# Patient Record
Sex: Male | Born: 1965 | Race: Black or African American | Hispanic: No | Marital: Married | Smoking: Current every day smoker
Health system: Southern US, Community
[De-identification: ages and names within clinical notes are randomized; demographics above are authoritative.]

## PROBLEM LIST (undated history)

## (undated) DIAGNOSIS — E119 Type 2 diabetes mellitus without complications: Secondary | ICD-10-CM

## (undated) DIAGNOSIS — E7800 Pure hypercholesterolemia, unspecified: Secondary | ICD-10-CM

## (undated) DIAGNOSIS — I10 Essential (primary) hypertension: Secondary | ICD-10-CM

## (undated) DIAGNOSIS — R1013 Epigastric pain: Secondary | ICD-10-CM

## (undated) DIAGNOSIS — M109 Gout, unspecified: Secondary | ICD-10-CM

## (undated) HISTORY — DX: Epigastric pain: R10.13

## (undated) HISTORY — PX: KNEE SURGERY: SHX244

## (undated) HISTORY — DX: Gout, unspecified: M10.9

## (undated) HISTORY — DX: Pure hypercholesterolemia, unspecified: E78.00

---

## 2004-09-09 ENCOUNTER — Other Ambulatory Visit: Payer: Self-pay

## 2004-09-09 ENCOUNTER — Emergency Department: Payer: Self-pay | Admitting: Emergency Medicine

## 2006-10-15 ENCOUNTER — Emergency Department: Payer: Self-pay | Admitting: Emergency Medicine

## 2007-06-16 ENCOUNTER — Emergency Department: Payer: Self-pay | Admitting: Emergency Medicine

## 2010-02-15 ENCOUNTER — Ambulatory Visit: Payer: Self-pay | Admitting: Family Medicine

## 2011-02-10 ENCOUNTER — Emergency Department: Payer: Self-pay | Admitting: Emergency Medicine

## 2015-11-30 ENCOUNTER — Ambulatory Visit: Payer: Self-pay | Admitting: Family Medicine

## 2015-12-02 DIAGNOSIS — E7800 Pure hypercholesterolemia, unspecified: Secondary | ICD-10-CM

## 2015-12-02 HISTORY — DX: Pure hypercholesterolemia, unspecified: E78.00

## 2015-12-25 ENCOUNTER — Ambulatory Visit: Payer: Self-pay | Admitting: Family Medicine

## 2016-01-08 ENCOUNTER — Ambulatory Visit: Payer: Self-pay | Admitting: Family Medicine

## 2016-04-09 DIAGNOSIS — R1032 Left lower quadrant pain: Secondary | ICD-10-CM | POA: Diagnosis not present

## 2016-04-09 DIAGNOSIS — F17200 Nicotine dependence, unspecified, uncomplicated: Secondary | ICD-10-CM | POA: Diagnosis not present

## 2016-04-09 DIAGNOSIS — R000 Tachycardia, unspecified: Secondary | ICD-10-CM | POA: Diagnosis not present

## 2016-04-09 DIAGNOSIS — L0231 Cutaneous abscess of buttock: Secondary | ICD-10-CM | POA: Diagnosis not present

## 2016-04-10 ENCOUNTER — Ambulatory Visit (INDEPENDENT_AMBULATORY_CARE_PROVIDER_SITE_OTHER): Payer: BLUE CROSS/BLUE SHIELD | Admitting: Family Medicine

## 2016-04-10 ENCOUNTER — Encounter: Payer: Self-pay | Admitting: Family Medicine

## 2016-04-10 VITALS — BP 122/79 | HR 114 | Temp 98.9°F | Resp 17 | Ht 71.0 in | Wt 235.2 lb

## 2016-04-10 DIAGNOSIS — R1030 Lower abdominal pain, unspecified: Secondary | ICD-10-CM

## 2016-04-10 DIAGNOSIS — R1032 Left lower quadrant pain: Secondary | ICD-10-CM

## 2016-04-10 NOTE — Progress Notes (Signed)
Name: Donald [name]   MRN: [id number]    DOB: 08/01/1966   Date:04/10/2016       Progress Note  Subjective  Chief Complaint  Chief Complaint  Patient presents with  . Hernia    HPI  Pt. Presents for 2 day history of left groin pain, seen yesterday at the Urgent Care and was advised to follow up with PCP.    History reviewed. No pertinent past medical history.  Past Surgical History  Procedure Laterality Date  . Knee surgery Right     History reviewed. No pertinent family history.  Social History   Social History  . Marital Status: Married    Spouse Name: N/A  . Number of Children: N/A  . Years of Education: N/A   Occupational History  . Not on file.   Social History Main Topics  . Smoking status: Current Every Day Smoker -- 1.00 packs/day    Types: Cigarettes  . Smokeless tobacco: Never Used  . Alcohol Use: Yes  . Drug Use: No  . Sexual Activity: Not on file   Other Topics Concern  . Not on file   Social History Narrative  . No narrative on file     Current outpatient prescriptions:  .  sulfamethoxazole-trimethoprim (BACTRIM DS,SEPTRA DS) 800-160 MG tablet, , Disp: , Rfl:   No Known Allergies   Review of Systems  Constitutional: Negative for fever, chills and malaise/fatigue.  Gastrointestinal: Negative for nausea, vomiting, abdominal pain and blood in stool.     Objective  Filed Vitals:   04/10/16 1451  BP: 122/79  Pulse: 114  Temp: 98.9 F (37.2 C)  TempSrc: Oral  Resp: 17  Height: 5\' 11"  (1.803 m)  Weight: 235 lb 3.2 oz (106.686 kg)  SpO2: 97%    Physical Exam  Constitutional: He is oriented to person, place, and time and well-developed, well-nourished, and in no distress.  Abdominal:  No palpable mass in the left groin,  Neurological: He is alert and oriented to person, place, and time.  Nursing note and vitals reviewed.    Assessment & Plan  1. Left groin pain Obtain ultrasound of the left groin area to rule out hernia.  If unremarkable, may proceed with x-ray of the hip to rule out arthritis. - US Pelvis Complete; Future   Emelyn Roen Asad A. Faylene KurtzShah [facility] 04/10/2016 3:08 PM

## 2016-07-22 ENCOUNTER — Emergency Department: Payer: BLUE CROSS/BLUE SHIELD

## 2016-07-22 ENCOUNTER — Inpatient Hospital Stay
Admission: EM | Admit: 2016-07-22 | Discharge: 2016-07-23 | DRG: 639 | Disposition: A | Discharge status: Home / Self Care | Payer: BLUE CROSS/BLUE SHIELD | Attending: Internal Medicine | Admitting: Internal Medicine

## 2016-07-22 ENCOUNTER — Encounter: Payer: Self-pay | Admitting: Medical Oncology

## 2016-07-22 DIAGNOSIS — E1165 Type 2 diabetes mellitus with hyperglycemia: Secondary | ICD-10-CM | POA: Diagnosis not present

## 2016-07-22 DIAGNOSIS — F17210 Nicotine dependence, cigarettes, uncomplicated: Secondary | ICD-10-CM | POA: Diagnosis present

## 2016-07-22 DIAGNOSIS — E1100 Type 2 diabetes mellitus with hyperosmolarity without nonketotic hyperglycemic-hyperosmolar coma (NKHHC): Secondary | ICD-10-CM | POA: Diagnosis not present

## 2016-07-22 DIAGNOSIS — I10 Essential (primary) hypertension: Secondary | ICD-10-CM | POA: Diagnosis present

## 2016-07-22 DIAGNOSIS — Z8249 Family history of ischemic heart disease and other diseases of the circulatory system: Secondary | ICD-10-CM | POA: Diagnosis not present

## 2016-07-22 DIAGNOSIS — E1101 Type 2 diabetes mellitus with hyperosmolarity with coma: Secondary | ICD-10-CM | POA: Diagnosis present

## 2016-07-22 DIAGNOSIS — E871 Hypo-osmolality and hyponatremia: Secondary | ICD-10-CM | POA: Diagnosis not present

## 2016-07-22 DIAGNOSIS — R51 Headache: Secondary | ICD-10-CM | POA: Diagnosis not present

## 2016-07-22 DIAGNOSIS — R42 Dizziness and giddiness: Secondary | ICD-10-CM | POA: Diagnosis not present

## 2016-07-22 HISTORY — DX: Essential (primary) hypertension: I10

## 2016-07-22 LAB — URINALYSIS COMPLETE WITH MICROSCOPIC (ARMC ONLY)
BACTERIA UA: NONE SEEN
Bilirubin Urine: NEGATIVE
Glucose, UA: >500 mg/dL — AB
Hgb urine dipstick: NEGATIVE
KETONES UR: NEGATIVE mg/dL
LEUKOCYTES UA: NEGATIVE
Nitrite: NEGATIVE
PH: 6 (ref 5.0–8.0)
PROTEIN: NEGATIVE mg/dL
RBC / HPF: NONE SEEN RBC/hpf (ref 0–5)
SQUAMOUS EPITHELIAL / LPF: NONE SEEN
Specific Gravity, Urine: 1.026 (ref 1.005–1.030)
WBC UA: NONE SEEN WBC/hpf (ref 0–5)

## 2016-07-22 LAB — COMPREHENSIVE METABOLIC PANEL
ALBUMIN: 3.9 g/dL (ref 3.5–5.0)
ALT: 20 U/L (ref 17–63)
AST: 20 U/L (ref 15–41)
Alkaline Phosphatase: 106 U/L (ref 38–126)
Anion gap: 10 (ref 5–15)
BUN: 15 mg/dL (ref 6–20)
CHLORIDE: 88 mmol/L — AB (ref 101–111)
CO2: 27 mmol/L (ref 22–32)
CREATININE: 1.19 mg/dL (ref 0.61–1.24)
Calcium: 9.8 mg/dL (ref 8.9–10.3)
GFR calc Af Amer: >60 mL/min (ref >60)
GFR calc non Af Amer: >60 mL/min (ref >60)
GLUCOSE: 917 mg/dL — AB (ref 65–99)
POTASSIUM: 4.9 mmol/L (ref 3.5–5.1)
Sodium: 125 mmol/L — ABNORMAL LOW (ref 135–145)
Total Bilirubin: 0.4 mg/dL (ref 0.3–1.2)
Total Protein: 8.1 g/dL (ref 6.5–8.1)

## 2016-07-22 LAB — CBC WITH DIFFERENTIAL/PLATELET
BASOS ABS: 0.1 10*3/uL (ref 0–0.1)
BASOS PCT: 1 %
EOS PCT: 1 %
Eosinophils Absolute: 0.1 10*3/uL (ref 0–0.7)
HCT: 42.2 % (ref 40.0–52.0)
Hemoglobin: 14.2 g/dL (ref 13.0–18.0)
LYMPHS PCT: 17 %
Lymphs Abs: 1.8 10*3/uL (ref 1.0–3.6)
MCH: 33.3 pg (ref 26.0–34.0)
MCHC: 33.6 g/dL (ref 32.0–36.0)
MCV: 99.1 fL (ref 80.0–100.0)
MONO ABS: 0.7 10*3/uL (ref 0.2–1.0)
Monocytes Relative: 7 %
NEUTROS ABS: 8 10*3/uL — AB (ref 1.4–6.5)
Neutrophils Relative %: 74 %
PLATELETS: 208 10*3/uL (ref 150–440)
RBC: 4.26 MIL/uL — AB (ref 4.40–5.90)
RDW: 14.2 % (ref 11.5–14.5)
WBC: 10.6 10*3/uL (ref 3.8–10.6)

## 2016-07-22 LAB — GLUCOSE, CAPILLARY
GLUCOSE-CAPILLARY: 268 mg/dL — AB (ref 65–99)
GLUCOSE-CAPILLARY: 292 mg/dL — AB (ref 65–99)
GLUCOSE-CAPILLARY: 150 mg/dL — AB (ref 65–99)
GLUCOSE-CAPILLARY: 301 mg/dL — AB (ref 65–99)
GLUCOSE-CAPILLARY: 282 mg/dL — AB (ref 65–99)
GLUCOSE-CAPILLARY: 215 mg/dL — AB (ref 65–99)
GLUCOSE-CAPILLARY: 182 mg/dL — AB (ref 65–99)
GLUCOSE-CAPILLARY: 131 mg/dL — AB (ref 65–99)
Glucose-Capillary: 108 mg/dL — ABNORMAL HIGH (ref 65–99)
Glucose-Capillary: 143 mg/dL — ABNORMAL HIGH (ref 65–99)
Glucose-Capillary: 167 mg/dL — ABNORMAL HIGH (ref 65–99)
Glucose-Capillary: 211 mg/dL — ABNORMAL HIGH (ref 65–99)
Glucose-Capillary: 404 mg/dL — ABNORMAL HIGH (ref 65–99)
Glucose-Capillary: 451 mg/dL — ABNORMAL HIGH (ref 65–99)
Glucose-Capillary: >600 mg/dL (ref 65–99)

## 2016-07-22 LAB — BASIC METABOLIC PANEL
Anion gap: 6 (ref 5–15)
BUN: 11 mg/dL (ref 6–20)
CHLORIDE: 103 mmol/L (ref 101–111)
CO2: 27 mmol/L (ref 22–32)
Calcium: 9 mg/dL (ref 8.9–10.3)
Creatinine, Ser: 0.74 mg/dL (ref 0.61–1.24)
GFR calc Af Amer: >60 mL/min (ref >60)
GFR calc non Af Amer: >60 mL/min (ref >60)
GLUCOSE: 159 mg/dL — AB (ref 65–99)
POTASSIUM: 3.6 mmol/L (ref 3.5–5.1)
SODIUM: 136 mmol/L (ref 135–145)

## 2016-07-22 LAB — URINE DRUG SCREEN, QUALITATIVE (ARMC ONLY)
Amphetamines, Ur Screen: NOT DETECTED
BARBITURATES, UR SCREEN: NOT DETECTED
BENZODIAZEPINE, UR SCRN: NOT DETECTED
Cannabinoid 50 Ng, Ur ~~LOC~~: NOT DETECTED
Cocaine Metabolite,Ur ~~LOC~~: NOT DETECTED
MDMA (Ecstasy)Ur Screen: NOT DETECTED
METHADONE SCREEN, URINE: NOT DETECTED
OPIATE, UR SCREEN: NOT DETECTED
Phencyclidine (PCP) Ur S: NOT DETECTED
Tricyclic, Ur Screen: NOT DETECTED

## 2016-07-22 LAB — TROPONIN I

## 2016-07-22 LAB — MRSA PCR SCREENING: MRSA by PCR: NEGATIVE

## 2016-07-22 LAB — ETHANOL

## 2016-07-22 LAB — HEMOGLOBIN A1C: HEMOGLOBIN A1C: 12 % — AB (ref 4.0–6.0)

## 2016-07-22 MED ORDER — ACETAMINOPHEN 650 MG RE SUPP: 650.0000 mg | Freq: Four times a day (QID) | RECTAL | Status: DC | PRN

## 2016-07-22 MED ORDER — SODIUM CHLORIDE 0.9 % IV SOLN
INTRAVENOUS | Status: DC
  Administered 2016-07-22: 5.4 [IU]/h via INTRAVENOUS
  Filled 2016-07-22: qty 2.5

## 2016-07-22 MED ORDER — POTASSIUM CHLORIDE 10 MEQ/100ML IV SOLN
10.0000 meq | INTRAVENOUS | Status: DC
  Filled 2016-07-22 (×2): qty 100

## 2016-07-22 MED ORDER — ALBUTEROL SULFATE (2.5 MG/3ML) 0.083% IN NEBU: 2.5000 mg | INHALATION_SOLUTION | RESPIRATORY_TRACT | Status: DC | PRN

## 2016-07-22 MED ORDER — ASPIRIN EC 81 MG PO TBEC
81.0000 mg | DELAYED_RELEASE_TABLET | Freq: Every day | ORAL | Status: DC
  Administered 2016-07-22 – 2016-07-23 (×2): 81 mg via ORAL
  Filled 2016-07-22 (×2): qty 1

## 2016-07-22 MED ORDER — SODIUM CHLORIDE 0.9 % IV BOLUS (SEPSIS)
1000.0000 mL | Freq: Once | INTRAVENOUS | Status: AC
  Administered 2016-07-22: 1000 mL via INTRAVENOUS

## 2016-07-22 MED ORDER — SODIUM CHLORIDE 0.9 % IV SOLN: INTRAVENOUS | Status: DC

## 2016-07-22 MED ORDER — ENOXAPARIN SODIUM 40 MG/0.4ML ~~LOC~~ SOLN
40.0000 mg | SUBCUTANEOUS | Status: DC
  Administered 2016-07-22: 40 mg via SUBCUTANEOUS
  Filled 2016-07-22: qty 0.4

## 2016-07-22 MED ORDER — POLYETHYLENE GLYCOL 3350 17 G PO PACK: 17.0000 g | PACK | Freq: Every day | ORAL | Status: DC | PRN

## 2016-07-22 MED ORDER — DEXTROSE-NACL 5-0.45 % IV SOLN
INTRAVENOUS | Status: DC
  Administered 2016-07-22: 16:00:00 via INTRAVENOUS

## 2016-07-22 MED ORDER — NICOTINE 21 MG/24HR TD PT24
21.0000 mg | MEDICATED_PATCH | Freq: Every day | TRANSDERMAL | Status: DC
  Administered 2016-07-22 – 2016-07-23 (×2): 21 mg via TRANSDERMAL
  Filled 2016-07-22 (×2): qty 1

## 2016-07-22 MED ORDER — ONDANSETRON HCL 4 MG PO TABS: 4.0000 mg | ORAL_TABLET | Freq: Four times a day (QID) | ORAL | Status: DC | PRN

## 2016-07-22 MED ORDER — SODIUM CHLORIDE 0.9% FLUSH
3.0000 mL | Freq: Two times a day (BID) | INTRAVENOUS | Status: DC
  Administered 2016-07-23: 3 mL via INTRAVENOUS

## 2016-07-22 MED ORDER — INSULIN REGULAR BOLUS VIA INFUSION
0.0000 [IU] | Freq: Three times a day (TID) | INTRAVENOUS | Status: DC
  Filled 2016-07-22: qty 10

## 2016-07-22 MED ORDER — SODIUM CHLORIDE 0.9 % IV SOLN: INTRAVENOUS | Status: AC

## 2016-07-22 MED ORDER — ACETAMINOPHEN 325 MG PO TABS: 650.0000 mg | ORAL_TABLET | Freq: Four times a day (QID) | ORAL | Status: DC | PRN

## 2016-07-22 MED ORDER — LISINOPRIL 10 MG PO TABS
10.0000 mg | ORAL_TABLET | Freq: Every day | ORAL | Status: DC
  Administered 2016-07-22: 10 mg via ORAL
  Filled 2016-07-22: qty 1

## 2016-07-22 MED ORDER — ONDANSETRON HCL 4 MG/2ML IJ SOLN: 4.0000 mg | Freq: Four times a day (QID) | INTRAMUSCULAR | Status: DC | PRN

## 2016-07-22 MED ORDER — SODIUM CHLORIDE 0.9 % IV SOLN
1000.0000 mL | Freq: Once | INTRAVENOUS | Status: AC
  Administered 2016-07-22: 1000 mL via INTRAVENOUS

## 2016-07-22 MED ORDER — METOCLOPRAMIDE HCL 5 MG/ML IJ SOLN
10.0000 mg | Freq: Once | INTRAMUSCULAR | Status: AC
  Administered 2016-07-22: 10 mg via INTRAVENOUS
  Filled 2016-07-22: qty 2

## 2016-07-22 MED ORDER — DEXTROSE-NACL 5-0.45 % IV SOLN: INTRAVENOUS | Status: DC

## 2016-07-22 MED ORDER — DEXTROSE 50 % IV SOLN: 25.0000 mL | INTRAVENOUS | Status: DC | PRN

## 2016-07-22 NOTE — H&P (Signed)
[facility] [place] [facility]   PATIENT NAME: Donald [name]    MR#:  [id number]  DATE OF BIRTH:  [birth year]  DATE OF ADMISSION:  07/22/2016  PRIMARY CARE PHYSICIAN: [facility]   REQUESTING/REFERRING PHYSICIAN: Dr. Mayford KnifeWilliams  CHIEF COMPLAINT:   Chief Complaint  Patient presents with  . Dizziness    HISTORY OF PRESENT ILLNESS:  Donald [name]  is a 50 y.o. male with a known history of Hypertension presents to the emergency room complaining of dizziness, blurred vision and not feeling well since morning. He has had increased urine output. No diagnosis of diabetes. No dysphagia or neuropathy or hematuria. He has diagnosis of hypertension but has not been on medications. He used to follow with Dr. Thana AtesMorrisey in the past but has not seen him in 3 years.  Today his blood sugars are found to be 917. Ketones negative.  PAST MEDICAL HISTORY:   Past Medical History:  Diagnosis Date  . Hypertension     PAST SURGICAL HISTORY:   Past Surgical History:  Procedure Laterality Date  . KNEE SURGERY Right     SOCIAL HISTORY:   Social History  Substance Use Topics  . Smoking status: Current Every Day Smoker    Packs/day: 1.00    Types: Cigarettes  . Smokeless tobacco: Never Used  . Alcohol use Yes    FAMILY HISTORY:   Family History  Problem Relation Age of Onset  . Hypertension Father     DRUG ALLERGIES:  No Known Allergies  REVIEW OF SYSTEMS:   Review of Systems  Constitutional: Positive for malaise/fatigue. Negative for chills and fever.  HENT: Negative for sore throat.   Eyes: Positive for blurred vision. Negative for double vision and pain.  Respiratory: Negative for cough, hemoptysis, shortness of breath and wheezing.   Cardiovascular: Negative for chest pain, palpitations, orthopnea and leg swelling.  Gastrointestinal: Negative for abdominal pain, constipation, diarrhea, heartburn, nausea and vomiting.   Genitourinary: Negative for dysuria and hematuria.  Musculoskeletal: Negative for back pain and joint pain.  Skin: Negative for rash.  Neurological: Positive for dizziness, sensory change and weakness. Negative for speech change, focal weakness and headaches.  Endo/Heme/Allergies: Does not bruise/bleed easily.  Psychiatric/Behavioral: Negative for depression. The patient is not nervous/anxious.     MEDICATIONS AT HOME:   Prior to Admission medications   Medication Sig Start Date End Date Taking? Authorizing Provider  methylPREDNISolone (MEDROL DOSEPAK) 4 MG TBPK tablet Take 4 mg by mouth as needed.    Yes Historical Provider, MD     VITAL SIGNS:  Blood pressure 112/69, pulse 81, temperature 97.5 F (36.4 C), temperature source Oral, resp. rate 20, height 5\' 11"  (1.803 m), weight 101.6 kg (224 lb), SpO2 100 %.  PHYSICAL EXAMINATION:  Physical Exam  GENERAL:  50 y.o.-year-old patient lying in the bed with no acute distress.  EYES: Pupils equal, round, reactive to light and accommodation. No scleral icterus. Extraocular muscles intact.  HEENT: Head atraumatic, normocephalic. Oropharynx and nasopharynx clear. No oropharyngeal erythema, moist oral mucosa  NECK:  Supple, no jugular venous distention. No thyroid enlargement, no tenderness.  LUNGS: Normal breath sounds bilaterally, no wheezing, rales, rhonchi. No use of accessory muscles of respiration.  CARDIOVASCULAR: S1, S2 normal. No murmurs, rubs, or gallops.  ABDOMEN: Soft, nontender, nondistended. Bowel sounds present. No organomegaly or mass.  EXTREMITIES: No pedal edema, cyanosis, or clubbing. + 2 pedal & radial pulses b/l.   NEUROLOGIC: Cranial nerves II through XII  are intact. No focal Motor or sensory deficits appreciated b/l PSYCHIATRIC: The patient is alert and oriented x 3. Good affect.  SKIN: No obvious rash, lesion, or ulcer.   LABORATORY PANEL:   CBC  Recent Labs Lab 07/22/16 0842  WBC 10.6  HGB 14.2  HCT 42.2   PLT 208   ------------------------------------------------------------------------------------------------------------------  Chemistries   Recent Labs Lab 07/22/16 0842  NA 125*  K 4.9  CL 88*  CO2 27  GLUCOSE 917*  BUN 15  CREATININE 1.19  CALCIUM 9.8  AST 20  ALT 20  ALKPHOS 106  BILITOT 0.4   ------------------------------------------------------------------------------------------------------------------  Cardiac Enzymes  Recent Labs Lab 07/22/16 0842  TROPONINI <0.03   ------------------------------------------------------------------------------------------------------------------  RADIOLOGY:  Ct Head Wo Contrast  Result Date: 07/22/2016 CLINICAL DATA:  Blurred vision noted this morning. Subsequent dizziness. Headache. EXAM: CT HEAD WITHOUT CONTRAST TECHNIQUE: Contiguous axial images were obtained from the base of the skull through the vertex without intravenous contrast. COMPARISON:  02/15/2010 FINDINGS: Brain: No evidence of malformation, atrophy, old or acute small or large vessel infarction, mass lesion, hemorrhage, hydrocephalus or extra-axial collection. No evidence of pituitary lesion. Vascular: No vascular calcification.  No hyperdense vessels. Skull: Normal.  No fracture or focal bone lesion. Sinuses/Orbits: Visualized sinuses are clear. No fluid in the middle ears or mastoids. Visualized orbits are normal. Other: None significant IMPRESSION: Normal head CT. Electronically Signed   By: Paulina FusiMark  Shogry M.D.   On: 07/22/2016 09:29     IMPRESSION AND PLAN:   * Hyperglycemic hyperosmolar state New Diagnoses of diabetes Will start insulin drip. Accu-Cheks every hour. Bolus normal saline. Continue maintenance fluids and switch to D5 normal saline once blood sugars less than 250. Check HbA1c. Insulin Lantus dose to be decided depending on his insulin drip rate prior to transition.  * Hypertension Patient not on medications at home. Start lisinopril  *  Pseudohyponatremia due to hyperglycemia  All the records are reviewed and case discussed with ED provider. Management plans discussed with the patient, family and they are in agreement.  CODE STATUS: FULL CODE  TOTAL CC TIME TAKING CARE OF THIS PATIENT: 40 minutes.   Milagros LollSudini, Almedia Cordell R M.D on 07/22/2016 at 11:53 AM  Between 7am to 6pm - Pager - [phone]  After 6pm go to [email or web address] - password [email or web address] [facility]  [facility]  Office  [phone]  CC: Primary care physician; [facility]  Note: This dictation was prepared with Dragon dictation along with smaller phrase technology. Any transcriptional errors that result from this process are unintentional.

## 2016-07-22 NOTE — ED Notes (Signed)
Admitting MD at bedside.

## 2016-07-22 NOTE — Care Management (Signed)
Patient is current with PCP however Dr. Thana AtesMorrisey is out on medical leave. Patient has been seen by Morrisey's partner Dr. Sherryll BurgerShah. Last seen by Dr. Sherryll BurgerShah in may, 2017 for "dizziness". Wife called their office asking for hospital follow up prior to coming to hospital. Appointment made for 07/30/16 9:45AM with Dr. Sherryll BurgerShah.

## 2016-07-22 NOTE — ED Notes (Signed)
CBG >600 MD notified. 

## 2016-07-22 NOTE — ED Notes (Signed)
Pt c/o CP, EKG done and [facility].

## 2016-07-22 NOTE — ED Notes (Signed)
MD at bedside. 

## 2016-07-22 NOTE — ED Provider Notes (Signed)
[facility] Emergency Department Provider Note        Time seen: ----------------------------------------- 8:47 AM on 07/22/2016 -----------------------------------------    I have reviewed the triage vital signs and the nursing notes.   HISTORY  Chief Complaint Dizziness    HPI Donald RichesBennie D [name] is a 50 y.o. male who presents to ER for dizziness and blurry vision. Patient states he woke up feeling fine this morning and then when he was driving to work he began having blurry vision in both eyes around 5:30. Felt dizzy, had a slight right-sided headache. Patient states he still feels dizzy, wife states he gets this way sometimes when his blood pressures elevated. Patient denies any recent illness, wife states he drinks alcohol.   Past Medical History:  Diagnosis Date  . Hypertension     Patient Active Problem List   Diagnosis Date Noted  . Left groin pain 04/10/2016    Past Surgical History:  Procedure Laterality Date  . KNEE SURGERY Right     Allergies Review of patient's allergies indicates no known allergies.  Social History Social History  Substance Use Topics  . Smoking status: Current Every Day Smoker    Packs/day: 1.00    Types: Cigarettes  . Smokeless tobacco: Never Used  . Alcohol use Yes    Review of Systems Constitutional: Negative for fever. Eyes: Positive for blurry vision Cardiovascular: Negative for chest pain. Respiratory: Negative for shortness of breath. Gastrointestinal: Negative for abdominal pain, vomiting and diarrhea. Genitourinary: Negative for dysuria. Musculoskeletal: Negative for back pain. Skin: Negative for rash. Neurological:Positive for dizziness, weakness, headache  10-point ROS otherwise negative.  ____________________________________________   PHYSICAL EXAM:  VITAL SIGNS: ED Triage Vitals  Enc Vitals Group     BP 07/22/16 0821 (!) 155/84     Pulse Rate 07/22/16 0821 99     Resp 07/22/16  0821 16     Temp 07/22/16 0821 97.5 F (36.4 C)     Temp Source 07/22/16 0821 Oral     SpO2 07/22/16 0821 98 %     Weight 07/22/16 0822 224 lb (101.6 kg)     Height 07/22/16 0822 5\' 11"  (1.803 m)     Head Circumference --      Peak Flow --      Pain Score 07/22/16 0827 4     Pain Loc --      Pain Edu? --      Excl. in GC? --     Constitutional: Alert and oriented. Well appearing and in no distress. Eyes: Conjunctivae are normal. PERRL. Normal extraocular movements. ENT   Head: Normocephalic and atraumatic.   Nose: No congestion/rhinnorhea.   Mouth/Throat: Mucous membranes are moist.   Neck: No stridor. Cardiovascular: Normal rate, regular rhythm. No murmurs, rubs, or gallops. Respiratory: Normal respiratory effort without tachypnea nor retractions. Breath sounds are clear and equal bilaterally. No wheezes/rales/rhonchi. Gastrointestinal: Soft and nontender. Normal bowel sounds Musculoskeletal: Nontender with normal range of motion in all extremities. No lower extremity tenderness nor edema. Neurologic:  Normal speech and language. No gross focal neurologic deficits are appreciated.  Skin:  Skin is warm, dry and intact. No rash noted. Psychiatric: Mood and affect are normal. Speech and behavior are normal.  ____________________________________________  EKG: Interpreted by me. Normal sinus rhythm with a rate of 95 bpm, normal PR interval, normal QRS, normal QT interval. Normal axis.  ____________________________________________  ED COURSE:  Pertinent labs & imaging results that were available during my care of the  patient were reviewed by me and considered in my medical decision making (see chart for details). Clinical Course  Patient presents to ER with dizziness of uncertain etiology. He will receive IV fluids, Reglan for headache and basic labs.  Procedures ____________________________________________   LABS (pertinent positives/negatives)  Labs Reviewed   CBC WITH DIFFERENTIAL/PLATELET - Abnormal; Notable for the following:       Result Value   RBC 4.26 (*)    Neutro Abs 8.0 (*)    All other components within normal limits  COMPREHENSIVE METABOLIC PANEL - Abnormal; Notable for the following:    Sodium 125 (*)    Chloride 88 (*)    Glucose, Bld 917 (*)    All other components within normal limits  URINALYSIS COMPLETEWITH MICROSCOPIC ([facility] ONLY) - Abnormal; Notable for the following:    Color, Urine COLORLESS (*)    APPearance CLEAR (*)    Glucose, UA >500 (*)    All other components within normal limits  GLUCOSE, CAPILLARY - Abnormal; Notable for the following:    Glucose-Capillary >600 (*)    All other components within normal limits  TROPONIN I  URINE DRUG SCREEN, QUALITATIVE ([facility] ONLY)  ETHANOL  CRITICAL CARE Performed by: Emily FilbertWilliams, Quanesha Klimaszewski E   Total critical care time: 30 minutes  Critical care time was exclusive of separately billable procedures and treating other patients.  Critical care was necessary to treat or prevent imminent or life-threatening deterioration.  Critical care was time spent personally by me on the following activities: development of treatment plan with patient and/or surrogate as well as nursing, discussions with consultants, evaluation of patient's response to treatment, examination of patient, obtaining history from patient or surrogate, ordering and performing treatments and interventions, ordering and review of laboratory studies, ordering and review of radiographic studies, pulse oximetry and re-evaluation of patient's condition.   RADIOLOGY  CT headIs unremarkable  ____________________________________________  FINAL ASSESSMENT AND PLAN  Hyperglycemia, hyperosmolar state, diabetes, dizziness, headache  Plan: Patient with labs and imaging as dictated above. Patient presents to ER with vague complaints of dizziness and headache. Blood sugar found to be markedly elevated with a normal anion  gap. He is in a hyperosmolar state. Serum osmolarity is 306. We will start him on a gentle insulin infusion, continue IV fluids and discussed with the hospitalist for admission. He will likely need stepdown ICU admission.   Emily FilbertWilliams, Tashe Purdon E, MD   Note: This dictation was prepared with Dragon dictation. Any transcriptional errors that result from this process are unintentional    Emily FilbertJonathan E Anaisa Radi, MD 07/22/16 [phone]

## 2016-07-22 NOTE — ED Triage Notes (Signed)
Pt reports he woke up fine this am and then while he was driving began having blurred vision to both eyes around 0530, pt after that began having dizziness. Pt also reports slight headache.

## 2016-07-22 NOTE — ED Notes (Signed)
Attempted to call report

## 2016-07-23 LAB — BASIC METABOLIC PANEL
ANION GAP: 7 (ref 5–15)
BUN: 11 mg/dL (ref 6–20)
CHLORIDE: 103 mmol/L (ref 101–111)
CO2: 26 mmol/L (ref 22–32)
Calcium: 8.3 mg/dL — ABNORMAL LOW (ref 8.9–10.3)
Creatinine, Ser: 0.69 mg/dL (ref 0.61–1.24)
GFR calc non Af Amer: >60 mL/min (ref >60)
Glucose, Bld: 196 mg/dL — ABNORMAL HIGH (ref 65–99)
Potassium: 3.4 mmol/L — ABNORMAL LOW (ref 3.5–5.1)
Sodium: 136 mmol/L (ref 135–145)

## 2016-07-23 LAB — GLUCOSE, CAPILLARY
GLUCOSE-CAPILLARY: 131 mg/dL — AB (ref 65–99)
GLUCOSE-CAPILLARY: 149 mg/dL — AB (ref 65–99)
GLUCOSE-CAPILLARY: 199 mg/dL — AB (ref 65–99)
GLUCOSE-CAPILLARY: 193 mg/dL — AB (ref 65–99)
GLUCOSE-CAPILLARY: 194 mg/dL — AB (ref 65–99)
GLUCOSE-CAPILLARY: 136 mg/dL — AB (ref 65–99)
GLUCOSE-CAPILLARY: 257 mg/dL — AB (ref 65–99)
Glucose-Capillary: 122 mg/dL — ABNORMAL HIGH (ref 65–99)
Glucose-Capillary: 178 mg/dL — ABNORMAL HIGH (ref 65–99)
Glucose-Capillary: 164 mg/dL — ABNORMAL HIGH (ref 65–99)

## 2016-07-23 MED ORDER — INSULIN ASPART 100 UNIT/ML ~~LOC~~ SOLN
0.0000 [IU] | Freq: Three times a day (TID) | SUBCUTANEOUS | Status: DC
  Administered 2016-07-23: 3 [IU] via SUBCUTANEOUS
  Administered 2016-07-23: 11 [IU] via SUBCUTANEOUS
  Filled 2016-07-23: qty 3
  Filled 2016-07-23 (×2): qty 15

## 2016-07-23 MED ORDER — LISINOPRIL 2.5 MG PO TABS: 2.5000 mg | ORAL_TABLET | Freq: Every day | ORAL | 0 refills | Status: DC

## 2016-07-23 MED ORDER — INSULIN ASPART 100 UNIT/ML ~~LOC~~ SOLN
4.0000 [IU] | Freq: Three times a day (TID) | SUBCUTANEOUS | Status: DC
  Administered 2016-07-23 (×2): 4 [IU] via SUBCUTANEOUS
  Filled 2016-07-23: qty 4

## 2016-07-23 MED ORDER — METFORMIN HCL 500 MG PO TABS: 500.0000 mg | ORAL_TABLET | Freq: Every day | ORAL | 0 refills | Status: DC

## 2016-07-23 MED ORDER — INSULIN DETEMIR 100 UNIT/ML FLEXPEN: 20.0000 [IU] | PEN_INJECTOR | Freq: Every day | SUBCUTANEOUS | 1 refills | Status: DC

## 2016-07-23 MED ORDER — D-CARE GLUCOMETER W/DEVICE KIT: 1.0000 [IU] | PACK | Freq: Three times a day (TID) | 0 refills | Status: AC

## 2016-07-23 MED ORDER — INSULIN STARTER KIT- PEN NEEDLES (ENGLISH)
1.0000 | Freq: Once | Status: AC
  Administered 2016-07-23: 1
  Filled 2016-07-23: qty 1

## 2016-07-23 MED ORDER — LIVING WELL WITH DIABETES BOOK
Freq: Once | Status: AC
  Administered 2016-07-23: 08:00:00
  Filled 2016-07-23 (×2): qty 1

## 2016-07-23 MED ORDER — SODIUM CHLORIDE 0.9 % IV SOLN
INTRAVENOUS | Status: DC
  Administered 2016-07-23: 07:00:00 via INTRAVENOUS

## 2016-07-23 MED ORDER — LISINOPRIL 5 MG PO TABS
2.5000 mg | ORAL_TABLET | Freq: Every day | ORAL | Status: DC
  Administered 2016-07-23: 2.5 mg via ORAL
  Filled 2016-07-23: qty 1

## 2016-07-23 MED ORDER — INSULIN ASPART 100 UNIT/ML ~~LOC~~ SOLN: 0.0000 [IU] | Freq: Every day | SUBCUTANEOUS | Status: DC

## 2016-07-23 MED ORDER — ASPIRIN 81 MG PO TBEC: 81.0000 mg | DELAYED_RELEASE_TABLET | Freq: Every day | ORAL | Status: AC

## 2016-07-23 MED ORDER — INSULIN GLARGINE 100 UNIT/ML ~~LOC~~ SOLN
12.0000 [IU] | SUBCUTANEOUS | Status: DC
  Administered 2016-07-23: 12 [IU] via SUBCUTANEOUS
  Filled 2016-07-23 (×3): qty 0.12

## 2016-07-23 NOTE — Plan of Care (Signed)
Problem: Food- and Nutrition-Related Knowledge Deficit (NB-1.1) Goal: Nutrition education Formal process to instruct or train a patient/client in a skill or to impart knowledge to help patients/clients voluntarily manage or modify food choices and eating behavior to maintain or improve health.  Outcome: Adequate for Discharge  RD consulted for nutrition education regarding diabetes. Newly dx diabetic.   Lab Results  Component Value Date   HGBA1C 12.0 (H) 07/22/2016    RD provided "Carbohydrate Counting for People with Diabetes" handout from the Academy of [employer] and Dietetics. Pt and wife both educated. Wife reports she has been educated previously on diabetic diet as she has dx of pre-diabetes. Discussed different food groups and their effects on blood sugar, emphasizing carbohydrate-containing foods. Provided list of carbohydrates and recommended serving sizes of common foods.  Discussed importance of controlled and consistent carbohydrate intake throughout the day. Basic carb counting and label reading reviewed. Provided examples of ways to balance meals/snacks and encouraged intake of high-fiber, whole grain complex carbohydrates. Teach back method used.  Expect good compliance. Recommend referral for outpatient education as well as pt newly dx DM.  Body mass index is 31.55 kg/m.   Current diet order is Diabetic/Heart Healthy, patient is consuming approximately 75-100% of meals at this time. Labs and medications reviewed. No further nutrition interventions warranted at this time. RD contact information provided. If additional nutrition issues arise, please re-consult RD.  Romelle Starcherate Brandan Glauber MS, RD, LDN [phone] Pager  [phone] Weekend/On-Call Pager

## 2016-07-23 NOTE — Discharge Instructions (Addendum)
Blood Glucose Monitoring, Adult Monitoring your blood glucose (also know as blood sugar) helps you to manage your diabetes. It also helps you and your health care provider monitor your diabetes and determine how well your treatment plan is working. WHY SHOULD YOU MONITOR YOUR BLOOD GLUCOSE?  It can help you understand how food, exercise, and medicine affect your blood glucose.  It allows you to know what your blood glucose is at any given moment. You can quickly tell if you are having low blood glucose (hypoglycemia) or high blood glucose (hyperglycemia).  It can help you and your health care provider know how to adjust your medicines.  It can help you understand how to manage an illness or adjust medicine for exercise. WHEN SHOULD YOU TEST? Your health care provider will help you decide how often you should check your blood glucose. This may depend on the type of diabetes you have, your diabetes control, or the types of medicines you are taking. Be sure to write down all of your blood glucose readings so that this information can be reviewed with your health care provider. See below for examples of testing times that your health care provider may suggest. Type 1 Diabetes  Test at least 2 times per day if your diabetes is well controlled, if you are using an insulin pump, or if you perform multiple daily injections.  If your diabetes is not well controlled or if you are sick, you may need to test more often.  It is a good idea to also test:  Before every insulin injection.  Before and after exercise.  Between meals and 2 hours after a meal.  Occasionally between 2:00 a.m. and 3:00 a.m. Type 2 Diabetes  If you are taking insulin, test at least 2 times per day. However, it is best to test before every insulin injection.  If you take medicines by mouth (orally), test 2 times a day.  If you are on a controlled diet, test once a day.  If your diabetes is not well controlled or if you  are sick, you may need to monitor more often. HOW TO MONITOR YOUR BLOOD GLUCOSE Supplies Needed  Blood glucose meter.  Test strips for your meter. Each meter has its own strips. You must use the strips that go with your own meter.  A pricking needle (lancet).  A device that holds the lancet (lancing device).  A journal or log book to write down your results. Procedure  Wash your hands with soap and water. Alcohol is not preferred.  Prick the side of your finger (not the tip) with the lancet.  Gently milk the finger until a small drop of blood appears.  Follow the instructions that come with your meter for inserting the test strip, applying blood to the strip, and using your blood glucose meter. Other Areas to Get Blood for Testing Some meters allow you to use other areas of your body (other than your finger) to test your blood. These areas are called alternative sites. The most common alternative sites are:  The forearm.  The thigh.  The back area of the lower leg.  The palm of the hand. The blood flow in these areas is slower. Therefore, the blood glucose values you get may be delayed, and the numbers are different from what you would get from your fingers. Do not use alternative sites if you think you are having hypoglycemia. Your reading will not be accurate. Always use a finger if you are  if you are having hypoglycemia. Also, if you cannot feel your lows (hypoglycemia unawareness), always use your fingers for your blood glucose checks. ADDITIONAL TIPS FOR GLUCOSE MONITORING  Do not reuse lancets.  Always carry your supplies with you.  All blood glucose meters have a 24-hour "hotline" number to call if you have questions or need help.  Adjust (calibrate) your blood glucose meter with a control solution after finishing a few boxes of strips. BLOOD GLUCOSE RECORD KEEPING It is a good idea to keep a daily record or log of your blood glucose readings. Most glucose meters, if not  all, keep your glucose records stored in the meter. Some meters come with the ability to download your records to your home computer. Keeping a record of your blood glucose readings is especially helpful if you are wanting to look for patterns. Make notes to go along with the blood glucose readings because you might forget what happened at that exact time. Keeping good records helps you and your health care provider to work together to achieve good diabetes management.    This information is not intended to replace advice given to you by your health care provider. Make sure you discuss any questions you have with your health care provider.   Document Released: 11/20/2003 Document Revised: 12/08/2014 Document Reviewed: 04/11/2013 Elsevier Interactive Patient Education 2016 Elsevier Inc.    How to Avoid Diabetes Problems You can do a lot to prevent or slow down diabetes problems. Following your diabetes plan and taking care of yourself can reduce your risk of serious or life-threatening complications. Below, you will find certain things you can do to prevent diabetes problems. MANAGE YOUR DIABETES Follow your health care provider's, nurse educator's, and dietitian's instructions for managing your diabetes. They will teach you the basics of diabetes care. They can help answer questions you may have. Learn about diabetes and make healthy choices regarding eating and physical activity. Monitor your blood glucose level regularly. Your health care provider will help you decide how often to check your blood glucose level depending on your treatment goals and how well you are meeting them.  DO NOT USE NICOTINE Nicotine and diabetes are a dangerous combination. Nicotine raises your risk for diabetes problems. If you quit using nicotine, you will lower your risk for heart attack, stroke, nerve disease, and kidney disease. Your cholesterol and your blood pressure levels may improve. Your blood circulation will  also improve. Do not use any tobacco products, including cigarettes, chewing tobacco, or electronic cigarettes. If you need help quitting, ask your health care provider. KEEP YOUR BLOOD PRESSURE UNDER CONTROL Your health care provider will determine your individualized target blood pressure based on your age, your medicines, how long you have had diabetes, and any other medical conditions you have. Blood pressure consists of two numbers. Generally, the goal is to keep your top number (systolic pressure) at or below 130, and your bottom number (diastolic pressure) at or below 80. Your health care provider may recommend a lower target blood pressure reading, if appropriate. Meal planning, medicines, and exercise can help you reach your target blood pressure. Make sure your health care provider checks your blood pressure at every visit. KEEP YOUR CHOLESTEROL UNDER CONTROL Normal cholesterol levels will help prevent heart disease and stroke. These are the biggest health problems for people with diabetes. Keeping cholesterol levels under control can also help with blood flow. Have your cholesterol level checked at least once a year. Your health care provider may   prescribe a medicine known as a statin. Statins lower your cholesterol. If you are not taking a statin, ask your health care provider if you should be. Meal planning, exercise, and medicines can help you reach your cholesterol targets.  SCHEDULE AND KEEP YOUR ANNUAL PHYSICAL EXAMS AND EYE EXAMS Your health care provider will tell you how often he or she wants to see you depending on your plan of treatment. It is important that you keep these appointments so that possible problems can be identified early and complications can be avoided or treated.  Every visit with your health care provider should include your weight, blood pressure, and an evaluation of your blood glucose control.  Your hemoglobin A1c should be checked:  At least twice a year if you  are at your goal.  Every 3 months if there are changes in treatment.  If you are not meeting your goals.  Your blood lipids should be checked yearly. You should also be checked yearly to see if you have protein in your urine (microalbumin).  Schedule a dilated eye exam within 5 years of your diagnosis if you have type 1 diabetes, and then yearly. Schedule a dilated eye exam at diagnosis if you have type 2 diabetes, and then yearly. All exams thereafter can be extended to every 2 to 3 years if one or more exams have been normal. KEEP YOUR VACCINES CURRENT It is recommended that you receive a flu (influenza) vaccine every year. It is also recommended that you receive a pneumonia (pneumococcal) vaccine. If you are 65 years of age or older and have never received a pneumonia vaccine, this vaccine may be given as a series of two separate shots. Ask your health care provider which additional vaccines may be recommended. TAKE CARE OF YOUR FEET  Diabetes may cause you to have a poor blood supply (circulation) to your legs and feet. Because of this, the skin may be thinner, break easier, and heal more slowly. You also may have nerve damage in your legs and feet, causing decreased feeling. You may not notice minor injuries to your feet that could lead to serious problems or infections. Taking care of your feet is very important. Visual foot exams are performed at every routine medical visit. The exams check for cuts, injuries, or other problems with the feet. A comprehensive foot exam should be done yearly. This includes visual inspection as well as assessing foot pulses and testing for loss of sensation. You should also do the following:  Inspect your feet daily for cuts, calluses, blisters, ingrown toenails, and signs of infection, such as redness, swelling, or pus.  Wash and dry your feet thoroughly, especially between the toes.  Avoid soaking your feet regularly in hot water baths.  Moisturize dry  skin with lotion, avoiding areas between your toes.  Cut toenails straight across and file the edges.  Avoid shoes that do not fit well or have areas that irritate your skin.  Avoid going barefooted or wearing only socks. Your feet need protection. TAKE CARE OF YOUR TEETH People with poorly controlled diabetes are more likely to have gum (periodontal) disease. These infections make diabetes harder to control. Periodontal diseases, if left untreated, can lead to tooth loss. Brush your teeth twice a day, floss, and see your dentist for checkups and cleaning every 6 months, or 2 times a year. ASK YOUR HEALTH CARE PROVIDER ABOUT TAKING ASPIRIN Taking aspirin daily is recommended to help prevent cardiovascular disease in people with and without   provider if this would benefit you and what dose he or she would recommend. DRINK RESPONSIBLY Moderate amounts of alcohol (less than 1 drink per day for adult women and less than 2 drinks per day for adult men) have a minimal effect on blood glucose if ingested with food. It is important to eat food with alcohol to avoid hypoglycemia. People should avoid alcohol if they have a history of alcohol abuse or dependence, if they are pregnant, and if they have liver disease, pancreatitis, advanced neuropathy, or severe hypertriglyceridemia. LESSEN STRESS Living with diabetes can be stressful. When you are under stress, your blood glucose may be affected in two ways:  Stress hormones may cause your blood glucose to rise.  You may be distracted from taking good care of yourself. It is a good idea to be aware of your stress level and make changes that are necessary to help you better manage challenging situations. Support groups, planned relaxation, a hobby you enjoy, meditation, healthy relationships, and exercise all work to lower your stress level. If your efforts do not seem to be helping, get help from your health care provider or a trained  mental health professional.   This information is not intended to replace advice given to you by your health care provider. Make sure you discuss any questions you have with your health care provider.   Document Released: 08/05/2011 Document Revised: 12/08/2014 Document Reviewed: 01/11/2014 Elsevier Interactive Patient Education 2016 Elsevier Inc.   Type 2 Diabetes Mellitus, Adult Type 2 diabetes mellitus is a long-term (chronic) disease. In type 2 diabetes:  The pancreas does not make enough of a hormone called insulin.  The cells in the body do not respond as well to the insulin that is made.  Both of the above can happen. Normally, insulin moves sugars from food into tissue cells. This gives you energy. If you have type 2 diabetes, sugars cannot be moved into tissue cells. This causes high blood sugar (hyperglycemia).  Your doctors will set personal treatment goals for you based on your age, your medicines, how long you have had diabetes, and any other medical conditions you have. Generally, the goal of treatment is to maintain the following blood glucose levels:  Before meals (preprandial): 80-130 mg/dL.  After meals (postprandial): below 180 mg/dL.  A1c: less than 6.5-7%. HOME CARE  Have your hemoglobin A1c level checked twice a year. The level shows if your diabetes is under control or out of control.  Test your blood sugar level every day as told by your doctor.  Check your ketone levels by testing your pee (urine) when you are sick and as told.  Take your diabetes or insulin medicine as told by your doctor.  Never run out of insulin.  Adjust how much insulin you give yourself based on how many carbs (carbohydrates) you eat. Carbs are in many foods, such as fruits, vegetables, whole grains, and dairy products.  Have a healthy snack between every healthy meal. Have 3 meals and 3 snacks a day.  Lose weight if you are overweight.  Carry a medical alert card or wear your  medical alert jewelry.  Carry a 15-gram carb snack with you at all times. Examples include:  Glucose pills, 3 or 4.  Glucose gel, 15-gram tube.  Raisins, 2 tablespoons (24 grams).  Jelly beans, 6.  Animal crackers, 8.  Regular (not diet) pop, 4 ounces (120 milliliters).  Gummy treats, 9.  Notice low blood sugar (hypoglycemia) symptoms, such as:  Shaking (tremors).  Trouble thinking clearly.  Sweating.  Faster heart rate.  Headache.  Dry mouth.  Hunger.  Crabbiness (irritability).  Being worried or tense (anxious).  Restless sleep.  A change in speech or coordination.  Confusion.  Treat low blood sugar right away. If you are alert and can swallow, follow the 15:15 rule:  Take 15-20 grams of a rapid-acting glucose or carb. This includes glucose gel, glucose pills, or 4 ounces (120 milliliters) of fruit juice, regular pop, or low-fat milk.  Check your blood sugar level 15 minutes after taking the glucose.  Take 15-20 grams more of glucose if the repeat blood sugar level is still 70 mg/dL (milligrams/deciliter) or below.  Eat a meal or snack within 1 hour of the blood sugar levels going back to normal.  Notice early symptoms of high blood sugar, such as:  Being really thirsty or drinking a lot (polydipsia).  Peeing a lot (polyuria).  Do at least 150 minutes of physical activity a week or as told.  Split the 150 minutes of activity up during the week. Do not do 150 minutes of activity in one day.  Perform exercises, such as weight lifting, at least 2 times a week or as told.  Spend no more than 90 minutes at one time inactive.  Adjust your insulin or food intake as needed if you start a new exercise or sport.  Follow your sick-day plan when you are not able to eat or drink as usual.  Do not smoke, chew tobacco, or use electronic cigarettes.  Women who are not pregnant should drink no more than 1 drink a day. Men should drink no more than 2 drinks a  day.  Only drink alcohol with food.  Ask your doctor if alcohol is safe for you.  Tell your doctor if you drink alcohol several times during the week.  See your doctor regularly.  Schedule an eye exam soon after you are told you have diabetes. Schedule exams once every year.  Check your skin and feet every day. Check for cuts, bruises, redness, nail problems, bleeding, blisters, or sores. A doctor should do a foot exam once a year.  Brush your teeth and gums twice a day. Floss once a day. Visit your dentist regularly.  Share your diabetes plan with your workplace or school.  Keep your shots that fight diseases (vaccines) up to date.  Get a flu (influenza) shot every year.  Get a pneumonia shot. If you are 52 years of age or older and you have never gotten a pneumonia shot, you might need to get two shots.  Ask your doctor which other shots you should get.  Learn how to deal with stress.  Get diabetes education and support as needed.  Ask your doctor for special help if:  You need help to maintain or improve how you do things on your own.  You need help to maintain or improve the quality of your life.  You have foot or hand problems.  You have trouble cleaning yourself, dressing, eating, or doing physical activity. GET HELP IF:  You are unable to eat or drink for more than 6 hours.  You feel sick to your stomach (nauseous) or throw up (vomit) for more than 6 hours.  Your blood sugar level is over 240 mg/dL.  There is a change in mental status.  You get another serious illness.  You have watery poop (diarrhea) for more than 6 hours.  You have been sick  or have had a fever for 2 or more days and are not getting better.  You have pain when you are active. GET HELP RIGHT AWAY IF:  You have trouble breathing.  Your ketone levels are higher than your doctor says they should be. MAKE SURE YOU:  Understand these instructions.  Will watch your condition.  Will  get help right away if you are not doing well or get worse.   This information is not intended to replace advice given to you by your health care provider. Make sure you discuss any questions you have with your health care provider.   Document Released: 08/26/2008 Document Revised: 04/03/2015 Document Reviewed: 06/18/2012 Elsevier Interactive Patient Education 2016 [employer] Diabetic diet  Activity as tolerated  Check Blood sugars 4 times a day(Before meals and at bedtime). Keep log and take to doctor's office.

## 2016-07-23 NOTE — Progress Notes (Signed)
Paged diabetes coordinator to inform of patient new diagnosis of diabetes and plan to discharge later today. Diabetes coordinator to arrange for campus assigned diabetes educator to speak with patient before discharge. Will continue to monitor patient.

## 2016-07-23 NOTE — Plan of Care (Signed)
Problem: Health Behavior/Discharge Planning: Goal: Ability to manage health-related needs will improve Outcome: Progressing Pt educated on importance of smoking cessation and blood glucose management.  Diabetic nurse coordinator consult in place for discharge education  Problem: Metabolic: Goal: Ability to maintain appropriate glucose levels will improve Outcome: Progressing Pt transitioned to phase 2 of protocol to transition off insulin drip

## 2016-07-23 NOTE — Progress Notes (Signed)
Inpatient Diabetes Program Recommendations  AACE/ADA: New Consensus Statement on Inpatient Glycemic Control (2015)  Target Ranges:  Prepandial:   less than 140 mg/dL      Peak postprandial:   less than 180 mg/dL (1-2 hours)      Critically ill patients:  140 - 180 mg/dL   Results for Donald, [name] (MRN [id number]) as of 07/23/2016 12:44  Ref. Range 07/23/2016 00:01 07/23/2016 01:02 07/23/2016 02:00 07/23/2016 02:54 07/23/2016 04:15 07/23/2016 05:09 07/23/2016 06:06 07/23/2016 06:58 07/23/2016 08:04 07/23/2016 08:36 07/23/2016 11:34  Glucose-Capillary Latest Ref Range: 65 - 99 mg/dL 108 (H) 122 (H) 131 (H) 149 (H) 178 (H) 199 (H) 193 (H) 194 (H) 164 (H) 136 (H) 257 (H)  Results for TARRELL, [name] (MRN [id number]) as of 07/23/2016 12:44  Ref. Range 07/22/2016 08:42  Hemoglobin A1C Latest Ref Range: 4.0 - 6.0 % 12.0 (H)  Glucose Latest Ref Range: 65 - 99 mg/dL 917 (HH)   Review of Glycemic Control  Diabetes history: No Outpatient Diabetes medications: NA Current orders for Inpatient glycemic control: Lantus 12 units Q24H, Novolog 0-20 units TID with meals, Novolog 0-5 units QHS, Novolog 4 units TID with meals for meal coverage  Inpatient Diabetes Program Recommendations: Insulin - Basal: Please consider increasing Lantus to 15 units Q24H. Insulin - Meal Coverage: Please consider increasing meal coverage to Novolog 6 units TID with meals. HgbA1C: A1C 12% on 07/22/16 indicating an average glucose of 298 mg/dl over the past 2-3 months. Recommend patient follow up with PCP regarding glycemic control by the end of this week if possible since new to insulin.  Spoke with patient and his wife about new diabetes diagnosis.  Discussed A1C results (12.0% on 07/22/16) and explained what an A1C is and informed patient that his current A1C indicates an average glucose of 298 mg/dl over the past 2-3 months. Patient reports that he found out he has a family history of diabetes on his fathers side of the family. Patient  reports that he has not been feeling well lately and had classic symptoms of hyperglycemia (fatigue, excessive thirst, frequent urination, sweating).  Discussed basic pathophysiology of DM Type 2, basic home care, importance of checking CBGs and maintaining good CBG control to prevent long-term and short-term complications. Reviewed glucose and A1C goals. Reviewed signs and symptoms of hyperglycemia and hypoglycemia along with treatment for both. Discussed impact of nutrition, exercise, stress, sickness, and medications on diabetes control. Reviewed Living Well with diabetes booklet and encouraged patient to read through entire book. Informed patient he will receive a Rx for glucometer and testing supplies and he needs to check with insurance to see if it is covered. If not, informed patient that he could go to [facility] to get the Reli-On Prime glucometer for $9 and a box of 50 Reli-On test strips for $9. Discussed Lantus and Novolog insulin in detail (how to take it, when to take it).  Asked patient to check his glucose 4 times per day (before meals and at bedtime) and to keep a log book of glucose readings and insulin taken. Explained how the doctor he follows up with can use the log book to continue to make insulin adjustments if needed. Discussed insulin administration with vial/syringe and insulin pens. Reviewed and demonstrated insulin administration with both vial/syringe and insulin pens.  Patient reports that he prefers to use insulin pens. Provided Lantus savings card which should reduce co-pay to $0 (if criteria met) and provided patient handout about going on the internet to  find Novolog savings card which will also reduce co-pay (if criteria is met) to $25. Encouraged patient to activate cards prior to going to the pharmacy.  Educated patient and spouse on insulin pen use at home. Reviewed contents of insulin flexpen starter kit. Reviewed all steps of insulin pen including attachment of needle, 2-unit  air shot, dialing up dose, giving injection, removing needle, disposal of sharps, storage of unused insulin, disposal of insulin etc. Patient able to provide successful return demonstration.  Informed patient that RN will be asking him to self-administer insulin to ensure proper technique and ability to administer self insulin shots.   Patient verbalized understanding of information discussed and he states that he has no further questions at this time related to diabetes.   RNs to provide ongoing basic DM education at bedside with this patient and engage patient to actively check blood glucose and administer insulin injections.    Will order outpatient diabetes education. Would recommend keeping patient another day to determine insulin needs as patient was just given initial Lantus dose at 6:36 am and glucose back up to 257 mg/dl prior to lunch. Anticipate patient will need basal, bolus, and correction scale insulin at discharge.   MD to give patient Rxs for: insulin pens (Lantus SoloStar and Novolog flexpen), insulin pen needles, glucometer and testing supplies.  Thanks, Donald Alderman, RN, MSN, CDE Diabetes Coordinator Inpatient Diabetes Program [phone] (Team Pager from [place] to [place]) [phone] ([place] office) [phone] [facility] office) [phone] [facility] office)

## 2016-07-23 NOTE — Progress Notes (Signed)
Patient discharged home via family members. Diabetes coordinator and dietician spoke with patient earlier in the day and provided educational materials and answered questions. I provided additional educational materials on discharge medications and instructions. I also had patient use the teach back method to assess competency. Patient was able to use teach back method to and had no further questions at this time. Doctor work release note was provided to patient per request.

## 2016-07-23 NOTE — Care Management (Signed)
Received call/message from patient's wife stating that nurse told her to call me. I attempted to contact her back but there was no answer.  There was no option to leave a message however she may have caller ID that may assist her to call me back [phone]. I have nothing to offer other than the appointment that was made with his PCP on 07/30/16. Please make sure patient gets Rx for glucometer and supplies. Cheapest glucometer can be purchased at [employer]. If wife calls me back I will tell her.

## 2016-07-23 NOTE — Progress Notes (Signed)
[facility]         [place], [place]   07/23/2016  Patient: Donald [name]   Date of Birth:  09/11/1966  Date of admission:  07/22/2016  Date of Discharge  07/23/2016    To Whom it May Concern:   Donald [name]  may return to work on 07/24/2016.  PHYSICAL ACTIVITY:  Full  If you have any questions or concerns, please don't hesitate to call.  Sincerely,   Milagros LollSudini, Aric Jost R M.D Pager [phone] Office : [phone]   .

## 2016-07-26 NOTE — Discharge Summary (Signed)
[place] [facility] [place]    MR#:  [id number]  DATE OF BIRTH:  10-Apr-1966  DATE OF ADMISSION:  07/22/2016 ADMITTING PHYSICIAN: Hillary Bow, MD  DATE OF DISCHARGE: 07/23/2016  4:30 PM  PRIMARY CARE PHYSICIAN: [place], [place]   ADMISSION DIAGNOSIS:  Uncontrolled type 2 DM with hyperosmolar nonketotic hyperglycemia (HCC) [E11.00]  DISCHARGE DIAGNOSIS:  Active Problems:   Hyperglycemic hyperosmolar nonketotic coma ([place])   SECONDARY DIAGNOSIS:   Past Medical History:  Diagnosis Date  . Hypertension      ADMITTING HISTORY  Donald [name]  is a 50 y.o. male with a known history of Hypertension presents to the emergency room complaining of dizziness, blurred vision and not feeling well since morning. He has had increased urine output. No diagnosis of diabetes. No dysphagia or neuropathy or hematuria. He has diagnosis of hypertension but has not been on medications. He used to follow with Dr. Rutherford Nail in the past but has not seen him in 3 years.  Today his blood sugars are found to be 917. Ketones negative.  HOSPITAL COURSE:   Patient admitted for hyperglycemic hyperosmolar state with new diagnosis of diabetes mellitus.  * Hyperglycemic hyperosmolar state - resolved New Diagnoses of diabetes Patient treated with aggressive fluid resuscitation. Initially on insulin drip with Accu-Cheks every hour. Later transitioned long-acting insulin. HbA1c return at 12. Patient is being discharged home on daily Lantus. Metformin. Isopto. Tocometer daily prescription given. Follow-up with primary care physician in one week.  * Hypertension Started low dose lisinopril.  * Pseudohyponatremia due to hyperglycemia Resolved  CONSULTS OBTAINED:    DRUG ALLERGIES:  No Known Allergies  DISCHARGE MEDICATIONS:   Discharge Medication List as of 07/23/2016  3:08 PM    START taking these medications   Details   aspirin EC 81 MG EC tablet Take 1 tablet (81 mg total) by mouth daily., Starting Wed 07/23/2016, OTC    Blood Glucose Monitoring Suppl (D-CARE GLUCOMETER) w/Device KIT 1 Units by Does not apply route 4 (four) times daily -  before meals and at bedtime., Starting Wed 07/23/2016, Normal    Insulin Detemir (LEVEMIR FLEXPEN) 100 UNIT/ML Pen Inject 20 Units into the skin daily at 10 pm., Starting Wed 07/23/2016, Normal    lisinopril (PRINIVIL,ZESTRIL) 2.5 MG tablet Take 1 tablet (2.5 mg total) by mouth daily., Starting Wed 07/23/2016, Normal    metFORMIN (GLUCOPHAGE) 500 MG tablet Take 1 tablet (500 mg total) by mouth daily with breakfast., Starting Wed 07/23/2016, Normal      STOP taking these medications     methylPREDNISolone (MEDROL DOSEPAK) 4 MG TBPK tablet         Today   VITAL SIGNS:  Blood pressure 121/74, pulse 91, temperature 98 F (36.7 C), temperature source Oral, resp. rate 18, height 5' 11"  (1.803 m), weight 102.6 kg (226 lb 3.1 oz), SpO2 98 %.  I/O:  No intake or output data in the 24 hours ending 07/26/16 1642  PHYSICAL EXAMINATION:  Physical Exam  GENERAL:  50 y.o.-year-old patient lying in the bed with no acute distress.  LUNGS: Normal breath sounds bilaterally, no wheezing, rales,rhonchi or crepitation. No use of accessory muscles of respiration.  CARDIOVASCULAR: S1, S2 normal. No murmurs, rubs, or gallops.  ABDOMEN: Soft, non-tender, non-distended. Bowel sounds present. No organomegaly or mass.  NEUROLOGIC: Moves all 4 extremities. PSYCHIATRIC: The patient is alert and oriented x 3.  SKIN: No obvious rash, lesion, or ulcer.  DATA REVIEW:   CBC  Recent Labs Lab 07/22/16 0842  WBC 10.6  HGB 14.2  HCT 42.2  PLT 208    Chemistries   Recent Labs Lab 07/22/16 0842  07/23/16 0421  NA 125*  < > 136  K 4.9  < > 3.4*  CL 88*  < > 103  CO2 27  < > 26  GLUCOSE 917*  < > 196*  BUN 15  < > 11  CREATININE 1.19  < > 0.69  CALCIUM 9.8  < > 8.3*  AST 20  --    --   ALT 20  --   --   ALKPHOS 106  --   --   BILITOT 0.4  --   --   < > = values in this interval not displayed.  Cardiac Enzymes  Recent Labs Lab 07/22/16 0842  TROPONINI <0.03    Microbiology Results  Results for orders placed or performed during the hospital encounter of 07/22/16  MRSA PCR Screening     Status: None   Collection Time: 07/22/16  2:15 PM  Result Value Ref Range Status   MRSA by PCR NEGATIVE NEGATIVE Final    Comment:        The GeneXpert MRSA Assay (FDA approved for NASAL specimens only), is one component of a comprehensive MRSA colonization surveillance program. It is not intended to diagnose MRSA infection nor to guide or monitor treatment for MRSA infections.     RADIOLOGY:  No results found.  Follow up with PCP in 1 week.  Management plans discussed with the patient, family and they are in agreement.  CODE STATUS:  Code Status History    Date Active Date Inactive Code Status Order ID Comments User Context   07/22/2016 10:16 AM 07/23/2016  8:25 PM Full Code [id number]  Hillary Bow, MD ED      TOTAL TIME TAKING CARE OF THIS PATIENT ON DAY OF DISCHARGE: more than 30 minutes.   Hillary Bow R M.D on 07/26/2016 at 4:42 PM  Between 7am to 6pm - Pager - [phone]  After 6pm go to [email or web address] - password [email or web address] [facility]  Office  [phone]  CC: Primary care physician; [name], PA  Note: This dictation was prepared with Dragon dictation along with smaller phrase technology. Any transcriptional errors that result from this process are unintentional.

## 2016-07-30 ENCOUNTER — Ambulatory Visit (INDEPENDENT_AMBULATORY_CARE_PROVIDER_SITE_OTHER): Payer: BLUE CROSS/BLUE SHIELD | Admitting: Family Medicine

## 2016-07-30 ENCOUNTER — Encounter: Payer: Self-pay | Admitting: Family Medicine

## 2016-07-30 DIAGNOSIS — E1100 Type 2 diabetes mellitus with hyperosmolarity without nonketotic hyperglycemic-hyperosmolar coma (NKHHC): Secondary | ICD-10-CM

## 2016-07-30 DIAGNOSIS — E1151 Type 2 diabetes mellitus with diabetic peripheral angiopathy without gangrene: Secondary | ICD-10-CM | POA: Insufficient documentation

## 2016-07-30 MED ORDER — METFORMIN HCL 500 MG PO TABS: 500.0000 mg | ORAL_TABLET | Freq: Two times a day (BID) | ORAL | 0 refills | Status: DC

## 2016-07-30 NOTE — Progress Notes (Signed)
Name: Donald [name]   MRN: [id number]    DOB: 1966-06-06   Date:07/30/2016       Progress Note  Subjective  Chief Complaint  Chief Complaint  Patient presents with  . Hospitalization Follow-up    Dizzy, elevated DM, BP    Diabetes  He presents for his initial diabetic visit. He has type 2 diabetes mellitus. His disease course has been improving. There are no hypoglycemic associated symptoms. Associated symptoms include fatigue, polydipsia and polyuria. Pertinent negatives for diabetes include no chest pain. (Improved since he was started on Insulin and Metformin, now not as thirsty and going to the bathroom less often.) Pertinent negatives for diabetic complications include no CVA, heart disease or peripheral neuropathy. Risk factors for coronary artery disease include diabetes mellitus and male sex. Current diabetic treatment includes oral agent (monotherapy) and intensive insulin program. He is following a diabetic diet. He has not had a previous visit with a dietitian. His breakfast blood glucose range is generally 140-180 mg/dl. His dinner blood glucose range is generally >200 mg/dl. An ACE inhibitor/angiotensin II receptor blocker is being taken.     Past Medical History:  Diagnosis Date  . Hypertension     Past Surgical History:  Procedure Laterality Date  . KNEE SURGERY Right     Family History  Problem Relation Age of Onset  . Hypertension Father     Social History   Social History  . Marital status: Married    Spouse name: N/A  . Number of children: N/A  . Years of education: N/A   Occupational History  . Not on file.   Social History Main Topics  . Smoking status: Current Every Day Smoker    Packs/day: 1.00    Types: Cigarettes  . Smokeless tobacco: Never Used  . Alcohol use Yes  . Drug use: No  . Sexual activity: Not on file   Other Topics Concern  . Not on file   Social History Narrative  . No narrative on file     Current Outpatient  Prescriptions:  .  aspirin EC 81 MG EC tablet, Take 1 tablet (81 mg total) by mouth daily., Disp: , Rfl:  .  Blood Glucose Monitoring Suppl (D-CARE GLUCOMETER) w/Device KIT, 1 Units by Does not apply route 4 (four) times daily -  before meals and at bedtime., Disp: 1 kit, Rfl: 0 .  Insulin Detemir (LEVEMIR FLEXPEN) 100 UNIT/ML Pen, Inject 20 Units into the skin daily at 10 pm., Disp: 15 mL, Rfl: 1 .  lisinopril (PRINIVIL,ZESTRIL) 2.5 MG tablet, Take 1 tablet (2.5 mg total) by mouth daily., Disp: 30 tablet, Rfl: 0 .  metFORMIN (GLUCOPHAGE) 500 MG tablet, Take 1 tablet (500 mg total) by mouth daily with breakfast., Disp: 30 tablet, Rfl: 0  No Known Allergies   Review of Systems  Constitutional: Positive for fatigue and malaise/fatigue. Negative for chills and fever.  Cardiovascular: Negative for chest pain.  Gastrointestinal: Negative for abdominal pain, nausea and vomiting.  Endo/Heme/Allergies: Positive for polydipsia.    Objective  Vitals:   07/30/16 0948  BP: 128/68  Pulse: 84  Resp: 18  Temp: 98.4 F (36.9 C)  TempSrc: Oral  SpO2: 96%  Weight: 224 lb 14.4 oz (102 kg)  Height: 5' 11"  (1.803 m)    Physical Exam  Constitutional: He is oriented to person, place, and time and well-developed, well-nourished, and in no distress.  HENT:  Head: Normocephalic and atraumatic.  Cardiovascular: Normal rate, regular rhythm, S1 normal,  S2 normal and normal heart sounds.   No murmur heard. Pulmonary/Chest: Effort normal and breath sounds normal. No respiratory distress. He has no wheezes. He has no rhonchi.  Abdominal: Soft. Bowel sounds are normal. There is no tenderness.  Musculoskeletal:       Right ankle: He exhibits no swelling.       Left ankle: He exhibits no swelling.  Neurological: He is alert and oriented to person, place, and time.  Psychiatric: Mood, memory, affect and judgment normal.  Nursing note and vitals reviewed.    Assessment & Plan  1. Uncontrolled type 2  diabetes mellitus with hyperosmolarity without coma, without long-term current use of insulin [facility]) ER notes and [facility] discharge summary reviewed. Increase metformin from once daily to twice daily, continue on Lantus 20 units, check lipid profile, urine microalbumin, and GFR. Encouraged on dietary compliance and to avoid sodas and concentrated sweets. Recheck in 3 months.  - Lipid Profile - COMPLETE METABOLIC PANEL WITH GFR - POCT UA - Microalbumin - metFORMIN (GLUCOPHAGE) 500 MG tablet; Take 1 tablet (500 mg total) by mouth 2 (two) times daily with a meal.  Dispense: 180 tablet; Refill: 0 - Ambulatory referral to Ophthalmology   Okey Dupre A. [facility] 07/30/2016 10:49 AM

## 2016-07-31 LAB — POCT UA - MICROALBUMIN
Albumin/Creatinine Ratio, Urine, POC: NEGATIVE
Creatinine, POC: NEGATIVE mg/dL
Microalbumin Ur, POC: NEGATIVE mg/L

## 2016-08-01 LAB — COMPLETE METABOLIC PANEL WITH GFR
ALT: 36 U/L (ref 9–46)
AST: 24 U/L (ref 10–35)
Albumin: 4.1 g/dL (ref 3.6–5.1)
Alkaline Phosphatase: 64 U/L (ref 40–115)
BUN: 12 mg/dL (ref 7–25)
CHLORIDE: 102 mmol/L (ref 98–110)
CO2: 27 mmol/L (ref 20–31)
Calcium: 9.4 mg/dL (ref 8.6–10.3)
Creat: 0.91 mg/dL (ref 0.70–1.33)
GLUCOSE: 118 mg/dL — AB (ref 65–99)
POTASSIUM: 4.3 mmol/L (ref 3.5–5.3)
SODIUM: 135 mmol/L (ref 135–146)
TOTAL PROTEIN: 7.3 g/dL (ref 6.1–8.1)
Total Bilirubin: 0.3 mg/dL (ref 0.2–1.2)

## 2016-08-01 LAB — LIPID PANEL
Cholesterol: 204 mg/dL — ABNORMAL HIGH (ref 125–200)
HDL: 42 mg/dL (ref >=40)
LDL CALC: 146 mg/dL — AB (ref <130)
TRIGLYCERIDES: 78 mg/dL (ref <150)
Total CHOL/HDL Ratio: 4.9 Ratio (ref <=5.0)
VLDL: 16 mg/dL (ref <30)

## 2016-08-12 ENCOUNTER — Ambulatory Visit: Payer: BLUE CROSS/BLUE SHIELD | Admitting: Family Medicine

## 2016-08-18 ENCOUNTER — Ambulatory Visit: Payer: BLUE CROSS/BLUE SHIELD | Admitting: *Deleted

## 2016-10-31 ENCOUNTER — Encounter: Payer: Self-pay | Admitting: Family Medicine

## 2016-10-31 ENCOUNTER — Ambulatory Visit (INDEPENDENT_AMBULATORY_CARE_PROVIDER_SITE_OTHER): Payer: BLUE CROSS/BLUE SHIELD | Admitting: Family Medicine

## 2016-10-31 VITALS — BP 118/80 | HR 94 | Temp 98.4°F | Resp 16 | Ht 71.0 in | Wt 215.6 lb

## 2016-10-31 DIAGNOSIS — Z794 Long term (current) use of insulin: Secondary | ICD-10-CM | POA: Diagnosis not present

## 2016-10-31 DIAGNOSIS — Z23 Encounter for immunization: Secondary | ICD-10-CM

## 2016-10-31 DIAGNOSIS — E119 Type 2 diabetes mellitus without complications: Secondary | ICD-10-CM | POA: Diagnosis not present

## 2016-10-31 DIAGNOSIS — E785 Hyperlipidemia, unspecified: Secondary | ICD-10-CM

## 2016-10-31 LAB — POCT GLYCOSYLATED HEMOGLOBIN (HGB A1C): Hemoglobin A1C: 6.4

## 2016-10-31 LAB — GLUCOSE, POCT (MANUAL RESULT ENTRY): POC Glucose: 167 mg/dl — AB (ref 70–99)

## 2016-10-31 MED ORDER — SIMVASTATIN 10 MG PO TABS: 10.0000 mg | ORAL_TABLET | Freq: Every day | ORAL | 0 refills | Status: DC

## 2016-10-31 MED ORDER — INSULIN DETEMIR 100 UNIT/ML FLEXPEN: 15.0000 [IU] | PEN_INJECTOR | Freq: Every day | SUBCUTANEOUS | 1 refills | Status: DC

## 2016-10-31 MED ORDER — LISINOPRIL 2.5 MG PO TABS: 2.5000 mg | ORAL_TABLET | Freq: Every day | ORAL | 0 refills | Status: DC

## 2016-10-31 NOTE — Progress Notes (Signed)
Name: Donald [name]   MRN: [id number]    DOB: 10-19-1966   Date:10/31/2016       Progress Note  Subjective  Chief Complaint  Chief Complaint  Patient presents with  . Diabetes    follow up  . Hypertension    patient stated he is not taking any BP medication    Diabetes  He presents for his follow-up diabetic visit. He has type 2 diabetes mellitus. His disease course has been improving. There are no hypoglycemic associated symptoms. Pertinent negatives for diabetes include no chest pain, no fatigue, no polydipsia and no polyuria. Risk factors for coronary artery disease include dyslipidemia, diabetes mellitus and male sex. Current diabetic treatment includes oral agent (monotherapy) and intensive insulin program. He is following a diabetic diet. His breakfast blood glucose range is generally 110-130 mg/dl. An ACE inhibitor/angiotensin II receptor blocker is being taken.      Past Medical History:  Diagnosis Date  . Hypertension     Past Surgical History:  Procedure Laterality Date  . KNEE SURGERY Right     Family History  Problem Relation Age of Onset  . Hypertension Father     Social History   Social History  . Marital status: Married    Spouse name: N/A  . Number of children: N/A  . Years of education: N/A   Occupational History  . Not on file.   Social History Main Topics  . Smoking status: Current Every Day Smoker    Packs/day: 1.00    Types: Cigarettes  . Smokeless tobacco: Never Used  . Alcohol use Yes  . Drug use: No  . Sexual activity: Not on file   Other Topics Concern  . Not on file   Social History Narrative  . No narrative on file     Current Outpatient Prescriptions:  .  aspirin EC 81 MG EC tablet, Take 1 tablet (81 mg total) by mouth daily., Disp: , Rfl:  .  Blood Glucose Monitoring Suppl (D-CARE GLUCOMETER) w/Device KIT, 1 Units by Does not apply route 4 (four) times daily -  before meals and at bedtime., Disp: 1 kit, Rfl: 0 .  Insulin  Detemir (LEVEMIR FLEXPEN) 100 UNIT/ML Pen, Inject 15 Units into the skin daily at 10 pm., Disp: 15 mL, Rfl: 1 .  metFORMIN (GLUCOPHAGE) 500 MG tablet, Take 1 tablet (500 mg total) by mouth 2 (two) times daily with a meal., Disp: 180 tablet, Rfl: 0 .  lisinopril (PRINIVIL,ZESTRIL) 2.5 MG tablet, Take 1 tablet (2.5 mg total) by mouth daily., Disp: 90 tablet, Rfl: 0 .  simvastatin (ZOCOR) 10 MG tablet, Take 1 tablet (10 mg total) by mouth daily., Disp: 90 tablet, Rfl: 0  No Known Allergies   Review of Systems  Constitutional: Negative for fatigue.  Cardiovascular: Negative for chest pain.  Endo/Heme/Allergies: Negative for polydipsia.      Objective  Vitals:   10/31/16 1013  BP: 118/80  Pulse: 94  Resp: 16  Temp: 98.4 F (36.9 C)  TempSrc: Oral  SpO2: 97%  Weight: 215 lb 9.6 oz (97.8 kg)  Height: 5' 11"  (1.803 m)    Physical Exam  Constitutional: He is oriented to person, place, and time and well-developed, well-nourished, and in no distress.  Cardiovascular: Normal rate, regular rhythm and normal heart sounds.   No murmur heard. Pulmonary/Chest: Effort normal and breath sounds normal. He has no wheezes.  Abdominal: Soft. Bowel sounds are normal.  Neurological: He is alert and oriented to person,  place, and time.  Psychiatric: Mood, memory, affect and judgment normal.  Nursing note and vitals reviewed.   Recent Results (from the past 2160 hour(s))  POCT HgB A1C     Status: None   Collection Time: 10/31/16  9:27 AM  Result Value Ref Range   Hemoglobin A1C 6.4   POCT Glucose (CBG)     Status: Abnormal   Collection Time: 10/31/16  9:27 AM  Result Value Ref Range   POC Glucose 167 (A) 70 - 99 mg/dl     Assessment & Plan  1. Type 2 diabetes mellitus without complication, with long-term current use of insulin (HCC) A1c 6.4%, Monopril glucose is 164 mg/dL. This is markedly improved from A1c of 12.0% in August 2017. Patient reportedly has quit drinking alcohol which was  the main reason his sugar went up so high. Will decrease Lantus to 15 units at bedtime, advised to check his blood glucose regularly, return in 3 months for review of logs. - POCT HgB A1C - POCT Glucose (CBG) - Insulin Detemir (LEVEMIR FLEXPEN) 100 UNIT/ML Pen; Inject 15 Units into the skin daily at 10 pm.  Dispense: 15 mL; Refill: 1 - lisinopril (PRINIVIL,ZESTRIL) 2.5 MG tablet; Take 1 tablet (2.5 mg total) by mouth daily.  Dispense: 90 tablet; Refill: 0  2. Hyperlipidemia, unspecified hyperlipidemia type Started on simvastatin 10 mg at bedtime for primary prevention - Lipid Profile - simvastatin (ZOCOR) 10 MG tablet; Take 1 tablet (10 mg total) by mouth daily.  Dispense: 90 tablet; Refill: 0  3. Need for immunization against influenza  - Flu Vaccine QUAD 36+ mos PF IM (Fluarix & Fluzone Quad PF)   Zong Mcquarrie Asad A. [facility] 10/31/2016 10:16 AM

## 2016-11-01 LAB — LIPID PANEL
CHOLESTEROL: 190 mg/dL (ref <200)
HDL: 39 mg/dL — ABNORMAL LOW (ref >40)
LDL CALC: 132 mg/dL — AB (ref <100)
Total CHOL/HDL Ratio: 4.9 Ratio (ref <5.0)
Triglycerides: 97 mg/dL (ref <150)
VLDL: 19 mg/dL (ref <30)

## 2016-11-20 ENCOUNTER — Other Ambulatory Visit: Payer: Self-pay | Admitting: Family Medicine

## 2016-11-20 DIAGNOSIS — E1100 Type 2 diabetes mellitus with hyperosmolarity without nonketotic hyperglycemic-hyperosmolar coma (NKHHC): Secondary | ICD-10-CM

## 2016-11-21 NOTE — Telephone Encounter (Signed)
Medication has been refilled and sent to [facility] [address] [address] 

## 2017-02-09 ENCOUNTER — Other Ambulatory Visit: Payer: Self-pay | Admitting: Family Medicine

## 2017-02-09 DIAGNOSIS — Z794 Long term (current) use of insulin: Principal | ICD-10-CM

## 2017-02-09 DIAGNOSIS — E119 Type 2 diabetes mellitus without complications: Secondary | ICD-10-CM

## 2017-02-09 DIAGNOSIS — E785 Hyperlipidemia, unspecified: Secondary | ICD-10-CM

## 2017-02-13 ENCOUNTER — Ambulatory Visit: Payer: BLUE CROSS/BLUE SHIELD | Admitting: Family Medicine

## 2017-02-20 ENCOUNTER — Ambulatory Visit (INDEPENDENT_AMBULATORY_CARE_PROVIDER_SITE_OTHER): Payer: BLUE CROSS/BLUE SHIELD | Admitting: Family Medicine

## 2017-02-20 ENCOUNTER — Encounter: Payer: Self-pay | Admitting: Family Medicine

## 2017-02-20 VITALS — BP 118/75 | HR 94 | Temp 98.7°F | Resp 17 | Ht 71.0 in | Wt 222.6 lb

## 2017-02-20 DIAGNOSIS — E1100 Type 2 diabetes mellitus with hyperosmolarity without nonketotic hyperglycemic-hyperosmolar coma (NKHHC): Secondary | ICD-10-CM

## 2017-02-20 DIAGNOSIS — E7800 Pure hypercholesterolemia, unspecified: Secondary | ICD-10-CM

## 2017-02-20 LAB — LIPID PANEL
CHOL/HDL RATIO: 3 ratio (ref <5.0)
Cholesterol: 152 mg/dL (ref <200)
HDL: 51 mg/dL (ref >40)
LDL CALC: 83 mg/dL (ref <100)
Triglycerides: 92 mg/dL (ref <150)
VLDL: 18 mg/dL (ref <30)

## 2017-02-20 LAB — GLUCOSE, POCT (MANUAL RESULT ENTRY): POC Glucose: 114 mg/dl — AB (ref 70–99)

## 2017-02-20 LAB — POCT GLYCOSYLATED HEMOGLOBIN (HGB A1C): Hemoglobin A1C: 6.5

## 2017-02-20 MED ORDER — METFORMIN HCL 500 MG PO TABS: 500.0000 mg | ORAL_TABLET | Freq: Two times a day (BID) | ORAL | 0 refills | Status: DC

## 2017-02-20 NOTE — Progress Notes (Signed)
Name: Donald [name]   MRN: [id number]    DOB: 11-24-1966   Date:02/20/2017       Progress Note  Subjective  Chief Complaint  Chief Complaint  Patient presents with  . Follow-up    3 mo  . Medication Refill    Diabetes  He presents for his follow-up diabetic visit. He has type 2 diabetes mellitus. His disease course has been improving. There are no hypoglycemic associated symptoms. Pertinent negatives for diabetes include no polydipsia and no polyuria. There are no diabetic complications. Risk factors for coronary artery disease include dyslipidemia, diabetes mellitus and male sex. Current diabetic treatment includes oral agent (monotherapy) and intensive insulin program. His weight is stable. He is following a diabetic diet. His breakfast blood glucose range is generally 110-130 mg/dl. An ACE inhibitor/angiotensin II receptor blocker is being taken. Eye exam is not current.  Hyperlipidemia  This is a chronic problem. The problem is uncontrolled. Recent lipid tests were reviewed and are high. Pertinent negatives include no leg pain, myalgias or shortness of breath. Current antihyperlipidemic treatment includes statins.    Past Medical History:  Diagnosis Date  . Hypertension     Past Surgical History:  Procedure Laterality Date  . KNEE SURGERY Right     Family History  Problem Relation Age of Onset  . Hypertension Father     Social History   Social History  . Marital status: Married    Spouse name: N/A  . Number of children: N/A  . Years of education: N/A   Occupational History  . Not on file.   Social History Main Topics  . Smoking status: Current Every Day Smoker    Packs/day: 1.00    Types: Cigarettes  . Smokeless tobacco: Never Used  . Alcohol use Yes  . Drug use: No  . Sexual activity: Not on file   Other Topics Concern  . Not on file   Social History Narrative  . No narrative on file     Current Outpatient Prescriptions:  .  aspirin EC 81 MG EC  tablet, Take 1 tablet (81 mg total) by mouth daily., Disp: , Rfl:  .  Blood Glucose Monitoring Suppl (D-CARE GLUCOMETER) w/Device KIT, 1 Units by Does not apply route 4 (four) times daily -  before meals and at bedtime., Disp: 1 kit, Rfl: 0 .  Insulin Detemir (LEVEMIR FLEXPEN) 100 UNIT/ML Pen, Inject 15 Units into the skin daily at 10 pm., Disp: 15 mL, Rfl: 1 .  lisinopril (PRINIVIL,ZESTRIL) 2.5 MG tablet, TAKE 1 TABLET (2.5 MG TOTAL) BY MOUTH DAILY., Disp: 90 tablet, Rfl: 0 .  metFORMIN (GLUCOPHAGE) 500 MG tablet, TAKE 1 TABLET (500 MG TOTAL) BY MOUTH 2 (TWO) TIMES DAILY WITH A MEAL., Disp: 180 tablet, Rfl: 0 .  simvastatin (ZOCOR) 10 MG tablet, TAKE 1 TABLET (10 MG TOTAL) BY MOUTH DAILY., Disp: 90 tablet, Rfl: 0  No Known Allergies   Review of Systems  Respiratory: Negative for shortness of breath.   Musculoskeletal: Negative for myalgias.  Endo/Heme/Allergies: Negative for polydipsia.     Objective  Vitals:   02/20/17 0945  BP: 118/75  Pulse: 94  Resp: 17  Temp: 98.7 F (37.1 C)  TempSrc: Oral  SpO2: 98%  Weight: 222 lb 9.6 oz (101 kg)  Height: _0  (1.803 m)    Physical Exam  Constitutional: He is oriented to person, place, and time and well-developed, well-nourished, and in no distress.  Cardiovascular: Normal rate, regular rhythm and normal heart  sounds.   No murmur heard. Pulmonary/Chest: Effort normal and breath sounds normal. He has no wheezes.  Abdominal: Soft. Bowel sounds are normal.  Musculoskeletal: He exhibits no edema.  Neurological: He is alert and oriented to person, place, and time.  Psychiatric: Mood, memory, affect and judgment normal.  Nursing note and vitals reviewed.     Recent Results (from the past 2160 hour(s))  POCT HgB A1C     Status: Normal   Collection Time: 02/20/17  9:51 AM  Result Value Ref Range   Hemoglobin A1C 6.5   POCT Glucose (CBG)     Status: Abnormal   Collection Time: 02/20/17  9:51 AM  Result Value Ref Range   POC  Glucose 114 (A) 70 - 99 mg/dl     Assessment & Plan  1. Uncontrolled type 2 diabetes mellitus with hyperosmolarity without coma, without long-term current use of insulin (HCC)   A1c 6.4%, well-controlled diabetes, continue on metformin at 500 mg twice a day and insulin at 15 units at bedtime, refills provided - POCT HgB A1C - POCT Glucose (CBG) - metFORMIN (GLUCOPHAGE) 500 MG tablet; Take 1 tablet (500 mg total) by mouth 2 (two) times daily with a meal.  Dispense: 180 tablet; Refill: 0  2. Pure hypercholesterolemia Expect marked improvement in fasting lipid profile, he has been started on low-dose statin - Lipid panel   Adhrit Krenz Asad A. [facility] 02/20/2017 9:56 AM

## 2017-02-23 ENCOUNTER — Other Ambulatory Visit: Payer: Self-pay | Admitting: Family Medicine

## 2017-02-23 DIAGNOSIS — E1100 Type 2 diabetes mellitus with hyperosmolarity without nonketotic hyperglycemic-hyperosmolar coma (NKHHC): Secondary | ICD-10-CM

## 2017-02-24 NOTE — Telephone Encounter (Signed)
Medication has been refilled on 02/20/2017 and sent to [facility] [address] [address]

## 2017-03-20 ENCOUNTER — Ambulatory Visit: Payer: BLUE CROSS/BLUE SHIELD | Admitting: Family Medicine

## 2017-03-27 ENCOUNTER — Ambulatory Visit (INDEPENDENT_AMBULATORY_CARE_PROVIDER_SITE_OTHER): Payer: BLUE CROSS/BLUE SHIELD | Admitting: Family Medicine

## 2017-03-27 ENCOUNTER — Encounter: Payer: Self-pay | Admitting: Family Medicine

## 2017-03-27 VITALS — BP 124/80 | HR 73 | Temp 98.3°F | Resp 16 | Ht 71.0 in | Wt 223.7 lb

## 2017-03-27 DIAGNOSIS — Z125 Encounter for screening for malignant neoplasm of prostate: Secondary | ICD-10-CM | POA: Diagnosis not present

## 2017-03-27 DIAGNOSIS — Z1211 Encounter for screening for malignant neoplasm of colon: Secondary | ICD-10-CM

## 2017-03-27 DIAGNOSIS — Z1159 Encounter for screening for other viral diseases: Secondary | ICD-10-CM

## 2017-03-27 DIAGNOSIS — Z0000 Encounter for general adult medical examination without abnormal findings: Secondary | ICD-10-CM

## 2017-03-27 LAB — CBC WITH DIFFERENTIAL/PLATELET
Basophils Absolute: 101 cells/uL (ref 0–200)
Basophils Relative: 1 %
EOS ABS: 202 {cells}/uL (ref 15–500)
EOS PCT: 2 %
HCT: 42.8 % (ref 38.5–50.0)
Hemoglobin: 14.2 g/dL (ref 13.2–17.1)
LYMPHS ABS: 2727 {cells}/uL (ref 850–3900)
Lymphocytes Relative: 27 %
MCH: 31.6 pg (ref 27.0–33.0)
MCHC: 33.2 g/dL (ref 32.0–36.0)
MCV: 95.1 fL (ref 80.0–100.0)
MONO ABS: 707 {cells}/uL (ref 200–950)
MPV: 10.7 fL (ref 7.5–12.5)
Monocytes Relative: 7 %
NEUTROS ABS: 6363 {cells}/uL (ref 1500–7800)
NEUTROS PCT: 63 %
Platelets: 294 10*3/uL (ref 140–400)
RBC: 4.5 MIL/uL (ref 4.20–5.80)
RDW: 14.6 % (ref 11.0–15.0)
WBC: 10.1 10*3/uL (ref 3.8–10.8)

## 2017-03-27 LAB — PSA: PSA: 1 ng/mL (ref <=4.0)

## 2017-03-27 LAB — TSH: TSH: 0.44 m[IU]/L (ref 0.40–4.50)

## 2017-03-27 NOTE — Progress Notes (Signed)
Name: Donald [name]   MRN: [id number]    DOB: May 10, 1966   Date:03/27/2017       Progress Note  Subjective  Chief Complaint  Chief Complaint  Patient presents with  . Follow-up    patient is here for his 1 month f/u  . Immunizations    pneumococcal    HPI  Pt. Presents for Complete Physical Exam. He is due for PSA and DRE. Had a colonoscopy few years ago, no records available.    No past medical history on file.  Past Surgical History:  Procedure Laterality Date  . KNEE SURGERY Right     Family History  Problem Relation Age of Onset  . Hypertension Father     Social History   Social History  . Marital status: Married    Spouse name: N/A  . Number of children: N/A  . Years of education: N/A   Occupational History  . Not on file.   Social History Main Topics  . Smoking status: Current Every Day Smoker    Packs/day: 1.00    Types: Cigarettes  . Smokeless tobacco: Never Used  . Alcohol use Yes  . Drug use: No  . Sexual activity: Yes    Partners: Female   Other Topics Concern  . Not on file   Social History Narrative  . No narrative on file     Current Outpatient Prescriptions:  .  aspirin EC 81 MG EC tablet, Take 1 tablet (81 mg total) by mouth daily., Disp: , Rfl:  .  Blood Glucose Monitoring Suppl (D-CARE GLUCOMETER) w/Device KIT, 1 Units by Does not apply route 4 (four) times daily -  before meals and at bedtime., Disp: 1 kit, Rfl: 0 .  Insulin Detemir (LEVEMIR FLEXPEN) 100 UNIT/ML Pen, Inject 15 Units into the skin daily at 10 pm., Disp: 15 mL, Rfl: 1 .  lisinopril (PRINIVIL,ZESTRIL) 2.5 MG tablet, TAKE 1 TABLET (2.5 MG TOTAL) BY MOUTH DAILY., Disp: 90 tablet, Rfl: 0 .  metFORMIN (GLUCOPHAGE) 500 MG tablet, Take 1 tablet (500 mg total) by mouth 2 (two) times daily with a meal., Disp: 180 tablet, Rfl: 0 .  simvastatin (ZOCOR) 10 MG tablet, TAKE 1 TABLET (10 MG TOTAL) BY MOUTH DAILY., Disp: 90 tablet, Rfl: 0  No Known Allergies   Review of  Systems  Constitutional: Negative for chills, fever and malaise/fatigue.  HENT: Positive for nosebleeds (woke up with a nsoebleed). Negative for congestion, ear pain and sore throat.   Eyes: Negative for blurred vision and double vision.  Respiratory: Negative for cough, sputum production and shortness of breath.   Cardiovascular: Negative for chest pain and palpitations.  Gastrointestinal: Negative for blood in stool, constipation and diarrhea.  Genitourinary: Negative for dysuria and hematuria.  Musculoskeletal: Negative for back pain, joint pain and neck pain.  Skin: Positive for rash (lesion on left forearm for 1 week, does not itch). Negative for itching.  Neurological: Negative for dizziness and headaches.  Psychiatric/Behavioral: Negative for depression. The patient is not nervous/anxious and does not have insomnia.     Objective  Vitals:   03/27/17 1004  BP: 124/80  Pulse: 73  Resp: 16  Temp: 98.3 F (36.8 C)  TempSrc: Oral  SpO2: 95%  Weight: 223 lb 11.2 oz (101.5 kg)  Height: _0  (1.803 m)    Physical Exam  Constitutional: He is oriented to person, place, and time and well-developed, well-nourished, and in no distress.  HENT:  Head: Normocephalic and atraumatic.  Right Ear: External ear normal.  Left Ear: External ear normal.  Mouth/Throat: Oropharynx is clear and moist.  Eyes: Pupils are equal, round, and reactive to light.  Cardiovascular: Normal rate, regular rhythm and normal heart sounds.   No murmur heard. Pulmonary/Chest: Effort normal and breath sounds normal. He has no wheezes.  Abdominal: Soft. Bowel sounds are normal. There is no tenderness.  Genitourinary: Prostate normal. Prostate is not enlarged and not tender.  Musculoskeletal:       Right ankle: He exhibits no swelling.       Left ankle: He exhibits no swelling.  Neurological: He is alert and oriented to person, place, and time.  Skin: Skin is warm, dry and intact.  Psychiatric: Mood,  memory, affect and judgment normal.  Nursing note and vitals reviewed.     Assessment & Plan  1. Annual physical exam  - CBC with Differential/Platelet - TSH - VITAMIN D 25 Hydroxy (Vit-D Deficiency, Fractures)  2. Need for hepatitis C screening test  - Hepatitis C antibody  3. Screening for prostate cancer  - PSA  4. Screening for colon cancer  - Cologuard   Mellody Masri [facility] 03/27/2017 10:14 AM

## 2017-03-28 LAB — VITAMIN D 25 HYDROXY (VIT D DEFICIENCY, FRACTURES): Vit D, 25-Hydroxy: 12 ng/mL — ABNORMAL LOW (ref 30–100)

## 2017-03-28 LAB — HEPATITIS C ANTIBODY: HCV AB: NEGATIVE

## 2017-03-31 ENCOUNTER — Telehealth: Payer: Self-pay

## 2017-03-31 MED ORDER — VITAMIN D (ERGOCALCIFEROL) 1.25 MG (50000 UNIT) PO CAPS: 50000.0000 [IU] | ORAL_CAPSULE | ORAL | 0 refills | Status: DC

## 2017-03-31 NOTE — Telephone Encounter (Signed)
Patient has been notified of lab results and a prescription for vitamin D3 50,000 units take 1 capsule once a week x12 weeks has been sent to [facility] [address] per Dr. Shah, patient has been notified  

## 2017-04-22 ENCOUNTER — Emergency Department
Admission: EM | Admit: 2017-04-22 | Discharge: 2017-04-22 | Disposition: A | Discharge status: Home / Self Care | Payer: BLUE CROSS/BLUE SHIELD | Attending: Emergency Medicine | Admitting: Emergency Medicine

## 2017-04-22 ENCOUNTER — Encounter: Payer: Self-pay | Admitting: Emergency Medicine

## 2017-04-22 ENCOUNTER — Emergency Department: Payer: BLUE CROSS/BLUE SHIELD

## 2017-04-22 DIAGNOSIS — M25561 Pain in right knee: Secondary | ICD-10-CM | POA: Diagnosis not present

## 2017-04-22 DIAGNOSIS — M1711 Unilateral primary osteoarthritis, right knee: Secondary | ICD-10-CM | POA: Insufficient documentation

## 2017-04-22 DIAGNOSIS — Z7984 Long term (current) use of oral hypoglycemic drugs: Secondary | ICD-10-CM | POA: Diagnosis not present

## 2017-04-22 DIAGNOSIS — E119 Type 2 diabetes mellitus without complications: Secondary | ICD-10-CM | POA: Insufficient documentation

## 2017-04-22 DIAGNOSIS — Z7982 Long term (current) use of aspirin: Secondary | ICD-10-CM | POA: Insufficient documentation

## 2017-04-22 DIAGNOSIS — F17210 Nicotine dependence, cigarettes, uncomplicated: Secondary | ICD-10-CM | POA: Insufficient documentation

## 2017-04-22 MED ORDER — MELOXICAM 7.5 MG PO TABS: 7.5000 mg | ORAL_TABLET | Freq: Every day | ORAL | 1 refills | Status: AC

## 2017-04-22 NOTE — ED Provider Notes (Signed)
[facility] Emergency Department Provider Note  ____________________________________________  Time seen: Approximately 10:22 PM  I have reviewed the triage vital signs and the nursing notes.   HISTORY  Chief Complaint Leg Pain    HPI Donald [name] is a 51 y.o. male presenting to the emergency department with aching 8/10 right knee pain for one day. Patient has also had associated instability. Patient denies radiculopathy and weakness. He denies incidences of trauma. Patient states that he had an arthroscopy of the right knee approximately 40 years ago. He has tried "arthritis cream" but no other alleviating measures.    History reviewed. No pertinent past medical history.  Patient Active Problem List   Diagnosis Date Noted  . Screening for prostate cancer 03/27/2017  . Need for hepatitis C screening test 03/27/2017  . Hyperlipidemia 10/31/2016  . Diabetes mellitus type 2 with hyperosmolarity, uncontrolled ([place]) 07/30/2016  . Left groin pain 04/10/2016    Past Surgical History:  Procedure Laterality Date  . KNEE SURGERY Right     Prior to Admission medications   Medication Sig Start Date End Date Taking? Authorizing Provider  aspirin EC 81 MG EC tablet Take 1 tablet (81 mg total) by mouth daily. 07/23/16   Hillary Bow, MD  Blood Glucose Monitoring Suppl (D-CARE GLUCOMETER) w/Device KIT 1 Units by Does not apply route 4 (four) times daily -  before meals and at bedtime. 07/23/16   Hillary Bow, MD  Insulin Detemir (LEVEMIR FLEXPEN) 100 UNIT/ML Pen Inject 15 Units into the skin daily at 10 pm. 10/31/16   Roselee Nova, MD  lisinopril (PRINIVIL,ZESTRIL) 2.5 MG tablet TAKE 1 TABLET (2.5 MG TOTAL) BY MOUTH DAILY. 02/10/17   Roselee Nova, MD  meloxicam (MOBIC) 7.5 MG tablet Take 1 tablet (7.5 mg total) by mouth daily. 04/22/17 04/29/17  Lannie Fields, PA-C  metFORMIN (GLUCOPHAGE) 500 MG tablet Take 1 tablet (500 mg total) by mouth 2 (two) times  daily with a meal. 02/20/17   Roselee Nova, MD  simvastatin (ZOCOR) 10 MG tablet TAKE 1 TABLET (10 MG TOTAL) BY MOUTH DAILY. 02/10/17   Roselee Nova, MD  Vitamin D, Ergocalciferol, (DRISDOL) 50000 units CAPS capsule Take 1 capsule (50,000 Units total) by mouth once a week. For 12 weeks 03/31/17   Roselee Nova, MD    Allergies Patient has no known allergies.  Family History  Problem Relation Age of Onset  . Hypertension Father     Social History Social History  Substance Use Topics  . Smoking status: Current Every Day Smoker    Packs/day: 1.00    Types: Cigarettes  . Smokeless tobacco: Never Used  . Alcohol use Yes     Review of Systems  Constitutional: No fever/chills Eyes: No visual changes. No discharge ENT: No upper respiratory complaints. Cardiovascular: no chest pain. Respiratory: no cough. No SOB. Gastrointestinal: No abdominal pain.  No nausea, no vomiting.  No diarrhea.  No constipation. Musculoskeletal: Patient has right knee pain.  Skin: Negative for rash, abrasions, lacerations, ecchymosis. Neurological: Negative for headaches, focal weakness or numbness. ____________________________________________   PHYSICAL EXAM:  VITAL SIGNS: ED Triage Vitals [04/22/17 2027]  Enc Vitals Group     BP (!) 142/74     Pulse Rate 96     Resp 16     Temp 97.7 F (36.5 C)     Temp Source Oral     SpO2 99 %     Weight  223 lb (101.2 kg)     Height 5' 11"  (1.803 m)     Head Circumference      Peak Flow      Pain Score      Pain Loc      Pain Edu?      Excl. in [place]?      Constitutional: Alert and oriented. Well appearing and in no acute distress. Eyes: Conjunctivae are normal. PERRL. EOMI. Head: Atraumatic. Cardiovascular: Normal rate, regular rhythm. Normal S1 and S2.  Good peripheral circulation. Respiratory: Normal respiratory effort without tachypnea or retractions. Lungs CTAB. Good air entry to the bases with no decreased or absent breath  sounds. Musculoskeletal: Patient has 5 out of 5 strength of the lower extremities bilaterally. Right knee: Negative anterior and posterior drawer test. No laxity with MCL or LCL testing. Positive ballottement. Negative apprehension test. Palapble dorsalis pedis pulses bilaterally and symmetrically. Neurologic:  Normal speech and language. No gross focal neurologic deficits are appreciated.  Skin:  Skin is warm, dry and intact. No rash noted. Psychiatric: Mood and affect are normal. Speech and behavior are normal. Patient exhibits appropriate insight and judgement.   ____________________________________________   LABS (all labs ordered are listed, but only abnormal results are displayed)  Labs Reviewed - No data to display ____________________________________________  EKG   ____________________________________________  RADIOLOGY Unk Pinto, personally viewed and evaluated these images (plain radiographs) as part of my medical decision making, as well as reviewing the written report by the radiologist.    Dg Knee Complete 4 Views Right  Result Date: 04/22/2017 CLINICAL DATA:  Right knee pain EXAM: RIGHT KNEE - COMPLETE 4+ VIEW COMPARISON:  None. FINDINGS: No fracture or malalignment. Mild patellofemoral degenerative change with bony spurring. Small moderate suprapatellar joint effusion. Marked arthritis of the lateral compartment with subchondral sclerosis, narrowing and bony spurring. Mild to moderate degenerative changes of the medial compartment. Slight sclerosis proximal tibia may reflect a bone infarct. IMPRESSION: 1. No acute osseous abnormality 2. Moderate severe arthritis of the right knee. Small moderate suprapatellar joint effusion Electronically Signed   By: Donavan Foil M.D.   On: 04/22/2017 22:59    ____________________________________________    PROCEDURES  Procedure(s) performed:    Procedures    Medications - No data to  display   ____________________________________________   INITIAL IMPRESSION / ASSESSMENT AND PLAN / ED COURSE  Pertinent labs & imaging results that were available during my care of the patient were reviewed by me and considered in my medical decision making (see chart for details).  Review of the [place] CSRS was performed in accordance of the [place] prior to dispensing any controlled drugs.     Assessment and Plan: Right Knee Pain:  Patient presents to the emergency department with right knee pain for one day. DG right knee findings were consistent with osteoarthritis. Patient was discharged with Mobic for pain and inflammation. Patient education was provided regarding ice application. A referral was made to orthopedics, Dr. Mack Guise. All patient questions were answered.   ____________________________________________  FINAL CLINICAL IMPRESSION(S) / ED DIAGNOSES  Final diagnoses:  Arthritis of right knee      NEW MEDICATIONS STARTED DURING THIS VISIT:  Discharge Medication List as of 04/22/2017 11:00 PM    START taking these medications   Details  meloxicam (MOBIC) 7.5 MG tablet Take 1 tablet (7.5 mg total) by mouth daily., Starting Wed 04/22/2017, Until Wed 04/29/2017, Print  This chart was dictated using voice recognition software/Dragon. Despite best efforts to proofread, errors can occur which can change the meaning. Any change was purely unintentional.    Lannie Fields, PA-C 04/23/17 Jen Mow    Nance Pear, MD 04/23/17 1630

## 2017-04-22 NOTE — ED Triage Notes (Signed)
Pt to triage in wheelchair due to right knee pain. Pt reports having right knee pain x1 day, pain in the joint. Pt has HX of gout, pt denies taking medication for relief at home.

## 2017-05-13 ENCOUNTER — Other Ambulatory Visit: Payer: Self-pay | Admitting: Family Medicine

## 2017-05-13 DIAGNOSIS — E785 Hyperlipidemia, unspecified: Secondary | ICD-10-CM

## 2017-05-13 DIAGNOSIS — E119 Type 2 diabetes mellitus without complications: Secondary | ICD-10-CM

## 2017-05-13 DIAGNOSIS — Z794 Long term (current) use of insulin: Secondary | ICD-10-CM

## 2017-05-21 ENCOUNTER — Other Ambulatory Visit: Payer: Self-pay | Admitting: Family Medicine

## 2017-05-21 DIAGNOSIS — E1100 Type 2 diabetes mellitus with hyperosmolarity without nonketotic hyperglycemic-hyperosmolar coma (NKHHC): Secondary | ICD-10-CM

## 2017-05-21 NOTE — Telephone Encounter (Signed)
Medication has been refilled and sent to [facility] [address] [address] 

## 2017-05-27 ENCOUNTER — Ambulatory Visit (INDEPENDENT_AMBULATORY_CARE_PROVIDER_SITE_OTHER): Payer: BLUE CROSS/BLUE SHIELD | Admitting: Family Medicine

## 2017-05-27 ENCOUNTER — Encounter: Payer: Self-pay | Admitting: Family Medicine

## 2017-05-27 VITALS — BP 142/82 | HR 88 | Temp 98.5°F | Resp 14 | Wt 222.0 lb

## 2017-05-27 DIAGNOSIS — R030 Elevated blood-pressure reading, without diagnosis of hypertension: Secondary | ICD-10-CM | POA: Diagnosis not present

## 2017-05-27 DIAGNOSIS — E119 Type 2 diabetes mellitus without complications: Secondary | ICD-10-CM | POA: Diagnosis not present

## 2017-05-27 DIAGNOSIS — E7800 Pure hypercholesterolemia, unspecified: Secondary | ICD-10-CM | POA: Diagnosis not present

## 2017-05-27 DIAGNOSIS — Z794 Long term (current) use of insulin: Secondary | ICD-10-CM

## 2017-05-27 LAB — POCT GLYCOSYLATED HEMOGLOBIN (HGB A1C): HEMOGLOBIN A1C: 6.6

## 2017-05-27 MED ORDER — LISINOPRIL 2.5 MG PO TABS: 2.5000 mg | ORAL_TABLET | Freq: Every day | ORAL | 0 refills | Status: DC

## 2017-05-27 NOTE — Progress Notes (Signed)
Name: Donald [name]   MRN: [id number]    DOB: 04/27/1966   Date:05/27/2017       Progress Note  Subjective  Chief Complaint  Chief Complaint  Patient presents with  . Follow-up    2 months for DM    Diabetes  He presents for his follow-up diabetic visit. He has type 2 diabetes mellitus. His disease course has been improving. There are no hypoglycemic associated symptoms. Pertinent negatives for diabetes include no foot paresthesias, no polydipsia and no polyuria. There are no diabetic complications. Risk factors for coronary artery disease include dyslipidemia, diabetes mellitus and male sex. Current diabetic treatment includes intensive insulin program and oral agent (monotherapy). His weight is stable. He is following a diabetic diet. He monitors blood glucose at home 3-4 x per day. His breakfast blood glucose range is generally 110-130 mg/dl. An ACE inhibitor/angiotensin II receptor blocker is being taken. Eye exam is not current.  Hyperlipidemia  This is a chronic problem. The problem is controlled. Recent lipid tests were reviewed and are normal. Pertinent negatives include no leg pain, myalgias or shortness of breath. Current antihyperlipidemic treatment includes statins.      No past medical history on file.  Past Surgical History:  Procedure Laterality Date  . KNEE SURGERY Right     Family History  Problem Relation Age of Onset  . Hypertension Father     Social History   Social History  . Marital status: Married    Spouse name: N/A  . Number of children: N/A  . Years of education: N/A   Occupational History  . Not on file.   Social History Main Topics  . Smoking status: Current Every Day Smoker    Packs/day: 1.00    Types: Cigarettes  . Smokeless tobacco: Never Used  . Alcohol use Yes  . Drug use: No  . Sexual activity: Yes    Partners: Female   Other Topics Concern  . Not on file   Social History Narrative  . No narrative on file     Current  Outpatient Prescriptions:  .  aspirin EC 81 MG EC tablet, Take 1 tablet (81 mg total) by mouth daily., Disp: , Rfl:  .  Blood Glucose Monitoring Suppl (D-CARE GLUCOMETER) w/Device KIT, 1 Units by Does not apply route 4 (four) times daily -  before meals and at bedtime., Disp: 1 kit, Rfl: 0 .  Insulin Detemir (LEVEMIR FLEXPEN) 100 UNIT/ML Pen, Inject 15 Units into the skin daily at 10 pm., Disp: 15 mL, Rfl: 1 .  lisinopril (PRINIVIL,ZESTRIL) 2.5 MG tablet, TAKE 1 TABLET (2.5 MG TOTAL) BY MOUTH DAILY., Disp: 90 tablet, Rfl: 0 .  meloxicam (MOBIC) 7.5 MG tablet, Take 7.5 mg by mouth daily., Disp: , Rfl: 1 .  metFORMIN (GLUCOPHAGE) 500 MG tablet, TAKE 1 TABLET (500 MG TOTAL) BY MOUTH 2 (TWO) TIMES DAILY WITH A MEAL., Disp: 180 tablet, Rfl: 0 .  simvastatin (ZOCOR) 10 MG tablet, TAKE 1 TABLET (10 MG TOTAL) BY MOUTH DAILY., Disp: 90 tablet, Rfl: 0 .  Vitamin D, Ergocalciferol, (DRISDOL) 50000 units CAPS capsule, Take 1 capsule (50,000 Units total) by mouth once a week. For 12 weeks, Disp: 12 capsule, Rfl: 0  No Known Allergies   Review of Systems  Respiratory: Negative for shortness of breath.   Musculoskeletal: Negative for myalgias.  Endo/Heme/Allergies: Negative for polydipsia.     Objective  Vitals:   05/27/17 0831  BP: (!) 144/82  Pulse: 88  Resp: 14    Temp: 98.5 F (36.9 C)  TempSrc: Oral  SpO2: 95%  Weight: 222 lb (100.7 kg)    Physical Exam  Constitutional: He is oriented to person, place, and time and well-developed, well-nourished, and in no distress.  HENT:  Head: Normocephalic and atraumatic.  Cardiovascular: Normal rate, regular rhythm and normal heart sounds.   No murmur heard. Pulmonary/Chest: Effort normal and breath sounds normal. He has no wheezes.  Abdominal: Soft. Bowel sounds are normal. There is no tenderness.  Musculoskeletal: He exhibits no edema.  Neurological: He is alert and oriented to person, place, and time.  Psychiatric: Mood, memory, affect and  judgment normal.  Nursing note and vitals reviewed.     Assessment & Plan  1. Controlled type 2 diabetes mellitus without complication, with long-term current use of insulin (HCC) Point-of-care A1c 6.6%, well-controlled diabetes, continue on insulin and metformin. - POCT HgB A1C  2. Pure hypercholesterolemia FLP at goal, continue on statin  3. Elevated BP without diagnosis of hypertension BP elevated -on manual repeat as well. Continue on lisinopril which should also help with blood pressure, recheck in 3 months lisinopril (PRINIVIL,ZESTRIL) 2.5 MG tablet; Take 1 tablet (2.5 mg total) by mouth daily.  Dispense: 90 tablet; Refill: 0   Asad A.  [facility] 05/27/2017 8:50 AM 

## 2017-06-20 ENCOUNTER — Other Ambulatory Visit: Payer: Self-pay | Admitting: Family Medicine

## 2017-06-25 ENCOUNTER — Ambulatory Visit (INDEPENDENT_AMBULATORY_CARE_PROVIDER_SITE_OTHER): Payer: BLUE CROSS/BLUE SHIELD | Admitting: Family Medicine

## 2017-06-25 ENCOUNTER — Other Ambulatory Visit: Payer: Self-pay | Admitting: Family Medicine

## 2017-06-25 ENCOUNTER — Ambulatory Visit
Admission: RE | Admit: 2017-06-25 | Discharge: 2017-06-25 | Disposition: A | Discharge status: Home / Self Care | Payer: BLUE CROSS/BLUE SHIELD | Source: Ambulatory Visit | Attending: Family Medicine | Admitting: Family Medicine

## 2017-06-25 ENCOUNTER — Encounter: Payer: Self-pay | Admitting: Family Medicine

## 2017-06-25 VITALS — BP 140/82 | HR 100 | Temp 98.3°F | Resp 18 | Ht 71.0 in | Wt 222.7 lb

## 2017-06-25 DIAGNOSIS — M25472 Effusion, left ankle: Secondary | ICD-10-CM

## 2017-06-25 DIAGNOSIS — E559 Vitamin D deficiency, unspecified: Secondary | ICD-10-CM

## 2017-06-25 DIAGNOSIS — M7122 Synovial cyst of popliteal space [Baker], left knee: Secondary | ICD-10-CM | POA: Insufficient documentation

## 2017-06-25 DIAGNOSIS — R600 Localized edema: Secondary | ICD-10-CM | POA: Diagnosis not present

## 2017-06-25 DIAGNOSIS — M25572 Pain in left ankle and joints of left foot: Secondary | ICD-10-CM | POA: Diagnosis not present

## 2017-06-25 DIAGNOSIS — M7989 Other specified soft tissue disorders: Secondary | ICD-10-CM | POA: Diagnosis not present

## 2017-06-25 LAB — COMPLETE METABOLIC PANEL WITH GFR
ALBUMIN: 4.2 g/dL (ref 3.6–5.1)
ALK PHOS: 69 U/L (ref 40–115)
ALT: 15 U/L (ref 9–46)
AST: 13 U/L (ref 10–35)
BUN: 16 mg/dL (ref 7–25)
CALCIUM: 9.5 mg/dL (ref 8.6–10.3)
CHLORIDE: 106 mmol/L (ref 98–110)
CO2: 24 mmol/L (ref 20–31)
Creat: 0.92 mg/dL (ref 0.70–1.33)
GFR, Est African American: >89 mL/min (ref >=60)
GFR, Est Non African American: >89 mL/min (ref >=60)
GLUCOSE: 86 mg/dL (ref 65–99)
POTASSIUM: 4.3 mmol/L (ref 3.5–5.3)
SODIUM: 140 mmol/L (ref 135–146)
Total Bilirubin: 0.3 mg/dL (ref 0.2–1.2)
Total Protein: 7.3 g/dL (ref 6.1–8.1)

## 2017-06-25 LAB — CBC WITH DIFFERENTIAL/PLATELET
BASOS ABS: 0 {cells}/uL (ref 0–200)
Basophils Relative: 0 %
EOS PCT: 1 %
Eosinophils Absolute: 156 cells/uL (ref 15–500)
HCT: 40.7 % (ref 38.5–50.0)
Hemoglobin: 13.7 g/dL (ref 13.2–17.1)
LYMPHS PCT: 17 %
Lymphs Abs: 2652 cells/uL (ref 850–3900)
MCH: 32.2 pg (ref 27.0–33.0)
MCHC: 33.7 g/dL (ref 32.0–36.0)
MCV: 95.8 fL (ref 80.0–100.0)
MPV: 10.7 fL (ref 7.5–12.5)
Monocytes Absolute: 936 cells/uL (ref 200–950)
Monocytes Relative: 6 %
NEUTROS PCT: 76 %
Neutro Abs: 11856 cells/uL — ABNORMAL HIGH (ref 1500–7800)
Platelets: 329 10*3/uL (ref 140–400)
RBC: 4.25 MIL/uL (ref 4.20–5.80)
RDW: 14.1 % (ref 11.0–15.0)
WBC: 15.6 10*3/uL — AB (ref 3.8–10.8)

## 2017-06-25 MED ORDER — FUROSEMIDE 20 MG PO TABS: 20.0000 mg | ORAL_TABLET | Freq: Every day | ORAL | 0 refills | Status: DC

## 2017-06-25 NOTE — Progress Notes (Signed)
Name: Donald [name]   MRN: [id number]    DOB: 11-30-1966   Date:06/25/2017       Progress Note  Subjective  Chief Complaint  Chief Complaint  Patient presents with  . Foot Swelling    skin off, discolored, painful started this morning    HPI  Left Leg Swelling: Patient presents for left leg swelling, first noticed this AM with tingling in the left ankle, he was at work wearing steel toe boots and noticed his left ankle was discolored to a pink tan color, clear fluid around the ankle. He denies any dyspnea, fevers, has some pain in the affected area. He presented for medical evaluation.    No past medical history on file.  Past Surgical History:  Procedure Laterality Date  . KNEE SURGERY Right     Family History  Problem Relation Age of Onset  . Hypertension Father     Social History   Social History  . Marital status: Married    Spouse name: N/A  . Number of children: N/A  . Years of education: N/A   Occupational History  . Not on file.   Social History Main Topics  . Smoking status: Current Every Day Smoker    Packs/day: 1.00    Types: Cigarettes  . Smokeless tobacco: Never Used  . Alcohol use Yes  . Drug use: No  . Sexual activity: Yes    Partners: Female   Other Topics Concern  . Not on file   Social History Narrative  . No narrative on file     Current Outpatient Prescriptions:  .  aspirin EC 81 MG EC tablet, Take 1 tablet (81 mg total) by mouth daily., Disp: , Rfl:  .  Blood Glucose Monitoring Suppl (D-CARE GLUCOMETER) w/Device KIT, 1 Units by Does not apply route 4 (four) times daily -  before meals and at bedtime., Disp: 1 kit, Rfl: 0 .  Insulin Detemir (LEVEMIR FLEXPEN) 100 UNIT/ML Pen, Inject 15 Units into the skin daily at 10 pm., Disp: 15 mL, Rfl: 1 .  lisinopril (PRINIVIL,ZESTRIL) 2.5 MG tablet, Take 1 tablet (2.5 mg total) by mouth daily., Disp: 90 tablet, Rfl: 0 .  meloxicam (MOBIC) 7.5 MG tablet, Take 7.5 mg by mouth daily., Disp: , Rfl:  1 .  metFORMIN (GLUCOPHAGE) 500 MG tablet, TAKE 1 TABLET (500 MG TOTAL) BY MOUTH 2 (TWO) TIMES DAILY WITH A MEAL., Disp: 180 tablet, Rfl: 0 .  simvastatin (ZOCOR) 10 MG tablet, TAKE 1 TABLET (10 MG TOTAL) BY MOUTH DAILY., Disp: 90 tablet, Rfl: 0 .  Vitamin D, Ergocalciferol, (DRISDOL) 50000 units CAPS capsule, Take 1 capsule (50,000 Units total) by mouth once a week. For 12 weeks, Disp: 12 capsule, Rfl: 0 .  furosemide (LASIX) 20 MG tablet, Take 1 tablet (20 mg total) by mouth daily., Disp: 7 tablet, Rfl: 0  No Known Allergies   Review of Systems  Constitutional: Negative for chills, fever and malaise/fatigue.  Respiratory: Negative for cough, sputum production, shortness of breath and wheezing.   Cardiovascular: Positive for leg swelling. Negative for chest pain and palpitations.      Objective  Vitals:   06/25/17 1351  BP: 140/82  Pulse: 100  Resp: 18  Temp: 98.3 F (36.8 C)  TempSrc: Oral  SpO2: 98%  Weight: 222 lb 11.2 oz (101 kg)  Height: 5' 11"  (1.803 m)    Physical Exam  Constitutional: He is well-developed, well-nourished, and in no distress.  Cardiovascular: Normal rate, regular rhythm  and normal heart sounds.   No murmur heard. Pulmonary/Chest: Effort normal and breath sounds normal. He has no wheezes.  Musculoskeletal: He exhibits edema (left ankle skin discolored (hypopigmented), 1+ pitting edema, clear, odorless  fluid around the ankle).  Nursing note and vitals reviewed.     Recent Results (from the past 2160 hour(s))  POCT HgB A1C     Status: Abnormal   Collection Time: 05/27/17  8:41 AM  Result Value Ref Range   Hemoglobin A1C 6.6      Assessment & Plan  1. Left ankle swelling Obtain x-rays and ultrasound to rule out blood clots, osseous and intra-articular pathology, start on low-dose Lasix for 7 days. Advised to avoid wearing steel toe boots  - DG Ankle Complete Left; Future - VAS [place] LOWER EXTREMITY VENOUS (DVT); Future - CBC with  Differential - COMPLETE METABOLIC PANEL WITH GFR - furosemide (LASIX) 20 MG tablet; Take 1 tablet (20 mg total) by mouth daily.  Dispense: 7 tablet; Refill: 0  2. Vitamin D deficiency He has finished the 12 week course of vitamin D, recheck levels today - VITAMIN D 25 Hydroxy (Vit-D Deficiency, Fractures)   Corisa Montini Asad A. [facility] 06/25/2017 6:23 PM

## 2017-06-26 ENCOUNTER — Telehealth: Payer: Self-pay | Admitting: Family Medicine

## 2017-06-26 LAB — VITAMIN D 25 HYDROXY (VIT D DEFICIENCY, FRACTURES): VIT D 25 HYDROXY: 31 ng/mL (ref 30–100)

## 2017-06-26 NOTE — Telephone Encounter (Signed)
Pt has been notified that he has 1 refill left on Meloxicam he can call pharmacy, and he will have to have labs before we can refill vitamin D3 50,000 units

## 2017-06-26 NOTE — Telephone Encounter (Signed)
Pt was seen on yesterday and informed you that he was out of the vitamin d and meloxicam prescription. Please send to [facility]

## 2017-06-29 ENCOUNTER — Other Ambulatory Visit: Payer: Self-pay | Admitting: Family Medicine

## 2017-06-30 ENCOUNTER — Encounter: Payer: Self-pay | Admitting: Emergency Medicine

## 2017-06-30 ENCOUNTER — Encounter: Payer: Self-pay | Admitting: Family Medicine

## 2017-06-30 ENCOUNTER — Emergency Department: Payer: BLUE CROSS/BLUE SHIELD

## 2017-06-30 ENCOUNTER — Ambulatory Visit (INDEPENDENT_AMBULATORY_CARE_PROVIDER_SITE_OTHER): Payer: BLUE CROSS/BLUE SHIELD | Admitting: Family Medicine

## 2017-06-30 ENCOUNTER — Inpatient Hospital Stay
Admission: EM | Admit: 2017-06-30 | Discharge: 2017-07-02 | DRG: 603 | Disposition: A | Discharge status: Home / Self Care | Payer: BLUE CROSS/BLUE SHIELD | Attending: Internal Medicine | Admitting: Internal Medicine

## 2017-06-30 VITALS — BP 140/84 | HR 110 | Temp 98.6°F | Resp 17 | Ht 71.0 in | Wt 214.5 lb

## 2017-06-30 DIAGNOSIS — M7120 Synovial cyst of popliteal space [Baker], unspecified knee: Secondary | ICD-10-CM | POA: Diagnosis present

## 2017-06-30 DIAGNOSIS — R599 Enlarged lymph nodes, unspecified: Secondary | ICD-10-CM

## 2017-06-30 DIAGNOSIS — Z7982 Long term (current) use of aspirin: Secondary | ICD-10-CM

## 2017-06-30 DIAGNOSIS — Z7984 Long term (current) use of oral hypoglycemic drugs: Secondary | ICD-10-CM | POA: Diagnosis not present

## 2017-06-30 DIAGNOSIS — L97321 Non-pressure chronic ulcer of left ankle limited to breakdown of skin: Secondary | ICD-10-CM | POA: Diagnosis not present

## 2017-06-30 DIAGNOSIS — M7122 Synovial cyst of popliteal space [Baker], left knee: Secondary | ICD-10-CM

## 2017-06-30 DIAGNOSIS — L03116 Cellulitis of left lower limb: Secondary | ICD-10-CM

## 2017-06-30 DIAGNOSIS — F17210 Nicotine dependence, cigarettes, uncomplicated: Secondary | ICD-10-CM | POA: Diagnosis present

## 2017-06-30 DIAGNOSIS — L03115 Cellulitis of right lower limb: Secondary | ICD-10-CM | POA: Diagnosis not present

## 2017-06-30 DIAGNOSIS — L97319 Non-pressure chronic ulcer of right ankle with unspecified severity: Secondary | ICD-10-CM | POA: Diagnosis not present

## 2017-06-30 DIAGNOSIS — I10 Essential (primary) hypertension: Secondary | ICD-10-CM | POA: Diagnosis present

## 2017-06-30 DIAGNOSIS — L97311 Non-pressure chronic ulcer of right ankle limited to breakdown of skin: Secondary | ICD-10-CM | POA: Diagnosis not present

## 2017-06-30 DIAGNOSIS — M25472 Effusion, left ankle: Secondary | ICD-10-CM

## 2017-06-30 DIAGNOSIS — L259 Unspecified contact dermatitis, unspecified cause: Secondary | ICD-10-CM | POA: Diagnosis not present

## 2017-06-30 DIAGNOSIS — E785 Hyperlipidemia, unspecified: Secondary | ICD-10-CM | POA: Diagnosis present

## 2017-06-30 DIAGNOSIS — L97529 Non-pressure chronic ulcer of other part of left foot with unspecified severity: Secondary | ICD-10-CM | POA: Diagnosis not present

## 2017-06-30 DIAGNOSIS — E119 Type 2 diabetes mellitus without complications: Secondary | ICD-10-CM | POA: Diagnosis not present

## 2017-06-30 DIAGNOSIS — L97329 Non-pressure chronic ulcer of left ankle with unspecified severity: Secondary | ICD-10-CM | POA: Diagnosis present

## 2017-06-30 DIAGNOSIS — L089 Local infection of the skin and subcutaneous tissue, unspecified: Secondary | ICD-10-CM | POA: Diagnosis not present

## 2017-06-30 DIAGNOSIS — F17200 Nicotine dependence, unspecified, uncomplicated: Secondary | ICD-10-CM | POA: Diagnosis not present

## 2017-06-30 DIAGNOSIS — Z79899 Other long term (current) drug therapy: Secondary | ICD-10-CM

## 2017-06-30 DIAGNOSIS — Z720 Tobacco use: Secondary | ICD-10-CM | POA: Diagnosis not present

## 2017-06-30 DIAGNOSIS — T148XXA Other injury of unspecified body region, initial encounter: Secondary | ICD-10-CM | POA: Diagnosis not present

## 2017-06-30 DIAGNOSIS — M7989 Other specified soft tissue disorders: Secondary | ICD-10-CM | POA: Diagnosis not present

## 2017-06-30 HISTORY — DX: Type 2 diabetes mellitus without complications: E11.9

## 2017-06-30 LAB — CBC WITH DIFFERENTIAL/PLATELET
BASOS ABS: 0.1 10*3/uL (ref 0–0.1)
BASOS PCT: 1 %
Eosinophils Absolute: 0.2 10*3/uL (ref 0–0.7)
Eosinophils Relative: 2 %
HEMATOCRIT: 39.6 % — AB (ref 40.0–52.0)
Hemoglobin: 13.7 g/dL (ref 13.0–18.0)
Lymphocytes Relative: 25 %
Lymphs Abs: 3.1 10*3/uL (ref 1.0–3.6)
MCH: 32.3 pg (ref 26.0–34.0)
MCHC: 34.6 g/dL (ref 32.0–36.0)
MCV: 93.5 fL (ref 80.0–100.0)
MONO ABS: 0.9 10*3/uL (ref 0.2–1.0)
Monocytes Relative: 8 %
NEUTROS ABS: 8 10*3/uL — AB (ref 1.4–6.5)
NEUTROS PCT: 64 %
Platelets: 325 10*3/uL (ref 150–440)
RBC: 4.23 MIL/uL — ABNORMAL LOW (ref 4.40–5.90)
RDW: 13.6 % (ref 11.5–14.5)
WBC: 12.4 10*3/uL — AB (ref 3.8–10.6)

## 2017-06-30 LAB — GLUCOSE, CAPILLARY: GLUCOSE-CAPILLARY: 122 mg/dL — AB (ref 65–99)

## 2017-06-30 LAB — COMPREHENSIVE METABOLIC PANEL
ALK PHOS: 72 U/L (ref 38–126)
ALT: 14 U/L — AB (ref 17–63)
AST: 14 U/L — AB (ref 15–41)
Albumin: 4 g/dL (ref 3.5–5.0)
Anion gap: 9 (ref 5–15)
BUN: 17 mg/dL (ref 6–20)
CALCIUM: 9.7 mg/dL (ref 8.9–10.3)
CO2: 29 mmol/L (ref 22–32)
CREATININE: 0.95 mg/dL (ref 0.61–1.24)
Chloride: 104 mmol/L (ref 101–111)
Glucose, Bld: 102 mg/dL — ABNORMAL HIGH (ref 65–99)
Potassium: 4.1 mmol/L (ref 3.5–5.1)
Sodium: 142 mmol/L (ref 135–145)
TOTAL PROTEIN: 8.3 g/dL — AB (ref 6.5–8.1)
Total Bilirubin: 0.5 mg/dL (ref 0.3–1.2)

## 2017-06-30 MED ORDER — SIMVASTATIN 10 MG PO TABS
10.0000 mg | ORAL_TABLET | Freq: Every day | ORAL | Status: DC
  Administered 2017-07-01 – 2017-07-02 (×2): 10 mg via ORAL
  Filled 2017-06-30 (×3): qty 1

## 2017-06-30 MED ORDER — SODIUM CHLORIDE 0.9% FLUSH
3.0000 mL | Freq: Two times a day (BID) | INTRAVENOUS | Status: DC
  Administered 2017-06-30 – 2017-07-02 (×3): 3 mL via INTRAVENOUS

## 2017-06-30 MED ORDER — MELOXICAM 7.5 MG PO TABS
7.5000 mg | ORAL_TABLET | Freq: Every day | ORAL | Status: DC
  Administered 2017-06-30 – 2017-07-02 (×3): 7.5 mg via ORAL
  Filled 2017-06-30 (×3): qty 1

## 2017-06-30 MED ORDER — PIPERACILLIN-TAZOBACTAM 3.375 G IVPB 30 MIN
3.3750 g | Freq: Once | INTRAVENOUS | Status: AC
  Administered 2017-06-30: 3.375 g via INTRAVENOUS

## 2017-06-30 MED ORDER — VANCOMYCIN HCL IN DEXTROSE 1-5 GM/200ML-% IV SOLN
1000.0000 mg | Freq: Once | INTRAVENOUS | Status: AC
  Administered 2017-06-30: 1000 mg via INTRAVENOUS
  Filled 2017-06-30: qty 200

## 2017-06-30 MED ORDER — LISINOPRIL 2.5 MG PO TABS
2.5000 mg | ORAL_TABLET | Freq: Every day | ORAL | Status: DC
  Administered 2017-07-01 – 2017-07-02 (×2): 2.5 mg via ORAL
  Filled 2017-06-30 (×2): qty 1

## 2017-06-30 MED ORDER — HYDROCODONE-ACETAMINOPHEN 5-325 MG PO TABS: 1.0000 | ORAL_TABLET | ORAL | Status: DC | PRN

## 2017-06-30 MED ORDER — ACETAMINOPHEN 325 MG PO TABS: 650.0000 mg | ORAL_TABLET | Freq: Four times a day (QID) | ORAL | Status: DC | PRN

## 2017-06-30 MED ORDER — PIPERACILLIN-TAZOBACTAM 3.375 G IVPB
3.3750 g | Freq: Three times a day (TID) | INTRAVENOUS | Status: DC
  Administered 2017-06-30 – 2017-07-02 (×5): 3.375 g via INTRAVENOUS
  Filled 2017-06-30 (×6): qty 50

## 2017-06-30 MED ORDER — ENOXAPARIN SODIUM 40 MG/0.4ML ~~LOC~~ SOLN
40.0000 mg | SUBCUTANEOUS | Status: DC
  Administered 2017-06-30 – 2017-07-01 (×2): 40 mg via SUBCUTANEOUS
  Filled 2017-06-30 (×2): qty 0.4

## 2017-06-30 MED ORDER — VANCOMYCIN HCL IN DEXTROSE 1-5 GM/200ML-% IV SOLN
1000.0000 mg | Freq: Three times a day (TID) | INTRAVENOUS | Status: DC
  Administered 2017-07-01 (×2): 1000 mg via INTRAVENOUS
  Filled 2017-06-30 (×8): qty 200

## 2017-06-30 MED ORDER — SODIUM CHLORIDE 0.9 % IV SOLN: 250.0000 mL | INTRAVENOUS | Status: DC | PRN

## 2017-06-30 MED ORDER — FENTANYL CITRATE (PF) 100 MCG/2ML IJ SOLN
50.0000 ug | INTRAMUSCULAR | Status: DC | PRN
  Administered 2017-06-30: 50 ug via INTRAVENOUS
  Filled 2017-06-30: qty 2

## 2017-06-30 MED ORDER — FUROSEMIDE 20 MG PO TABS
20.0000 mg | ORAL_TABLET | Freq: Every day | ORAL | Status: DC
  Administered 2017-07-01: 20 mg via ORAL
  Filled 2017-06-30: qty 1

## 2017-06-30 MED ORDER — ONDANSETRON HCL 4 MG/2ML IJ SOLN: 4.0000 mg | Freq: Four times a day (QID) | INTRAMUSCULAR | Status: DC | PRN

## 2017-06-30 MED ORDER — ACETAMINOPHEN 650 MG RE SUPP: 650.0000 mg | Freq: Four times a day (QID) | RECTAL | Status: DC | PRN

## 2017-06-30 MED ORDER — INSULIN ASPART 100 UNIT/ML ~~LOC~~ SOLN
0.0000 [IU] | Freq: Three times a day (TID) | SUBCUTANEOUS | Status: DC
Start: 2017-07-01 — End: 2017-07-02
  Administered 2017-07-02: 1 [IU] via SUBCUTANEOUS
  Filled 2017-06-30 (×2): qty 0.09
  Filled 2017-06-30: qty 1
  Filled 2017-06-30: qty 0.09

## 2017-06-30 MED ORDER — METFORMIN HCL 500 MG PO TABS
500.0000 mg | ORAL_TABLET | Freq: Two times a day (BID) | ORAL | Status: DC
  Administered 2017-07-02: 500 mg via ORAL
  Filled 2017-06-30 (×4): qty 1

## 2017-06-30 MED ORDER — ONDANSETRON HCL 4 MG PO TABS
4.0000 mg | ORAL_TABLET | Freq: Four times a day (QID) | ORAL | Status: DC | PRN
Start: 2017-06-30 — End: 2017-07-02

## 2017-06-30 MED ORDER — ASPIRIN EC 81 MG PO TBEC
81.0000 mg | DELAYED_RELEASE_TABLET | Freq: Every day | ORAL | Status: DC
  Administered 2017-07-01 – 2017-07-02 (×2): 81 mg via ORAL
  Filled 2017-06-30 (×2): qty 1

## 2017-06-30 MED ORDER — NICOTINE 21 MG/24HR TD PT24
21.0000 mg | MEDICATED_PATCH | Freq: Every day | TRANSDERMAL | Status: DC
  Administered 2017-06-30 – 2017-07-02 (×3): 21 mg via TRANSDERMAL
  Filled 2017-06-30 (×3): qty 1

## 2017-06-30 MED ORDER — SODIUM CHLORIDE 0.9% FLUSH: 3.0000 mL | INTRAVENOUS | Status: DC | PRN

## 2017-06-30 MED ORDER — INSULIN DETEMIR 100 UNIT/ML ~~LOC~~ SOLN
15.0000 [IU] | Freq: Every day | SUBCUTANEOUS | Status: DC
  Administered 2017-06-30 – 2017-07-01 (×2): 15 [IU] via SUBCUTANEOUS
  Filled 2017-06-30 (×3): qty 0.15

## 2017-06-30 NOTE — Progress Notes (Addendum)
Pharmacy Antibiotic Note  Donald RichesBennie D [name] is a 51 y.o. male admitted on 06/30/2017 with cellulitis with purulent drainage vs wound infection vs abscess.  Pharmacy has been consulted for vancomycin and Zosyn dosing.  Plan: 1. Vancomycin 1 gm IV x 1 in ED followed in approximately 6 hours by vancomycin 1 gm IV Q8H, predicted trough 17 mcg/mL. Pharmacy will continue to follow and adjust as needed to maintain trough 15 to 20 mcg/ml (higher trough goal due to purulent drainage, question of abscess, and possible DM wound).   Vd 58.8 L, Ke 0.093 hr-1, T1/2 7.4 hr  2. Zosyn 3.375 gm IV Q8H EI  Weight: 214 lb (97.1 kg)  Temp (24hrs), Avg:99 F (37.2 C), Min:98.6 F (37 C), Max:99.4 F (37.4 C)   Recent Labs Lab 06/25/17 1422 06/30/17 1803  WBC 15.6* 12.4*  CREATININE 0.92 0.95    Estimated Creatinine Clearance: 106.8 mL/min (by C-G formula based on SCr of 0.95 mg/dL).    No Known Allergies  Thank you for allowing pharmacy to be a part of this patient's care.  Carola FrostNathan A Aadarsh Cozort, Pharm.D., BCPS Clinical Pharmacist 06/30/2017 7:03 PM

## 2017-06-30 NOTE — H&P (Addendum)
[facility] NAME: Donald [name]    MR#:  [id number]  DATE OF BIRTH:  [birth year]  DATE OF ADMISSION:  06/30/2017  PRIMARY CARE PHYSICIAN: Roselee Nova, MD   REQUESTING/REFERRING PHYSICIAN: Merlyn Lot MD  CHIEF COMPLAINT:   Chief Complaint  Patient presents with  . Wound Infection    HISTORY OF PRESENT ILLNESS: Donald [name]  is a 51 y.o. male with a known history of Diabetes type 2 and essential hypertension and nicotine addiction presenting to the hospital with complaint of left leg swelling. Patient was seen by his primary care provider last week with the area on his left ankle with skin breakdown and swelling. Patient underwent a Doppler which was negative for DVT. When he went for a follow-up today the swelling was worse. And he had drainage from the wound therefore he is referred to the emergency room for further evaluation. Patient denies any fevers chills denies any numbness or tingling in his foot  PAST MEDICAL HISTORY:   Past Medical History:  Diagnosis Date  . Diabetes mellitus without complication ([place])   . Hypertension     PAST SURGICAL HISTORY:  Past Surgical History:  Procedure Laterality Date  . KNEE SURGERY Right     SOCIAL HISTORY:  Social History  Substance Use Topics  . Smoking status: Current Every Day Smoker    Packs/day: 1.00    Types: Cigarettes  . Smokeless tobacco: Never Used  . Alcohol use Yes    FAMILY HISTORY:  Family History  Problem Relation Age of Onset  . Hypertension Father     DRUG ALLERGIES: No Known Allergies  REVIEW OF SYSTEMS:   CONSTITUTIONAL: No fever, fatigue or weakness.  EYES: No blurred or double vision.  EARS, NOSE, AND THROAT: No tinnitus or ear pain.  RESPIRATORY: No cough, shortness of breath, wheezing or hemoptysis.  CARDIOVASCULAR: No chest pain, orthopnea, edema.  GASTROINTESTINAL: No nausea, vomiting, diarrhea or abdominal pain.  GENITOURINARY: No  dysuria, hematuria.  ENDOCRINE: No polyuria, nocturia,  HEMATOLOGY: No anemia, easy bruising or bleeding SKIN: Skin breakdown in the left leg near his ankle, as well as drainage and erythema and warmth. MUSCULOSKELETAL: No joint pain or arthritis.   NEUROLOGIC: No tingling, numbness, weakness.  PSYCHIATRY: No anxiety or depression.   MEDICATIONS AT HOME:  Prior to Admission medications   Medication Sig Start Date End Date Taking? Authorizing Provider  aspirin EC 81 MG EC tablet Take 1 tablet (81 mg total) by mouth daily. 07/23/16  Yes Sudini, Alveta Heimlich, MD  furosemide (LASIX) 20 MG tablet Take 1 tablet (20 mg total) by mouth daily. 06/25/17 07/02/17 Yes Roselee Nova, MD  Insulin Detemir (LEVEMIR FLEXPEN) 100 UNIT/ML Pen Inject 15 Units into the skin daily at 10 pm. 10/31/16  Yes Roselee Nova, MD  lisinopril (PRINIVIL,ZESTRIL) 2.5 MG tablet Take 1 tablet (2.5 mg total) by mouth daily. 05/27/17  Yes Roselee Nova, MD  meloxicam (MOBIC) 7.5 MG tablet Take 7.5 mg by mouth daily. 05/18/17  Yes [provider]  metFORMIN (GLUCOPHAGE) 500 MG tablet TAKE 1 TABLET (500 MG TOTAL) BY MOUTH 2 (TWO) TIMES DAILY WITH A MEAL. 05/21/17  Yes Keith [facility] Asad A, MD  simvastatin (ZOCOR) 10 MG tablet TAKE 1 TABLET (10 MG TOTAL) BY MOUTH DAILY. 05/13/17  Yes Roselee Nova, MD  Vitamin D, Ergocalciferol, (DRISDOL) 50000 units CAPS capsule Take 1 capsule (50,000 Units total) by mouth  once a week. For 12 weeks Patient taking differently: Take 50,000 Units by mouth once a week. Take tab every Monday For 12 weeks 03/31/17  Yes Roselee Nova, MD  Blood Glucose Monitoring Suppl (D-CARE GLUCOMETER) w/Device KIT 1 Units by Does not apply route 4 (four) times daily -  before meals and at bedtime. 07/23/16   Hillary Bow, MD      PHYSICAL EXAMINATION:   VITAL SIGNS: Blood pressure 122/81, pulse (!) 105, temperature 99.4 F (37.4 C), temperature source Oral, resp. rate 18, weight 214 lb (97.1 kg), SpO2 99  %.  GENERAL:  51 y.o.-year-old patient lying in the bed with no acute distress.  EYES: Pupils equal, round, reactive to light and accommodation. No scleral icterus. Extraocular muscles intact.  HEENT: Head atraumatic, normocephalic. Oropharynx and nasopharynx clear.  NECK:  Supple, no jugular venous distention. No thyroid enlargement, no tenderness.  LUNGS: Normal breath sounds bilaterally, no wheezing, rales,rhonchi or crepitation. No use of accessory muscles of respiration.  CARDIOVASCULAR: S1, S2 normal. No murmurs, rubs, or gallops.  ABDOMEN: Soft, nontender, nondistended. Bowel sounds present. No organomegaly or mass.  EXTREMITIES:Left leg there is swelling of the foot near the ankle area there he has skin breakdown and there is drainage NEUROLOGIC: Cranial nerves II through XII are intact. Muscle strength 5/5 in all extremities. Sensation intact. Gait not checked.  PSYCHIATRIC: The patient is alert and oriented x 3.  SKIN: No obvious rash, lesion, or ulcer.   LABORATORY PANEL:   CBC  Recent Labs Lab 06/25/17 1422 06/30/17 1803  WBC 15.6* 12.4*  HGB 13.7 13.7  HCT 40.7 39.6*  PLT 329 325  MCV 95.8 93.5  MCH 32.2 32.3  MCHC 33.7 34.6  RDW 14.1 13.6  LYMPHSABS 2,652 3.1  MONOABS 936 0.9  EOSABS 156 0.2  BASOSABS 0 0.1   ------------------------------------------------------------------------------------------------------------------  Chemistries   Recent Labs Lab 06/25/17 1422 06/30/17 1803  NA 140 142  K 4.3 4.1  CL 106 104  CO2 24 29  GLUCOSE 86 102*  BUN 16 17  CREATININE 0.92 0.95  CALCIUM 9.5 9.7  AST 13 14*  ALT 15 14*  ALKPHOS 69 72  BILITOT 0.3 0.5   ------------------------------------------------------------------------------------------------------------------ estimated creatinine clearance is 106.8 mL/min (by C-G formula based on SCr of 0.95  mg/dL). ------------------------------------------------------------------------------------------------------------------ No results for input(s): TSH, T4TOTAL, T3FREE, THYROIDAB in the last 72 hours.  Invalid input(s): FREET3   Coagulation profile No results for input(s): INR, PROTIME in the last 168 hours. ------------------------------------------------------------------------------------------------------------------- No results for input(s): DDIMER in the last 72 hours. -------------------------------------------------------------------------------------------------------------------  Cardiac Enzymes No results for input(s): CKMB, TROPONINI, MYOGLOBIN in the last 168 hours.  Invalid input(s): CK ------------------------------------------------------------------------------------------------------------------ Invalid input(s): POCBNP  ---------------------------------------------------------------------------------------------------------------  Urinalysis    Component Value Date/Time   COLORURINE COLORLESS (A) 07/22/2016 0847   APPEARANCEUR CLEAR (A) 07/22/2016 0847   LABSPEC 1.026 07/22/2016 0847   PHURINE 6.0 07/22/2016 0847   GLUCOSEU >500 (A) 07/22/2016 0847   HGBUR NEGATIVE 07/22/2016 [place] 07/22/2016 0847   KETONESUR NEGATIVE 07/22/2016 0847   PROTEINUR NEGATIVE 07/22/2016 0847   NITRITE NEGATIVE 07/22/2016 0847   LEUKOCYTESUR NEGATIVE 07/22/2016 0847     RADIOLOGY: Dg Ankle Complete Left  Result Date: 06/30/2017 CLINICAL DATA:  Left ankle is wrong with red skin we pain. Scabbed skin is noted. EXAM: LEFT ANKLE COMPLETE - 3+ VIEW COMPARISON:  None. FINDINGS: No acute fracture. No dislocation. Diffuse soft tissue swelling about the ankle is present. No destructive  bone lesion. No obvious abnormal periosteum reaction. Mild degenerative changes in the ankle joint and midfoot. IMPRESSION: No acute bony pathology. Soft tissue swelling about the  ankle is noted. Electronically Signed   By: Marybelle Killings M.D.   On: 06/30/2017 19:18    EKG: Orders placed or performed during the hospital encounter of 07/22/16  . EKG 12-Lead  . EKG 12-Lead  . EKG    IMPRESSION AND PLAN: Patient is a 51 year old with diabetes presenting with left foot infection  1. Left foot infection Place patient on IV vancomycin and Zosyn Podiatry consult Vascular evaluation for possible arterial disease  2. Diabetes type 2 Continue therapy with Levemir and place him on sliding scale insulin continue Glucophage  3. Essential hypertension continue therapy with lisinopril  4. Hyperlipidemia continue simvastatin  5. Miscellaneous Lovenox for DVT prophylaxis  6. Nicotine abuse smoking cessation provided for minutes spent strongly recommend he stop smoke Patient will be started on a nicotine patch All the records are reviewed and case discussed with ED provider. Management plans discussed with the patient, family and they are in agreement.  CODE STATUS: Code Status History    Date Active Date Inactive Code Status Order ID Comments User Context   07/22/2016 10:16 AM 07/23/2016  8:25 PM Full Code [id number]  Hillary Bow, MD ED       TOTAL TIME TAKING CARE OF THIS PATIENT: 55 minutes.    Dustin Flock M.D on 06/30/2017 at 8:06 PM  Between 7am to 6pm - Pager - [phone]  After 6pm go to [email or web address] - password [email or web address] [facility]  [facility]  Office  [phone]  CC: Primary care physician; Roselee Nova, MD

## 2017-06-30 NOTE — ED Notes (Signed)
Pt sent here from [facility] for IV antibiotics due to abnormal labs.

## 2017-06-30 NOTE — ED Triage Notes (Signed)
Pt sent over for further eval for possible infection of left ankle.

## 2017-06-30 NOTE — Progress Notes (Signed)
Name: Donald [name]   MRN: [id number]    DOB: 1966-06-01   Date:06/30/2017       Progress Note  Subjective  Chief Complaint  Chief Complaint  Patient presents with  . Foot Pain    foot not getting any better    HPI  Pt. Presents for evaluation of left lower leg and ankle, now getting worse with fluid drainage and skin peeling off. Last week, he was seen when he had swelling at the left lower leg, was started on Lasix 20 mg, ultrasound was ordered to rule out DVT. He presents today for evaluation of the worsening swelling with wound formation and to go over the results of ultrasound.     History reviewed. No pertinent past medical history.  Past Surgical History:  Procedure Laterality Date  . KNEE SURGERY Right     Family History  Problem Relation Age of Onset  . Hypertension Father     Social History   Social History  . Marital status: Married    Spouse name: N/A  . Number of children: N/A  . Years of education: N/A   Occupational History  . Not on file.   Social History Main Topics  . Smoking status: Current Every Day Smoker    Packs/day: 1.00    Types: Cigarettes  . Smokeless tobacco: Never Used  . Alcohol use Yes  . Drug use: No  . Sexual activity: Yes    Partners: Female   Other Topics Concern  . Not on file   Social History Narrative  . No narrative on file     Current Outpatient Prescriptions:  .  aspirin EC 81 MG EC tablet, Take 1 tablet (81 mg total) by mouth daily., Disp: , Rfl:  .  Blood Glucose Monitoring Suppl (D-CARE GLUCOMETER) w/Device KIT, 1 Units by Does not apply route 4 (four) times daily -  before meals and at bedtime., Disp: 1 kit, Rfl: 0 .  furosemide (LASIX) 20 MG tablet, Take 1 tablet (20 mg total) by mouth daily., Disp: 7 tablet, Rfl: 0 .  Insulin Detemir (LEVEMIR FLEXPEN) 100 UNIT/ML Pen, Inject 15 Units into the skin daily at 10 pm., Disp: 15 mL, Rfl: 1 .  lisinopril (PRINIVIL,ZESTRIL) 2.5 MG tablet, Take 1 tablet (2.5 mg  total) by mouth daily., Disp: 90 tablet, Rfl: 0 .  meloxicam (MOBIC) 7.5 MG tablet, Take 7.5 mg by mouth daily., Disp: , Rfl: 1 .  metFORMIN (GLUCOPHAGE) 500 MG tablet, TAKE 1 TABLET (500 MG TOTAL) BY MOUTH 2 (TWO) TIMES DAILY WITH A MEAL., Disp: 180 tablet, Rfl: 0 .  simvastatin (ZOCOR) 10 MG tablet, TAKE 1 TABLET (10 MG TOTAL) BY MOUTH DAILY., Disp: 90 tablet, Rfl: 0 .  Vitamin D, Ergocalciferol, (DRISDOL) 50000 units CAPS capsule, Take 1 capsule (50,000 Units total) by mouth once a week. For 12 weeks, Disp: 12 capsule, Rfl: 0  No Known Allergies   ROS  Please see history of present illness for complete discussion of ROS  Objective  Vitals:   06/30/17 1542  BP: 140/84  Pulse: (!) 110  Resp: 17  Temp: 98.6 F (37 C)  TempSrc: Oral  SpO2: 98%  Weight: 214 lb 8 oz (97.3 kg)  Height: _0  (1.803 m)    Physical Exam  Constitutional: He is well-developed, well-nourished, and in no distress.  Musculoskeletal:       Left ankle: He exhibits swelling.       Feet:  Generalized edema with skin breakdown  along the left ankle, purulent pus drainage from different areas on the wound, tenderness to palpation  Nursing note and vitals reviewed.     Assessment & Plan  1. Infected open wound Suspect cellulitis, given he has diabetes, we will refer him to ER for IV antibiotics, patient verbalized agreement with plan and was discussed with charge nurse at [facility] ER - CBC with Differential/Platelet  2. 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 Enlarged lymph node Likely because of infection, other differentials include malignancy. Will return in one week to consider getting CT scan abdomen and pelvis for complete evaluation - CBC with Differential/Platelet   Saramarie Stinger Asad A. [facility] 06/30/2017 4:30 PM

## 2017-06-30 NOTE — ED Provider Notes (Signed)
[facility] Emergency Department Provider Note    First MD Initiated Contact with Patient 06/30/17 1839     (approximate)  I have reviewed the triage vital signs and the nursing notes.   HISTORY  Chief Complaint Wound Infection    HPI Donald [name] is a 51 y.o. male history hypertension and diabetes presents at the direction of his primary care physician due to concern for worsening of a left lower extremity cellulitis and wound infection. Patient now with purulent drainage and worsening edema to his left foot. Denies any trauma. No measured fevers. States that he was started on Lasix several days ago and has been taking that without any improvement in the Lasix. States that he's been controlling his sugars well. Denies any nausea or vomiting. Had an ultrasound several days ago that showed no evidence of DVT.   Past Medical History:  Diagnosis Date  . Diabetes mellitus without complication ([place])    Family History  Problem Relation Age of Onset  . Hypertension Father    Past Surgical History:  Procedure Laterality Date  . KNEE SURGERY Right    Patient Active Problem List   Diagnosis Date Noted  . Elevated BP without diagnosis of hypertension 05/27/2017  . Screening for prostate cancer 03/27/2017  . Need for hepatitis C screening test 03/27/2017  . Hyperlipidemia 10/31/2016  . Diabetes mellitus type 2 with hyperosmolarity, uncontrolled ([place]) 07/30/2016  . Left groin pain 04/10/2016      Prior to Admission medications   Medication Sig Start Date End Date Taking? Authorizing Provider  aspirin EC 81 MG EC tablet Take 1 tablet (81 mg total) by mouth daily. 07/23/16   Hillary Bow, MD  Blood Glucose Monitoring Suppl (D-CARE GLUCOMETER) w/Device KIT 1 Units by Does not apply route 4 (four) times daily -  before meals and at bedtime. 07/23/16   Hillary Bow, MD  furosemide (LASIX) 20 MG tablet Take 1 tablet (20 mg total) by mouth daily. 06/25/17  07/02/17  Roselee Nova, MD  Insulin Detemir (LEVEMIR FLEXPEN) 100 UNIT/ML Pen Inject 15 Units into the skin daily at 10 pm. 10/31/16   Roselee Nova, MD  lisinopril (PRINIVIL,ZESTRIL) 2.5 MG tablet Take 1 tablet (2.5 mg total) by mouth daily. 05/27/17   Roselee Nova, MD  meloxicam (MOBIC) 7.5 MG tablet Take 7.5 mg by mouth daily. 05/18/17   [provider]  metFORMIN (GLUCOPHAGE) 500 MG tablet TAKE 1 TABLET (500 MG TOTAL) BY MOUTH 2 (TWO) TIMES DAILY WITH A MEAL. 05/21/17   Rochel Brome A, MD  simvastatin (ZOCOR) 10 MG tablet TAKE 1 TABLET (10 MG TOTAL) BY MOUTH DAILY. 05/13/17   Roselee Nova, MD  Vitamin D, Ergocalciferol, (DRISDOL) 50000 units CAPS capsule Take 1 capsule (50,000 Units total) by mouth once a week. For 12 weeks 03/31/17   Roselee Nova, MD    Allergies Patient has no known allergies.    Social History Social History  Substance Use Topics  . Smoking status: Current Every Day Smoker    Packs/day: 1.00    Types: Cigarettes  . Smokeless tobacco: Never Used  . Alcohol use Yes    Review of Systems Patient denies headaches, rhinorrhea, blurry vision, numbness, shortness of breath, chest pain, edema, cough, abdominal pain, nausea, vomiting, diarrhea, dysuria, fevers, rashes or hallucinations unless otherwise stated above in HPI. ____________________________________________   PHYSICAL EXAM:  VITAL SIGNS: Vitals:   06/30/17 1751  BP:  122/81  Pulse: (!) 105  Resp: 18  Temp: 99.4 F (37.4 C)    Constitutional: Alert and oriented. Well appearing and in no acute distress. Eyes: Conjunctivae are normal.  Head: Atraumatic. Nose: No congestion/rhinnorhea. Mouth/Throat: Mucous membranes are moist.   Neck: No stridor. Painless ROM.  Cardiovascular: Normal rate, regular rhythm. Grossly normal heart sounds.  Good peripheral circulation. Respiratory: Normal respiratory effort.  No retractions. Lungs CTAB. Gastrointestinal: Soft and nontender. No  distention. No abdominal bruits. No CVA tenderness. Musculoskeletal: Purulent and erythematous skin changes to the anterior aspect of the left ankle. There is also 2+ pitting edema of the ankle and foot. Brisk cap refill distally. No joint effusions. Neurologic:  Normal speech and language. No gross focal neurologic deficits are appreciated. No facial droop Skin:  Skin is warm, dry and intact. Cellulitis as above Psychiatric: Mood and affect are normal. Speech and behavior are normal.  ____________________________________________   LABS (all labs ordered are listed, but only abnormal results are displayed)  Results for orders placed or performed during the hospital encounter of 06/30/17 (from the past 24 hour(s))  Comprehensive metabolic panel     Status: Abnormal   Collection Time: 06/30/17  6:03 PM  Result Value Ref Range   Sodium 142 135 - 145 mmol/L   Potassium 4.1 3.5 - 5.1 mmol/L   Chloride 104 101 - 111 mmol/L   CO2 29 22 - 32 mmol/L   Glucose, Bld 102 (H) 65 - 99 mg/dL   BUN 17 6 - 20 mg/dL   Creatinine, Ser 0.95 0.61 - 1.24 mg/dL   Calcium 9.7 8.9 - 10.3 mg/dL   Total Protein 8.3 (H) 6.5 - 8.1 g/dL   Albumin 4.0 3.5 - 5.0 g/dL   AST 14 (L) 15 - 41 U/L   ALT 14 (L) 17 - 63 U/L   Alkaline Phosphatase 72 38 - 126 U/L   Total Bilirubin 0.5 0.3 - 1.2 mg/dL   GFR calc non Af Amer >60 >60 mL/min   GFR calc Af Amer >60 >60 mL/min   Anion gap 9 5 - 15  CBC with Differential     Status: Abnormal   Collection Time: 06/30/17  6:03 PM  Result Value Ref Range   WBC 12.4 (H) 3.8 - 10.6 K/uL   RBC 4.23 (L) 4.40 - 5.90 MIL/uL   Hemoglobin 13.7 13.0 - 18.0 g/dL   HCT 39.6 (L) 40.0 - 52.0 %   MCV 93.5 80.0 - 100.0 fL   MCH 32.3 26.0 - 34.0 pg   MCHC 34.6 32.0 - 36.0 g/dL   RDW 13.6 11.5 - 14.5 %   Platelets 325 150 - 440 K/uL   Neutrophils Relative % 64 %   Neutro Abs 8.0 (H) 1.4 - 6.5 K/uL   Lymphocytes Relative 25 %   Lymphs Abs 3.1 1.0 - 3.6 K/uL   Monocytes Relative 8 %    Monocytes Absolute 0.9 0.2 - 1.0 K/uL   Eosinophils Relative 2 %   Eosinophils Absolute 0.2 0 - 0.7 K/uL   Basophils Relative 1 %   Basophils Absolute 0.1 0 - 0.1 K/uL   ____________________________________________ ____________________________________________  RADIOLOGY  I personally reviewed all radiographic images ordered to evaluate for the above acute complaints and reviewed radiology reports and findings.  These findings were personally discussed with the patient.  Please see medical record for radiology report.  ____________________________________________   PROCEDURES  Procedure(s) performed:  Procedures    Critical Care performed: no ____________________________________________  INITIAL IMPRESSION / ASSESSMENT AND PLAN / ED COURSE  Pertinent labs & imaging results that were available during my care of the patient were reviewed by me and considered in my medical decision making (see chart for details).  DDX: cellulitis, abscess, nsti, dermatitis, venous stasis  Donald [name] is a 51 y.o. who presents to the ED with purulent cellulitis of the left ankle as described above. He does have a low-grade temperature and elevated white count with a left shift. No evidence of DVT on recent ultrasound. No trauma. Renal function otherwise at baseline. Glucose is controlled at 102. Based on the severity of his cellulitis and history of diabetes or do feel the patient will require admission for IV antibiotics.  Have discussed with the patient and available family all diagnostics and treatments performed thus far and all questions were answered to the best of my ability. The patient demonstrates understanding and agreement with plan.       ____________________________________________   FINAL CLINICAL IMPRESSION(S) / ED DIAGNOSES  Final diagnoses:  Cellulitis of left lower extremity      NEW MEDICATIONS STARTED DURING THIS VISIT:  New Prescriptions   No medications on  file     Note:  This document was prepared using Dragon voice recognition software and may include unintentional dictation errors.    Merlyn Lot, MD 06/30/17 [phone]

## 2017-06-30 NOTE — ED Notes (Signed)
Left ankle with raw, red skin with weeping and scabbed skin noted. States he was seen at PCP and sent for further eval and IV antibiotics. Good pulses and cap refill noted.

## 2017-07-01 DIAGNOSIS — E119 Type 2 diabetes mellitus without complications: Secondary | ICD-10-CM

## 2017-07-01 DIAGNOSIS — F17200 Nicotine dependence, unspecified, uncomplicated: Secondary | ICD-10-CM

## 2017-07-01 DIAGNOSIS — L97529 Non-pressure chronic ulcer of other part of left foot with unspecified severity: Secondary | ICD-10-CM

## 2017-07-01 LAB — CBC
HCT: 36.4 % — ABNORMAL LOW (ref 40.0–52.0)
Hemoglobin: 12.6 g/dL — ABNORMAL LOW (ref 13.0–18.0)
MCH: 32.3 pg (ref 26.0–34.0)
MCHC: 34.7 g/dL (ref 32.0–36.0)
MCV: 93.1 fL (ref 80.0–100.0)
PLATELETS: 287 10*3/uL (ref 150–440)
RBC: 3.91 MIL/uL — AB (ref 4.40–5.90)
RDW: 13.8 % (ref 11.5–14.5)
WBC: 8.4 10*3/uL (ref 3.8–10.6)

## 2017-07-01 LAB — BASIC METABOLIC PANEL
ANION GAP: 7 (ref 5–15)
BUN: 18 mg/dL (ref 6–20)
CALCIUM: 9.1 mg/dL (ref 8.9–10.3)
CO2: 28 mmol/L (ref 22–32)
Chloride: 104 mmol/L (ref 101–111)
Creatinine, Ser: 0.91 mg/dL (ref 0.61–1.24)
Glucose, Bld: 122 mg/dL — ABNORMAL HIGH (ref 65–99)
POTASSIUM: 3.4 mmol/L — AB (ref 3.5–5.1)
SODIUM: 139 mmol/L (ref 135–145)

## 2017-07-01 LAB — GLUCOSE, CAPILLARY
GLUCOSE-CAPILLARY: 86 mg/dL (ref 65–99)
GLUCOSE-CAPILLARY: 74 mg/dL (ref 65–99)
GLUCOSE-CAPILLARY: 119 mg/dL — AB (ref 65–99)
Glucose-Capillary: 94 mg/dL (ref 65–99)

## 2017-07-01 MED ORDER — POTASSIUM CHLORIDE CRYS ER 20 MEQ PO TBCR
40.0000 meq | EXTENDED_RELEASE_TABLET | Freq: Once | ORAL | Status: AC
  Administered 2017-07-01: 40 meq via ORAL
  Filled 2017-07-01: qty 2

## 2017-07-01 NOTE — Progress Notes (Signed)
Pharmacy Antibiotic Note  Imagene RichesBennie D Reinwald is a 51 y.o. male admitted on 06/30/2017 with wound infection.  Pharmacy has been consulted for vancomycin and zosyn dosing.  Plan: 1. Vancomycin: continue vancomycin 1g IV Q8hr for goal trough of 15-20. Will obtain trough prior to 0200 dose on 8/2.   2. Zosyn: continue Zosyn 3.375g IV Q8hr.    Height: 5\' 11"  (180.3 cm) Weight: 218 lb 11.2 oz (99.2 kg) IBW/kg (Calculated) : 75.3  Temp (24hrs), Avg:98.8 F (37.1 C), Min:98.3 F (36.8 C), Max:99.4 F (37.4 C)   Recent Labs Lab 06/25/17 1422 06/30/17 1803 07/01/17 0329  WBC 15.6* 12.4* 8.4  CREATININE 0.92 0.95 0.91    Estimated Creatinine Clearance: 112.7 mL/min (by C-G formula based on SCr of 0.91 mg/dL).    No Known Allergies  Antimicrobials this admission: Vancomycin  8/1 >>  Zosyn 8/1 >>   Dose adjustments this admission: N/A  Microbiology results: None performed.   Thank you for allowing pharmacy to be a part of this patient's care.  Cleopatra CedarStephanie Lanice Folden  Pharmacy Resident  07/01/2017 9:56 AM

## 2017-07-01 NOTE — Consult Note (Addendum)
WOC Nurse wound consult note Reason for Consult:cellulitis of both legs Wound type: partial thickness Pressure Injury POA: NA Measurement:Left ankle wound circles the entire ankle area and is 15cm  X 25cm x 0.1cm.  The right ankle wound is only on the dorsal area and is 8cm x 8cm x 0.1cm. Surrounding skin is dry and flaking.  Wound bed: pink Drainage (amount, consistency, odor) scant Periwound: dry and peeling, left foot slightly edematous Dressing procedure/placement/frequency:I have provided nurses with orders for to bilateral ankle and pretibial wounds, cleanse with NS, pat dry, apply Xeroform gauze, wrap with kerlix, perform daily. I have performed first dressing today. Vascular has been consulted.  We will not follow, but will remain available to this patient, to nursing, and the medical and/or surgical teams.  Please re-consult if we need to assist further.    Barnett HatterMelinda Paola Flynt, RN-C, WTA-C, [facility] Wound Treatment Associate

## 2017-07-01 NOTE — Consult Note (Signed)
[facility] Vascular Consult Note  MRN : [id number]  Imagene RichesBennie D Sorter is a 51 y.o. (1966-07-04) male who presents with chief complaint of  Chief Complaint  Patient presents with  . Wound Infection  .  History of Present Illness: I am asked by Dr. Eliane DecreeS. Patel to see the patient For infection and ulceration of the left foot and ankle. The patient has diabetes and developed skin breakdown on the top of the left foot and around the left ankle. He wears steel toed boots at work and has not had any previous problem with his feet as far as he knows. He does have diabetes but does not have significant neuropathy. He denies claudication symptoms. He denies ischemic rest pain. He had what sounds like blistering that then more away and there was significant drainage and he has been started on antibiotics. We are asked to assess his perfusion. He reports having a study which was negative as well as some plain x-rays.  Current Facility-Administered Medications  Medication Dose Route Frequency Provider Last Rate Last Dose  . 0.9 %  sodium chloride infusion  250 mL Intravenous PRN Auburn BilberryPatel, Shreyang, MD      . acetaminophen (TYLENOL) tablet 650 mg  650 mg Oral Q6H PRN Auburn BilberryPatel, Shreyang, MD       Or  . acetaminophen (TYLENOL) suppository 650 mg  650 mg Rectal Q6H PRN Auburn BilberryPatel, Shreyang, MD      . aspirin EC tablet 81 mg  81 mg Oral Daily Auburn BilberryPatel, Shreyang, MD   81 mg at 07/01/17 0917  . enoxaparin (LOVENOX) injection 40 mg  40 mg Subcutaneous Q24H Auburn BilberryPatel, Shreyang, MD   40 mg at 06/30/17 2159  . fentaNYL (SUBLIMAZE) injection 50 mcg  50 mcg Intravenous Q1H PRN Willy Eddyobinson, Patrick, MD   50 mcg at 06/30/17 1902  . HYDROcodone-acetaminophen (NORCO/VICODIN) 5-325 MG per tablet 1-2 tablet  1-2 tablet Oral Q4H PRN Auburn BilberryPatel, Shreyang, MD      . insulin aspart (novoLOG) injection 0-9 Units  0-9 Units Subcutaneous TID WC Auburn BilberryPatel, Shreyang, MD      . insulin detemir (LEVEMIR) injection 15 Units  15 Units  Subcutaneous Q2200 Auburn BilberryPatel, Shreyang, MD   15 Units at 06/30/17 2159  . lisinopril (PRINIVIL,ZESTRIL) tablet 2.5 mg  2.5 mg Oral Daily Auburn BilberryPatel, Shreyang, MD   2.5 mg at 07/01/17 0917  . meloxicam (MOBIC) tablet 7.5 mg  7.5 mg Oral Daily Auburn BilberryPatel, Shreyang, MD   7.5 mg at 07/01/17 0917  . metFORMIN (GLUCOPHAGE) tablet 500 mg  500 mg Oral BID WC Auburn BilberryPatel, Shreyang, MD      . nicotine (NICODERM CQ - dosed in mg/24 hours) patch 21 mg  21 mg Transdermal Daily Auburn BilberryPatel, Shreyang, MD   21 mg at 07/01/17 0929  . ondansetron (ZOFRAN) tablet 4 mg  4 mg Oral Q6H PRN Auburn BilberryPatel, Shreyang, MD       Or  . ondansetron (ZOFRAN) injection 4 mg  4 mg Intravenous Q6H PRN Auburn BilberryPatel, Shreyang, MD      . piperacillin-tazobactam (ZOSYN) IVPB 3.375 g  3.375 g Intravenous Q8H Auburn BilberryPatel, Shreyang, MD 12.5 mL/hr at 07/01/17 1505 3.375 g at 07/01/17 1505  . simvastatin (ZOCOR) tablet 10 mg  10 mg Oral Daily Auburn BilberryPatel, Shreyang, MD   10 mg at 07/01/17 1037  . sodium chloride flush (NS) 0.9 % injection 3 mL  3 mL Intravenous Q12H Auburn BilberryPatel, Shreyang, MD   3 mL at 07/01/17 1248  . sodium chloride flush (NS) 0.9 % injection  3 mL  3 mL Intravenous PRN Auburn Bilberry, MD        Past Medical History:  Diagnosis Date  . Diabetes mellitus without complication (HCC)   . Hypertension     Past Surgical History:  Procedure Laterality Date  . KNEE SURGERY Right     Social History Social History  Substance Use Topics  . Smoking status: Current Every Day Smoker    Packs/day: 1.00    Types: Cigarettes  . Smokeless tobacco: Never Used  . Alcohol use Yes  Married  Family History Family History  Problem Relation Age of Onset  . Hypertension Father   No bleeding disorders, clotting disorders, aneurysms, or autoimmune diseases  No Known Allergies   REVIEW OF SYSTEMS (Negative unless checked)  Constitutional: [] Weight loss  [] Fever  [] Chills Cardiac: [] Chest pain   [] Chest pressure   [] Palpitations   [] Shortness of breath when laying flat   [] Shortness  of breath at rest   [] Shortness of breath with exertion. Vascular:  [] Pain in legs with walking   [] Pain in legs at rest   [] Pain in legs when laying flat   [] Claudication   [] Pain in feet when walking  [] Pain in feet at rest  [] Pain in feet when laying flat   [] History of DVT   [] Phlebitis   [x] Swelling in legs   [] Varicose veins   [x] Non-healing ulcers Pulmonary:   [] Uses home oxygen   [] Productive cough   [] Hemoptysis   [] Wheeze  [] COPD   [] Asthma Neurologic:  [] Dizziness  [] Blackouts   [] Seizures   [] History of stroke   [] History of TIA  [] Aphasia   [] Temporary blindness   [] Dysphagia   [] Weakness or numbness in arms   [] Weakness or numbness in legs Musculoskeletal:  [] Arthritis   [] Joint swelling   [] Joint pain   [] Low back pain Hematologic:  [] Easy bruising  [] Easy bleeding   [] Hypercoagulable state   [] Anemic  [] Hepatitis Gastrointestinal:  [] Blood in stool   [] Vomiting blood  [] Gastroesophageal reflux/heartburn   [] Difficulty swallowing. Genitourinary:  [] Chronic kidney disease   [] Difficult urination  [] Frequent urination  [] Burning with urination   [] Blood in urine Skin:  [] Rashes   [x] Ulcers   [x] Wounds Psychological:  [] History of anxiety   []  History of major depression.  Physical Examination  Vitals:   06/30/17 1752 06/30/17 2034 06/30/17 2055 07/01/17 0756  BP:  122/62 (!) 143/77 134/71  Pulse:  86 85 82  Resp:  17 19 18   Temp:  99.1 F (37.3 C) 98.3 F (36.8 C) 98.6 F (37 C)  TempSrc:  Oral Oral Oral  SpO2:  98% 100% 100%  Weight: 97.1 kg (214 lb)  99.2 kg (218 lb 11.2 oz)   Height:   5\' 11"  (1.803 m)    Body mass index is 30.5 kg/m. Gen:  WD/WN, NAD Head: [place]/AT, No temporalis wasting.  Ear/Nose/Throat: Hearing grossly intact, nares w/o erythema or drainage, oropharynx w/o Erythema/Exudate Eyes: Sclera non-icteric, conjunctiva clear Neck: Trachea midline.  No JVD.  Pulmonary:  Good air movement, respirations not labored, equal bilaterally.  Cardiac: RRR, normal S1,  S2. Vascular:  Vessel Right Left  Radial Palpable Palpable                          PT 2+ Palpable 2+ Palpable  DP 2+ Palpable 2+ Palpable    Musculoskeletal: M/S 5/5 throughout.  Extremities without ischemic changes.  Mild lower extremity edema. Neurologic: Sensation grossly intact in  extremities.  Symmetrical.  Speech is fluent. Motor exam as listed above. Psychiatric: Judgment intact, Mood & affect appropriate for pt's clinical situation. Dermatologic: Superficial ulcerations are present on the top of the left foot and from the medial malleolus to the lateral malleolus over the ankle. These are shallow without erythema or purulent drainage. Mild serous drainage.     CBC Lab Results  Component Value Date   WBC 8.4 07/01/2017   HGB 12.6 (L) 07/01/2017   HCT 36.4 (L) 07/01/2017   MCV 93.1 07/01/2017   PLT 287 07/01/2017    BMET    Component Value Date/Time   NA 139 07/01/2017 0329   K 3.4 (L) 07/01/2017 0329   CL 104 07/01/2017 0329   CO2 28 07/01/2017 0329   GLUCOSE 122 (H) 07/01/2017 0329   BUN 18 07/01/2017 0329   CREATININE 0.91 07/01/2017 0329   CREATININE 0.92 06/25/2017 1422   CALCIUM 9.1 07/01/2017 0329   GFRNONAA >60 07/01/2017 0329   GFRNONAA >89 06/25/2017 1422   GFRAA >60 07/01/2017 0329   GFRAA >89 06/25/2017 1422   Estimated Creatinine Clearance: 112.7 mL/min (by C-G formula based on SCr of 0.91 mg/dL).  COAG No results found for: INR, PROTIME  Radiology Dg Ankle Complete Left  Result Date: 06/30/2017 CLINICAL DATA:  Left ankle is wrong with red skin we pain. Scabbed skin is noted. EXAM: LEFT ANKLE COMPLETE - 3+ VIEW COMPARISON:  None. FINDINGS: No acute fracture. No dislocation. Diffuse soft tissue swelling about the ankle is present. No destructive bone lesion. No obvious abnormal periosteum reaction. Mild degenerative changes in the ankle joint and midfoot. IMPRESSION: No acute bony pathology. Soft tissue swelling about the ankle is noted.  Electronically Signed   By: Jolaine ClickArthur  Hoss M.D.   On: 06/30/2017 19:18   Dg Ankle Complete Left  Result Date: 06/25/2017 CLINICAL DATA:  51 year old male with a history of left ankle swelling EXAM: LEFT ANKLE COMPLETE - 3+ VIEW COMPARISON:  None. FINDINGS: No acute displaced fracture. Lateral view demonstrates joint effusion at the tibiotalar joint. Degenerative changes of the midfoot. Ankle mortise is congruent.  No radiopaque foreign body. IMPRESSION: Tibiotalar joint effusion. No acute bony abnormality. Electronically Signed   By: Gilmer MorJaime  Wagner D.O.   On: 06/25/2017 15:30   [place] Venous Img Lower Unilateral Left  Result Date: 06/25/2017 CLINICAL DATA:  Left ankle edema and pain EXAM: Left LOWER EXTREMITY VENOUS DOPPLER ULTRASOUND TECHNIQUE: Gray-scale sonography with graded compression, as well as color Doppler and duplex ultrasound were performed to evaluate the lower extremity deep venous systems from the level of the common femoral vein and including the common femoral, femoral, profunda femoral, popliteal and calf veins including the posterior tibial, peroneal and gastrocnemius veins when visible. The superficial great saphenous vein was also interrogated. Spectral Doppler was utilized to evaluate flow at rest and with distal augmentation maneuvers in the common femoral, femoral and popliteal veins. COMPARISON:  Radiograph 06/25/2017 FINDINGS: Contralateral Common Femoral Vein: Respiratory phasicity is normal and symmetric with the symptomatic side. No evidence of thrombus. Normal compressibility. Common Femoral Vein: No evidence of thrombus. Normal compressibility, respiratory phasicity and response to augmentation. Saphenofemoral Junction: No evidence of thrombus. Normal compressibility and flow on color Doppler imaging. Profunda Femoral Vein: No evidence of thrombus. Normal compressibility and flow on color Doppler imaging. Femoral Vein: No evidence of thrombus. Normal compressibility, respiratory  phasicity and response to augmentation. Popliteal Vein: No evidence of thrombus. Normal compressibility, respiratory phasicity and response to augmentation. Calf Veins:  No evidence of thrombus. Normal compressibility and flow on color Doppler imaging. Other Findings: Mildly prominent inguinal lymph node measuring 2 x 0.9 x 1.8 cm with fatty hilus. Large slightly complex fluid collection in the popliteal fossa measuring 6.3 x 3 x 5.7 cm. IMPRESSION: 1. No evidence of DVT within the left lower extremity. 2. Large 6.3 cm popliteal fossa cyst 3. Mildly prominent left inguinal lymph node Electronically Signed   By: Jasmine Pang M.D.   On: 06/25/2017 16:38      Assessment/Plan 1. Left foot and ankle ulcerations. He has normal, palpable pedal pulses and the location would not be typical for arterial insufficiency. I do not think this is related to peripheral arterial disease. He does have a significant Baker cyst on the left which is likely contributing to his lower extremity swelling and may have helped contributed to his ulceration. He does not have a DVT, but certainly venous reflux could be present as our institution does not assess for venous reflux. I will be happy to see him in the office for this. I would consider Unna boots at discharge for treatment of his left foot and ankle ulceration I would also be happy to change these on a weekly basis in our office if desired. He should consider regular podiatry appointments given his diabetes, but I do not think debridement or surgery at this time is likely necessary. 2. Diabetes mellitus. Blood glucose control is important for wound healing as well as avoidance of atherosclerosis. On appropriate medications 3. Tobacco dependence. Smoking cessation of benefit and clearly represents a risk factor for development of atherosclerotic peripheral arterial disease.   Festus Barren, MD  07/01/2017 3:27 PM    This note was created with Dragon medical transcription  system.  Any error is purely unintentional

## 2017-07-01 NOTE — Progress Notes (Signed)
Patient alert and oriented, denies any at this time, vss, waiting for dr Marcene Duoskalliseti. Continue with IV ABT, denies any pain at this time

## 2017-07-01 NOTE — Progress Notes (Signed)
[facility] [place] [facility]   PATIENT NAME: Donald [name]    MR#:  [id number]  DATE OF BIRTH:  [birth year]  SUBJECTIVE:  CHIEF COMPLAINT:   Chief Complaint  Patient presents with  . Wound Infection    REVIEW OF SYSTEMS:  Review of Systems  Constitutional: Negative for chills, fever and malaise/fatigue.  HENT: Negative for congestion, ear discharge, hearing loss and nosebleeds.   Eyes: Negative for blurred vision and double vision.  Respiratory: Negative for cough, shortness of breath and wheezing.   Cardiovascular: Negative for chest pain, palpitations and leg swelling.  Gastrointestinal: Negative for abdominal pain, constipation, diarrhea, nausea and vomiting.  Genitourinary: Negative for dysuria.  Musculoskeletal: Negative for myalgias.  Skin: Positive for rash.  Neurological: Negative for dizziness, sensory change, speech change, focal weakness, seizures and headaches.  Psychiatric/Behavioral: Negative for depression.    DRUG ALLERGIES:  No Known Allergies  VITALS:  Blood pressure 134/71, pulse 82, temperature 98.6 F (37 C), temperature source Oral, resp. rate 18, height 5\' 11"  (1.803 m), weight 99.2 kg (218 lb 11.2 oz), SpO2 100 %.  PHYSICAL EXAMINATION:  Physical Exam  GENERAL:  51 y.o.-year-old patient lying in the bed with no acute distress.  EYES: Pupils equal, round, reactive to light and accommodation. No scleral icterus. Extraocular muscles intact.  HEENT: Head atraumatic, normocephalic. Oropharynx and nasopharynx clear.  NECK:  Supple, no jugular venous distention. No thyroid enlargement, no tenderness.  LUNGS: Normal breath sounds bilaterally, no wheezing, rales,rhonchi or crepitation. No use of accessory muscles of respiration.  CARDIOVASCULAR: S1, S2 normal. No murmurs, rubs, or gallops.  ABDOMEN: Soft, nontender, nondistended. Bowel sounds present. No organomegaly or mass.  EXTREMITIES: No pedal edema, cyanosis, or clubbing.  Both ankles- dressing in place NEUROLOGIC: Cranial nerves II through XII are intact. Muscle strength 5/5 in all extremities. Sensation intact. Gait not checked.  PSYCHIATRIC: The patient is alert and oriented x 3.  SKIN: No obvious rash, lesion, or ulcer.    LABORATORY PANEL:   CBC  Recent Labs Lab 07/01/17 0329  WBC 8.4  HGB 12.6*  HCT 36.4*  PLT 287   ------------------------------------------------------------------------------------------------------------------  Chemistries   Recent Labs Lab 06/30/17 1803 07/01/17 0329  NA 142 139  K 4.1 3.4*  CL 104 104  CO2 29 28  GLUCOSE 102* 122*  BUN 17 18  CREATININE 0.95 0.91  CALCIUM 9.7 9.1  AST 14*  --   ALT 14*  --   ALKPHOS 72  --   BILITOT 0.5  --    ------------------------------------------------------------------------------------------------------------------  Cardiac Enzymes No results for input(s): TROPONINI in the last 168 hours. ------------------------------------------------------------------------------------------------------------------  RADIOLOGY:  Dg Ankle Complete Left  Result Date: 06/30/2017 CLINICAL DATA:  Left ankle is wrong with red skin we pain. Scabbed skin is noted. EXAM: LEFT ANKLE COMPLETE - 3+ VIEW COMPARISON:  None. FINDINGS: No acute fracture. No dislocation. Diffuse soft tissue swelling about the ankle is present. No destructive bone lesion. No obvious abnormal periosteum reaction. Mild degenerative changes in the ankle joint and midfoot. IMPRESSION: No acute bony pathology. Soft tissue swelling about the ankle is noted. Electronically Signed   By: Jolaine ClickArthur  Hoss M.D.   On: 06/30/2017 19:18    EKG:   Orders placed or performed during the hospital encounter of 07/22/16  . EKG 12-Lead  . EKG 12-Lead  . EKG    ASSESSMENT AND PLAN:   51 year old male with past medical history significant for insulin-dependent diabetes mellitus, hypertension presents to  hospital secondary to rash  and redness around both his ankles.  #1 bilateral lower extremity rash/cellulitis-appreciate podiatry consult. -Has good blood flow, likely would not need an angiogram. -Possible dermatitis according to podiatry. Continue vancomycin and Zosyn for now to treat superinfection -Continue dressing changes. It does not improve, outpatient dermatology follow-up -Dopplers negative for any DVT.  #2 Type 2 diabetes mellitus-continue sliding scale insulin. Patient on Glucophage and Levemir  #3 HTN- on low-dose lisinopril. -since edema has improved, discontinue Lasix  #4  DVT prophylaxis-Lovenox  #5 tobacco use disorder-started on nicotine patch     All the records are reviewed and case discussed with Care Management/Social Workerr. Management plans discussed with the patient, family and they are in agreement.  CODE STATUS: Full Code  TOTAL TIME TAKING CARE OF THIS PATIENT: 37 minutes.   POSSIBLE D/C IN 1-2 DAYS, DEPENDING ON CLINICAL CONDITION.   Wynton Hufstetler M.D on 07/01/2017 at 2:17 PM  Between 7am to 6pm - Pager - [phone]  After 6pm go to [email or web address] - Social research officer, governmentpassword [email or web address]  [facility]  Office  [phone]  CC: Primary care physician; Ellyn HackShah, Syed Asad A, MD

## 2017-07-01 NOTE — Consult Note (Signed)
Reason for Consult: Ulcerations bilateral ankles Referring Physician: [name] is an 51 y.o. male.  HPI: This is a 51 year old male who relates some blisters that started to form last Thursday. He denies any specific injury. Denies recently getting any new pairs of shoes or socks. 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 States he has recently been seen by his primary care on a couple of occasions. Had an ultrasound to rule out DVT on the left lower extremity. Due to the continued progression of the ulceration he was referred to the emergency department where he was admitted for IV antibiotics and evaluation.  Past Medical History:  Diagnosis Date  . Diabetes mellitus without complication ([place])   . Hypertension     Past Surgical History:  Procedure Laterality Date  . KNEE SURGERY Right     Family History  Problem Relation Age of Onset  . Hypertension Father     Social History:  reports that he has been smoking Cigarettes.  He has been smoking about 1.00 pack per day. He has never used smokeless tobacco. He reports that he drinks alcohol. He reports that he does not use drugs.  Allergies: No Known Allergies  Medications:  Scheduled: . aspirin EC  81 mg Oral Daily  . enoxaparin (LOVENOX) injection  40 mg Subcutaneous Q24H  . furosemide  20 mg Oral Daily  . insulin aspart  0-9 Units Subcutaneous TID WC  . insulin detemir  15 Units Subcutaneous Q2200  . lisinopril  2.5 mg Oral Daily  . meloxicam  7.5 mg Oral Daily  . metFORMIN  500 mg Oral BID WC  . nicotine  21 mg Transdermal Daily  . simvastatin  10 mg Oral Daily  . sodium chloride flush  3 mL Intravenous Q12H    Results for orders placed or performed during the hospital encounter of 06/30/17 (from the past 48 hour(s))  Comprehensive metabolic panel     Status: Abnormal   Collection Time: 06/30/17  6:03 PM  Result Value Ref Range   Sodium 142 135 - 145  mmol/L   Potassium 4.1 3.5 - 5.1 mmol/L   Chloride 104 101 - 111 mmol/L   CO2 29 22 - 32 mmol/L   Glucose, Bld 102 (H) 65 - 99 mg/dL   BUN 17 6 - 20 mg/dL   Creatinine, Ser 0.95 0.61 - 1.24 mg/dL   Calcium 9.7 8.9 - 10.3 mg/dL   Total Protein 8.3 (H) 6.5 - 8.1 g/dL   Albumin 4.0 3.5 - 5.0 g/dL   AST 14 (L) 15 - 41 U/L   ALT 14 (L) 17 - 63 U/L   Alkaline Phosphatase 72 38 - 126 U/L   Total Bilirubin 0.5 0.3 - 1.2 mg/dL   GFR calc non Af Amer >60 >60 mL/min   GFR calc Af Amer >60 >60 mL/min    Comment: (NOTE) The eGFR has been calculated using the CKD EPI equation. This calculation has not been validated in all clinical situations. eGFR's persistently <60 mL/min signify possible Chronic Kidney Disease.    Anion gap 9 5 - 15  CBC with Differential     Status: Abnormal   Collection Time: 06/30/17  6:03 PM  Result Value Ref Range   WBC 12.4 (H) 3.8 - 10.6 K/uL   RBC 4.23 (L) 4.40 - 5.90 MIL/uL   Hemoglobin 13.7 13.0 - 18.0 g/dL   HCT 39.6 (L) 40.0 - 52.0 %  MCV 93.5 80.0 - 100.0 fL   MCH 32.3 26.0 - 34.0 pg   MCHC 34.6 32.0 - 36.0 g/dL   RDW 13.6 11.5 - 14.5 %   Platelets 325 150 - 440 K/uL   Neutrophils Relative % 64 %   Neutro Abs 8.0 (H) 1.4 - 6.5 K/uL   Lymphocytes Relative 25 %   Lymphs Abs 3.1 1.0 - 3.6 K/uL   Monocytes Relative 8 %   Monocytes Absolute 0.9 0.2 - 1.0 K/uL   Eosinophils Relative 2 %   Eosinophils Absolute 0.2 0 - 0.7 K/uL   Basophils Relative 1 %   Basophils Absolute 0.1 0 - 0.1 K/uL  Glucose, capillary     Status: Abnormal   Collection Time: 06/30/17  9:11 PM  Result Value Ref Range   Glucose-Capillary 122 (H) 65 - 99 mg/dL   Comment 1 Notify RN   CBC     Status: Abnormal   Collection Time: 07/01/17  3:29 AM  Result Value Ref Range   WBC 8.4 3.8 - 10.6 K/uL   RBC 3.91 (L) 4.40 - 5.90 MIL/uL   Hemoglobin 12.6 (L) 13.0 - 18.0 g/dL   HCT 36.4 (L) 40.0 - 52.0 %   MCV 93.1 80.0 - 100.0 fL   MCH 32.3 26.0 - 34.0 pg   MCHC 34.7 32.0 - 36.0 g/dL    RDW 13.8 11.5 - 14.5 %   Platelets 287 150 - 440 K/uL  Basic metabolic panel     Status: Abnormal   Collection Time: 07/01/17  3:29 AM  Result Value Ref Range   Sodium 139 135 - 145 mmol/L   Potassium 3.4 (L) 3.5 - 5.1 mmol/L   Chloride 104 101 - 111 mmol/L   CO2 28 22 - 32 mmol/L   Glucose, Bld 122 (H) 65 - 99 mg/dL   BUN 18 6 - 20 mg/dL   Creatinine, Ser 0.91 0.61 - 1.24 mg/dL   Calcium 9.1 8.9 - 10.3 mg/dL   GFR calc non Af Amer >60 >60 mL/min   GFR calc Af Amer >60 >60 mL/min    Comment: (NOTE) The eGFR has been calculated using the CKD EPI equation. This calculation has not been validated in all clinical situations. eGFR's persistently <60 mL/min signify possible Chronic Kidney Disease.    Anion gap 7 5 - 15  Glucose, capillary     Status: None   Collection Time: 07/01/17  7:38 AM  Result Value Ref Range   Glucose-Capillary 86 65 - 99 mg/dL  Glucose, capillary     Status: None   Collection Time: 07/01/17 11:44 AM  Result Value Ref Range   Glucose-Capillary 94 65 - 99 mg/dL    Dg Ankle Complete Left  Result Date: 06/30/2017 CLINICAL DATA:  Left ankle is wrong with red skin we pain. Scabbed skin is noted. EXAM: LEFT ANKLE COMPLETE - 3+ VIEW COMPARISON:  None. FINDINGS: No acute fracture. No dislocation. Diffuse soft tissue swelling about the ankle is present. No destructive bone lesion. No obvious abnormal periosteum reaction. Mild degenerative changes in the ankle joint and midfoot. IMPRESSION: No acute bony pathology. Soft tissue swelling about the ankle is noted. Electronically Signed   By: Marybelle Killings M.D.   On: 06/30/2017 19:18    Review of Systems  Constitutional: Negative for chills and fever.  HENT: Negative.   Eyes: Negative.   Respiratory: Negative.   Cardiovascular: Negative.   Gastrointestinal: Negative for nausea and vomiting.  Genitourinary: Negative.  Musculoskeletal: Negative.   Skin:       Recent blistering with open wounds on both of his ankles  left greater than right.  Neurological:       Denies numbness or paresthesias related to his diabetes.  Endo/Heme/Allergies: Negative.   Psychiatric/Behavioral: Negative.    Blood pressure 134/71, pulse 82, temperature 98.6 F (37 C), temperature source Oral, resp. rate 18, height _0  (1.803 m), weight 99.2 kg (218 lb 11.2 oz), SpO2 100 %. Physical Exam  Cardiovascular:  DP and PT pulses are palpable.  Musculoskeletal:  Adequate range of motion and muscle testing.  Neurological:  Protective threshold with a monofilament wire grossly intact and symmetric. Proprioception intact  Skin:  The skin is warm dry and supple. Large superficial and granular ulceration noted in circumference around the left ankle. A smaller, again superficial ulceration noted along the anterior aspect of the right ankle. Minimal drainage with no signs of purulence. Some mild increased erythema and edema in the left foot and lower leg as compared to the right.    Assessment/Plan: Assessment: 1. Bilateral ankle ulcerations, most consistent with some type of contact dermatitis 2. Diabetes without evidence for neuropathy  Plan: Replaced the Xeroform and Kerlix wrap to both of the ankles. Would continue with his current wound care orders. Again discussed with the patient that it appears to be some type of a contact irritation. At this point is recommend follow-up with his primary care or with [place] as an outpatient.    Durward Fortes 07/01/2017, 1:32 PM

## 2017-07-02 ENCOUNTER — Other Ambulatory Visit: Payer: Self-pay | Admitting: Family Medicine

## 2017-07-02 LAB — BASIC METABOLIC PANEL
ANION GAP: 7 (ref 5–15)
BUN: 16 mg/dL (ref 6–20)
CHLORIDE: 104 mmol/L (ref 101–111)
CO2: 29 mmol/L (ref 22–32)
Calcium: 9.3 mg/dL (ref 8.9–10.3)
Creatinine, Ser: 0.9 mg/dL (ref 0.61–1.24)
GFR calc non Af Amer: >60 mL/min (ref >60)
GLUCOSE: 137 mg/dL — AB (ref 65–99)
POTASSIUM: 4.3 mmol/L (ref 3.5–5.1)
Sodium: 140 mmol/L (ref 135–145)

## 2017-07-02 LAB — GLUCOSE, CAPILLARY
GLUCOSE-CAPILLARY: 100 mg/dL — AB (ref 65–99)
Glucose-Capillary: 129 mg/dL — ABNORMAL HIGH (ref 65–99)

## 2017-07-02 LAB — VANCOMYCIN, TROUGH: Vancomycin Tr: 9 ug/mL — ABNORMAL LOW (ref 15–20)

## 2017-07-02 MED ORDER — FUROSEMIDE 20 MG PO TABS: 20.0000 mg | ORAL_TABLET | ORAL | 0 refills | Status: DC

## 2017-07-02 MED ORDER — SULFAMETHOXAZOLE-TRIMETHOPRIM 400-80 MG PO TABS: 1.0000 | ORAL_TABLET | Freq: Two times a day (BID) | ORAL | 0 refills | Status: DC

## 2017-07-02 NOTE — Progress Notes (Signed)
Pharmacy Antibiotic Note  Donald [name] is a 51 y.o. male admitted on 06/30/2017 with bilateral lower extremity rash/cellulitis.  Pharmacy was consulted for Zosyn dosing on 7/31.    Plan: 1. Zosyn 3.375 gm IV Q8H EI   Weight: 218 lb (99.2 kg)  CrCl: 114 ml/min SrCr: 0.90    Temp (24hrs), Avg:98.2 F (36.8 C), Min:97.8 F (36.6 C), Max:98.5 F (36.9 C)   Recent Labs Lab 06/25/17 1422 06/30/17 1803 07/01/17 0329 07/02/17 0134  WBC 15.6* 12.4* 8.4  --   CREATININE 0.92 0.95 0.91 0.90  VANCOTROUGH  --   --   --  9*    Estimated Creatinine Clearance: 114 mL/min (by C-G formula based on SCr of 0.9 mg/dL).    No Known Allergies  Antimicrobials this admission: Vancomycin 7/31 >> 8/1 Zosyn 8/1 >>   Microbiology results: None performed    Thank you for allowing pharmacy to be a part of this patient's care.  Cleopatra CedarStephanie Reeve Mallo  Pharmacy Resident  07/02/2017 10:20 AM

## 2017-07-02 NOTE — Progress Notes (Signed)
Patient is alert and oriented and able to verbalize needs. No complaints of pain at this time. PIV removed. VS stable. Discharge instructions gone over with patient at this time. Pt verbalizes understanding of all discharge instructions. No concerns voiced. Work noted provided to pt. Printed AVS given to patient. Daughter providing transportation home. Pt taken to car via wc.   Suzan SlickAlison L Yutaka Holberg, RN

## 2017-07-02 NOTE — Progress Notes (Signed)
     Donald [name] was admitted to the [facility] on 06/30/2017 for an acute medical condition and is being Discharged on  07/02/2017 . He will need another 2-3 days for recovery and so advised to stay away from work until then. So please excuse him from work for the above  Days. Should be able to return to work without any restrictions from 07/06/17.  Call Enid Baasadhika Lakeasha Petion  MD, [facility] at  [phone] with questions.  Enid BaasKALISETTI,Aaryav Hopfensperger M.D on 07/02/2017,at 2:28 PM  [facility] [address], [place] [id number]

## 2017-07-02 NOTE — Discharge Summary (Signed)
[place] [facility] [place] NAME: Donald [name]    MR#:  [id number]  DATE OF BIRTH:  [birth year]  DATE OF ADMISSION:  06/30/2017   ADMITTING PHYSICIAN: Dustin Flock, MD  DATE OF DISCHARGE: 07/02/2017  2:45 PM  PRIMARY CARE PHYSICIAN: Roselee Nova, MD   ADMISSION DIAGNOSIS:   Cellulitis of left lower extremity [L03.116]  DISCHARGE DIAGNOSIS:   Active Problems:   Left foot infection   SECONDARY DIAGNOSIS:   Past Medical History:  Diagnosis Date  . Diabetes mellitus without complication ([place])   . Hypertension     HOSPITAL COURSE:   51 year old male with past medical history significant for insulin-dependent diabetes mellitus, hypertension presents to hospital secondary to rash and redness around both his ankles.  #1 bilateral lower extremity rash/cellulitis-appreciate podiatry consult. -Has good blood flow, appreciate vascular input- no indication for angiogram. -Possible contact dermatitis according to podiatry.  - received vancomycin and Zosyn here with improvement- superficial lesions- now scabbing -Being discharged on Bactrim -Continue dressing changes as needed. It does not improve, outpatient dermatology follow-up -Dopplers negative for any DVT.  #2 Type 2 diabetes mellitus-continue  Glucophage and Levemir Most recent A1c 6.6  #3 HTN- on low-dose lisinopril. -Lasix has been changed to every other day at this time.  #4 tobacco use disorder-counseled in the hospital. Was on nicotine patch while here  DISCHARGE CONDITIONS:   Guarded CONSULTS OBTAINED:   Treatment Team:  Algernon Huxley, MD Sharlotte Alamo, DPM  DRUG ALLERGIES:   No Known Allergies DISCHARGE MEDICATIONS:   Allergies as of 07/02/2017   No Known Allergies     Medication List    TAKE these medications   aspirin 81 MG EC tablet Take 1 tablet (81 mg total) by mouth daily.   D-CARE GLUCOMETER w/Device Kit 1 Units by Does not apply route 4 (four)  times daily -  before meals and at bedtime.   furosemide 20 MG tablet Commonly known as:  LASIX Take 1 tablet (20 mg total) by mouth every other day. What changed:  when to take this   Insulin Detemir 100 UNIT/ML Pen Commonly known as:  LEVEMIR FLEXPEN Inject 15 Units into the skin daily at 10 pm.   lisinopril 2.5 MG tablet Commonly known as:  PRINIVIL,ZESTRIL Take 1 tablet (2.5 mg total) by mouth daily.   meloxicam 7.5 MG tablet Commonly known as:  MOBIC Take 7.5 mg by mouth daily.   metFORMIN 500 MG tablet Commonly known as:  GLUCOPHAGE TAKE 1 TABLET (500 MG TOTAL) BY MOUTH 2 (TWO) TIMES DAILY WITH A MEAL.   simvastatin 10 MG tablet Commonly known as:  ZOCOR TAKE 1 TABLET (10 MG TOTAL) BY MOUTH DAILY.   sulfamethoxazole-trimethoprim 400-80 MG tablet Commonly known as:  BACTRIM Take 1 tablet by mouth 2 (two) times daily. X 7 days   Vitamin D (Ergocalciferol) 50000 units Caps capsule Commonly known as:  DRISDOL Take 1 capsule (50,000 Units total) by mouth once a week. For 12 weeks What changed:  additional instructions        DISCHARGE INSTRUCTIONS:   1. PCP follow-up in 1-2 weeks 2. Podiatry follow-up in 2 weeks  DIET:   Cardiac diet and Diabetic diet  ACTIVITY:   Activity as tolerated  OXYGEN:   Home Oxygen: No.  Oxygen Delivery: room air  DISCHARGE LOCATION:   home   If you experience worsening of your admission symptoms, develop shortness of breath, life threatening emergency, suicidal  or homicidal thoughts you must seek medical attention immediately by calling 911 or calling your MD immediately  if symptoms less severe.  You Must read complete instructions/literature along with all the possible adverse reactions/side effects for all the Medicines you take and that have been prescribed to you. Take any new Medicines after you have completely understood and accpet all the possible adverse reactions/side effects.   Please note  You were cared for  by a hospitalist during your hospital stay. If you have any questions about your discharge medications or the care you received while you were in the hospital after you are discharged, you can call the unit and asked to speak with the hospitalist on call if the hospitalist that took care of you is not available. Once you are discharged, your primary care physician will handle any further medical issues. Please note that NO REFILLS for any discharge medications will be authorized once you are discharged, as it is imperative that you return to your primary care physician (or establish a relationship with a primary care physician if you do not have one) for your aftercare needs so that they can reassess your need for medications and monitor your lab values.    On the day of Discharge:  VITAL SIGNS:   Blood pressure 120/68, pulse 80, temperature 98.5 F (36.9 C), temperature source Oral, resp. rate 16, height _0  (1.803 m), weight 99.2 kg (218 lb 11.2 oz), SpO2 97 %.  PHYSICAL EXAMINATION:    GENERAL:  51 y.o.-year-old patient lying in the bed with no acute distress.  EYES: Pupils equal, round, reactive to light and accommodation. No scleral icterus. Extraocular muscles intact.  HEENT: Head atraumatic, normocephalic. Oropharynx and nasopharynx clear.  NECK:  Supple, no jugular venous distention. No thyroid enlargement, no tenderness.  LUNGS: Normal breath sounds bilaterally, no wheezing, rales,rhonchi or crepitation. No use of accessory muscles of respiration.  CARDIOVASCULAR: S1, S2 normal. No murmurs, rubs, or gallops.  ABDOMEN: Soft, nontender, nondistended. Bowel sounds present. No organomegaly or mass.  EXTREMITIES: No pedal edema, cyanosis, or clubbing. Both ankles- superficial lesions- scabbing now NEUROLOGIC: Cranial nerves II through XII are intact. Muscle strength 5/5 in all extremities. Sensation intact. Gait not checked.  PSYCHIATRIC: The patient is alert and oriented x 3.  SKIN: No  obvious rash, lesion, or ulcer  DATA REVIEW:   CBC  Recent Labs Lab 07/01/17 0329  WBC 8.4  HGB 12.6*  HCT 36.4*  PLT 287    Chemistries   Recent Labs Lab 06/30/17 1803  07/02/17 0134  NA 142  < > 140  K 4.1  < > 4.3  CL 104  < > 104  CO2 29  < > 29  GLUCOSE 102*  < > 137*  BUN 17  < > 16  CREATININE 0.95  < > 0.90  CALCIUM 9.7  < > 9.3  AST 14*  --   --   ALT 14*  --   --   ALKPHOS 72  --   --   BILITOT 0.5  --   --   < > = values in this interval not displayed.   Microbiology Results  Results for orders placed or performed during the hospital encounter of 07/22/16  MRSA PCR Screening     Status: None   Collection Time: 07/22/16  2:15 PM  Result Value Ref Range Status   MRSA by PCR NEGATIVE NEGATIVE Final    Comment:  The GeneXpert MRSA Assay (FDA approved for NASAL specimens only), is one component of a comprehensive MRSA colonization surveillance program. It is not intended to diagnose MRSA infection nor to guide or monitor treatment for MRSA infections.     RADIOLOGY:  No results found.   Management plans discussed with the patient, family and they are in agreement.  CODE STATUS:     Code Status Orders        Start     Ordered   06/30/17 2036  Full code  Continuous     06/30/17 2035    Code Status History    Date Active Date Inactive Code Status Order ID Comments User Context   07/22/2016 10:16 AM 07/23/2016  8:25 PM Full Code [id number]  Hillary Bow, MD ED      TOTAL TIME TAKING CARE OF THIS PATIENT: 37 minutes.    Gladstone Lighter M.D on 07/02/2017 at 4:32 PM  Between 7am to 6pm - Pager - [phone]  After 6pm go to [email or web address] - [employer] [facility]  Office  [phone]  CC: Primary care physician; Roselee Nova, MD   Note: This dictation was prepared with Dragon dictation along with smaller phrase technology. Any transcriptional errors that result from this process  are unintentional.

## 2017-07-03 ENCOUNTER — Ambulatory Visit (INDEPENDENT_AMBULATORY_CARE_PROVIDER_SITE_OTHER): Payer: BLUE CROSS/BLUE SHIELD | Admitting: Family Medicine

## 2017-07-03 ENCOUNTER — Encounter: Payer: Self-pay | Admitting: Family Medicine

## 2017-07-03 VITALS — BP 138/79 | HR 113 | Temp 98.8°F | Resp 17 | Ht 71.0 in | Wt 219.4 lb

## 2017-07-03 DIAGNOSIS — R590 Localized enlarged lymph nodes: Secondary | ICD-10-CM

## 2017-07-03 DIAGNOSIS — L03116 Cellulitis of left lower limb: Secondary | ICD-10-CM | POA: Diagnosis not present

## 2017-07-03 NOTE — Progress Notes (Signed)
Name: Donald [name]   MRN: [id number]    DOB: [birth year]   Date:07/03/2017       Progress Note  Subjective  Chief Complaint  Chief Complaint  Patient presents with  . Hospitalization Follow-up    infection    HPI  Pt. Presents for hospital follow up, admitted for left lower extremity cellulitis/contact dermatitis, swelling in the left ankle, started on IV antibiotics at the hospital, podiatry consult was called, indicated possible contact dermatitis, dressing was aplied to the area, discharged on 7 days of Bactrim. He feels better, still painful when tries to stand on his foot for long periods, is scheduled to follow up with podiatry and vascular surgery as outpatient.     Past Medical History:  Diagnosis Date  . Diabetes mellitus without complication ([place])   . Hypertension     Past Surgical History:  Procedure Laterality Date  . KNEE SURGERY Right     Family History  Problem Relation Age of Onset  . Hypertension Father     Social History   Social History  . Marital status: Married    Spouse name: N/A  . Number of children: N/A  . Years of education: N/A   Occupational History  . Not on file.   Social History Main Topics  . Smoking status: Current Every Day Smoker    Packs/day: 1.00    Types: Cigarettes  . Smokeless tobacco: Never Used  . Alcohol use Yes  . Drug use: No  . Sexual activity: Yes    Partners: Female   Other Topics Concern  . Not on file   Social History Narrative  . No narrative on file     Current Outpatient Prescriptions:  .  aspirin EC 81 MG EC tablet, Take 1 tablet (81 mg total) by mouth daily., Disp: , Rfl:  .  Blood Glucose Monitoring Suppl (D-CARE GLUCOMETER) w/Device KIT, 1 Units by Does not apply route 4 (four) times daily -  before meals and at bedtime., Disp: 1 kit, Rfl: 0 .  furosemide (LASIX) 20 MG tablet, Take 1 tablet (20 mg total) by mouth every other day., Disp: 30 tablet, Rfl: 0 .  Insulin Detemir (LEVEMIR FLEXPEN) 100  UNIT/ML Pen, Inject 15 Units into the skin daily at 10 pm., Disp: 15 mL, Rfl: 1 .  lisinopril (PRINIVIL,ZESTRIL) 2.5 MG tablet, Take 1 tablet (2.5 mg total) by mouth daily., Disp: 90 tablet, Rfl: 0 .  meloxicam (MOBIC) 7.5 MG tablet, TAKE 1 TABLET BY MOUTH EVERY DAY, Disp: 30 tablet, Rfl: 1 .  metFORMIN (GLUCOPHAGE) 500 MG tablet, TAKE 1 TABLET (500 MG TOTAL) BY MOUTH 2 (TWO) TIMES DAILY WITH A MEAL., Disp: 180 tablet, Rfl: 0 .  simvastatin (ZOCOR) 10 MG tablet, TAKE 1 TABLET (10 MG TOTAL) BY MOUTH DAILY., Disp: 90 tablet, Rfl: 0 .  sulfamethoxazole-trimethoprim (BACTRIM) 400-80 MG tablet, Take 1 tablet by mouth 2 (two) times daily. X 7 days, Disp: 14 tablet, Rfl: 0 .  Vitamin D, Ergocalciferol, (DRISDOL) 50000 units CAPS capsule, Take 1 capsule (50,000 Units total) by mouth once a week. For 12 weeks (Patient taking differently: Take 50,000 Units by mouth once a week. Take tab every Monday For 12 weeks), Disp: 12 capsule, Rfl: 0  No Known Allergies   ROS  Please see history of present illness for complete discussion of ROS  Objective  Vitals:   07/03/17 0948  BP: 138/79  Pulse: (!) 113  Resp: 17  Temp: 98.8 F (37.1 C)  TempSrc: Oral  SpO2: 99%  Weight: 219 lb 6.4 oz (99.5 kg)  Height: _0  (1.803 m)    Physical Exam  Constitutional: He is oriented to person, place, and time and well-developed, well-nourished, and in no distress.  HENT:  Head: Normocephalic and atraumatic.  Cardiovascular: Normal rate, regular rhythm and normal heart sounds.   No murmur heard. Pulmonary/Chest: Effort normal and breath sounds normal. He has no wheezes.  Musculoskeletal: He exhibits edema.  Neurological: He is alert and oriented to person, place, and time.  Skin:     Healing ulceration in the left lower extremity with scab formation over previously open draining wounds, new skin formation visible underneath the haling lesions, no further drainage.    Nursing note and vitals  reviewed.   Assessment & Plan  1. Cellulitis of left lower leg Reviewed notes, off of IV antibiotics and now on by mouth, seen by podiatry and vascular surgery, has scheduled follow-ups with both specialists. Continue present management, work note provided to cover for Monday, August 6th.  2. Lymphadenopathy, inguinal Likely reactive because of infection and lower extremity, reassess in 2 weeks  Ashleymarie Granderson [facility] 07/03/2017 9:56 AM

## 2017-07-06 ENCOUNTER — Encounter: Payer: Self-pay | Admitting: Family Medicine

## 2017-07-06 ENCOUNTER — Ambulatory Visit (INDEPENDENT_AMBULATORY_CARE_PROVIDER_SITE_OTHER): Payer: BLUE CROSS/BLUE SHIELD | Admitting: Family Medicine

## 2017-07-06 DIAGNOSIS — I739 Peripheral vascular disease, unspecified: Secondary | ICD-10-CM | POA: Diagnosis not present

## 2017-07-06 DIAGNOSIS — E1151 Type 2 diabetes mellitus with diabetic peripheral angiopathy without gangrene: Secondary | ICD-10-CM | POA: Diagnosis not present

## 2017-07-06 DIAGNOSIS — L03119 Cellulitis of unspecified part of limb: Secondary | ICD-10-CM

## 2017-07-06 DIAGNOSIS — L600 Ingrowing nail: Secondary | ICD-10-CM

## 2017-07-06 MED ORDER — SULFAMETHOXAZOLE-TRIMETHOPRIM 400-80 MG PO TABS: 1.0000 | ORAL_TABLET | Freq: Two times a day (BID) | ORAL | 0 refills | Status: AC

## 2017-07-06 NOTE — Progress Notes (Signed)
BP 132/84   Pulse 96   Temp 98.1 F (36.7 C) (Oral)   Resp 16   Wt 221 lb (100.2 kg)   SpO2 97%   BMI 30.82 kg/m    Subjective:    Patient ID: Donald [name], male    DOB: 1966/11/05, 51 y.o.   MRN: [id number]  HPI: Donald [name] is a 51 y.o. male  Chief Complaint  Patient presents with  . Cellulitis    left ankle; Lot of the skin came off; was given antibotic at the hosp    HPI Patient is new to me, seen acutely today for cellulitis of the left lower extremity  He was seen by colleague last week; diagnosed with cellulitis, referred to ortho but ortho says bones are fine, no reason to see him; they want him to see vascular He was taking off his sock and skin just peeled off; saw a line all the way around, taking his sock down at work; next break, it got wider and wider, got off work and came here; right away, not taking chances with his feet He found out he had diabetes when drinking a gallon of water, tired all the time, still thirsty, sleeping a lot; father had diabetes (passed away) He has insulin-dependent type 2 diabetes mellitus which is well-controlled Lab Results  Component Value Date   HGBA1C 6.6 05/27/2017    He went to the ER on 06/30/17 and was admitted; discharge summary reviewed  CBC done 11 days ago, WBC 15.6k with 11.8k neutrophils; trended down to 12.4k and then 8.4k 5 days ago ------------------------------------------------------ Left ankle xrays: CLINICAL DATA:  Left ankle is wrong with red skin we pain. Scabbed skin is noted.  EXAM: LEFT ANKLE COMPLETE - 3+ VIEW  COMPARISON:  None.  FINDINGS: No acute fracture. No dislocation. Diffuse soft tissue swelling about the ankle is present. No destructive bone lesion. No obvious abnormal periosteum reaction. Mild degenerative changes in the ankle joint and midfoot.  IMPRESSION: No acute bony pathology.  Soft tissue swelling about the ankle is noted.   Electronically Signed   By:  Jolaine Click M.D.   On: 06/30/2017 19:18 ----------------------------------------------------------- He also had a venous doppler of the left lower extremity on 06/25/17: IMPRESSION: 1. No evidence of DVT within the left lower extremity. 2. Large 6.3 cm popliteal fossa cyst 3. Mildly prominent left inguinal lymph node   Electronically Signed   By: Jasmine Pang M.D.   On: 06/25/2017 16:38 -------------------------------------------------------------- Notes from primary: Notes recorded by Cecelia Byars, RMA on 06/30/2017 at 4:52 PM EDT Patient has been notified of results at office visit today 06/30/2017 ------  Notes recorded by Ellyn Hack, MD on 06/29/2017 at 6:45 PM EDT Left lower extremity venous Doppler ultrasound shows no evidence of DVT, there is a large 6.3 cm popliteal fossa cyst and mildly prominent left inguinal lymph node, please schedule for an appointment to discuss the significance of these ultrasound findings and to place appropriate follow-ups ----------------------------------------------------------------  Pain in the left foot and leg just hanging the leg down off the bed, not even walking on it; pain will get worse when walking; infection seems to be getting better; it's healing but taking a long time, worse at the bend of the ankle, where it bends; physical job, works 12 hours days; has to wear steel toed shoes  Depression screen [facility] 2/9 07/06/2017 07/03/2017 06/30/2017 05/27/2017 03/27/2017  Decreased Interest 0 0 0 0 0  Down,  Depressed, Hopeless - 0 0 0 0  PHQ - 2 Score 0 0 0 0 0   Relevant past medical, surgical, family and social history reviewed Past Medical History:  Diagnosis Date  . Diabetes mellitus without complication (HCC)   . Hypertension    Past Surgical History:  Procedure Laterality Date  . KNEE SURGERY Right    Family History  Problem Relation Age of Onset  . Hypertension Father   . Diabetes Father   . Liver disease Father   . Cancer  Sister        tumors in the breast   . Diabetes Maternal Grandfather   . Cancer Sister        tumors in the breast   . Cancer Sister        tumor in the breast    Social History   Social History  . Marital status: Married    Spouse name: N/A  . Number of children: N/A  . Years of education: N/A   Occupational History  . Not on file.   Social History Main Topics  . Smoking status: Current Every Day Smoker    Packs/day: 1.00    Types: Cigarettes  . Smokeless tobacco: Never Used  . Alcohol use Yes  . Drug use: No  . Sexual activity: Yes    Partners: Female   Other Topics Concern  . Not on file   Social History Narrative  . No narrative on file    Interim medical history since last visit reviewed. Allergies and medications reviewed  Review of Systems Per HPI unless specifically indicated above     Objective:    BP 132/84   Pulse 96   Temp 98.1 F (36.7 C) (Oral)   Resp 16   Wt 221 lb (100.2 kg)   SpO2 97%   BMI 30.82 kg/m   Wt Readings from Last 3 Encounters:  07/06/17 221 lb (100.2 kg)  07/03/17 219 lb 6.4 oz (99.5 kg)  06/30/17 218 lb 11.2 oz (99.2 kg)    Physical Exam  Constitutional: He appears well-developed and well-nourished. No distress.  Eyes: No scleral icterus.  Cardiovascular: Normal rate and regular rhythm.   Diminished D pedis pulse LEFT foot; toes on the LEFT are cooler than the toes on the right foot  Pulmonary/Chest: Effort normal and breath sounds normal.  Musculoskeletal: He exhibits no edema.  Neurological: He is alert.  Skin: No pallor.     Denuded skin at several areas of breakdown, with active eschar and slight drainage along lateral LEFT malleolus; no proximal streaking  Psychiatric: He has a normal mood and affect. His mood appears not anxious. He does not exhibit a depressed mood.    Results for orders placed or performed during the hospital encounter of 06/30/17  Comprehensive metabolic panel  Result Value Ref Range    Sodium 142 135 - 145 mmol/L   Potassium 4.1 3.5 - 5.1 mmol/L   Chloride 104 101 - 111 mmol/L   CO2 29 22 - 32 mmol/L   Glucose, Bld 102 (H) 65 - 99 mg/dL   BUN 17 6 - 20 mg/dL   Creatinine, Ser 1.91 0.61 - 1.24 mg/dL   Calcium 9.7 8.9 - 47.8 mg/dL   Total Protein 8.3 (H) 6.5 - 8.1 g/dL   Albumin 4.0 3.5 - 5.0 g/dL   AST 14 (L) 15 - 41 U/L   ALT 14 (L) 17 - 63 U/L   Alkaline Phosphatase 72 38 - 126  U/L   Total Bilirubin 0.5 0.3 - 1.2 mg/dL   GFR calc non Af Amer >60 >60 mL/min   GFR calc Af Amer >60 >60 mL/min   Anion gap 9 5 - 15  CBC with Differential  Result Value Ref Range   WBC 12.4 (H) 3.8 - 10.6 K/uL   RBC 4.23 (L) 4.40 - 5.90 MIL/uL   Hemoglobin 13.7 13.0 - 18.0 g/dL   HCT 16.1 (L) 09.6 - 04.5 %   MCV 93.5 80.0 - 100.0 fL   MCH 32.3 26.0 - 34.0 pg   MCHC 34.6 32.0 - 36.0 g/dL   RDW 40.9 81.1 - 91.4 %   Platelets 325 150 - 440 K/uL   Neutrophils Relative % 64 %   Neutro Abs 8.0 (H) 1.4 - 6.5 K/uL   Lymphocytes Relative 25 %   Lymphs Abs 3.1 1.0 - 3.6 K/uL   Monocytes Relative 8 %   Monocytes Absolute 0.9 0.2 - 1.0 K/uL   Eosinophils Relative 2 %   Eosinophils Absolute 0.2 0 - 0.7 K/uL   Basophils Relative 1 %   Basophils Absolute 0.1 0 - 0.1 K/uL  CBC  Result Value Ref Range   WBC 8.4 3.8 - 10.6 K/uL   RBC 3.91 (L) 4.40 - 5.90 MIL/uL   Hemoglobin 12.6 (L) 13.0 - 18.0 g/dL   HCT 78.2 (L) 95.6 - 21.3 %   MCV 93.1 80.0 - 100.0 fL   MCH 32.3 26.0 - 34.0 pg   MCHC 34.7 32.0 - 36.0 g/dL   RDW 08.6 57.8 - 46.9 %   Platelets 287 150 - 440 K/uL  Basic metabolic panel  Result Value Ref Range   Sodium 139 135 - 145 mmol/L   Potassium 3.4 (L) 3.5 - 5.1 mmol/L   Chloride 104 101 - 111 mmol/L   CO2 28 22 - 32 mmol/L   Glucose, Bld 122 (H) 65 - 99 mg/dL   BUN 18 6 - 20 mg/dL   Creatinine, Ser 6.29 0.61 - 1.24 mg/dL   Calcium 9.1 8.9 - 52.8 mg/dL   GFR calc non Af Amer >60 >60 mL/min   GFR calc Af Amer >60 >60 mL/min   Anion gap 7 5 - 15  Glucose, capillary    Result Value Ref Range   Glucose-Capillary 122 (H) 65 - 99 mg/dL   Comment 1 Notify RN   Glucose, capillary  Result Value Ref Range   Glucose-Capillary 86 65 - 99 mg/dL  Glucose, capillary  Result Value Ref Range   Glucose-Capillary 94 65 - 99 mg/dL  Vancomycin, trough  Result Value Ref Range   Vancomycin Tr 9 (L) 15 - 20 ug/mL  Glucose, capillary  Result Value Ref Range   Glucose-Capillary 74 65 - 99 mg/dL  Basic metabolic panel  Result Value Ref Range   Sodium 140 135 - 145 mmol/L   Potassium 4.3 3.5 - 5.1 mmol/L   Chloride 104 101 - 111 mmol/L   CO2 29 22 - 32 mmol/L   Glucose, Bld 137 (H) 65 - 99 mg/dL   BUN 16 6 - 20 mg/dL   Creatinine, Ser 4.13 0.61 - 1.24 mg/dL   Calcium 9.3 8.9 - 24.4 mg/dL   GFR calc non Af Amer >60 >60 mL/min   GFR calc Af Amer >60 >60 mL/min   Anion gap 7 5 - 15  Glucose, capillary  Result Value Ref Range   Glucose-Capillary 119 (H) 65 - 99 mg/dL  Glucose, capillary  Result Value Ref Range   Glucose-Capillary 129 (H) 65 - 99 mg/dL  Glucose, capillary  Result Value Ref Range   Glucose-Capillary 100 (H) 65 - 99 mg/dL      Assessment & Plan:   Problem List Items Addressed This Visit      Cardiovascular and Mediastinum   Peripheral vascular insufficiency (HCC)    Refer to vascular doctor for evaluation and treatment      Relevant Orders   Ambulatory referral to Vascular Surgery     Endocrine   DM (diabetes mellitus), type 2 with peripheral vascular complications (HCC)    Complicating things, refer to specialist      Relevant Orders   Ambulatory referral to Vascular Surgery   Ambulatory referral to Podiatry     Musculoskeletal and Integument   Ingrown toenail    Refer to podiatrist, special attention to great toenails as they are at risk for infection too and causing pain      Relevant Orders   Ambulatory referral to Podiatry     Other   Cellulitis of lower leg    Continue bactrim; I will write patient out of work to allow  for healing, attention to wound care; avoidance of steel toed boots, get to podiatrist and vascular doctor      Relevant Orders   Ambulatory referral to Vascular Surgery       Follow up plan: Return in about 3 weeks (around 07/24/2017) for follow-up with Dr. Sherryll BurgerShah.  An after-visit summary was printed and given to the patient at check-out.  Please see the patient instructions which may contain other information and recommendations beyond what is mentioned above in the assessment and plan.  Meds ordered this encounter  Medications  . sulfamethoxazole-trimethoprim (BACTRIM) 400-80 MG tablet    Sig: Take 1 tablet by mouth 2 (two) times daily.    Dispense:  10 tablet    Refill:  0    Orders Placed This Encounter  Procedures  . Ambulatory referral to Vascular Surgery  . Ambulatory referral to Podiatry

## 2017-07-06 NOTE — Assessment & Plan Note (Signed)
Complicating things, refer to specialist

## 2017-07-06 NOTE — Assessment & Plan Note (Signed)
Refer to vascular doctor for evaluation and treatment

## 2017-07-06 NOTE — Patient Instructions (Addendum)
We will keep you out of work until July 27, 2017 Let's see you back on July 24, 2017 to make sure you are ready to return that day Please do eat yogurt daily or take a probiotic daily for the next month We want to replace the healthy germs in the gut If you notice foul, watery diarrhea in the next two months, schedule an appointment RIGHT AWAY I've put in referrals for you If you have not heard anything from my staff by Wednesday afternoon about any orders/referrals/studies from today, please contact us here to follow-up [phone]

## 2017-07-06 NOTE — Assessment & Plan Note (Signed)
Continue bactrim; I will write patient out of work to allow for healing, attention to wound care; avoidance of steel toed boots, get to podiatrist and vascular doctor

## 2017-07-06 NOTE — Assessment & Plan Note (Signed)
Refer to podiatrist, special attention to great toenails as they are at risk for infection too and causing pain

## 2017-07-07 ENCOUNTER — Ambulatory Visit: Payer: BLUE CROSS/BLUE SHIELD | Admitting: Family Medicine

## 2017-07-11 ENCOUNTER — Other Ambulatory Visit: Payer: Self-pay | Admitting: Family Medicine

## 2017-07-14 ENCOUNTER — Ambulatory Visit (INDEPENDENT_AMBULATORY_CARE_PROVIDER_SITE_OTHER): Payer: BLUE CROSS/BLUE SHIELD | Admitting: Family Medicine

## 2017-07-14 ENCOUNTER — Encounter: Payer: Self-pay | Admitting: Family Medicine

## 2017-07-14 VITALS — BP 136/74 | HR 101 | Temp 98.8°F | Resp 17 | Ht 71.0 in | Wt 223.7 lb

## 2017-07-14 DIAGNOSIS — L03119 Cellulitis of unspecified part of limb: Secondary | ICD-10-CM

## 2017-07-14 DIAGNOSIS — R590 Localized enlarged lymph nodes: Secondary | ICD-10-CM | POA: Diagnosis not present

## 2017-07-14 DIAGNOSIS — M7122 Synovial cyst of popliteal space [Baker], left knee: Secondary | ICD-10-CM

## 2017-07-14 NOTE — Progress Notes (Signed)
Name: Donald [name]   MRN: [id number]    DOB: 1966/04/22   Date:07/14/2017       Progress Note  Subjective  Chief Complaint  Chief Complaint  Patient presents with  . Follow-up    HPI  Pt. Presents for follow up of inguinal lymph node enlargement, seen on venous doppler 2 weeks ago, it showed a mildly prominent lymph node in the left inguinal region. At that time, he was also suffering from dermatitis/cellulitis of left ankle for which he has been successfully treated with oral and IV antibiotics. The enlarged lymph node was thought to be due to the infection in the left ankle.  He reports no fevers, chills, weight loss, etc.    Past Medical History:  Diagnosis Date  . Diabetes mellitus without complication ([place])   . Hypertension     Past Surgical History:  Procedure Laterality Date  . KNEE SURGERY Right     Family History  Problem Relation Age of Onset  . Hypertension Father   . Diabetes Father   . Liver disease Father   . Cancer Sister        tumors in the breast   . Diabetes Maternal Grandfather   . Cancer Sister        tumors in the breast   . Cancer Sister        tumor in the breast     Social History   Social History  . Marital status: Married    Spouse name: N/A  . Number of children: N/A  . Years of education: N/A   Occupational History  . Not on file.   Social History Main Topics  . Smoking status: Current Every Day Smoker    Packs/day: 1.00    Types: Cigarettes  . Smokeless tobacco: Never Used  . Alcohol use Yes  . Drug use: No  . Sexual activity: Yes    Partners: Female   Other Topics Concern  . Not on file   Social History Narrative  . No narrative on file     Current Outpatient Prescriptions:  .  aspirin EC 81 MG EC tablet, Take 1 tablet (81 mg total) by mouth daily., Disp: , Rfl:  .  Blood Glucose Monitoring Suppl (D-CARE GLUCOMETER) w/Device KIT, 1 Units by Does not apply route 4 (four) times daily -  before meals and at  bedtime., Disp: 1 kit, Rfl: 0 .  Insulin Detemir (LEVEMIR FLEXPEN) 100 UNIT/ML Pen, Inject 15 Units into the skin daily at 10 pm., Disp: 15 mL, Rfl: 1 .  lisinopril (PRINIVIL,ZESTRIL) 2.5 MG tablet, Take 1 tablet (2.5 mg total) by mouth daily., Disp: 90 tablet, Rfl: 0 .  meloxicam (MOBIC) 7.5 MG tablet, TAKE 1 TABLET BY MOUTH EVERY DAY, Disp: 30 tablet, Rfl: 1 .  metFORMIN (GLUCOPHAGE) 500 MG tablet, TAKE 1 TABLET (500 MG TOTAL) BY MOUTH 2 (TWO) TIMES DAILY WITH A MEAL., Disp: 180 tablet, Rfl: 0 .  simvastatin (ZOCOR) 10 MG tablet, TAKE 1 TABLET (10 MG TOTAL) BY MOUTH DAILY., Disp: 90 tablet, Rfl: 0 .  furosemide (LASIX) 20 MG tablet, Take 1 tablet (20 mg total) by mouth every other day., Disp: 30 tablet, Rfl: 0 .  Vitamin D, Ergocalciferol, (DRISDOL) 50000 units CAPS capsule, Take 1 capsule (50,000 Units total) by mouth once a week. For 12 weeks (Patient not taking: Reported on 07/14/2017), Disp: 12 capsule, Rfl: 0  No Known Allergies   ROS  Please see history of present illness for  complete discussion of ROS  Objective  Vitals:   07/14/17 1336  BP: 136/74  Pulse: (!) 101  Resp: 17  Temp: 98.8 F (37.1 C)  TempSrc: Oral  SpO2: 96%  Weight: 223 lb 11.2 oz (101.5 kg)  Height: 5' 11"  (1.803 m)    Physical Exam  Constitutional: He is oriented to person, place, and time and well-developed, well-nourished, and in no distress.  Lymphadenopathy:       Left: Inguinal adenopathy present.  Left inguinal lymph node mildly enlarged and palpable especially in compariosn to contralateral side, no tenderness on palpation.   Neurological: He is alert and oriented to person, place, and time.  Skin:     Psychiatric: Mood, memory, affect and judgment normal.  Nursing note and vitals reviewed.     Assessment & Plan  1. Inguinal lymphadenopathy Likely because of ipsilateral lower extremity cellulitis, lymphadenopathy thought to be reactive, however obtain dedicated pelvic ultrasound for  evaluation - US Pelvis Complete; Future  2. Cellulitis of lower leg Resolving, finished 2 courses of antibiotics, reassured  3. Baker's cyst of knee, left Patient was referred to orthopedics, however it appears that he has not been seen yet.  Aleera Gilcrease [facility] 07/14/2017 1:41 PM

## 2017-07-16 ENCOUNTER — Telehealth: Payer: Self-pay | Admitting: Family Medicine

## 2017-07-16 DIAGNOSIS — M170 Bilateral primary osteoarthritis of knee: Secondary | ICD-10-CM | POA: Diagnosis not present

## 2017-07-16 NOTE — Telephone Encounter (Signed)
Spoke with Leotis ShamesLauren from [facility] location for outpatient imaging, she recommended ordering a left lower extremity nonvascular ultrasound for evaluation of lymphadenopathy, she will send a new order for my signature.

## 2017-07-16 NOTE — Telephone Encounter (Signed)
Lauren from [facility] requesting return call. Would like to verify an order before pt comes in tomorrow  [phone]

## 2017-07-16 NOTE — Telephone Encounter (Signed)
Dr. Sherryll BurgerShah, Leotis ShamesLauren from [facility] requesting return call. Would like to verify an order before pt comes in tomorrow  [phone]

## 2017-07-17 ENCOUNTER — Ambulatory Visit
Admission: RE | Admit: 2017-07-17 | Discharge: 2017-07-17 | Disposition: A | Discharge status: Home / Self Care | Payer: BLUE CROSS/BLUE SHIELD | Source: Ambulatory Visit | Attending: Family Medicine | Admitting: Family Medicine

## 2017-07-17 DIAGNOSIS — R590 Localized enlarged lymph nodes: Secondary | ICD-10-CM | POA: Insufficient documentation

## 2017-07-24 ENCOUNTER — Ambulatory Visit (INDEPENDENT_AMBULATORY_CARE_PROVIDER_SITE_OTHER): Payer: BLUE CROSS/BLUE SHIELD | Admitting: Family Medicine

## 2017-07-24 VITALS — BP 132/60 | HR 96 | Temp 98.2°F | Resp 16 | Ht 71.0 in | Wt 223.9 lb

## 2017-07-24 DIAGNOSIS — L249 Irritant contact dermatitis, unspecified cause: Secondary | ICD-10-CM | POA: Diagnosis not present

## 2017-07-24 DIAGNOSIS — R590 Localized enlarged lymph nodes: Secondary | ICD-10-CM | POA: Diagnosis not present

## 2017-07-24 NOTE — Progress Notes (Signed)
Name: Donald [name]   MRN: [id number]    DOB: Nov 30, 1966   Date:07/24/2017       Progress Note  Subjective  Chief Complaint  Chief Complaint  Patient presents with  . Follow-up    dermatitis/cellulitis of left ankle; Pt is experincing scabing on the left knee and in his groins. Pt also states they found growth in [place] at his groains     HPI  Pt. Presents for follow up of left groin lymphadenopathy, had an ultrasound of left groin performed which showed multiple inguinal lymph nodes and the largest one measured 2.7 x 0.7 x 2.0 cm, he has been referred to Hematology for further management. He has also noticed blister and subsequent scab formation on his left knee and scrotal area, it starts as blister formation and then turns into a scab with routine healing.   Past Medical History:  Diagnosis Date  . Diabetes mellitus without complication ([place])   . Hypertension     Past Surgical History:  Procedure Laterality Date  . KNEE SURGERY Right     Family History  Problem Relation Age of Onset  . Hypertension Father   . Diabetes Father   . Liver disease Father   . Cancer Sister        tumors in the breast   . Diabetes Maternal Grandfather   . Cancer Sister        tumors in the breast   . Cancer Sister        tumor in the breast     Social History   Social History  . Marital status: Married    Spouse name: N/A  . Number of children: N/A  . Years of education: N/A   Occupational History  . Not on file.   Social History Main Topics  . Smoking status: Current Every Day Smoker    Packs/day: 1.00    Types: Cigarettes  . Smokeless tobacco: Never Used  . Alcohol use Yes  . Drug use: No  . Sexual activity: Yes    Partners: Female   Other Topics Concern  . Not on file   Social History Narrative  . No narrative on file     Current Outpatient Prescriptions:  .  aspirin EC 81 MG EC tablet, Take 1 tablet (81 mg total) by mouth daily., Disp: , Rfl:  .  Blood Glucose  Monitoring Suppl (D-CARE GLUCOMETER) w/Device KIT, 1 Units by Does not apply route 4 (four) times daily -  before meals and at bedtime., Disp: 1 kit, Rfl: 0 .  Insulin Detemir (LEVEMIR FLEXPEN) 100 UNIT/ML Pen, Inject 15 Units into the skin daily at 10 pm., Disp: 15 mL, Rfl: 1 .  lisinopril (PRINIVIL,ZESTRIL) 2.5 MG tablet, Take 1 tablet (2.5 mg total) by mouth daily., Disp: 90 tablet, Rfl: 0 .  meloxicam (MOBIC) 7.5 MG tablet, TAKE 1 TABLET BY MOUTH EVERY DAY, Disp: 30 tablet, Rfl: 1 .  metFORMIN (GLUCOPHAGE) 500 MG tablet, TAKE 1 TABLET (500 MG TOTAL) BY MOUTH 2 (TWO) TIMES DAILY WITH A MEAL., Disp: 180 tablet, Rfl: 0 .  simvastatin (ZOCOR) 10 MG tablet, TAKE 1 TABLET (10 MG TOTAL) BY MOUTH DAILY., Disp: 90 tablet, Rfl: 0 .  furosemide (LASIX) 20 MG tablet, Take 1 tablet (20 mg total) by mouth every other day. (Patient not taking: Reported on 07/24/2017), Disp: 30 tablet, Rfl: 0 .  Vitamin D, Ergocalciferol, (DRISDOL) 50000 units CAPS capsule, Take 1 capsule (50,000 Units total) by mouth once a  week. For 12 weeks (Patient not taking: Reported on 07/24/2017), Disp: 12 capsule, Rfl: 0  No Known Allergies   ROS  Please see history of present illness for complete description of ROS  Objective  Vitals:   07/24/17 1051 07/24/17 1052  BP: 132/60   Pulse: (!) 106 96  Resp: 16   Temp: 98.2 F (36.8 C)   TempSrc: Oral   SpO2: 96%   Weight: 223 lb 14.4 oz (101.6 kg)   Height: 5' 11" (1.803 m)     Physical Exam  Constitutional: He is oriented to person, place, and time and well-developed, well-nourished, and in no distress.  HENT:  Head: Normocephalic and atraumatic.  Cardiovascular: Normal rate, regular rhythm and normal heart sounds.   No murmur heard. Pulmonary/Chest: Effort normal and breath sounds normal. He has no wheezes.  Musculoskeletal:       Legs: Mild tenderness to palpation in the left groin, likely corresponds to palpable lymph nodes  Neurological: He is alert and oriented  to person, place, and time.  Skin: Lesion noted. No erythema.     Healing lesion with scab formation on the left knee. A healing lesion with scab formation on scrotal skin.  No drainage tenderness, surrounding erythema or other signs of infection around either lesions.    Nursing note and vitals reviewed.     Assessment & Plan  1. Inguinal lymphadenopathy We'll refer to hematology for management, and lymphadenopathy likely reactive but other etiologies need to be ruled out - Ambulatory referral to Hematology  2. Irritant contact dermatitis, unspecified trigger Based on presentation of lesions, he likely has irritant contact dermatitis, lesions aren't healing, no evidence of infection. Advised to monitor and follow-up. He may apply Bactroban ointment to prevent infection if new lesions develop. If symptoms persist, he may need to be referred to dermatology.    Asad A.  [facility] 07/24/2017 10:55 AM 

## 2017-07-27 ENCOUNTER — Ambulatory Visit (INDEPENDENT_AMBULATORY_CARE_PROVIDER_SITE_OTHER): Payer: BLUE CROSS/BLUE SHIELD | Admitting: Podiatry

## 2017-07-27 ENCOUNTER — Ambulatory Visit: Payer: BLUE CROSS/BLUE SHIELD | Admitting: Family Medicine

## 2017-07-27 ENCOUNTER — Encounter: Payer: Self-pay | Admitting: Podiatry

## 2017-07-27 DIAGNOSIS — E1159 Type 2 diabetes mellitus with other circulatory complications: Secondary | ICD-10-CM | POA: Diagnosis not present

## 2017-07-27 DIAGNOSIS — L309 Dermatitis, unspecified: Secondary | ICD-10-CM | POA: Diagnosis not present

## 2017-07-27 DIAGNOSIS — Z8739 Personal history of other diseases of the musculoskeletal system and connective tissue: Secondary | ICD-10-CM | POA: Diagnosis not present

## 2017-07-27 DIAGNOSIS — B351 Tinea unguium: Secondary | ICD-10-CM | POA: Diagnosis not present

## 2017-07-27 DIAGNOSIS — M79674 Pain in right toe(s): Secondary | ICD-10-CM

## 2017-07-27 DIAGNOSIS — M79675 Pain in left toe(s): Secondary | ICD-10-CM

## 2017-07-27 MED ORDER — FLUOCINONIDE-E 0.05 % EX CREA: 1.0000 "application " | TOPICAL_CREAM | Freq: Two times a day (BID) | CUTANEOUS | 0 refills | Status: DC

## 2017-07-27 NOTE — Progress Notes (Signed)
   Subjective:    Patient ID: Donald [name], male    DOB: 1966/07/10, 51 y.o.   MRN: [id number]  HPI this patient presents the office with chief complaint of occasional pain along the borders of the big toe of the right foot.  He says they're occasionally painful walking and wearing his shoes.  This patient has been diagnosed as diabetic with vascular complications.  This patient also relates that he has a pinkish crusty skin lesion on the outside of his left ankle.  He says this area has not healed over the last 3 weeks.  He also relates having peeling skin and discoloration over his left ankle at the visit today.  He does give a history of having been admitted to the hospital approximately 3 weeks ago for bacterial infection to his left foot and ankle.  He was admitted to the hospital and treated with Zosyn and vancomycin.  Patient presents today and cannot verbalize the reason for his admitting to the hospital.  He presents to my office today for an evaluation of his ingrowing thick nails and his skin lesion.  He does give a history of prior gout episodes.  The patient still needs to be followed up and seen by his medical doctor.    Review of Systems     Objective:   Physical Exam GENERAL APPEARANCE: Alert, conversant. Appropriately groomed. No acute distress.  VASCULAR: Pedal pulses are  palpable at  [facility] and PT bilateral.  Capillary refill time is immediate to all digits,  Normal temperature gradient.   NEUROLOGIC: sensation is normal to 5.07 monofilament at 5/5 sites bilateral.  Light touch is intact bilateral, Muscle strength normal.  MUSCULOSKELETAL: acceptable muscle strength, tone and stability bilateral.  Intrinsic muscluature intact bilateral.  HAV  B/L NAILS  Thick disfigured discolored hallux toenails with incurvation noted in the nail grooves.  No evidence of any bacterial infection or drainage noted. DERMATOLOGIC: skin color, texture, and turgor are within normal limits.  No  preulcerative lesions or ulcers  are seen, no interdigital maceration noted.  No open lesions present.  . No drainage noted. Patient does have a skin lesion noted over the lateral malleolus of the left foot.  There is crusty skin noted overlying his red, inflamed skin.  He also has discoloration noted on the anterior aspect of the left ankle, which appears to be vitiligo like.  No evidence of any redness, swelling, pus or drainage noted        Assessment & Plan:  Onychomycosi 2.  Diabetes2 with peripheral vascular complications.  Dermatitis, left ankle   IE  Debridement of hallux nails  B/L.  patient was prescribed Lidex to apply to his skin lesion left ankle. Discussed his infection in his left ankle, which has resolved. At the visit today.  I told him he needs to be seen by his medical doctor who initially diagnosed him with an infection for a note for him to return to work.   Helane Gunther DPM

## 2017-07-30 ENCOUNTER — Ambulatory Visit: Payer: BLUE CROSS/BLUE SHIELD | Admitting: Oncology

## 2017-07-31 ENCOUNTER — Encounter: Payer: Self-pay | Admitting: Oncology

## 2017-07-31 ENCOUNTER — Inpatient Hospital Stay: Payer: BLUE CROSS/BLUE SHIELD

## 2017-07-31 ENCOUNTER — Inpatient Hospital Stay: Payer: BLUE CROSS/BLUE SHIELD | Attending: Oncology | Admitting: Oncology

## 2017-07-31 VITALS — BP 151/86 | HR 79 | Temp 96.6°F | Resp 18 | Ht 72.05 in | Wt 231.6 lb

## 2017-07-31 DIAGNOSIS — Z79899 Other long term (current) drug therapy: Secondary | ICD-10-CM | POA: Insufficient documentation

## 2017-07-31 DIAGNOSIS — Z794 Long term (current) use of insulin: Secondary | ICD-10-CM | POA: Diagnosis not present

## 2017-07-31 DIAGNOSIS — I10 Essential (primary) hypertension: Secondary | ICD-10-CM | POA: Insufficient documentation

## 2017-07-31 DIAGNOSIS — Z7982 Long term (current) use of aspirin: Secondary | ICD-10-CM | POA: Diagnosis not present

## 2017-07-31 DIAGNOSIS — R590 Localized enlarged lymph nodes: Secondary | ICD-10-CM | POA: Diagnosis not present

## 2017-07-31 DIAGNOSIS — E785 Hyperlipidemia, unspecified: Secondary | ICD-10-CM | POA: Diagnosis not present

## 2017-07-31 DIAGNOSIS — F17210 Nicotine dependence, cigarettes, uncomplicated: Secondary | ICD-10-CM | POA: Diagnosis not present

## 2017-07-31 DIAGNOSIS — E1151 Type 2 diabetes mellitus with diabetic peripheral angiopathy without gangrene: Secondary | ICD-10-CM | POA: Insufficient documentation

## 2017-07-31 LAB — CBC WITH DIFFERENTIAL/PLATELET
Basophils Absolute: 0.1 10*3/uL (ref 0–0.1)
Basophils Relative: 1 %
EOS ABS: 0.3 10*3/uL (ref 0–0.7)
EOS PCT: 3 %
HCT: 36.7 % — ABNORMAL LOW (ref 40.0–52.0)
Hemoglobin: 12.5 g/dL — ABNORMAL LOW (ref 13.0–18.0)
LYMPHS ABS: 3.4 10*3/uL (ref 1.0–3.6)
LYMPHS PCT: 40 %
MCH: 32.2 pg (ref 26.0–34.0)
MCHC: 34.1 g/dL (ref 32.0–36.0)
MCV: 94.4 fL (ref 80.0–100.0)
MONOS PCT: 8 %
Monocytes Absolute: 0.7 10*3/uL (ref 0.2–1.0)
Neutro Abs: 4 10*3/uL (ref 1.4–6.5)
Neutrophils Relative %: 48 %
Platelets: 264 10*3/uL (ref 150–440)
RBC: 3.89 MIL/uL — AB (ref 4.40–5.90)
RDW: 13.4 % (ref 11.5–14.5)
WBC: 8.5 10*3/uL (ref 3.8–10.6)

## 2017-07-31 LAB — COMPREHENSIVE METABOLIC PANEL
ALBUMIN: 3.6 g/dL (ref 3.5–5.0)
ALT: 19 U/L (ref 17–63)
AST: 17 U/L (ref 15–41)
Alkaline Phosphatase: 61 U/L (ref 38–126)
Anion gap: 7 (ref 5–15)
BUN: 14 mg/dL (ref 6–20)
CO2: 28 mmol/L (ref 22–32)
Calcium: 9.3 mg/dL (ref 8.9–10.3)
Chloride: 105 mmol/L (ref 101–111)
Creatinine, Ser: 0.88 mg/dL (ref 0.61–1.24)
GFR calc Af Amer: >60 mL/min (ref >60)
GFR calc non Af Amer: >60 mL/min (ref >60)
GLUCOSE: 130 mg/dL — AB (ref 65–99)
POTASSIUM: 3.9 mmol/L (ref 3.5–5.1)
SODIUM: 140 mmol/L (ref 135–145)
Total Bilirubin: 0.4 mg/dL (ref 0.3–1.2)
Total Protein: 7.4 g/dL (ref 6.5–8.1)

## 2017-07-31 LAB — LACTATE DEHYDROGENASE: LDH: 132 U/L (ref 98–192)

## 2017-07-31 NOTE — Progress Notes (Signed)
Hematology/Oncology Consult note [facility] Telephone:([phone] [phone]  Patient Care Team: Roselee Nova, MD as PCP - General (Family Medicine)   Name of the patient: Donald [name]  [id number]  15-Jun-1966    Reason for referral- inguinal adenopathy   Referring physician- Dr. Manuella Ghazi  Date of visit: 07/31/17   History of presenting illness- 1. Patient is a 51 year old male who initially presented with symptoms of left ankle maintenance and underwent an ultrasound of the left leg which did not show any evidence of leukemia. 0.3 cm popliteal fossa cyst was noted and a mildly prominent left inguinal lymph node noted.  2. He underwent ultrasound of left lower extremity which showed:IMPRESSION: Left inguinal adenopathy with the largest measuring 2.7 x 0.7 x 2.0 cm. No cystic fluid collections noted. No hernia.  3. He has been referred to [place] for the same. Reports doing well overall. He did have LLE pain swelling and possible cellulitis 4 weeks back and was treated with po antibiotics for 2 weeks. Reports pain and swelling has improved significantly. Reports night sweats on and off for 3 years now. Reports 10 pound weight loss over 6 months but on todays exam he has gained weight  ECOG PS- 0  Pain scale- 5   Review of systems- Review of Systems  Constitutional: Negative for chills, fever, malaise/fatigue and weight loss.  HENT: Negative for congestion, ear discharge and nosebleeds.   Eyes: Negative for blurred vision.  Respiratory: Negative for cough, hemoptysis, sputum production, shortness of breath and wheezing.   Cardiovascular: Negative for chest pain, palpitations, orthopnea and claudication.  Gastrointestinal: Negative for abdominal pain, blood in stool, constipation, diarrhea, heartburn, melena, nausea and vomiting.  Genitourinary: Negative for dysuria, flank pain, frequency, hematuria and urgency.  Musculoskeletal: Negative for back  pain, joint pain and myalgias.  Skin: Negative for rash.  Neurological: Negative for dizziness, tingling, focal weakness, seizures, weakness and headaches.  Endo/Heme/Allergies: Does not bruise/bleed easily.  Psychiatric/Behavioral: Negative for depression and suicidal ideas. The patient does not have insomnia.     Allergies  Allergen Reactions  . No Known Allergies     Patient Active Problem List   Diagnosis Date Noted  . Cellulitis of lower leg 07/06/2017  . Ingrown toenail 07/06/2017  . Peripheral vascular insufficiency ([place]) 07/06/2017  . Left foot infection 06/30/2017  . Elevated BP without diagnosis of hypertension 05/27/2017  . Screening for prostate cancer 03/27/2017  . Need for hepatitis C screening test 03/27/2017  . Hyperlipidemia 10/31/2016  . DM (diabetes mellitus), type 2 with peripheral vascular complications ([place]) 50/35/4656  . Left groin pain 04/10/2016     Past Medical History:  Diagnosis Date  . Diabetes mellitus without complication ([place])   . Hypertension      Past Surgical History:  Procedure Laterality Date  . KNEE SURGERY Right     Social History   Social History  . Marital status: Married    Spouse name: N/A  . Number of children: N/A  . Years of education: N/A   Occupational History  . Not on file.   Social History Main Topics  . Smoking status: Current Every Day Smoker    Packs/day: 1.00    Types: Cigarettes  . Smokeless tobacco: Never Used  . Alcohol use Yes  . Drug use: No  . Sexual activity: Yes    Partners: Female   Other Topics Concern  . Not on file   Social History Narrative  . No narrative  on file     Family History  Problem Relation Age of Onset  . Hypertension Father   . Diabetes Father   . Liver disease Father   . Cancer Sister        tumors in the breast   . Diabetes Maternal Grandfather   . Cancer Sister        tumors in the breast   . Cancer Sister        tumor in the breast      Current  Outpatient Prescriptions:  .  aspirin EC 81 MG EC tablet, Take 1 tablet (81 mg total) by mouth daily., Disp: , Rfl:  .  Blood Glucose Monitoring Suppl (D-CARE GLUCOMETER) w/Device KIT, 1 Units by Does not apply route 4 (four) times daily -  before meals and at bedtime., Disp: 1 kit, Rfl: 0 .  Insulin Detemir (LEVEMIR FLEXPEN) 100 UNIT/ML Pen, Inject 15 Units into the skin daily at 10 pm., Disp: 15 mL, Rfl: 1 .  lisinopril (PRINIVIL,ZESTRIL) 2.5 MG tablet, Take 1 tablet (2.5 mg total) by mouth daily., Disp: 90 tablet, Rfl: 0 .  meloxicam (MOBIC) 7.5 MG tablet, TAKE 1 TABLET BY MOUTH EVERY DAY, Disp: 30 tablet, Rfl: 1 .  metFORMIN (GLUCOPHAGE) 500 MG tablet, TAKE 1 TABLET (500 MG TOTAL) BY MOUTH 2 (TWO) TIMES DAILY WITH A MEAL., Disp: 180 tablet, Rfl: 0 .  simvastatin (ZOCOR) 10 MG tablet, TAKE 1 TABLET (10 MG TOTAL) BY MOUTH DAILY., Disp: 90 tablet, Rfl: 0 .  fluocinonide-emollient (LIDEX-E) 0.05 % cream, Apply 1 application topically 2 (two) times daily. (Patient not taking: Reported on 07/31/2017), Disp: 60 g, Rfl: 0 .  Vitamin D, Ergocalciferol, (DRISDOL) 50000 units CAPS capsule, Take 1 capsule (50,000 Units total) by mouth once a week. For 12 weeks (Patient not taking: Reported on 07/24/2017), Disp: 12 capsule, Rfl: 0   Physical exam:  Vitals:   07/31/17 1457  BP: (!) 151/86  Pulse: 79  Resp: 18  Temp: (!) 96.6 F (35.9 C)  TempSrc: Tympanic  Weight: 231 lb 9.6 oz (105.1 kg)  Height: 6' 0.05" (1.83 m)   Physical Exam  Constitutional: He is oriented to person, place, and time and well-developed, well-nourished, and in no distress.  HENT:  Head: Normocephalic and atraumatic.  Eyes: Pupils are equal, round, and reactive to light. EOM are normal.  Neck: Normal range of motion.  Cardiovascular: Normal rate, regular rhythm and normal heart sounds.   Pulmonary/Chest: Effort normal and breath sounds normal.  Abdominal: Soft. Bowel sounds are normal.  Lymphadenopathy:  No palpable  cervical, axillary adenopathy. 2-3 ~2 cm LN palpable in the left inguinal region. No right inguinal adenopathy  Neurological: He is alert and oriented to person, place, and time.  Skin: Skin is warm and dry.       CMP Latest Ref Rng & Units 07/02/2017  Glucose 65 - 99 mg/dL 137(H)  BUN 6 - 20 mg/dL 16  Creatinine 0.61 - 1.24 mg/dL 0.90  Sodium 135 - 145 mmol/L 140  Potassium 3.5 - 5.1 mmol/L 4.3  Chloride 101 - 111 mmol/L 104  CO2 22 - 32 mmol/L 29  Calcium 8.9 - 10.3 mg/dL 9.3  Total Protein 6.5 - 8.1 g/dL -  Total Bilirubin 0.3 - 1.2 mg/dL -  Alkaline Phos 38 - 126 U/L -  AST 15 - 41 U/L -  ALT 17 - 63 U/L -   CBC Latest Ref Rng & Units 07/01/2017  WBC 3.8 - 10.6  K/uL 8.4  Hemoglobin 13.0 - 18.0 g/dL 12.6(L)  Hematocrit 40.0 - 52.0 % 36.4(L)  Platelets 150 - 440 K/uL 287    No images are attached to the encounter.  [place] Chance Soft Tissue Non Vascular  Result Date: 07/17/2017 CLINICAL DATA:  Inguinal adenopathy. EXAM: ULTRASOUND LEFT LOWER EXTREMITY LIMITED TECHNIQUE: Ultrasound examination of the lower extremity soft tissues was performed in the area of clinical concern. COMPARISON:  06/30/2017 ankle series. 06/25/2017 lower extremity venous duplex. FINDINGS: Multiple prominent lymph nodes are noted the left groin. The largest measures 2.7 x 0.7 x 2.0 cm. This lymph node contains fat. The lymph node may be slightly hypervascular. No cystic fluid collections. No hiatal hernia. IMPRESSION: Left inguinal adenopathy with the largest measuring 2.7 x 0.7 x 2.0 cm. No cystic fluid collections noted. No hernia. Electronically Signed   By: Marcello Moores  Register   On: 07/17/2017 16:08    Assessment and plan- Patient is a 51 y.o. male referred for left inguinal adenopathy  Discussed that adenopathy could be reactive versus other etiologies like malignancy. He does not have palpable adenopathy elsewhere. Options would be waiting for 6 weeks and repeat US to see if it has resolved  versus proceeding to biopsy at this time. Patient did not have an episode of LLE cellulitis and completed his antibiotics 2 weeks ago. I would therefore waiting for 6 weeks and getting a repeat USG. If adenopathy persists, I will refer him to surgery for excisional biopsy. Check cbc, cmp and ldh today  I will see him back after the usg.   Thank you for this kind referral and the opportunity to participate in the care of this patient   Visit Diagnosis 1. Inguinal adenopathy     Dr. Randa Evens, MD, MPH [place] [facility] Pager- [phone] 07/31/2017

## 2017-07-31 NOTE — Progress Notes (Signed)
Here for new pt evaluation.  

## 2017-08-04 ENCOUNTER — Encounter: Payer: Self-pay | Admitting: Oncology

## 2017-08-04 ENCOUNTER — Telehealth: Payer: Self-pay | Admitting: Oncology

## 2017-08-04 NOTE — Telephone Encounter (Signed)
[facility] L/M on V/M. Mobile number not in service. Appt mailed. MF

## 2017-08-05 ENCOUNTER — Ambulatory Visit (INDEPENDENT_AMBULATORY_CARE_PROVIDER_SITE_OTHER): Payer: BLUE CROSS/BLUE SHIELD | Admitting: Family Medicine

## 2017-08-05 ENCOUNTER — Encounter: Payer: Self-pay | Admitting: Family Medicine

## 2017-08-05 ENCOUNTER — Telehealth: Payer: Self-pay | Admitting: Family Medicine

## 2017-08-05 ENCOUNTER — Other Ambulatory Visit
Admission: RE | Admit: 2017-08-05 | Discharge: 2017-08-05 | Disposition: A | Discharge status: Home / Self Care | Payer: BLUE CROSS/BLUE SHIELD | Source: Ambulatory Visit | Attending: Family Medicine | Admitting: Family Medicine

## 2017-08-05 VITALS — BP 122/78 | HR 100 | Temp 98.9°F | Resp 16 | Wt 227.8 lb

## 2017-08-05 DIAGNOSIS — E1151 Type 2 diabetes mellitus with diabetic peripheral angiopathy without gangrene: Secondary | ICD-10-CM | POA: Diagnosis not present

## 2017-08-05 DIAGNOSIS — R030 Elevated blood-pressure reading, without diagnosis of hypertension: Secondary | ICD-10-CM

## 2017-08-05 DIAGNOSIS — R55 Syncope and collapse: Secondary | ICD-10-CM

## 2017-08-05 DIAGNOSIS — Z8673 Personal history of transient ischemic attack (TIA), and cerebral infarction without residual deficits: Secondary | ICD-10-CM | POA: Insufficient documentation

## 2017-08-05 LAB — CBC WITH DIFFERENTIAL/PLATELET
Basophils Absolute: 0.1 10*3/uL (ref 0–0.1)
Basophils Relative: 1 %
EOS ABS: 0.2 10*3/uL (ref 0–0.7)
Eosinophils Relative: 3 %
HEMATOCRIT: 40.1 % (ref 40.0–52.0)
HEMOGLOBIN: 13.8 g/dL (ref 13.0–18.0)
LYMPHS ABS: 3.5 10*3/uL (ref 1.0–3.6)
LYMPHS PCT: 39 %
MCH: 32.5 pg (ref 26.0–34.0)
MCHC: 34.5 g/dL (ref 32.0–36.0)
MCV: 94.2 fL (ref 80.0–100.0)
Monocytes Absolute: 0.9 10*3/uL (ref 0.2–1.0)
Monocytes Relative: 10 %
NEUTROS ABS: 4.2 10*3/uL (ref 1.4–6.5)
NEUTROS PCT: 47 %
Platelets: 281 10*3/uL (ref 150–440)
RBC: 4.25 MIL/uL — AB (ref 4.40–5.90)
RDW: 13 % (ref 11.5–14.5)
WBC: 8.8 10*3/uL (ref 3.8–10.6)

## 2017-08-05 LAB — COMPREHENSIVE METABOLIC PANEL
ALBUMIN: 3.8 g/dL (ref 3.5–5.0)
ALT: 18 U/L (ref 17–63)
AST: 16 U/L (ref 15–41)
Alkaline Phosphatase: 69 U/L (ref 38–126)
Anion gap: 7 (ref 5–15)
BUN: 23 mg/dL — ABNORMAL HIGH (ref 6–20)
CO2: 29 mmol/L (ref 22–32)
CREATININE: 1.08 mg/dL (ref 0.61–1.24)
Calcium: 9.4 mg/dL (ref 8.9–10.3)
Chloride: 104 mmol/L (ref 101–111)
GFR calc Af Amer: >60 mL/min (ref >60)
GFR calc non Af Amer: >60 mL/min (ref >60)
Glucose, Bld: 108 mg/dL — ABNORMAL HIGH (ref 65–99)
POTASSIUM: 4.2 mmol/L (ref 3.5–5.1)
Sodium: 140 mmol/L (ref 135–145)
Total Bilirubin: 0.4 mg/dL (ref 0.3–1.2)
Total Protein: 7.8 g/dL (ref 6.5–8.1)

## 2017-08-05 NOTE — Progress Notes (Addendum)
Name: Donald [name]   MRN: [id number]    DOB: 05-27-1966   Date:08/05/2017       Progress Note  Subjective  Chief Complaint  Chief Complaint  Patient presents with  . Dizziness    blurred vision    HPI  Patient presents with complaint of near-syncopal episode yesterday 08/04/2017:  He tried to returned to work on 07/27/2017 - saw podiatry on 07/27/2017 as well and was given a cream for onychomycosis.  Was finally cleared by his employer to start back at work yesterday, and he had a near-syncopal episode - was told that he could not return until he was cleared for the lightheadedness.  Went to work yesterday and had near-syncopal episode about 3-4 hours in to his day.  Had about an hour of feeling "woozy" and lightheaded, then had about 5-10 minutes of feeling like he was going to pass out. Experienced blurred vision, lightheadedness, general feeling of weakness. 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  No slurred speech, facial droop, or one-sided extremity weakness, nausea, vomiting, or changes in BM's.  CBG yesterday after this event was 140. Does not check BP at home, but this is well-controlled today and does not appear to be too low.  Patient Active Problem List   Diagnosis Date Noted  . Cellulitis of lower leg 07/06/2017  . Ingrown toenail 07/06/2017  . Peripheral vascular insufficiency ([place]) 07/06/2017  . Left foot infection 06/30/2017  . Elevated BP without diagnosis of hypertension 05/27/2017  . Screening for prostate cancer 03/27/2017  . Need for hepatitis C screening test 03/27/2017  . Hyperlipidemia 10/31/2016  . DM (diabetes mellitus), type 2 with peripheral vascular complications ([place]) 40/98/1191  . Left groin pain 04/10/2016    Social History  Substance Use Topics  . Smoking status: Current Every Day Smoker    Packs/day: 1.00    Years: 30.00    Types: Cigarettes  . Smokeless tobacco: Never Used  . Alcohol use Yes     Comment: no etoh x 1 month  .previously social drinker     Current Outpatient Prescriptions:  .  aspirin EC 81 MG EC tablet, Take 1 tablet (81 mg total) by mouth daily., Disp: , Rfl:  .  fluocinonide-emollient (LIDEX-E) 0.05 % cream, Apply 1 application topically 2 (two) times daily., Disp: 60 g, Rfl: 0 .  Insulin Detemir (LEVEMIR FLEXPEN) 100 UNIT/ML Pen, Inject 15 Units into the skin daily at 10 pm., Disp: 15 mL, Rfl: 1 .  lisinopril (PRINIVIL,ZESTRIL) 2.5 MG tablet, Take 1 tablet (2.5 mg total) by mouth daily., Disp: 90 tablet, Rfl: 0 .  meloxicam (MOBIC) 7.5 MG tablet, TAKE 1 TABLET BY MOUTH EVERY DAY, Disp: 30 tablet, Rfl: 1 .  metFORMIN (GLUCOPHAGE) 500 MG tablet, TAKE 1 TABLET (500 MG TOTAL) BY MOUTH 2 (TWO) TIMES DAILY WITH A MEAL., Disp: 180 tablet, Rfl: 0 .  simvastatin (ZOCOR) 10 MG tablet, TAKE 1 TABLET (10 MG TOTAL) BY MOUTH DAILY., Disp: 90 tablet, Rfl: 0 .  Blood Glucose Monitoring Suppl (D-CARE GLUCOMETER) w/Device KIT, 1 Units by Does not apply route 4 (four) times daily -  before meals and at bedtime., Disp: 1 kit, Rfl: 0 .  Vitamin D, Ergocalciferol, (DRISDOL) 50000 units CAPS capsule, Take 1 capsule (50,000 Units total) by mouth once a week. For 12 weeks (Patient not taking: Reported on 07/24/2017), Disp: 12 capsule, Rfl: 0  Allergies  Allergen Reactions  . No Known Allergies  ROS  Constitutional: Negative for fever or weight change.  Respiratory: Negative for cough and shortness of breath.   Cardiovascular: Negative for chest pain or palpitations.  Gastrointestinal: Negative for abdominal pain, no bowel changes.  Musculoskeletal: Negative for gait problem or joint swelling.  Skin: Negative for rash.  Neurological: Negative for dizziness or headache.  No other specific complaints in a complete review of systems (except as listed in HPI above).  Objective  Vitals:   08/05/17 1413  BP: 122/78  Pulse: 100  Resp: 16  Temp: 98.9 F (37.2 C)  TempSrc: Oral  SpO2: 98%  Weight: 227 lb  12.8 oz (103.3 kg)   Body mass index is 30.85 kg/m.  Nursing Note and Vital Signs reviewed.  Physical Exam  Constitutional: Patient appears well-developed and well-nourished. Obese  No distress.  HEENT: head atraumatic, normocephalic, pupils equal and reactive to light, EOM's intact, neck supple without lymphadenopathy, oropharynx pink and moist without exudate Cardiovascular: Normal rate, regular rhythm, S1/S2 present.  No murmur or rub heard. No BLE edema. Pulmonary/Chest: Effort normal and breath sounds clear. No respiratory distress or retractions. Psychiatric: Patient has a normal mood and affect. behavior is normal. Judgment and thought content normal. Musculoskeletal: Normal range of motion, no joint effusions. No gross deformities Neurological: he is alert and oriented to person, place, and time. No cranial nerve deficit. Coordination, balance, strength, speech and gait are normal.  Skin: Skin is warm and dry. No rash noted. No erythema.  Psychiatric: Patient has a normal mood and affect. behavior is normal. Judgment and thought content normal.  Recent Results (from the past 2160 hour(s))  POCT HgB A1C     Status: Abnormal   Collection Time: 05/27/17  8:41 AM  Result Value Ref Range   Hemoglobin A1C 6.6   CBC with Differential     Status: Abnormal   Collection Time: 06/25/17  2:22 PM  Result Value Ref Range   WBC 15.6 (H) 3.8 - 10.8 K/uL   RBC 4.25 4.20 - 5.80 MIL/uL   Hemoglobin 13.7 13.2 - 17.1 g/dL   HCT 40.7 38.5 - 50.0 %   MCV 95.8 80.0 - 100.0 fL   MCH 32.2 27.0 - 33.0 pg   MCHC 33.7 32.0 - 36.0 g/dL   RDW 14.1 11.0 - 15.0 %   Platelets 329 140 - 400 K/uL   MPV 10.7 7.5 - 12.5 fL   Neutro Abs 11,856 (H) 1,500 - 7,800 cells/uL   Lymphs Abs 2,652 850 - 3,900 cells/uL   Monocytes Absolute 936 200 - 950 cells/uL   Eosinophils Absolute 156 15 - 500 cells/uL   Basophils Absolute 0 0 - 200 cells/uL   Neutrophils Relative % 76 %   Lymphocytes Relative 17 %    Monocytes Relative 6 %   Eosinophils Relative 1 %   Basophils Relative 0 %   Smear Review Criteria for review not met   COMPLETE METABOLIC PANEL WITH GFR     Status: None   Collection Time: 06/25/17  2:22 PM  Result Value Ref Range   Sodium 140 135 - 146 mmol/L   Potassium 4.3 3.5 - 5.3 mmol/L   Chloride 106 98 - 110 mmol/L   CO2 24 20 - 31 mmol/L   Glucose, Bld 86 65 - 99 mg/dL   BUN 16 7 - 25 mg/dL   Creat 0.92 0.70 - 1.33 mg/dL    Comment:   For patients > or = 53 years of age: The upper reference  limit for Creatinine is approximately 13% higher for people identified as African-American.      Total Bilirubin 0.3 0.2 - 1.2 mg/dL   Alkaline Phosphatase 69 40 - 115 U/L   AST 13 10 - 35 U/L   ALT 15 9 - 46 U/L   Total Protein 7.3 6.1 - 8.1 g/dL   Albumin 4.2 3.6 - 5.1 g/dL   Calcium 9.5 8.6 - 10.3 mg/dL   GFR, Est African American >89 >=60 mL/min   GFR, Est Non African American >89 >=60 mL/min  VITAMIN D 25 Hydroxy (Vit-D Deficiency, Fractures)     Status: None   Collection Time: 06/25/17  2:22 PM  Result Value Ref Range   Vit D, 25-Hydroxy 31 30 - 100 ng/mL    Comment: Vitamin D Status           25-OH Vitamin D        Deficiency                <20 ng/mL        Insufficiency         20 - 29 ng/mL        Optimal             > or = 30 ng/mL   For 25-OH Vitamin D testing on patients on D2-supplementation and patients for whom quantitation of D2 and D3 fractions is required, the QuestAssureD 25-OH VIT D, (D2,D3), LC/MS/MS is recommended: order code [phone] (patients > 2 yrs).   Comprehensive metabolic panel     Status: Abnormal   Collection Time: 06/30/17  6:03 PM  Result Value Ref Range   Sodium 142 135 - 145 mmol/L   Potassium 4.1 3.5 - 5.1 mmol/L   Chloride 104 101 - 111 mmol/L   CO2 29 22 - 32 mmol/L   Glucose, Bld 102 (H) 65 - 99 mg/dL   BUN 17 6 - 20 mg/dL   Creatinine, Ser 0.95 0.61 - 1.24 mg/dL   Calcium 9.7 8.9 - 10.3 mg/dL   Total Protein 8.3 (H) 6.5 - 8.1  g/dL   Albumin 4.0 3.5 - 5.0 g/dL   AST 14 (L) 15 - 41 U/L   ALT 14 (L) 17 - 63 U/L   Alkaline Phosphatase 72 38 - 126 U/L   Total Bilirubin 0.5 0.3 - 1.2 mg/dL   GFR calc non Af Amer >60 >60 mL/min   GFR calc Af Amer >60 >60 mL/min    Comment: (NOTE) The eGFR has been calculated using the CKD EPI equation. This calculation has not been validated in all clinical situations. eGFR's persistently <60 mL/min signify possible Chronic Kidney Disease.    Anion gap 9 5 - 15  CBC with Differential     Status: Abnormal   Collection Time: 06/30/17  6:03 PM  Result Value Ref Range   WBC 12.4 (H) 3.8 - 10.6 K/uL   RBC 4.23 (L) 4.40 - 5.90 MIL/uL   Hemoglobin 13.7 13.0 - 18.0 g/dL   HCT 39.6 (L) 40.0 - 52.0 %   MCV 93.5 80.0 - 100.0 fL   MCH 32.3 26.0 - 34.0 pg   MCHC 34.6 32.0 - 36.0 g/dL   RDW 13.6 11.5 - 14.5 %   Platelets 325 150 - 440 K/uL   Neutrophils Relative % 64 %   Neutro Abs 8.0 (H) 1.4 - 6.5 K/uL   Lymphocytes Relative 25 %   Lymphs Abs 3.1 1.0 - 3.6 K/uL   Monocytes  Relative 8 %   Monocytes Absolute 0.9 0.2 - 1.0 K/uL   Eosinophils Relative 2 %   Eosinophils Absolute 0.2 0 - 0.7 K/uL   Basophils Relative 1 %   Basophils Absolute 0.1 0 - 0.1 K/uL  Glucose, capillary     Status: Abnormal   Collection Time: 06/30/17  9:11 PM  Result Value Ref Range   Glucose-Capillary 122 (H) 65 - 99 mg/dL   Comment 1 Notify RN   CBC     Status: Abnormal   Collection Time: 07/01/17  3:29 AM  Result Value Ref Range   WBC 8.4 3.8 - 10.6 K/uL   RBC 3.91 (L) 4.40 - 5.90 MIL/uL   Hemoglobin 12.6 (L) 13.0 - 18.0 g/dL   HCT 36.4 (L) 40.0 - 52.0 %   MCV 93.1 80.0 - 100.0 fL   MCH 32.3 26.0 - 34.0 pg   MCHC 34.7 32.0 - 36.0 g/dL   RDW 13.8 11.5 - 14.5 %   Platelets 287 150 - 440 K/uL  Basic metabolic panel     Status: Abnormal   Collection Time: 07/01/17  3:29 AM  Result Value Ref Range   Sodium 139 135 - 145 mmol/L   Potassium 3.4 (L) 3.5 - 5.1 mmol/L   Chloride 104 101 - 111 mmol/L    CO2 28 22 - 32 mmol/L   Glucose, Bld 122 (H) 65 - 99 mg/dL   BUN 18 6 - 20 mg/dL   Creatinine, Ser 0.91 0.61 - 1.24 mg/dL   Calcium 9.1 8.9 - 10.3 mg/dL   GFR calc non Af Amer >60 >60 mL/min   GFR calc Af Amer >60 >60 mL/min    Comment: (NOTE) The eGFR has been calculated using the CKD EPI equation. This calculation has not been validated in all clinical situations. eGFR's persistently <60 mL/min signify possible Chronic Kidney Disease.    Anion gap 7 5 - 15  Glucose, capillary     Status: None   Collection Time: 07/01/17  7:38 AM  Result Value Ref Range   Glucose-Capillary 86 65 - 99 mg/dL  Glucose, capillary     Status: None   Collection Time: 07/01/17 11:44 AM  Result Value Ref Range   Glucose-Capillary 94 65 - 99 mg/dL  Glucose, capillary     Status: None   Collection Time: 07/01/17  4:31 PM  Result Value Ref Range   Glucose-Capillary 74 65 - 99 mg/dL  Glucose, capillary     Status: Abnormal   Collection Time: 07/01/17  9:11 PM  Result Value Ref Range   Glucose-Capillary 119 (H) 65 - 99 mg/dL  Vancomycin, trough     Status: Abnormal   Collection Time: 07/02/17  1:34 AM  Result Value Ref Range   Vancomycin Tr 9 (L) 15 - 20 ug/mL  Basic metabolic panel     Status: Abnormal   Collection Time: 07/02/17  1:34 AM  Result Value Ref Range   Sodium 140 135 - 145 mmol/L   Potassium 4.3 3.5 - 5.1 mmol/L   Chloride 104 101 - 111 mmol/L   CO2 29 22 - 32 mmol/L   Glucose, Bld 137 (H) 65 - 99 mg/dL   BUN 16 6 - 20 mg/dL   Creatinine, Ser 0.90 0.61 - 1.24 mg/dL   Calcium 9.3 8.9 - 10.3 mg/dL   GFR calc non Af Amer >60 >60 mL/min   GFR calc Af Amer >60 >60 mL/min    Comment: (NOTE) The eGFR has been  calculated using the CKD EPI equation. This calculation has not been validated in all clinical situations. eGFR's persistently <60 mL/min signify possible Chronic Kidney Disease.    Anion gap 7 5 - 15  Glucose, capillary     Status: Abnormal   Collection Time: 07/02/17  7:28 AM   Result Value Ref Range   Glucose-Capillary 129 (H) 65 - 99 mg/dL  Glucose, capillary     Status: Abnormal   Collection Time: 07/02/17 12:00 PM  Result Value Ref Range   Glucose-Capillary 100 (H) 65 - 99 mg/dL  CBC with Differential     Status: Abnormal   Collection Time: 07/31/17  3:55 PM  Result Value Ref Range   WBC 8.5 3.8 - 10.6 K/uL   RBC 3.89 (L) 4.40 - 5.90 MIL/uL   Hemoglobin 12.5 (L) 13.0 - 18.0 g/dL   HCT 36.7 (L) 40.0 - 52.0 %   MCV 94.4 80.0 - 100.0 fL   MCH 32.2 26.0 - 34.0 pg   MCHC 34.1 32.0 - 36.0 g/dL   RDW 13.4 11.5 - 14.5 %   Platelets 264 150 - 440 K/uL   Neutrophils Relative % 48 %   Neutro Abs 4.0 1.4 - 6.5 K/uL   Lymphocytes Relative 40 %   Lymphs Abs 3.4 1.0 - 3.6 K/uL   Monocytes Relative 8 %   Monocytes Absolute 0.7 0.2 - 1.0 K/uL   Eosinophils Relative 3 %   Eosinophils Absolute 0.3 0 - 0.7 K/uL   Basophils Relative 1 %   Basophils Absolute 0.1 0 - 0.1 K/uL  Comprehensive metabolic panel     Status: Abnormal   Collection Time: 07/31/17  3:55 PM  Result Value Ref Range   Sodium 140 135 - 145 mmol/L   Potassium 3.9 3.5 - 5.1 mmol/L   Chloride 105 101 - 111 mmol/L   CO2 28 22 - 32 mmol/L   Glucose, Bld 130 (H) 65 - 99 mg/dL   BUN 14 6 - 20 mg/dL   Creatinine, Ser 0.88 0.61 - 1.24 mg/dL   Calcium 9.3 8.9 - 10.3 mg/dL   Total Protein 7.4 6.5 - 8.1 g/dL   Albumin 3.6 3.5 - 5.0 g/dL   AST 17 15 - 41 U/L   ALT 19 17 - 63 U/L   Alkaline Phosphatase 61 38 - 126 U/L   Total Bilirubin 0.4 0.3 - 1.2 mg/dL   GFR calc non Af Amer >60 >60 mL/min   GFR calc Af Amer >60 >60 mL/min    Comment: (NOTE) The eGFR has been calculated using the CKD EPI equation. This calculation has not been validated in all clinical situations. eGFR's persistently <60 mL/min signify possible Chronic Kidney Disease.    Anion gap 7 5 - 15  Lactate dehydrogenase     Status: None   Collection Time: 07/31/17  3:55 PM  Result Value Ref Range   LDH 132 98 - 192 U/L      Assessment & Plan  1. Near syncope - CBC w/Diff/Platelet - Orthostatic vital signs - normal.  - Comprehensive metabolic panel - Sees Dr. Manuella Ghazi (PCP) 08/27/2017, sees Vascular 08/11/2017 - If labs are normal, we will provide note stating that patient may return to work - Advised patient to remain very well hydrated during work hours.  2. Elevated BP without diagnosis of hypertension Continue Lisinopril daily 2.24m  3. DM (diabetes mellitus), type 2 with peripheral vascular complications (HCC) Continue Metformin and Levimir daily as prescribed. Checks BG's at home  daily - continue to do so (has been running in the 130's-140)  -Red flags and when to present for emergency care or RTC including fever >101.25F, chest pain, shortness of breath, syncope or another near-syncopal episode, confusion, speech difficulty, new/worsening/un-resolving symptoms, reviewed with patient at time of visit. Follow up and care instructions discussed and provided in AVS.  I have reviewed this encounter including the documentation in this note and/or discussed this patient with the Johney Maine, FNP, NP-C. I am certifying that I agree with the content of this note as supervising physician.  Steele Sizer, MD [facility] 08/08/2017, 4:17 PM

## 2017-08-05 NOTE — Telephone Encounter (Signed)
Patient returned to clinic to discuss results, and upon discussion with patient and with Dr. Sherryll BurgerShah, PCP, it is determined that pt's elevated BUN (23) may be attributed to mild dehydration. Dr. Sherryll BurgerShah recommends patient remain out of work until Monday 08/10/2017 - discussed this determination in person with Dr. Sherryll BurgerShah and the patient. Note is provided. Patient is encouraged to drink plenty of fluids and stay hydrated. If he should develop any new or concerning symptoms, have another pre-syncopal episode, or any other concerns, he may return to our clinic for re-evaluation.

## 2017-08-11 ENCOUNTER — Encounter (INDEPENDENT_AMBULATORY_CARE_PROVIDER_SITE_OTHER): Payer: Self-pay | Admitting: Vascular Surgery

## 2017-08-11 ENCOUNTER — Ambulatory Visit (INDEPENDENT_AMBULATORY_CARE_PROVIDER_SITE_OTHER): Payer: BLUE CROSS/BLUE SHIELD | Admitting: Vascular Surgery

## 2017-08-11 VITALS — BP 127/78 | HR 89 | Resp 15 | Ht 71.0 in | Wt 232.0 lb

## 2017-08-11 DIAGNOSIS — E1151 Type 2 diabetes mellitus with diabetic peripheral angiopathy without gangrene: Secondary | ICD-10-CM

## 2017-08-11 DIAGNOSIS — L03119 Cellulitis of unspecified part of limb: Secondary | ICD-10-CM | POA: Diagnosis not present

## 2017-08-11 DIAGNOSIS — E7800 Pure hypercholesterolemia, unspecified: Secondary | ICD-10-CM

## 2017-08-11 DIAGNOSIS — I739 Peripheral vascular disease, unspecified: Secondary | ICD-10-CM

## 2017-08-11 NOTE — Progress Notes (Signed)
Subjective:    Patient ID: Donald [name], male    DOB: 1965-12-22, 51 y.o.   MRN: [id number] Chief Complaint  Patient presents with  . New Patient (Initial Visit)    Cellulitis and PVD   Patient presents at the request of Dr. Marzetta Board status post an episode of bilateral lower extremity cellulitis. Patient informs one episode of cellulitis starting with his right leg been affecting his left. Currently, the patient does not wear compression stockings or elevate his legs heart level or higher. Patient denies any claudication or rest pain or ulcerations to his lower extremities. Patient was hospitalized for his cellulitis. The patient has not had recurrent symptoms.   Review of Systems  Constitutional: Negative.   HENT: Negative.   Eyes: Negative.   Respiratory: Negative.   Cardiovascular: Negative.   Gastrointestinal: Negative.   Endocrine: Negative.   Genitourinary: Negative.   Musculoskeletal: Negative.   Skin: Positive for color change.  Allergic/Immunologic: Negative.   Neurological: Negative.   Hematological: Negative.   Psychiatric/Behavioral: Negative.       Objective:   Physical Exam  Constitutional: He is oriented to person, place, and time. He appears well-developed and well-nourished. No distress.  HENT:  Head: Normocephalic and atraumatic.  Eyes: Pupils are equal, round, and reactive to light. Conjunctivae are normal.  Neck: Normal range of motion.  Cardiovascular: Normal rate, regular rhythm, normal heart sounds and intact distal pulses.   Pulses:      Radial pulses are 2+ on the right side, and 2+ on the left side.       Dorsalis pedis pulses are 1+ on the right side, and 1+ on the left side.       Posterior tibial pulses are 1+ on the right side, and 1+ on the left side.  Pulmonary/Chest: Effort normal.  Musculoskeletal: Normal range of motion. He exhibits no edema.  Neurological: He is alert and oriented to person, place, and time.  Skin: Skin is warm and dry.  He is not diaphoretic.  Psychiatric: He has a normal mood and affect. His behavior is normal. Judgment and thought content normal.  Vitals reviewed.  BP 127/78 (BP Location: Right Arm)   Pulse 89   Resp 15   Ht _0  (1.803 m)   Wt 232 lb (105.2 kg)   BMI 32.36 kg/m   Past Medical History:  Diagnosis Date  . Diabetes mellitus without complication ([place])   . Gout    R foot great toe   . Hypercholesteremia 2017  . Hypertension     Social History   Social History  . Marital status: Married    Spouse name: N/A  . Number of children: N/A  . Years of education: N/A   Occupational History  . Not on file.   Social History Main Topics  . Smoking status: Current Every Day Smoker    Packs/day: 1.00    Years: 30.00    Types: Cigarettes  . Smokeless tobacco: Never Used  . Alcohol use Yes     Comment: no etoh x 1 month .previously social drinker  . Drug use: No  . Sexual activity: Yes    Partners: Female   Other Topics Concern  . Not on file   Social History Narrative  . No narrative on file    Past Surgical History:  Procedure Laterality Date  . KNEE SURGERY Right     Family History  Problem Relation Age of Onset  . Hypertension Father   .  Diabetes Father   . Liver disease Father   . Diverticulitis Mother   . Migraines Mother   . Cancer Sister        tumors in the breast   . Diabetes Maternal Grandfather   . Cancer Sister        tumors in the breast   . Cancer Sister        tumor in the breast     Allergies  Allergen Reactions  . No Known Allergies        Assessment & Plan:  Patient presents at the request of Dr. Marzetta Board status post an episode of bilateral lower extremity cellulitis. Patient informs one episode of cellulitis starting with his right leg been affecting his left. Currently, the patient does not wear compression stockings or elevate his legs heart level or higher. Patient denies any claudication or rest pain or ulcerations to his lower  extremities. Patient was hospitalized for his cellulitis. The patient has not had recurrent symptoms.  1. Cellulitis of lower leg - resolved Patient currently does not have any signs of cellulitis to the bilateral lower extremity. The patient was encouraged to wear graduated compression stockings (20-30 mmHg) on a daily basis. The patient was instructed to begin wearing the stockings first thing in the morning and removing them in the evening. The patient was instructed specifically not to sleep in the stockings. Prescription In addition, behavioral modification including elevation during the day will be initiated. Anti-inflammatories for pain. We'll bring the patient back in 3 months to rule out any contributing venous reflux.  - VAS [place] LOWER EXTREMITY VENOUS REFLUX; Future  2. Pure hypercholesterolemia - stable Encouraged good control as its slows the progression of atherosclerotic disease  3. DM (diabetes mellitus), type 2 with peripheral vascular complications (HCC) - will Encouraged good control as its slows the progression of atherosclerotic disease  4. Peripheral vascular insufficiency ([facility]) - stable Currently the patient is asymptomatic.  Current Outpatient Prescriptions on File Prior to Visit  Medication Sig Dispense Refill  . aspirin EC 81 MG EC tablet Take 1 tablet (81 mg total) by mouth daily.    . Blood Glucose Monitoring Suppl (D-CARE GLUCOMETER) w/Device KIT 1 Units by Does not apply route 4 (four) times daily -  before meals and at bedtime. 1 kit 0  . fluocinonide-emollient (LIDEX-E) 0.05 % cream Apply 1 application topically 2 (two) times daily. 60 g 0  . Insulin Detemir (LEVEMIR FLEXPEN) 100 UNIT/ML Pen Inject 15 Units into the skin daily at 10 pm. 15 mL 1  . lisinopril (PRINIVIL,ZESTRIL) 2.5 MG tablet Take 1 tablet (2.5 mg total) by mouth daily. 90 tablet 0  . meloxicam (MOBIC) 7.5 MG tablet TAKE 1 TABLET BY MOUTH EVERY DAY 30 tablet 1  . metFORMIN (GLUCOPHAGE) 500 MG  tablet TAKE 1 TABLET (500 MG TOTAL) BY MOUTH 2 (TWO) TIMES DAILY WITH A MEAL. 180 tablet 0  . simvastatin (ZOCOR) 10 MG tablet TAKE 1 TABLET (10 MG TOTAL) BY MOUTH DAILY. 90 tablet 0  . Vitamin D, Ergocalciferol, (DRISDOL) 50000 units CAPS capsule Take 1 capsule (50,000 Units total) by mouth once a week. For 12 weeks (Patient not taking: Reported on 08/11/2017) 12 capsule 0   No current facility-administered medications on file prior to visit.     There are no Patient Instructions on file for this visit. No Follow-up on file.   Erandy Mceachern A Isaih Bulger, PA-C

## 2017-08-12 ENCOUNTER — Other Ambulatory Visit: Payer: Self-pay | Admitting: Family Medicine

## 2017-08-12 DIAGNOSIS — E119 Type 2 diabetes mellitus without complications: Secondary | ICD-10-CM

## 2017-08-12 DIAGNOSIS — Z794 Long term (current) use of insulin: Principal | ICD-10-CM

## 2017-08-26 ENCOUNTER — Other Ambulatory Visit: Payer: Self-pay | Admitting: Family Medicine

## 2017-08-27 ENCOUNTER — Encounter: Payer: Self-pay | Admitting: Family Medicine

## 2017-08-27 ENCOUNTER — Ambulatory Visit (INDEPENDENT_AMBULATORY_CARE_PROVIDER_SITE_OTHER): Payer: BLUE CROSS/BLUE SHIELD | Admitting: Family Medicine

## 2017-08-27 VITALS — BP 156/88 | HR 97 | Temp 98.4°F | Resp 16 | Ht 71.0 in | Wt 233.8 lb

## 2017-08-27 DIAGNOSIS — E119 Type 2 diabetes mellitus without complications: Secondary | ICD-10-CM

## 2017-08-27 DIAGNOSIS — I10 Essential (primary) hypertension: Secondary | ICD-10-CM

## 2017-08-27 DIAGNOSIS — Z23 Encounter for immunization: Secondary | ICD-10-CM

## 2017-08-27 DIAGNOSIS — Z794 Long term (current) use of insulin: Secondary | ICD-10-CM | POA: Diagnosis not present

## 2017-08-27 DIAGNOSIS — E7800 Pure hypercholesterolemia, unspecified: Secondary | ICD-10-CM | POA: Diagnosis not present

## 2017-08-27 LAB — POCT GLYCOSYLATED HEMOGLOBIN (HGB A1C): HEMOGLOBIN A1C: 6.7

## 2017-08-27 LAB — LIPID PANEL
CHOLESTEROL: 172 mg/dL (ref <200)
HDL: 43 mg/dL (ref >40)
LDL Cholesterol (Calc): 103 mg/dL (calc) — ABNORMAL HIGH
Non-HDL Cholesterol (Calc): 129 mg/dL (calc) (ref <130)
Total CHOL/HDL Ratio: 4 (calc) (ref <5.0)
Triglycerides: 157 mg/dL — ABNORMAL HIGH (ref <150)

## 2017-08-27 MED ORDER — LISINOPRIL 5 MG PO TABS: 5.0000 mg | ORAL_TABLET | Freq: Every day | ORAL | 0 refills | Status: DC

## 2017-08-27 MED ORDER — SIMVASTATIN 10 MG PO TABS: 10.0000 mg | ORAL_TABLET | Freq: Every day | ORAL | 0 refills | Status: DC

## 2017-08-27 MED ORDER — INSULIN DETEMIR 100 UNIT/ML FLEXPEN: 10.0000 [IU] | PEN_INJECTOR | Freq: Every day | SUBCUTANEOUS | 2 refills | Status: DC

## 2017-08-27 MED ORDER — METFORMIN HCL 1000 MG PO TABS: 1000.0000 mg | ORAL_TABLET | Freq: Two times a day (BID) | ORAL | 0 refills | Status: DC

## 2017-08-27 NOTE — Progress Notes (Signed)
Name: Donald [name]   MRN: [id number]    DOB: 03-14-1966   Date:08/27/2017       Progress Note  Subjective  Chief Complaint  Chief Complaint  Patient presents with  . Hyperlipidemia    f/u  . Diabetes    f/u  . Medication Refill    simvastain  . Hypertension    Pt feeling lightheaded and blurred vision. Just started coming back to work. It happens after siting and standing up and picking up heavy items    Hypertension  This is a recurrent problem. The problem has been gradually worsening since onset. The problem is uncontrolled. Pertinent negatives include no blurred vision, chest pain, headaches or shortness of breath. Risk factors for coronary artery disease include dyslipidemia and diabetes mellitus. Past treatments include ACE inhibitors. There is no history of kidney disease, CAD/MI or CVA.  Hyperlipidemia  This is a chronic problem. The problem is controlled. Recent lipid tests were reviewed and are normal. Pertinent negatives include no chest pain, leg pain, myalgias or shortness of breath. Current antihyperlipidemic treatment includes statins. Risk factors for coronary artery disease include dyslipidemia, diabetes mellitus, hypertension and male sex.  Diabetes  He presents for his follow-up diabetic visit. He has type 2 diabetes mellitus. His disease course has been stable. There are no hypoglycemic associated symptoms. Pertinent negatives for hypoglycemia include no headaches. Pertinent negatives for diabetes include no blurred vision, no chest pain, no fatigue, no foot paresthesias, no polydipsia and no polyuria. Pertinent negatives for diabetic complications include no CVA. Current diabetic treatment includes insulin injections and oral agent (monotherapy). He is compliant with treatment all of the time. He is following a diabetic and generally healthy diet. He participates in exercise three times a week. There is no change in his home blood glucose trend. His breakfast blood  glucose range is generally 110-130 mg/dl. An ACE inhibitor/angiotensin II receptor blocker is being taken.     Past Medical History:  Diagnosis Date  . Diabetes mellitus without complication ([place])   . Gout    R foot great toe   . Hypercholesteremia 2017  . Hypertension     Past Surgical History:  Procedure Laterality Date  . KNEE SURGERY Right     Family History  Problem Relation Age of Onset  . Hypertension Father   . Diabetes Father   . Liver disease Father   . Diverticulitis Mother   . Migraines Mother   . Cancer Sister        tumors in the breast   . Diabetes Maternal Grandfather   . Cancer Sister        tumors in the breast   . Cancer Sister        tumor in the breast     Social History   Social History  . Marital status: Married    Spouse name: N/A  . Number of children: N/A  . Years of education: N/A   Occupational History  . Not on file.   Social History Main Topics  . Smoking status: Current Every Day Smoker    Packs/day: 1.00    Years: 30.00    Types: Cigarettes  . Smokeless tobacco: Never Used  . Alcohol use Yes     Comment: no etoh x 1 month .previously social drinker  . Drug use: No  . Sexual activity: Yes    Partners: Female   Other Topics Concern  . Not on file   Social History Narrative  .  No narrative on file     Current Outpatient Prescriptions:  .  aspirin EC 81 MG EC tablet, Take 1 tablet (81 mg total) by mouth daily., Disp: , Rfl:  .  Blood Glucose Monitoring Suppl (D-CARE GLUCOMETER) w/Device KIT, 1 Units by Does not apply route 4 (four) times daily -  before meals and at bedtime., Disp: 1 kit, Rfl: 0 .  fluocinonide-emollient (LIDEX-E) 0.05 % cream, Apply 1 application topically 2 (two) times daily., Disp: 60 g, Rfl: 0 .  LEVEMIR FLEXTOUCH 100 UNIT/ML Pen, INJECT 15 UNITS INTO THE SKIN DAILY AT 10 PM., Disp: 3 pen, Rfl: 2 .  lisinopril (PRINIVIL,ZESTRIL) 2.5 MG tablet, Take 1 tablet (2.5 mg total) by mouth daily., Disp: 90  tablet, Rfl: 0 .  meloxicam (MOBIC) 7.5 MG tablet, TAKE 1 TABLET BY MOUTH EVERY DAY, Disp: 30 tablet, Rfl: 1 .  metFORMIN (GLUCOPHAGE) 500 MG tablet, TAKE 1 TABLET (500 MG TOTAL) BY MOUTH 2 (TWO) TIMES DAILY WITH A MEAL., Disp: 180 tablet, Rfl: 0 .  simvastatin (ZOCOR) 10 MG tablet, TAKE 1 TABLET (10 MG TOTAL) BY MOUTH DAILY., Disp: 90 tablet, Rfl: 0 .  lisinopril (PRINIVIL,ZESTRIL) 2.5 MG tablet, TAKE 1 TABLET BY MOUTH EVERY DAY (Patient not taking: Reported on 08/27/2017), Disp: 90 tablet, Rfl: 0 .  Vitamin D, Ergocalciferol, (DRISDOL) 50000 units CAPS capsule, Take 1 capsule (50,000 Units total) by mouth once a week. For 12 weeks (Patient not taking: Reported on 08/11/2017), Disp: 12 capsule, Rfl: 0  Allergies  Allergen Reactions  . No Known Allergies      Review of Systems  Constitutional: Negative for fatigue.  Eyes: Negative for blurred vision.  Respiratory: Negative for shortness of breath.   Cardiovascular: Negative for chest pain.  Musculoskeletal: Negative for myalgias.  Neurological: Negative for headaches.  Endo/Heme/Allergies: Negative for polydipsia.     Objective  Vitals:   08/27/17 0829  BP: (!) 156/88  Pulse: 97  Resp: 16  Temp: 98.4 F (36.9 C)  TempSrc: Oral  SpO2: 97%  Weight: 233 lb 12.8 oz (106.1 kg)  Height: _0  (1.803 m)    Physical Exam  Constitutional: He is oriented to person, place, and time and well-developed, well-nourished, and in no distress.  HENT:  Head: Normocephalic and atraumatic.  Cardiovascular: Normal rate, regular rhythm and normal heart sounds.   No murmur heard. Pulmonary/Chest: Effort normal and breath sounds normal. He has no wheezes.  Abdominal: Soft. Bowel sounds are normal. There is no tenderness.  Neurological: He is alert and oriented to person, place, and time.  Psychiatric: Mood, memory, affect and judgment normal.  Nursing note and vitals reviewed.     Assessment & Plan  1. Needs flu shot  - Flu Vaccine  QUAD 6+ mos PF IM (Fluarix Quad PF)  2. Essential hypertension Blood pressure was elevated, increase lisinopril to 5 mg daily, refills provided and reassess in 3 months - lisinopril (PRINIVIL,ZESTRIL) 5 MG tablet; Take 1 tablet (5 mg total) by mouth daily.  Dispense: 90 tablet; Refill: 0  3. Pure hypercholesterolemia Repeat FLP, continue on statin - simvastatin (ZOCOR) 10 MG tablet; Take 1 tablet (10 mg total) by mouth daily.  Dispense: 90 tablet; Refill: 0 - Lipid panel  4. Type 2 diabetes mellitus without complication, with long-term current use of insulin (HCC) A1c 6.7%, well-controlled diabetes, increase metformin to 1000 mg twice a day, decrease Levemir to 10 units at bedtime consider I would like to DC insulin and replace with other oral  agents if needed within the next 3-6 months. Patient is in agreement with the plan. - POCT HgB A1C - Insulin Detemir (LEVEMIR FLEXTOUCH) 100 UNIT/ML Pen; Inject 10 Units into the skin daily at 10 pm.  Dispense: 3 pen; Refill: 2 - metFORMIN (GLUCOPHAGE) 1000 MG tablet; Take 1 tablet (1,000 mg total) by mouth 2 (two) times daily with a meal.  Dispense: 180 tablet; Refill: 0 - Urine Microalbumin w/creat. ratio    Farrel Guimond [facility] 08/27/2017 8:40 AM

## 2017-08-28 LAB — MICROALBUMIN / CREATININE URINE RATIO
CREATININE, URINE: 224 mg/dL (ref 20–320)
Microalb Creat Ratio: 12 mcg/mg creat (ref <30)
Microalb, Ur: 2.6 mg/dL

## 2017-09-09 ENCOUNTER — Ambulatory Visit: Admission: RE | Admit: 2017-09-09 | Payer: BLUE CROSS/BLUE SHIELD | Source: Ambulatory Visit

## 2017-09-10 ENCOUNTER — Inpatient Hospital Stay: Payer: BLUE CROSS/BLUE SHIELD | Attending: Oncology | Admitting: Oncology

## 2017-09-10 DIAGNOSIS — Z79899 Other long term (current) drug therapy: Secondary | ICD-10-CM | POA: Insufficient documentation

## 2017-09-10 DIAGNOSIS — F17210 Nicotine dependence, cigarettes, uncomplicated: Secondary | ICD-10-CM | POA: Insufficient documentation

## 2017-09-10 DIAGNOSIS — R590 Localized enlarged lymph nodes: Secondary | ICD-10-CM | POA: Insufficient documentation

## 2017-09-10 DIAGNOSIS — E119 Type 2 diabetes mellitus without complications: Secondary | ICD-10-CM | POA: Insufficient documentation

## 2017-09-10 DIAGNOSIS — M109 Gout, unspecified: Secondary | ICD-10-CM | POA: Insufficient documentation

## 2017-09-10 DIAGNOSIS — Z794 Long term (current) use of insulin: Secondary | ICD-10-CM | POA: Insufficient documentation

## 2017-09-10 DIAGNOSIS — I10 Essential (primary) hypertension: Secondary | ICD-10-CM | POA: Insufficient documentation

## 2017-09-10 DIAGNOSIS — E7800 Pure hypercholesterolemia, unspecified: Secondary | ICD-10-CM | POA: Insufficient documentation

## 2017-09-10 DIAGNOSIS — Z7982 Long term (current) use of aspirin: Secondary | ICD-10-CM | POA: Insufficient documentation

## 2017-09-11 ENCOUNTER — Ambulatory Visit: Payer: BLUE CROSS/BLUE SHIELD | Admitting: Oncology

## 2017-09-15 ENCOUNTER — Ambulatory Visit (INDEPENDENT_AMBULATORY_CARE_PROVIDER_SITE_OTHER): Payer: BLUE CROSS/BLUE SHIELD | Admitting: Vascular Surgery

## 2017-09-15 ENCOUNTER — Ambulatory Visit (INDEPENDENT_AMBULATORY_CARE_PROVIDER_SITE_OTHER): Payer: BLUE CROSS/BLUE SHIELD

## 2017-09-15 ENCOUNTER — Encounter (INDEPENDENT_AMBULATORY_CARE_PROVIDER_SITE_OTHER): Payer: Self-pay | Admitting: Vascular Surgery

## 2017-09-15 VITALS — BP 158/78 | HR 72 | Resp 16 | Ht 71.5 in | Wt 237.0 lb

## 2017-09-15 DIAGNOSIS — E7800 Pure hypercholesterolemia, unspecified: Secondary | ICD-10-CM | POA: Diagnosis not present

## 2017-09-15 DIAGNOSIS — E1151 Type 2 diabetes mellitus with diabetic peripheral angiopathy without gangrene: Secondary | ICD-10-CM | POA: Diagnosis not present

## 2017-09-15 DIAGNOSIS — L03119 Cellulitis of unspecified part of limb: Secondary | ICD-10-CM

## 2017-09-15 DIAGNOSIS — I10 Essential (primary) hypertension: Secondary | ICD-10-CM | POA: Insufficient documentation

## 2017-09-15 NOTE — Assessment & Plan Note (Signed)
blood pressure control important in reducing the progression of atherosclerotic disease. On appropriate oral medications.  

## 2017-09-15 NOTE — Progress Notes (Signed)
MRN : [id number]  Donald [name] is a 51 y.o. (1966/02/01) male who presents with chief complaint of  Chief Complaint  Patient presents with  . Follow-up    Ultrasound follow up  .  History of Present Illness: Patient returns in follow up for leg swelling and previous cellulitis.  His leg swelling is actually improved and he has not had recurrent cellulitis since his last visit.  He has no significant pain or problems with his legs today.  His noninvasive studies today demonstrate a Baker's cyst on the left knee with some deep venous reflux in the right femoral and popliteal veins in the left common femoral vein.  No DVT or superficial thrombophlebitis was seen.  No superficial venous reflux was seen.    Past Medical History:  Diagnosis Date  . Diabetes mellitus without complication ([place])   . Gout    R foot great toe   . Hypercholesteremia 2017  . Hypertension     Past Surgical History:  Procedure Laterality Date  . KNEE SURGERY Right     Social History Social History  Substance Use Topics  . Smoking status: Current Every Day Smoker    Packs/day: 1.00    Years: 30.00    Types: Cigarettes  . Smokeless tobacco: Never Used  . Alcohol use Yes     Comment: no etoh x 1 month .previously social drinker     Family History Family History  Problem Relation Age of Onset  . Hypertension Father   . Diabetes Father   . Liver disease Father   . Diverticulitis Mother   . Migraines Mother   . Cancer Sister        tumors in the breast   . Diabetes Maternal Grandfather   . Cancer Sister        tumors in the breast   . Cancer Sister        tumor in the breast      Current Outpatient Prescriptions  Medication Sig Dispense Refill  . aspirin EC 81 MG EC tablet Take 1 tablet (81 mg total) by mouth daily.    . Blood Glucose Monitoring Suppl (D-CARE GLUCOMETER) w/Device KIT 1 Units by Does not apply route 4 (four) times daily -  before meals and at bedtime. 1 kit 0  .  fluocinonide-emollient (LIDEX-E) 0.05 % cream Apply 1 application topically 2 (two) times daily. 60 g 0  . Insulin Detemir (LEVEMIR FLEXTOUCH) 100 UNIT/ML Pen Inject 10 Units into the skin daily at 10 pm. 3 pen 2  . lisinopril (PRINIVIL,ZESTRIL) 5 MG tablet Take 1 tablet (5 mg total) by mouth daily. 90 tablet 0  . meloxicam (MOBIC) 7.5 MG tablet TAKE 1 TABLET BY MOUTH EVERY DAY 30 tablet 1  . metFORMIN (GLUCOPHAGE) 1000 MG tablet Take 1 tablet (1,000 mg total) by mouth 2 (two) times daily with a meal. 180 tablet 0  . simvastatin (ZOCOR) 10 MG tablet Take 1 tablet (10 mg total) by mouth daily. 90 tablet 0  . Vitamin D, Ergocalciferol, (DRISDOL) 50000 units CAPS capsule Take 1 capsule (50,000 Units total) by mouth once a week. For 12 weeks (Patient not taking: Reported on 08/11/2017) 12 capsule 0   No current facility-administered medications for this visit.     Allergies  Allergen Reactions  . No Known Allergies      REVIEW OF SYSTEMS (Negative unless checked)  Constitutional: [] Weight loss  [] Fever  [] Chills Cardiac: [] Chest pain   [] Chest pressure   []   Palpitations   [] Shortness of breath when laying flat   [] Shortness of breath at rest   [] Shortness of breath with exertion. Vascular:  [x] Pain in legs with walking   [] Pain in legs at rest   [] Pain in legs when laying flat   [] Claudication   [] Pain in feet when walking  [] Pain in feet at rest  [] Pain in feet when laying flat   [] History of DVT   [] Phlebitis   [x] Swelling in legs   [] Varicose veins   [x] Non-healing ulcers Pulmonary:   [] Uses home oxygen   [] Productive cough   [] Hemoptysis   [] Wheeze  [] COPD    Neurologic:  [] Dizziness  [] Blackouts   [] Seizures   [] History of stroke   [] History of TIA  [] Aphasia   [] Temporary blindness   [] Dysphagia   [] Weakness or numbness in arms   [] Weakness or numbness in legs Musculoskeletal:  [] Arthritis   [] Joint swelling   [] Joint pain   [] Low back pain Hematologic:  [] Easy bruising  [] Easy bleeding    [] Hypercoagulable state   [] Anemic  [] Thrombocytopenia Gastrointestinal:  [] Blood in stool   [] Vomiting blood  [] Gastroesophageal reflux/heartburn   [] Difficulty swallowing. Genitourinary:  [] Chronic kidney disease   [] Difficult urination  [] Frequent urination  [] Burning with urination   [] Blood in urine Skin:  [] Rashes   [] Ulcers   [] Wounds Psychological:  [] History of anxiety   []  History of major depression.  Physical Examination  Vitals:   09/15/17 1606  BP: (!) 158/78  Pulse: 72  Resp: 16  Weight: 237 lb (107.5 kg)  Height: 5' 11.5" (1.816 m)   Body mass index is 32.59 kg/m. Gen:  WD/WN, NAD, appears older than stated age Head: [place]/AT, No temporalis wasting. Ear/Nose/Throat: Hearing grossly intact, dentition poor, trachea midline Eyes: Conjunctiva clear. Sclera non-icteric Neck: Supple.  No JVD. Trachea midline Pulmonary:  Good air movement, respirations not labored, no use of accessory muscles.  Cardiac: RRR, normal S1, S2. Vascular:  Vessel Right Left  Radial Palpable Palpable                                    Musculoskeletal: M/S 5/5 throughout.  No deformity or atrophy.  1+ bilateral lower extremity edema. Neurologic: Sensation grossly intact in extremities.  Symmetrical.  Speech is fluent. Psychiatric: Judgment intact, Mood & affect appropriate for pt's clinical situation. Dermatologic: No rashes or ulcers noted.  No cellulitis or open wounds.      Labs Recent Results (from the past 2160 hour(s))  CBC with Differential     Status: Abnormal   Collection Time: 06/25/17  2:22 PM  Result Value Ref Range   WBC 15.6 (H) 3.8 - 10.8 K/uL   RBC 4.25 4.20 - 5.80 MIL/uL   Hemoglobin 13.7 13.2 - 17.1 g/dL   HCT 40.7 38.5 - 50.0 %   MCV 95.8 80.0 - 100.0 fL   MCH 32.2 27.0 - 33.0 pg   MCHC 33.7 32.0 - 36.0 g/dL   RDW 14.1 11.0 - 15.0 %   Platelets 329 140 - 400 K/uL   MPV 10.7 7.5 - 12.5 fL   Neutro Abs 11,856 (H) 1,500 - 7,800 cells/uL   Lymphs Abs 2,652  850 - 3,900 cells/uL   Monocytes Absolute 936 200 - 950 cells/uL   Eosinophils Absolute 156 15 - 500 cells/uL   Basophils Absolute 0 0 - 200 cells/uL   Neutrophils Relative % 76 %  Lymphocytes Relative 17 %   Monocytes Relative 6 %   Eosinophils Relative 1 %   Basophils Relative 0 %   Smear Review Criteria for review not met   COMPLETE METABOLIC PANEL WITH GFR     Status: None   Collection Time: 06/25/17  2:22 PM  Result Value Ref Range   Sodium 140 135 - 146 mmol/L   Potassium 4.3 3.5 - 5.3 mmol/L   Chloride 106 98 - 110 mmol/L   CO2 24 20 - 31 mmol/L   Glucose, Bld 86 65 - 99 mg/dL   BUN 16 7 - 25 mg/dL   Creat 0.92 0.70 - 1.33 mg/dL    Comment:   For patients > or = 53 years of age: The upper reference limit for Creatinine is approximately 13% higher for people identified as African-American.      Total Bilirubin 0.3 0.2 - 1.2 mg/dL   Alkaline Phosphatase 69 40 - 115 U/L   AST 13 10 - 35 U/L   ALT 15 9 - 46 U/L   Total Protein 7.3 6.1 - 8.1 g/dL   Albumin 4.2 3.6 - 5.1 g/dL   Calcium 9.5 8.6 - 10.3 mg/dL   GFR, Est African American >89 >=60 mL/min   GFR, Est Non African American >89 >=60 mL/min  VITAMIN D 25 Hydroxy (Vit-D Deficiency, Fractures)     Status: None   Collection Time: 06/25/17  2:22 PM  Result Value Ref Range   Vit D, 25-Hydroxy 31 30 - 100 ng/mL    Comment: Vitamin D Status           25-OH Vitamin D        Deficiency                <20 ng/mL        Insufficiency         20 - 29 ng/mL        Optimal             > or = 30 ng/mL   For 25-OH Vitamin D testing on patients on D2-supplementation and patients for whom quantitation of D2 and D3 fractions is required, the [facility] 25-OH VIT D, (D2,D3), LC/MS/MS is recommended: order code [phone] (patients > 2 yrs).   Comprehensive metabolic panel     Status: Abnormal   Collection Time: 06/30/17  6:03 PM  Result Value Ref Range   Sodium 142 135 - 145 mmol/L   Potassium 4.1 3.5 - 5.1 mmol/L   Chloride 104  101 - 111 mmol/L   CO2 29 22 - 32 mmol/L   Glucose, Bld 102 (H) 65 - 99 mg/dL   BUN 17 6 - 20 mg/dL   Creatinine, Ser 0.95 0.61 - 1.24 mg/dL   Calcium 9.7 8.9 - 10.3 mg/dL   Total Protein 8.3 (H) 6.5 - 8.1 g/dL   Albumin 4.0 3.5 - 5.0 g/dL   AST 14 (L) 15 - 41 U/L   ALT 14 (L) 17 - 63 U/L   Alkaline Phosphatase 72 38 - 126 U/L   Total Bilirubin 0.5 0.3 - 1.2 mg/dL   GFR calc non Af Amer >60 >60 mL/min   GFR calc Af Amer >60 >60 mL/min    Comment: (NOTE) The eGFR has been calculated using the CKD EPI equation. This calculation has not been validated in all clinical situations. eGFR's persistently <60 mL/min signify possible Chronic Kidney Disease.    Anion gap 9 5 - 15  CBC with Differential     Status: Abnormal   Collection Time: 06/30/17  6:03 PM  Result Value Ref Range   WBC 12.4 (H) 3.8 - 10.6 K/uL   RBC 4.23 (L) 4.40 - 5.90 MIL/uL   Hemoglobin 13.7 13.0 - 18.0 g/dL   HCT 39.6 (L) 40.0 - 52.0 %   MCV 93.5 80.0 - 100.0 fL   MCH 32.3 26.0 - 34.0 pg   MCHC 34.6 32.0 - 36.0 g/dL   RDW 13.6 11.5 - 14.5 %   Platelets 325 150 - 440 K/uL   Neutrophils Relative % 64 %   Neutro Abs 8.0 (H) 1.4 - 6.5 K/uL   Lymphocytes Relative 25 %   Lymphs Abs 3.1 1.0 - 3.6 K/uL   Monocytes Relative 8 %   Monocytes Absolute 0.9 0.2 - 1.0 K/uL   Eosinophils Relative 2 %   Eosinophils Absolute 0.2 0 - 0.7 K/uL   Basophils Relative 1 %   Basophils Absolute 0.1 0 - 0.1 K/uL  Glucose, capillary     Status: Abnormal   Collection Time: 06/30/17  9:11 PM  Result Value Ref Range   Glucose-Capillary 122 (H) 65 - 99 mg/dL   Comment 1 Notify RN   CBC     Status: Abnormal   Collection Time: 07/01/17  3:29 AM  Result Value Ref Range   WBC 8.4 3.8 - 10.6 K/uL   RBC 3.91 (L) 4.40 - 5.90 MIL/uL   Hemoglobin 12.6 (L) 13.0 - 18.0 g/dL   HCT 36.4 (L) 40.0 - 52.0 %   MCV 93.1 80.0 - 100.0 fL   MCH 32.3 26.0 - 34.0 pg   MCHC 34.7 32.0 - 36.0 g/dL   RDW 13.8 11.5 - 14.5 %   Platelets 287 150 - 440 K/uL    Basic metabolic panel     Status: Abnormal   Collection Time: 07/01/17  3:29 AM  Result Value Ref Range   Sodium 139 135 - 145 mmol/L   Potassium 3.4 (L) 3.5 - 5.1 mmol/L   Chloride 104 101 - 111 mmol/L   CO2 28 22 - 32 mmol/L   Glucose, Bld 122 (H) 65 - 99 mg/dL   BUN 18 6 - 20 mg/dL   Creatinine, Ser 0.91 0.61 - 1.24 mg/dL   Calcium 9.1 8.9 - 10.3 mg/dL   GFR calc non Af Amer >60 >60 mL/min   GFR calc Af Amer >60 >60 mL/min    Comment: (NOTE) The eGFR has been calculated using the CKD EPI equation. This calculation has not been validated in all clinical situations. eGFR's persistently <60 mL/min signify possible Chronic Kidney Disease.    Anion gap 7 5 - 15  Glucose, capillary     Status: None   Collection Time: 07/01/17  7:38 AM  Result Value Ref Range   Glucose-Capillary 86 65 - 99 mg/dL  Glucose, capillary     Status: None   Collection Time: 07/01/17 11:44 AM  Result Value Ref Range   Glucose-Capillary 94 65 - 99 mg/dL  Glucose, capillary     Status: None   Collection Time: 07/01/17  4:31 PM  Result Value Ref Range   Glucose-Capillary 74 65 - 99 mg/dL  Glucose, capillary     Status: Abnormal   Collection Time: 07/01/17  9:11 PM  Result Value Ref Range   Glucose-Capillary 119 (H) 65 - 99 mg/dL  Vancomycin, trough     Status: Abnormal   Collection Time: 07/02/17  1:34 AM  Result Value Ref Range   Vancomycin Tr 9 (L) 15 - 20 ug/mL  Basic metabolic panel     Status: Abnormal   Collection Time: 07/02/17  1:34 AM  Result Value Ref Range   Sodium 140 135 - 145 mmol/L   Potassium 4.3 3.5 - 5.1 mmol/L   Chloride 104 101 - 111 mmol/L   CO2 29 22 - 32 mmol/L   Glucose, Bld 137 (H) 65 - 99 mg/dL   BUN 16 6 - 20 mg/dL   Creatinine, Ser 0.90 0.61 - 1.24 mg/dL   Calcium 9.3 8.9 - 10.3 mg/dL   GFR calc non Af Amer >60 >60 mL/min   GFR calc Af Amer >60 >60 mL/min    Comment: (NOTE) The eGFR has been calculated using the CKD EPI equation. This calculation has not been  validated in all clinical situations. eGFR's persistently <60 mL/min signify possible Chronic Kidney Disease.    Anion gap 7 5 - 15  Glucose, capillary     Status: Abnormal   Collection Time: 07/02/17  7:28 AM  Result Value Ref Range   Glucose-Capillary 129 (H) 65 - 99 mg/dL  Glucose, capillary     Status: Abnormal   Collection Time: 07/02/17 12:00 PM  Result Value Ref Range   Glucose-Capillary 100 (H) 65 - 99 mg/dL  CBC with Differential     Status: Abnormal   Collection Time: 07/31/17  3:55 PM  Result Value Ref Range   WBC 8.5 3.8 - 10.6 K/uL   RBC 3.89 (L) 4.40 - 5.90 MIL/uL   Hemoglobin 12.5 (L) 13.0 - 18.0 g/dL   HCT 36.7 (L) 40.0 - 52.0 %   MCV 94.4 80.0 - 100.0 fL   MCH 32.2 26.0 - 34.0 pg   MCHC 34.1 32.0 - 36.0 g/dL   RDW 13.4 11.5 - 14.5 %   Platelets 264 150 - 440 K/uL   Neutrophils Relative % 48 %   Neutro Abs 4.0 1.4 - 6.5 K/uL   Lymphocytes Relative 40 %   Lymphs Abs 3.4 1.0 - 3.6 K/uL   Monocytes Relative 8 %   Monocytes Absolute 0.7 0.2 - 1.0 K/uL   Eosinophils Relative 3 %   Eosinophils Absolute 0.3 0 - 0.7 K/uL   Basophils Relative 1 %   Basophils Absolute 0.1 0 - 0.1 K/uL  Comprehensive metabolic panel     Status: Abnormal   Collection Time: 07/31/17  3:55 PM  Result Value Ref Range   Sodium 140 135 - 145 mmol/L   Potassium 3.9 3.5 - 5.1 mmol/L   Chloride 105 101 - 111 mmol/L   CO2 28 22 - 32 mmol/L   Glucose, Bld 130 (H) 65 - 99 mg/dL   BUN 14 6 - 20 mg/dL   Creatinine, Ser 0.88 0.61 - 1.24 mg/dL   Calcium 9.3 8.9 - 10.3 mg/dL   Total Protein 7.4 6.5 - 8.1 g/dL   Albumin 3.6 3.5 - 5.0 g/dL   AST 17 15 - 41 U/L   ALT 19 17 - 63 U/L   Alkaline Phosphatase 61 38 - 126 U/L   Total Bilirubin 0.4 0.3 - 1.2 mg/dL   GFR calc non Af Amer >60 >60 mL/min   GFR calc Af Amer >60 >60 mL/min    Comment: (NOTE) The eGFR has been calculated using the CKD EPI equation. This calculation has not been validated in all clinical situations. eGFR's persistently  <60 mL/min signify possible Chronic Kidney Disease.  Anion gap 7 5 - 15  Lactate dehydrogenase     Status: None   Collection Time: 07/31/17  3:55 PM  Result Value Ref Range   LDH 132 98 - 192 U/L  Comprehensive metabolic panel     Status: Abnormal   Collection Time: 08/05/17  3:09 PM  Result Value Ref Range   Sodium 140 135 - 145 mmol/L   Potassium 4.2 3.5 - 5.1 mmol/L   Chloride 104 101 - 111 mmol/L   CO2 29 22 - 32 mmol/L   Glucose, Bld 108 (H) 65 - 99 mg/dL   BUN 23 (H) 6 - 20 mg/dL   Creatinine, Ser 1.08 0.61 - 1.24 mg/dL   Calcium 9.4 8.9 - 10.3 mg/dL   Total Protein 7.8 6.5 - 8.1 g/dL   Albumin 3.8 3.5 - 5.0 g/dL   AST 16 15 - 41 U/L   ALT 18 17 - 63 U/L   Alkaline Phosphatase 69 38 - 126 U/L   Total Bilirubin 0.4 0.3 - 1.2 mg/dL   GFR calc non Af Amer >60 >60 mL/min   GFR calc Af Amer >60 >60 mL/min    Comment: (NOTE) The eGFR has been calculated using the CKD EPI equation. This calculation has not been validated in all clinical situations. eGFR's persistently <60 mL/min signify possible Chronic Kidney Disease.    Anion gap 7 5 - 15  CBC with Differential/Platelet     Status: Abnormal   Collection Time: 08/05/17  3:09 PM  Result Value Ref Range   WBC 8.8 3.8 - 10.6 K/uL   RBC 4.25 (L) 4.40 - 5.90 MIL/uL   Hemoglobin 13.8 13.0 - 18.0 g/dL   HCT 40.1 40.0 - 52.0 %   MCV 94.2 80.0 - 100.0 fL   MCH 32.5 26.0 - 34.0 pg   MCHC 34.5 32.0 - 36.0 g/dL   RDW 13.0 11.5 - 14.5 %   Platelets 281 150 - 440 K/uL   Neutrophils Relative % 47 %   Neutro Abs 4.2 1.4 - 6.5 K/uL   Lymphocytes Relative 39 %   Lymphs Abs 3.5 1.0 - 3.6 K/uL   Monocytes Relative 10 %   Monocytes Absolute 0.9 0.2 - 1.0 K/uL   Eosinophils Relative 3 %   Eosinophils Absolute 0.2 0 - 0.7 K/uL   Basophils Relative 1 %   Basophils Absolute 0.1 0 - 0.1 K/uL  POCT HgB A1C     Status: Abnormal   Collection Time: 08/27/17  8:37 AM  Result Value Ref Range   Hemoglobin A1C 6.7   Lipid panel      Status: Abnormal   Collection Time: 08/27/17  9:10 AM  Result Value Ref Range   Cholesterol 172 <200 mg/dL   HDL 43 >40 mg/dL   Triglycerides 157 (H) <150 mg/dL   LDL Cholesterol (Calc) 103 (H) mg/dL (calc)    Comment: Reference range: <100 . Desirable range <100 mg/dL for primary prevention;   <70 mg/dL for patients with CHD or diabetic patients  with > or = 2 CHD risk factors. Marland Kitchen LDL-C is now calculated using the [name]-Hopkins  calculation, which is a validated novel method providing  better accuracy than the Friedewald equation in the  estimation of LDL-C.  Cresenciano Genre et al. Annamaria Helling. 8110;315(94): 2061-2068  (http://education.[facility].com/faq/FAQ164)    Total CHOL/HDL Ratio 4.0 <5.0 (calc)   Non-HDL Cholesterol (Calc) 129 <130 mg/dL (calc)    Comment: For patients with diabetes plus 1 major ASCVD risk  factor,  treating to a non-HDL-C goal of <100 mg/dL  (LDL-C of <70 mg/dL) is considered a therapeutic  option.   Urine Microalbumin w/creat. ratio     Status: None   Collection Time: 08/27/17 11:12 AM  Result Value Ref Range   Creatinine, Urine 224 20 - 320 mg/dL   Microalb, Ur 2.6 mg/dL    Comment: Reference Range Not established    Microalb Creat Ratio 12 <30 mcg/mg creat    Comment: . The ADA defines abnormalities in albumin excretion as follows: Marland Kitchen Category         Result (mcg/mg creatinine) . Normal                    <30 Microalbuminuria         30-299  Clinical albuminuria   > OR = 300 . The ADA recommends that at least two of three specimens collected within a 3-6 month period be abnormal before considering a patient to be within a diagnostic category.     Radiology No results found.    Assessment/Plan  Hypertension blood pressure control important in reducing the progression of atherosclerotic disease. On appropriate oral medications.   DM (diabetes mellitus), type 2 with peripheral vascular complications (HCC) blood glucose control  important in reducing the progression of atherosclerotic disease. Also, involved in wound healing. On appropriate medications.   Hyperlipidemia lipid control important in reducing the progression of atherosclerotic disease. Continue statin therapy   Cellulitis of lower leg Improved. His noninvasive studies today demonstrate a Baker's cyst on the left knee with some deep venous reflux in the right femoral and popliteal veins in the left common femoral vein.  No DVT or superficial thrombophlebitis was seen.  No superficial venous reflux was seen. No role for venous intervention is necessary at this point.  Recommend compression stockings, leg elevation, and increasing activity.  Return to clinic on an as-needed basis.    Leotis Pain, MD  09/15/2017 5:22 PM    This note was created with Dragon medical transcription system.  Any errors from dictation are purely unintentional

## 2017-09-15 NOTE — Progress Notes (Signed)
I was supposed to see the patient after his pelvis USG to ascertain the inguinal adenopathy. He has not had the USG done yet. I will therefore reschedule his appointment after the uSG. This patient was not seen by me today  Dr. Owens Shark, MD, MPH [facility] at [facility] Pager- [phone] 09/15/2017 9:09 AM

## 2017-09-15 NOTE — Assessment & Plan Note (Signed)
blood glucose control important in reducing the progression of atherosclerotic disease. Also, involved in wound healing. On appropriate medications.  

## 2017-09-15 NOTE — Assessment & Plan Note (Signed)
lipid control important in reducing the progression of atherosclerotic disease. Continue statin therapy  

## 2017-09-15 NOTE — Assessment & Plan Note (Signed)
Improved. His noninvasive studies today demonstrate a Baker's cyst on the left knee with some deep venous reflux in the right femoral and popliteal veins in the left common femoral vein.  No DVT or superficial thrombophlebitis was seen.  No superficial venous reflux was seen. No role for venous intervention is necessary at this point.  Recommend compression stockings, leg elevation, and increasing activity.  Return to clinic on an as-needed basis.

## 2017-09-17 ENCOUNTER — Ambulatory Visit
Admission: RE | Admit: 2017-09-17 | Discharge: 2017-09-17 | Disposition: A | Discharge status: Home / Self Care | Payer: BLUE CROSS/BLUE SHIELD | Source: Ambulatory Visit | Attending: Oncology | Admitting: Oncology

## 2017-09-17 DIAGNOSIS — M79662 Pain in left lower leg: Secondary | ICD-10-CM | POA: Diagnosis not present

## 2017-09-17 DIAGNOSIS — R590 Localized enlarged lymph nodes: Secondary | ICD-10-CM | POA: Diagnosis not present

## 2017-09-21 NOTE — Progress Notes (Signed)
Hematology/Oncology Consult note [facility]  Telephone:([phone] [phone]  Patient Care Team: Roselee Nova, MD as PCP - General (Family Medicine)   Name of the patient: Donald [name]  [id number]  March 12, 1966   Date of visit: 09/21/17  Diagnosis- inguinal adenopathy likely reactive  Chief complaint/ Reason for visit- discuss usg results  Heme/Onc history: 1. Patient is a 51 year old male who initially presented with symptoms of left ankle maintenance and underwent an ultrasound of the left leg which did not show any evidence of leukemia. 0.3 cm popliteal fossa cyst was noted and a mildly prominent left inguinal lymph node noted.  2. He underwent ultrasound of left lower extremity which showed:IMPRESSION: Left inguinal adenopathy with the largest measuring 2.7 x 0.7 x 2.0 cm. No cystic fluid collections noted. No hernia.  3. He has been referred to [place] for the same. Reports doing well overall. He did have LLE pain swelling and possible cellulitis 4 weeks back and was treated with po antibiotics for 2 weeks. Reports pain and swelling has improved significantly. Reports night sweats on and off for 3 years now. Reports 10 pound weight loss over 6 months but on todays exam he has gained weight  Interval history- he is doing well. Denies any pain or swelling in his leg  ECOG PS- 0 Pain scale- 0   Review of systems- Review of Systems  Constitutional: Negative for chills, fever, malaise/fatigue and weight loss.  HENT: Negative for congestion, ear discharge and nosebleeds.   Eyes: Negative for blurred vision.  Respiratory: Negative for cough, hemoptysis, sputum production, shortness of breath and wheezing.   Cardiovascular: Negative for chest pain, palpitations, orthopnea and claudication.  Gastrointestinal: Negative for abdominal pain, blood in stool, constipation, diarrhea, heartburn, melena, nausea and vomiting.  Genitourinary: Negative  for dysuria, flank pain, frequency, hematuria and urgency.  Musculoskeletal: Negative for back pain, joint pain and myalgias.  Skin: Negative for rash.  Neurological: Negative for dizziness, tingling, focal weakness, seizures, weakness and headaches.  Endo/Heme/Allergies: Does not bruise/bleed easily.  Psychiatric/Behavioral: Negative for depression and suicidal ideas. The patient does not have insomnia.      Allergies  Allergen Reactions  . No Known Allergies      Past Medical History:  Diagnosis Date  . Diabetes mellitus without complication ([place])   . Gout    R foot great toe   . Hypercholesteremia 2017  . Hypertension      Past Surgical History:  Procedure Laterality Date  . KNEE SURGERY Right     Social History   Social History  . Marital status: Married    Spouse name: N/A  . Number of children: N/A  . Years of education: N/A   Occupational History  . Not on file.   Social History Main Topics  . Smoking status: Current Every Day Smoker    Packs/day: 1.00    Years: 30.00    Types: Cigarettes  . Smokeless tobacco: Never Used  . Alcohol use Yes     Comment: no etoh x 1 month .previously social drinker  . Drug use: No  . Sexual activity: Yes    Partners: Female   Other Topics Concern  . Not on file   Social History Narrative  . No narrative on file    Family History  Problem Relation Age of Onset  . Hypertension Father   . Diabetes Father   . Liver disease Father   . Diverticulitis Mother   .  Migraines Mother   . Cancer Sister        tumors in the breast   . Diabetes Maternal Grandfather   . Cancer Sister        tumors in the breast   . Cancer Sister        tumor in the breast      Current Outpatient Prescriptions:  .  aspirin EC 81 MG EC tablet, Take 1 tablet (81 mg total) by mouth daily., Disp: , Rfl:  .  Blood Glucose Monitoring Suppl (D-CARE GLUCOMETER) w/Device KIT, 1 Units by Does not apply route 4 (four) times daily -  before meals  and at bedtime., Disp: 1 kit, Rfl: 0 .  fluocinonide-emollient (LIDEX-E) 0.05 % cream, Apply 1 application topically 2 (two) times daily., Disp: 60 g, Rfl: 0 .  Insulin Detemir (LEVEMIR FLEXTOUCH) 100 UNIT/ML Pen, Inject 10 Units into the skin daily at 10 pm., Disp: 3 pen, Rfl: 2 .  lisinopril (PRINIVIL,ZESTRIL) 5 MG tablet, Take 1 tablet (5 mg total) by mouth daily., Disp: 90 tablet, Rfl: 0 .  meloxicam (MOBIC) 7.5 MG tablet, TAKE 1 TABLET BY MOUTH EVERY DAY, Disp: 30 tablet, Rfl: 1 .  metFORMIN (GLUCOPHAGE) 1000 MG tablet, Take 1 tablet (1,000 mg total) by mouth 2 (two) times daily with a meal., Disp: 180 tablet, Rfl: 0 .  simvastatin (ZOCOR) 10 MG tablet, Take 1 tablet (10 mg total) by mouth daily., Disp: 90 tablet, Rfl: 0 .  Vitamin D, Ergocalciferol, (DRISDOL) 50000 units CAPS capsule, Take 1 capsule (50,000 Units total) by mouth once a week. For 12 weeks (Patient not taking: Reported on 08/11/2017), Disp: 12 capsule, Rfl: 0  Physical exam:  Vitals:   09/22/17 1108  BP: 136/82  Pulse: 85  Resp: 14  Temp: (!) 96.6 F (35.9 C)  TempSrc: Tympanic  Weight: 238 lb (108 kg)   Physical Exam  Constitutional: He is oriented to person, place, and time and well-developed, well-nourished, and in no distress.  HENT:  Head: Normocephalic and atraumatic.  Eyes: Pupils are equal, round, and reactive to light. EOM are normal.  Neck: Normal range of motion.  Cardiovascular: Normal rate, regular rhythm and normal heart sounds.   Pulmonary/Chest: Effort normal and breath sounds normal.  Abdominal: Soft. Bowel sounds are normal.  Lymphadenopathy:  No palpable inguinal adenopathy  Neurological: He is alert and oriented to person, place, and time.  Skin: Skin is warm and dry.     CMP Latest Ref Rng & Units 08/05/2017  Glucose 65 - 99 mg/dL 108(H)  BUN 6 - 20 mg/dL 23(H)  Creatinine 0.61 - 1.24 mg/dL 1.08  Sodium 135 - 145 mmol/L 140  Potassium 3.5 - 5.1 mmol/L 4.2  Chloride 101 - 111 mmol/L 104    CO2 22 - 32 mmol/L 29  Calcium 8.9 - 10.3 mg/dL 9.4  Total Protein 6.5 - 8.1 g/dL 7.8  Total Bilirubin 0.3 - 1.2 mg/dL 0.4  Alkaline Phos 38 - 126 U/L 69  AST 15 - 41 U/L 16  ALT 17 - 63 U/L 18   CBC Latest Ref Rng & Units 08/05/2017  WBC 3.8 - 10.6 K/uL 8.8  Hemoglobin 13.0 - 18.0 g/dL 13.8  Hematocrit 40.0 - 52.0 % 40.1  Platelets 150 - 440 K/uL 281    No images are attached to the encounter.  [place] Lt Lower Extrem Ltd Soft Tissue Non Vascular  Result Date: 09/17/2017 CLINICAL DATA:  51 year old male with inguinal lymphadenopathy. Subsequent encounter. EXAM: ULTRASOUND  LEFT LOWER EXTREMITY LIMITED TECHNIQUE: Ultrasound examination of the lower extremity soft tissues was performed in the area of clinical concern. COMPARISON:  07/17/2017, left lower extremity venous Doppler ultrasound 06/25/2017. FINDINGS: Ultrasound imaging in the left groin region. Multiple left inguinal lymph nodes are identified measuring up to 7 mm 9 mm short axis, and all demonstrate prominent fatty hila (normal architecture, image 6). Comparison images were also obtained of the right groin demonstrating similar lymph nodes and lymph node architecture (image 44). IMPRESSION: Negative for pathologic lymphadenopathy. Multiple subcentimeter short axis left inguinal lymph nodes are stable since August and appear normal/physiologic. Similar size and physiologic appearance of right inguinal lymph nodes documented today. Electronically Signed   By: Genevie Ann M.D.   On: 09/17/2017 14:12     Assessment and plan- Patient is a 51 y.o. male referred for left inguinal adenopathy likely reactive  Repeat ultrasound from 09/17/2017 shows multiple sub-centimeter left inguinal lymph 9 mm in shor and oriented to months. Prominent fatty hila. These have been stable since adenopathy.I have discussed the results with the patient and given that there is no evidence of pathologic adenopathy, he does not require any further workup such as lymph  node biopsy at this time. He can continue to follow up with his primary care doctor and can be referred in the future if questions or concerns arise   Visit Diagnosis 1. Inguinal adenopathy      Dr. Randa Evens, MD, MPH [facility] at [facility] Pager- [phone] 09/22/2017 1:13 PM

## 2017-09-22 ENCOUNTER — Inpatient Hospital Stay (HOSPITAL_BASED_OUTPATIENT_CLINIC_OR_DEPARTMENT_OTHER): Payer: BLUE CROSS/BLUE SHIELD | Admitting: Oncology

## 2017-09-22 VITALS — BP 136/82 | HR 85 | Temp 96.6°F | Resp 14 | Wt 238.0 lb

## 2017-09-22 DIAGNOSIS — I10 Essential (primary) hypertension: Secondary | ICD-10-CM | POA: Diagnosis not present

## 2017-09-22 DIAGNOSIS — R590 Localized enlarged lymph nodes: Secondary | ICD-10-CM

## 2017-09-22 DIAGNOSIS — E119 Type 2 diabetes mellitus without complications: Secondary | ICD-10-CM | POA: Diagnosis not present

## 2017-09-22 DIAGNOSIS — Z794 Long term (current) use of insulin: Secondary | ICD-10-CM | POA: Diagnosis not present

## 2017-09-22 DIAGNOSIS — M109 Gout, unspecified: Secondary | ICD-10-CM | POA: Diagnosis not present

## 2017-09-22 DIAGNOSIS — E7800 Pure hypercholesterolemia, unspecified: Secondary | ICD-10-CM | POA: Diagnosis not present

## 2017-09-22 DIAGNOSIS — Z7982 Long term (current) use of aspirin: Secondary | ICD-10-CM | POA: Diagnosis not present

## 2017-09-22 DIAGNOSIS — Z79899 Other long term (current) drug therapy: Secondary | ICD-10-CM | POA: Diagnosis not present

## 2017-09-22 DIAGNOSIS — F17210 Nicotine dependence, cigarettes, uncomplicated: Secondary | ICD-10-CM

## 2017-09-22 NOTE — Progress Notes (Signed)
Patient here today for follow up with ultrasound results. He states that he is feeling well today and denies having any pain.

## 2017-09-24 ENCOUNTER — Other Ambulatory Visit: Payer: Self-pay | Admitting: Family Medicine

## 2017-09-24 DIAGNOSIS — E1100 Type 2 diabetes mellitus with hyperosmolarity without nonketotic hyperglycemic-hyperosmolar coma (NKHHC): Secondary | ICD-10-CM

## 2017-11-27 ENCOUNTER — Ambulatory Visit (INDEPENDENT_AMBULATORY_CARE_PROVIDER_SITE_OTHER): Payer: BLUE CROSS/BLUE SHIELD | Admitting: Family Medicine

## 2017-11-27 ENCOUNTER — Encounter: Payer: Self-pay | Admitting: Family Medicine

## 2017-11-27 VITALS — BP 158/78 | HR 89 | Temp 98.4°F | Resp 16 | Wt 237.4 lb

## 2017-11-27 DIAGNOSIS — I10 Essential (primary) hypertension: Secondary | ICD-10-CM | POA: Diagnosis not present

## 2017-11-27 DIAGNOSIS — E7800 Pure hypercholesterolemia, unspecified: Secondary | ICD-10-CM

## 2017-11-27 DIAGNOSIS — Z23 Encounter for immunization: Secondary | ICD-10-CM | POA: Diagnosis not present

## 2017-11-27 DIAGNOSIS — E1151 Type 2 diabetes mellitus with diabetic peripheral angiopathy without gangrene: Secondary | ICD-10-CM

## 2017-11-27 LAB — POCT GLYCOSYLATED HEMOGLOBIN (HGB A1C): HEMOGLOBIN A1C: 6.8

## 2017-11-27 LAB — LIPID PANEL
Cholesterol: 177 mg/dL (ref <200)
HDL: 53 mg/dL (ref >40)
LDL Cholesterol (Calc): 104 mg/dL (calc) — ABNORMAL HIGH
NON-HDL CHOLESTEROL (CALC): 124 mg/dL (ref <130)
Total CHOL/HDL Ratio: 3.3 (calc) (ref <5.0)
Triglycerides: 103 mg/dL (ref <150)

## 2017-11-27 LAB — GLUCOSE, POCT (MANUAL RESULT ENTRY): POC Glucose: 125 mg/dl — AB (ref 70–99)

## 2017-11-27 MED ORDER — METFORMIN HCL 1000 MG PO TABS: 1000.0000 mg | ORAL_TABLET | Freq: Two times a day (BID) | ORAL | 0 refills | Status: DC

## 2017-11-27 MED ORDER — INSULIN DETEMIR 100 UNIT/ML FLEXPEN: 10.0000 [IU] | PEN_INJECTOR | Freq: Every day | SUBCUTANEOUS | 2 refills | Status: DC

## 2017-11-27 MED ORDER — SIMVASTATIN 10 MG PO TABS: 10.0000 mg | ORAL_TABLET | Freq: Every day | ORAL | 0 refills | Status: DC

## 2017-11-27 MED ORDER — LISINOPRIL 5 MG PO TABS: 5.0000 mg | ORAL_TABLET | Freq: Every day | ORAL | 0 refills | Status: DC

## 2017-11-27 NOTE — Progress Notes (Signed)
Name: Donald [name]   MRN: [id number]    DOB: 1966/02/06   Date:11/27/2017       Progress Note  Subjective  Chief Complaint  Chief Complaint  Patient presents with  . Hyperlipidemia    f/u  . Diabetes    f/u  . Hypertension    f/u  . Medication Refill    all maintence meds  . Immunizations    ppsv 23    Hyperlipidemia  This is a chronic problem. The problem is uncontrolled. Recent lipid tests were reviewed and are high (elevated Triglycerides and LDL cholesterol). Pertinent negatives include no chest pain, leg pain, myalgias or shortness of breath. Current antihyperlipidemic treatment includes statins. Risk factors for coronary artery disease include diabetes mellitus, dyslipidemia and male sex.  Diabetes  He presents for his follow-up diabetic visit. He has type 2 diabetes mellitus. His disease course has been stable. Pertinent negatives for hypoglycemia include no headaches. Pertinent negatives for diabetes include no blurred vision, no chest pain, no fatigue, no foot paresthesias, no polydipsia and no polyuria. Diabetic complications include PVD. Current diabetic treatment includes intensive insulin program and oral agent (monotherapy). He is following a generally healthy diet. He monitors blood glucose at home 1-2 x per week. His breakfast blood glucose range is generally 130-140 mg/dl. An ACE inhibitor/angiotensin II receptor blocker is being taken. Eye exam is not current.  Hypertension  This is a chronic problem. The problem is unchanged. The problem is uncontrolled. Pertinent negatives include no blurred vision, chest pain, headaches, palpitations or shortness of breath. Past treatments include ACE inhibitors. The current treatment provides significant (has not taken BP medication today) improvement. Hypertensive end-organ damage includes PVD.    Past Medical History:  Diagnosis Date  . Diabetes mellitus without complication ([place])   . Gout    R foot great toe   .  Hypercholesteremia 2017  . Hypertension     Past Surgical History:  Procedure Laterality Date  . KNEE SURGERY Right     Family History  Problem Relation Age of Onset  . Hypertension Father   . Diabetes Father   . Liver disease Father   . Diverticulitis Mother   . Migraines Mother   . Cancer Sister        tumors in the breast   . Diabetes Maternal Grandfather   . Cancer Sister        tumors in the breast   . Cancer Sister        tumor in the breast     Social History   Socioeconomic History  . Marital status: Married    Spouse name: Not on file  . Number of children: Not on file  . Years of education: Not on file  . Highest education level: Not on file  Social Needs  . Financial resource strain: Not on file  . Food insecurity - worry: Not on file  . Food insecurity - inability: Not on file  . Transportation needs - medical: Not on file  . Transportation needs - non-medical: Not on file  Occupational History  . Not on file  Tobacco Use  . Smoking status: Current Every Day Smoker    Packs/day: 1.00    Years: 30.00    Pack years: 30.00    Types: Cigarettes  . Smokeless tobacco: Never Used  Substance and Sexual Activity  . Alcohol use: Yes    Comment: no etoh x 1 month .previously social drinker  . Drug use: No  .  Sexual activity: Yes    Partners: Female  Other Topics Concern  . Not on file  Social History Narrative  . Not on file     Current Outpatient Medications:  .  aspirin EC 81 MG EC tablet, Take 1 tablet (81 mg total) by mouth daily., Disp: , Rfl:  .  Blood Glucose Monitoring Suppl (D-CARE GLUCOMETER) w/Device KIT, 1 Units by Does not apply route 4 (four) times daily -  before meals and at bedtime., Disp: 1 kit, Rfl: 0 .  fluocinonide-emollient (LIDEX-E) 0.05 % cream, Apply 1 application topically 2 (two) times daily., Disp: 60 g, Rfl: 0 .  Insulin Detemir (LEVEMIR FLEXTOUCH) 100 UNIT/ML Pen, Inject 10 Units into the skin daily at 10 pm., Disp: 3  pen, Rfl: 2 .  lisinopril (PRINIVIL,ZESTRIL) 5 MG tablet, Take 1 tablet (5 mg total) by mouth daily., Disp: 90 tablet, Rfl: 0 .  meloxicam (MOBIC) 7.5 MG tablet, TAKE 1 TABLET BY MOUTH EVERY DAY, Disp: 30 tablet, Rfl: 1 .  metFORMIN (GLUCOPHAGE) 1000 MG tablet, Take 1 tablet (1,000 mg total) by mouth 2 (two) times daily with a meal., Disp: 180 tablet, Rfl: 0 .  simvastatin (ZOCOR) 10 MG tablet, Take 1 tablet (10 mg total) by mouth daily., Disp: 90 tablet, Rfl: 0  Allergies  Allergen Reactions  . No Known Allergies      Review of Systems  Constitutional: Negative for fatigue.  Eyes: Negative for blurred vision.  Respiratory: Negative for shortness of breath.   Cardiovascular: Negative for chest pain and palpitations.  Musculoskeletal: Negative for myalgias.  Neurological: Negative for headaches.  Endo/Heme/Allergies: Negative for polydipsia.    Objective  Vitals:   11/27/17 0958  BP: (!) 158/78  Pulse: 89  Resp: 16  Temp: 98.4 F (36.9 C)  TempSrc: Oral  SpO2: 92%  Weight: 237 lb 6.4 oz (107.7 kg)    Physical Exam  Constitutional: He is oriented to person, place, and time and well-developed, well-nourished, and in no distress.  HENT:  Head: Normocephalic and atraumatic.  Cardiovascular: Normal rate, regular rhythm and normal heart sounds.  No murmur heard. Pulmonary/Chest: Effort normal and breath sounds normal. He has no wheezes.  Abdominal: Soft. Bowel sounds are normal.  Musculoskeletal: He exhibits no edema.  Neurological: He is alert and oriented to person, place, and time.  Psychiatric: Mood, memory, affect and judgment normal.  Nursing note and vitals reviewed.       Assessment & Plan  1. DM (diabetes mellitus), type 2 with peripheral vascular complications (HCC) J8A of 6.8%, well-controlled diabetes, no change in pharmacotherapy - POCT HgB A1C - POCT Glucose (CBG) - Pneumococcal polysaccharide vaccine 23-valent greater than or equal to 2yo  subcutaneous/IM - Insulin Detemir (LEVEMIR FLEXTOUCH) 100 UNIT/ML Pen; Inject 10 Units into the skin daily at 10 pm.  Dispense: 3 pen; Refill: 2 - metFORMIN (GLUCOPHAGE) 1000 MG tablet; Take 1 tablet (1,000 mg total) by mouth 2 (two) times daily with a meal.  Dispense: 180 tablet; Refill: 0  2. Need for 23-polyvalent pneumococcal polysaccharide vaccine  - Pneumococcal polysaccharide vaccine 23-valent greater than or equal to 2yo subcutaneous/IM  3. Essential hypertension Or pressure is elevated, patient has not taken his medication yet, also fasting which could raise his blood pressure, advised to check blood pressure periodically and will reassess in 3 months - lisinopril (PRINIVIL,ZESTRIL) 5 MG tablet; Take 1 tablet (5 mg total) by mouth daily.  Dispense: 90 tablet; Refill: 0  4. Pure hypercholesterolemia  - Lipid  panel - simvastatin (ZOCOR) 10 MG tablet; Take 1 tablet (10 mg total) by mouth daily.  Dispense: 90 tablet; Refill: 0   Dashanique Brownstein Asad A[facility] 11/27/2017 10:08 AM

## 2017-12-03 ENCOUNTER — Other Ambulatory Visit: Payer: Self-pay

## 2017-12-03 DIAGNOSIS — E7800 Pure hypercholesterolemia, unspecified: Secondary | ICD-10-CM

## 2017-12-03 MED ORDER — SIMVASTATIN 20 MG PO TABS: 20.0000 mg | ORAL_TABLET | Freq: Every day | ORAL | 0 refills | Status: DC

## 2017-12-03 MED ORDER — SIMVASTATIN 20 MG PO TABS: 10.0000 mg | ORAL_TABLET | Freq: Every day | ORAL | 0 refills | Status: DC

## 2017-12-03 NOTE — Addendum Note (Signed)
Addended bySherryll Burger: Brooklynne Pereida ASAD A on: 12/03/2017 11:12 AM   Modules accepted: Orders

## 2017-12-15 ENCOUNTER — Other Ambulatory Visit: Payer: Self-pay | Admitting: Family Medicine

## 2018-02-15 ENCOUNTER — Other Ambulatory Visit: Payer: Self-pay

## 2018-02-15 DIAGNOSIS — I10 Essential (primary) hypertension: Secondary | ICD-10-CM

## 2018-02-15 MED ORDER — MELOXICAM 7.5 MG PO TABS: 7.5000 mg | ORAL_TABLET | Freq: Every day | ORAL | 0 refills | Status: DC | PRN

## 2018-02-15 MED ORDER — LISINOPRIL 5 MG PO TABS: 5.0000 mg | ORAL_TABLET | Freq: Every day | ORAL | 0 refills | Status: DC

## 2018-02-15 NOTE — Telephone Encounter (Signed)
Last Cr, K+, H/H reviewed Appt already scheduled Rxs approved

## 2018-02-23 ENCOUNTER — Other Ambulatory Visit: Payer: Self-pay

## 2018-02-23 DIAGNOSIS — E1151 Type 2 diabetes mellitus with diabetic peripheral angiopathy without gangrene: Secondary | ICD-10-CM

## 2018-02-23 MED ORDER — METFORMIN HCL 1000 MG PO TABS: 1000.0000 mg | ORAL_TABLET | Freq: Two times a day (BID) | ORAL | 0 refills | Status: DC

## 2018-02-23 NOTE — Telephone Encounter (Signed)
Last Cr reviewed; Rx approved 

## 2018-02-23 NOTE — Telephone Encounter (Signed)
90 day supply

## 2018-02-26 ENCOUNTER — Encounter: Payer: Self-pay | Admitting: Family Medicine

## 2018-02-26 ENCOUNTER — Ambulatory Visit (INDEPENDENT_AMBULATORY_CARE_PROVIDER_SITE_OTHER): Payer: BLUE CROSS/BLUE SHIELD | Admitting: Family Medicine

## 2018-02-26 DIAGNOSIS — E7800 Pure hypercholesterolemia, unspecified: Secondary | ICD-10-CM

## 2018-02-26 DIAGNOSIS — E1151 Type 2 diabetes mellitus with diabetic peripheral angiopathy without gangrene: Secondary | ICD-10-CM | POA: Diagnosis not present

## 2018-02-26 DIAGNOSIS — I10 Essential (primary) hypertension: Secondary | ICD-10-CM | POA: Diagnosis not present

## 2018-02-26 DIAGNOSIS — Z5181 Encounter for therapeutic drug level monitoring: Secondary | ICD-10-CM

## 2018-02-26 MED ORDER — LISINOPRIL 10 MG PO TABS: 10.0000 mg | ORAL_TABLET | Freq: Every day | ORAL | 3 refills | Status: DC

## 2018-02-26 NOTE — Patient Instructions (Addendum)
If you need something for aches or pains, try to use Tylenol (acetaminophen) first instead of non-steroidals (which include Aleve, ibuprofen, Advil, Motrin, naproxen over-the-counter and meloxicam prescription); non-steroidals can cause long-term kidney damage and raise your blood pressure  Increase the lisinopril to 10 mg daily We'll have you see the eye doctor  Check out the information at familydoctor.org entitled "Nutrition for Weight Loss: What You Need to Know about Fad Diets" Try to lose between 1-2 pounds per week by taking in fewer calories and burning off more calories You can succeed by limiting portions, limiting foods dense in calories and fat, becoming more active, and drinking 8 glasses of water a day (64 ounces) Don't skip meals, especially breakfast, as skipping meals may alter your metabolism Do not use over-the-counter weight loss pills or gimmicks that claim rapid weight loss A healthy BMI (or body mass index) is between 18.5 and 24.9 You can calculate your ideal BMI at the NIH website JobE[employer]ics.hu   Obesity, Adult Obesity is the condition of having too much total body fat. Being overweight or obese means that your weight is greater than what is considered healthy for your body size. Obesity is determined by a measurement called BMI. BMI is an estimate of body fat and is calculated from height and weight. For adults, a BMI of 30 or higher is considered obese. Obesity can eventually lead to other health concerns and major illnesses, including:  Stroke.  Coronary artery disease (CAD).  Type 2 diabetes.  Some types of cancer, including cancers of the colon, breast, uterus, and gallbladder.  Osteoarthritis.  High blood pressure (hypertension).  High cholesterol.  Sleep apnea.  Gallbladder stones.  Infertility problems.  What are the causes? The main cause of obesity is taking in (consuming) more calories  than your body uses for energy. Other factors that contribute to this condition may include:  Being born with genes that make you more likely to become obese.  Having a medical condition that causes obesity. These conditions include: ? Hypothyroidism. ? Polycystic ovarian syndrome (PCOS). ? Binge-eating disorder. ? Cushing syndrome.  Taking certain medicines, such as steroids, antidepressants, and seizure medicines.  Not being physically active (sedentary lifestyle).  Living where there are limited places to exercise safely or buy healthy foods.  Not getting enough sleep.  What increases the risk? The following factors may increase your risk of this condition:  Having a family history of obesity.  Being a woman of African-American descent.  Being a man of Hispanic descent.  What are the signs or symptoms? Having excessive body fat is the main symptom of this condition. How is this diagnosed? This condition may be diagnosed based on:  Your symptoms.  Your medical history.  A physical exam. Your health care provider may measure: ? Your BMI. If you are an adult with a BMI between 25 and less than 30, you are considered overweight. If you are an adult with a BMI of 30 or higher, you are considered obese. ? The distances around your hips and your waist (circumferences). These may be compared to each other to help diagnose your condition. ? Your skinfold thickness. Your health care provider may gently pinch a fold of your skin and measure it.  How is this treated? Treatment for this condition often includes changing your lifestyle. Treatment may include some or all of the following:  Dietary changes. Work with your health care provider and a dietitian to set a weight-loss goal that is healthy and  reasonable for you. Dietary changes may include eating: ? Smaller portions. A portion size is the amount of a particular food that is healthy for you to eat at one time. This varies  from person to person. ? Low-calorie or low-fat options. ? More whole grains, fruits, and vegetables.  Regular physical activity. This may include aerobic activity (cardio) and strength training.  Medicine to help you lose weight. Your health care provider may prescribe medicine if you are unable to lose 1 pound a week after 6 weeks of eating more healthily and doing more physical activity.  Surgery. Surgical options may include gastric banding and gastric bypass. Surgery may be done if: ? Other treatments have not helped to improve your condition. ? You have a BMI of 40 or higher. ? You have life-threatening health problems related to obesity.  Follow these instructions at home:  Eating and drinking   Follow recommendations from your health care provider about what you eat and drink. Your health care provider may advise you to: ? Limit fast foods, sweets, and processed snack foods. ? Choose low-fat options, such as low-fat milk instead of whole milk. ? Eat 5 or more servings of fruits or vegetables every day. ? Eat at home more often. This gives you more control over what you eat. ? Choose healthy foods when you eat out. ? Learn what a healthy portion size is. ? Keep low-fat snacks on hand. ? Avoid sugary drinks, such as soda, fruit juice, iced tea sweetened with sugar, and flavored milk. ? Eat a healthy breakfast.  Drink enough water to keep your urine clear or pale yellow.  Do not go without eating for long periods of time (do not fast) or follow a fad diet. Fasting and fad diets can be unhealthy and even dangerous. Physical Activity  Exercise regularly, as told by your health care provider. Ask your health care provider what types of exercise are safe for you and how often you should exercise.  Warm up and stretch before being active.  Cool down and stretch after being active.  Rest between periods of activity. Lifestyle  Limit the time that you spend in front of your  TV, computer, or video game system.  Find ways to reward yourself that do not involve food.  Limit alcohol intake to no more than 1 drink a day for nonpregnant women and 2 drinks a day for men. One drink equals 12 oz of beer, 5 oz of wine, or 1 oz of hard liquor. General instructions  Keep a weight loss journal to keep track of the food you eat and how much you exercise you get.  Take over-the-counter and prescription medicines only as told by your health care provider.  Take vitamins and supplements only as told by your health care provider.  Consider joining a support group. Your health care provider may be able to recommend a support group.  Keep all follow-up visits as told by your health care provider. This is important. Contact a health care provider if:  You are unable to meet your weight loss goal after 6 weeks of dietary and lifestyle changes. This information is not intended to replace advice given to you by your health care provider. Make sure you discuss any questions you have with your health care provider. Document Released: 12/25/2004 Document Revised: 04/21/2016 Document Reviewed: 09/05/2015 Elsevier Interactive Patient Education  2018 Elsevier Inc.  DASH Eating Plan DASH stands for "Dietary Approaches to Stop Hypertension." The DASH eating plan  is a healthy eating plan that has been shown to reduce high blood pressure (hypertension). It may also reduce your risk for type 2 diabetes, heart disease, and stroke. The DASH eating plan may also help with weight loss. What are tips for following this plan? General guidelines  Avoid eating more than 2,300 mg (milligrams) of salt (sodium) a day. If you have hypertension, you may need to reduce your sodium intake to 1,500 mg a day.  Limit alcohol intake to no more than 1 drink a day for nonpregnant women and 2 drinks a day for men. One drink equals 12 oz of beer, 5 oz of wine, or 1 oz of hard liquor.  Work with your health  care provider to maintain a healthy body weight or to lose weight. Ask what an ideal weight is for you.  Get at least 30 minutes of exercise that causes your heart to beat faster (aerobic exercise) most days of the week. Activities may include walking, swimming, or biking.  Work with your health care provider or diet and nutrition specialist (dietitian) to adjust your eating plan to your individual calorie needs. Reading food labels  Check food labels for the amount of sodium per serving. Choose foods with less than 5 percent of the Daily Value of sodium. Generally, foods with less than 300 mg of sodium per serving fit into this eating plan.  To find whole grains, look for the word "whole" as the first word in the ingredient list. Shopping  Buy products labeled as "low-sodium" or "no salt added."  Buy fresh foods. Avoid canned foods and premade or frozen meals. Cooking  Avoid adding salt when cooking. Use salt-free seasonings or herbs instead of table salt or sea salt. Check with your health care provider or pharmacist before using salt substitutes.  Do not fry foods. Cook foods using healthy methods such as baking, boiling, grilling, and broiling instead.  Cook with heart-healthy oils, such as olive, canola, soybean, or sunflower oil. Meal planning   Eat a balanced diet that includes: ? 5 or more servings of fruits and vegetables each day. At each meal, try to fill half of your plate with fruits and vegetables. ? Up to 6-8 servings of whole grains each day. ? Less than 6 oz of lean meat, poultry, or fish each day. A 3-oz serving of meat is about the same size as a deck of cards. One egg equals 1 oz. ? 2 servings of low-fat dairy each day. ? A serving of nuts, seeds, or beans 5 times each week. ? Heart-healthy fats. Healthy fats called Omega-3 fatty acids are found in foods such as flaxseeds and coldwater fish, like sardines, salmon, and mackerel.  Limit how much you eat of the  following: ? Canned or prepackaged foods. ? Food that is high in trans fat, such as fried foods. ? Food that is high in saturated fat, such as fatty meat. ? Sweets, desserts, sugary drinks, and other foods with added sugar. ? Full-fat dairy products.  Do not salt foods before eating.  Try to eat at least 2 vegetarian meals each week.  Eat more home-cooked food and less restaurant, buffet, and fast food.  When eating at a restaurant, ask that your food be prepared with less salt or no salt, if possible. What foods are recommended? The items listed may not be a complete list. Talk with your dietitian about what dietary choices are best for you. Grains Whole-grain or whole-wheat bread. Whole-grain or whole-wheat  pasta. Brown rice. Orpah Cobbatmeal. Quinoa. Bulgur. Whole-grain and low-sodium cereals. Pita bread. Low-fat, low-sodium crackers. Whole-wheat flour tortillas. Vegetables Fresh or frozen vegetables (raw, steamed, roasted, or grilled). Low-sodium or reduced-sodium tomato and vegetable juice. Low-sodium or reduced-sodium tomato sauce and tomato paste. Low-sodium or reduced-sodium canned vegetables. Fruits All fresh, dried, or frozen fruit. Canned fruit in natural juice (without added sugar). Meat and other protein foods Skinless chicken or Malawiturkey. Ground chicken or [place]urkey. Pork with fat trimmed off. Fish and seafood. Egg whites. Dried beans, peas, or lentils. Unsalted nuts, nut butters, and seeds. Unsalted canned beans. Lean cuts of beef with fat trimmed off. Low-sodium, lean deli meat. Dairy Low-fat (1%) or fat-free (skim) milk. Fat-free, low-fat, or reduced-fat cheeses. Nonfat, low-sodium ricotta or cottage cheese. Low-fat or nonfat yogurt. Low-fat, low-sodium cheese. Fats and oils Soft margarine without trans fats. Vegetable oil. Low-fat, reduced-fat, or light mayonnaise and salad dressings (reduced-sodium). Canola, safflower, olive, soybean, and sunflower oils. Avocado. Seasoning and other  foods Herbs. Spices. Seasoning mixes without salt. Unsalted popcorn and pretzels. Fat-free sweets. What foods are not recommended? The items listed may not be a complete list. Talk with your dietitian about what dietary choices are best for you. Grains Baked goods made with fat, such as croissants, muffins, or some breads. Dry pasta or rice meal packs. Vegetables Creamed or fried vegetables. Vegetables in a cheese sauce. Regular canned vegetables (not low-sodium or reduced-sodium). Regular canned tomato sauce and paste (not low-sodium or reduced-sodium). Regular tomato and vegetable juice (not low-sodium or reduced-sodium). Rosita FirePickles. Olives. Fruits Canned fruit in a light or heavy syrup. Fried fruit. Fruit in cream or butter sauce. Meat and other protein foods Fatty cuts of meat. Ribs. Fried meat. Tomasa BlaseBacon. Sausage. Bologna and other processed lunch meats. Salami. Fatback. Hotdogs. Bratwurst. Salted nuts and seeds. Canned beans with added salt. Canned or smoked fish. Whole eggs or egg yolks. Chicken or Malaw[place]urkey with skin. Dairy Whole or 2% milk, cream, and half-and-half. Whole or full-fat cream cheese. Whole-fat or sweetened yogurt. Full-fat cheese. Nondairy creamers. Whipped toppings. Processed cheese and cheese spreads. Fats and oils Butter. Stick margarine. Lard. Shortening. Ghee. Bacon fat. Tropical oils, such as coconut, palm kernel, or palm oil. Seasoning and other foods Salted popcorn and pretzels. Onion salt, garlic salt, seasoned salt, table salt, and sea salt. Worcestershire sauce. Tartar sauce. Barbecue sauce. Teriyaki sauce. Soy sauce, including reduced-sodium. Steak sauce. Canned and packaged gravies. Fish sauce. Oyster sauce. Cocktail sauce. Horseradish that you find on the shelf. Ketchup. Mustard. Meat flavorings and tenderizers. Bouillon cubes. Hot sauce and Tabasco sauce. Premade or packaged marinades. Premade or packaged taco seasonings. Relishes. Regular salad dressings. Where to find  more information:  National Heart, Lung, and Blood Institute: PopSteam.iswww.nhlbi.nih.gov  American Heart Association: www.heart.org Summary  The DASH eating plan is a healthy eating plan that has been shown to reduce high blood pressure (hypertension). It may also reduce your risk for type 2 diabetes, heart disease, and stroke.  With the DASH eating plan, you should limit salt (sodium) intake to 2,300 mg a day. If you have hypertension, you may need to reduce your sodium intake to 1,500 mg a day.  When on the DASH eating plan, aim to eat more fresh fruits and vegetables, whole grains, lean proteins, low-fat dairy, and heart-healthy fats.  Work with your health care provider or diet and nutrition specialist (dietitian) to adjust your eating plan to your individual calorie needs. This information is not intended to replace advice given to you by  your health care provider. Make sure you discuss any questions you have with your health care provider. Document Released: 11/06/2011 Document Revised: 11/10/2016 Document Reviewed: 11/10/2016 Elsevier Interactive Patient Education  [employer].

## 2018-02-26 NOTE — Assessment & Plan Note (Signed)
Check lipids today; continue statin, working on weight loss; staying active at work

## 2018-02-26 NOTE — Assessment & Plan Note (Signed)
Refer to eye doctor; foot exam by MD today; check A1c; urine in Sept 2018 normal; next due in Sept 2019

## 2018-02-26 NOTE — Progress Notes (Signed)
BP (!) 148/76   Pulse 99   Temp 98.6 F (37 C) (Oral)   Resp 16   Ht 6' (1.829 m)   Wt 229 lb (103.9 kg)   SpO2 95%   BMI 31.06 kg/m    Subjective:    Patient ID: Donald [name], male    DOB: [birth year], 52 y.o.   MRN: [id number]  HPI: Donald [name] is a 52 y.o. male  Chief Complaint  Patient presents with  . Follow-up    HPI  Patient is new to me; previous provider left our practice  Type 2 diabetes; taking metformin and insulin Checking sugars every day; over the last two weeks, highest glucose was about 140 postprandial; lowest in the last two weeks 108 No dry mouth or blurred vision  Last urine microalbumin:Cr was normal in Sept 2018 Lab Results  Component Value Date   HGBA1C 6.8 11/27/2017  on ACE-I for renal protection  High cholesterol On statin; trying to limit fatty meats; no myalgias Lab Results  Component Value Date   CHOL 177 11/27/2017   HDL 53 11/27/2017   LDLCALC 104 (H) 11/27/2017   TRIG 103 11/27/2017   CHOLHDL 3.3 11/27/2017   Gout; controlled now; probably what he was eating; not strong fam hx  On meloxicam; just found out that he is not supposed to take this daily; he will now take it only as needed  Obesity; fluctuates; goes up and comes down; does lots of walking with work; down 9 pounds   Depression screen [facility] 2/9 02/26/2018 11/27/2017 08/27/2017 08/05/2017 07/24/2017  Decreased Interest 0 0 0 0 0  Down, Depressed, Hopeless 0 0 0 0 0  PHQ - 2 Score 0 0 0 0 0    Relevant past medical, surgical, family and social history reviewed Past Medical History:  Diagnosis Date  . Diabetes mellitus without complication (HCC)   . Gout    R foot great toe   . Hypercholesteremia 2017  . Hypertension    Past Surgical History:  Procedure Laterality Date  . KNEE SURGERY Right    Family History  Problem Relation Age of Onset  . Hypertension Father   . Diabetes Father   . Liver disease Father   . Diverticulitis Mother   . Migraines Mother     . Cancer Sister        tumors in the breast   . Diabetes Maternal Grandfather   . Cancer Sister        tumors in the breast   . Cancer Sister        tumor in the breast    Social History   Tobacco Use  . Smoking status: Current Every Day Smoker    Packs/day: 1.00    Years: 30.00    Pack years: 30.00    Types: Cigarettes  . Smokeless tobacco: Never Used  Substance Use Topics  . Alcohol use: Yes    Comment: no etoh x 1 month .previously social drinker  . Drug use: No    Interim medical history since last visit reviewed. Allergies and medications reviewed  Review of Systems Per HPI unless specifically indicated above     Objective:    BP (!) 148/76   Pulse 99   Temp 98.6 F (37 C) (Oral)   Resp 16   Ht 6' (1.829 m)   Wt 229 lb (103.9 kg)   SpO2 95%   BMI 31.06 kg/m   Wt Readings from Last 3  Encounters:  02/26/18 229 lb (103.9 kg)  11/27/17 237 lb 6.4 oz (107.7 kg)  09/22/17 238 lb (108 kg)    Physical Exam  Constitutional: He appears well-developed and well-nourished. No distress.  Weight down 9 pounds over last 5 months  HENT:  Head: Normocephalic and atraumatic.  Eyes: EOM are normal. No scleral icterus.  Neck: No thyromegaly present.  Cardiovascular: Normal rate and regular rhythm.  Pulmonary/Chest: Effort normal and breath sounds normal.  Abdominal: Soft. Bowel sounds are normal. He exhibits no distension.  Musculoskeletal: He exhibits no edema.  Neurological: He is alert.  Skin: Skin is warm and dry. No pallor.  Psychiatric: He has a normal mood and affect. His behavior is normal. Judgment and thought content normal.   Diabetic Foot Form - Detailed   Diabetic Foot Exam - detailed Diabetic Foot exam was performed with the following findings:  Yes 02/26/2018 10:11 AM  Visual Foot Exam completed.:  Yes  Pulse Foot Exam completed.:  Yes  Right Dorsalis Pedis:  Present Left Dorsalis Pedis:  Present  Sensory Foot Exam Completed.:  Yes Semmes-Weinstein  Monofilament Test R Site 1-Great Toe:  Pos L Site 1-Great Toe:  Pos        Results for orders placed or performed in visit on 11/27/17  Lipid panel  Result Value Ref Range   Cholesterol 177 <200 mg/dL   HDL 53 >16 mg/dL   Triglycerides 109 <604 mg/dL   LDL Cholesterol (Calc) 104 (H) mg/dL (calc)   Total CHOL/HDL Ratio 3.3 <5.0 (calc)   Non-HDL Cholesterol (Calc) 124 <130 mg/dL (calc)  POCT HgB V4U  Result Value Ref Range   Hemoglobin A1C 6.8   POCT Glucose (CBG)  Result Value Ref Range   POC Glucose 125 (A) 70 - 99 mg/dl      Assessment & Plan:   Problem List Items Addressed This Visit      Cardiovascular and Mediastinum   Hypertension    Increase ACE-I, try to lose 10 pounds over 3-4 months, try DASH guidelines      Relevant Medications   lisinopril (PRINIVIL,ZESTRIL) 10 MG tablet   DM (diabetes mellitus), type 2 with peripheral vascular complications (HCC)    Refer to eye doctor; foot exam by MD today; check A1c; urine in Sept 2018 normal; next due in Sept 2019      Relevant Medications   lisinopril (PRINIVIL,ZESTRIL) 10 MG tablet   Other Relevant Orders   Ambulatory referral to Ophthalmology   Lipid panel   Hemoglobin A1c     Other   Medication monitoring encounter    Check liver and kidneys      Relevant Orders   COMPLETE METABOLIC PANEL WITH GFR   Hyperlipidemia    Check lipids today; continue statin, working on weight loss; staying active at work      Relevant Medications   lisinopril (PRINIVIL,ZESTRIL) 10 MG tablet   Other Relevant Orders   Lipid panel       Follow up plan: Return in about 2 weeks (around 03/12/2018) for blood pressure recheck with CMA with labs (nonfasting).  An after-visit summary was printed and given to the patient at check-out.  Please see the patient instructions which may contain other information and recommendations beyond what is mentioned above in the assessment and plan.  Meds ordered this encounter  Medications    . lisinopril (PRINIVIL,ZESTRIL) 10 MG tablet    Sig: Take 1 tablet (10 mg total) by mouth daily.    Dispense:  90 tablet    Refill:  3    Increasing the dose; cancel any 5 mg pills left    Orders Placed This Encounter  Procedures  . Lipid panel  . Hemoglobin A1c  . COMPLETE METABOLIC PANEL WITH GFR  . Ambulatory referral to Ophthalmology

## 2018-02-26 NOTE — Assessment & Plan Note (Signed)
Check liver and kidneys 

## 2018-02-26 NOTE — Assessment & Plan Note (Signed)
Increase ACE-I, try to lose 10 pounds over 3-4 months, try DASH guidelines

## 2018-03-12 ENCOUNTER — Ambulatory Visit (INDEPENDENT_AMBULATORY_CARE_PROVIDER_SITE_OTHER): Payer: BLUE CROSS/BLUE SHIELD

## 2018-03-12 VITALS — BP 140/76 | Ht 72.0 in | Wt 229.0 lb

## 2018-03-12 DIAGNOSIS — I10 Essential (primary) hypertension: Secondary | ICD-10-CM

## 2018-03-12 NOTE — Progress Notes (Signed)
Patient is here for a blood pressure check. Patient denies chest pain, palpitations, shortness of breath or visual disturbances. At previous visit blood pressure was 148/76 with a heart rate of 99. Today during nurse visit first check blood pressure was 142/78 with a heart rate of 98. After resting for 10 minutes it was 140 /70 and heart rate was 76. He does take Lisinopril 10 mg as prescribed. Patient reports that it took him 4 days to get his prescription fill. He has been on the increased dose for less than two weeks. He has some stress at his job that may cause his bp to be elevated.

## 2018-03-17 ENCOUNTER — Other Ambulatory Visit: Payer: Self-pay | Admitting: Family Medicine

## 2018-03-17 NOTE — Telephone Encounter (Signed)
Labs are pending I'll be more liberal with refills once those are resulted

## 2018-04-28 ENCOUNTER — Ambulatory Visit: Payer: Self-pay

## 2018-04-28 DIAGNOSIS — R12 Heartburn: Secondary | ICD-10-CM | POA: Diagnosis not present

## 2018-04-28 DIAGNOSIS — R1013 Epigastric pain: Secondary | ICD-10-CM | POA: Diagnosis not present

## 2018-04-28 NOTE — Telephone Encounter (Signed)
Patient called in with c/o "upper abdominal pain." He says "anytime I eat something for the past 2 weeks, I have pain right below my chest in the upper part of my stomach. I went to a urgent care this morning and have 2 prescriptions to pick up. Right now I have no pain. I do have a sour taste in my mouth sometimes." I asked what medications were prescribed, he says "Dicyclomine and Omeprazole." I asked is the pain constant and the severity, he says "it was constant, but now my abdomen is more sore than anything, moderate." I asked about other symptoms, he denies. According to protocol, see PCP within 24 hours, no availability with PCP, appointment scheduled for tomorrow at 1120 with Dr. Carlynn Purl, care advice given, patient verbalized understanding.  Reason for Disposition . [1] MODERATE pain (e.g., interferes with normal activities) AND [2] pain comes and goes (cramps) AND [3] present > 24 hours  (Exception: pain with Vomiting or Diarrhea - see that Guideline)  Answer Assessment - Initial Assessment Questions 1. LOCATION: "Where does it hurt?"      Right below my chest cavity, upper abdomen 2. RADIATION: "Does the pain shoot anywhere else?" (e.g., chest, back)     No 3. ONSET: "When did the pain begin?" (Minutes, hours or days ago)      2 weeks ago 4. SUDDEN: "Gradual or sudden onset?"     Sudden 5. PATTERN "Does the pain come and go, or is it constant?"    - If constant: "Is it getting better, staying the same, or worsening?"      (Note: Constant means the pain never goes away completely; most serious pain is constant and it progresses)     - If intermittent: "How long does it last?" "Do you have pain now?"     (Note: Intermittent means the pain goes away completely between bouts)     Constant 6. SEVERITY: "How bad is the pain?"  (e.g., Scale 1-10; mild, moderate, or severe)    - MILD (1-3): doesn't interfere with normal activities, abdomen soft and not tender to touch     - MODERATE (4-7):  interferes with normal activities or awakens from sleep, tender to touch     - SEVERE (8-10): excruciating pain, doubled over, unable to do any normal activities       Moderate 7. RECURRENT SYMPTOM: "Have you ever had this type of abdominal pain before?" If so, ask: "When was the last time?" and "What happened that time?"      No 8. CAUSE: "What do you think is causing the abdominal pain?"     Possibly acid reflux 9. RELIEVING/AGGRAVATING FACTORS: "What makes it better or worse?" (e.g., movement, antacids, bowel movement)     Worse is when eating; nothing made it better 10. OTHER SYMPTOMS: "Has there been any vomiting, diarrhea, constipation, or urine problems?"       No  Protocols used: ABDOMINAL PAIN - MALE-A-AH

## 2018-04-29 ENCOUNTER — Encounter: Payer: Self-pay | Admitting: Family Medicine

## 2018-04-29 ENCOUNTER — Ambulatory Visit (INDEPENDENT_AMBULATORY_CARE_PROVIDER_SITE_OTHER): Payer: BLUE CROSS/BLUE SHIELD | Admitting: Family Medicine

## 2018-04-29 VITALS — BP 140/90 | HR 113 | Resp 16 | Ht 72.0 in | Wt 222.5 lb

## 2018-04-29 DIAGNOSIS — R1319 Other dysphagia: Secondary | ICD-10-CM

## 2018-04-29 DIAGNOSIS — E7800 Pure hypercholesterolemia, unspecified: Secondary | ICD-10-CM | POA: Diagnosis not present

## 2018-04-29 DIAGNOSIS — R1012 Left upper quadrant pain: Secondary | ICD-10-CM | POA: Diagnosis not present

## 2018-04-29 DIAGNOSIS — R1013 Epigastric pain: Secondary | ICD-10-CM | POA: Diagnosis not present

## 2018-04-29 DIAGNOSIS — E1151 Type 2 diabetes mellitus with diabetic peripheral angiopathy without gangrene: Secondary | ICD-10-CM | POA: Diagnosis not present

## 2018-04-29 DIAGNOSIS — R1310 Dysphagia, unspecified: Secondary | ICD-10-CM | POA: Diagnosis not present

## 2018-04-29 DIAGNOSIS — Z5181 Encounter for therapeutic drug level monitoring: Secondary | ICD-10-CM | POA: Diagnosis not present

## 2018-04-29 DIAGNOSIS — F1010 Alcohol abuse, uncomplicated: Secondary | ICD-10-CM

## 2018-04-29 NOTE — Progress Notes (Signed)
Name: Donald [name]   MRN: [id number]    DOB: 09-Jan-1966   Date:04/29/2018       Progress Note  Subjective  Chief Complaint  Chief Complaint  Patient presents with  . Abdominal Pain    He has been having intermittent abdominal pain x 2 weeks. Pain is slightly improving. Food is getting lodge in his throat when he eats. He was evaluated at [facility] yesterday and treated with Dicyclomine and Omeprazole. He is unable to eat. Pain radiates from his abdomen to his back. He has extremely hotter than normal working conditions and he is unable to tolerate heat while feeling like he does. He had to leave work early on Friday because he was unable to tolerate the 140 degree heat.    HPI  Dyspepsia and dysphagia: he states symptoms started 2 weeks ago, the day after he ate shrimp. He noticed dysphagia also indigestion, no nausea or vomiting and no diarrhea. He states in the beginning the pain was so intense that he could only drink fluids , even a tums got stuck in his throat. He denies associated rash , pruritus also denies associated wheezing or SOB. He does not has a history of heart disease or GERD. He is getting better gradually but still has upper abdominal pain and pain that radiates to his back, pain is intense at times, constant and worse when he eats, sharp at times. He went to Urgent care and was given omeprazole and bentyl with some improvement of symptoms. He states labs were not done at Urgent care. No fever or chills. He worked this past weekend but had to leave early because pain after meals is so much worse. He drinks alcohol - liquor . He has noticed decrease in bowel movements, but able to pass gas ( states not eating much), he denies dysuria or hematuria    Office Visit from 04/29/2018 in [facility]  AUDIT-C Score  10     Patient Active Problem List   Diagnosis Date Noted  . Medication monitoring encounter 02/26/2018  . Hypertension 09/15/2017  . Ingrown toenail  07/06/2017  . Peripheral vascular insufficiency ([place]) 07/06/2017  . Need for hepatitis C screening test 03/27/2017  . Hyperlipidemia 10/31/2016  . DM (diabetes mellitus), type 2 with peripheral vascular complications ([place]) 05/69/7948    Past Surgical History:  Procedure Laterality Date  . KNEE SURGERY Right     Family History  Problem Relation Age of Onset  . Hypertension Father   . Diabetes Father   . Liver disease Father   . Diverticulitis Mother   . Migraines Mother   . Cancer Sister        tumors in the breast   . Diabetes Maternal Grandfather   . Cancer Sister        tumors in the breast   . Cancer Sister        tumor in the breast     Social History   Socioeconomic History  . Marital status: Married    Spouse name: Not on file  . Number of children: Not on file  . Years of education: Not on file  . Highest education level: Not on file  Occupational History  . Not on file  Social Needs  . Financial resource strain: Not on file  . Food insecurity:    Worry: Not on file    Inability: Not on file  . Transportation needs:    Medical: Not on file  Non-medical: Not on file  Tobacco Use  . Smoking status: Current Every Day Smoker    Packs/day: 1.00    Years: 30.00    Pack years: 30.00    Types: Cigarettes  . Smokeless tobacco: Never Used  Substance and Sexual Activity  . Alcohol use: Yes    Comment: no etoh x 1 month .previously social drinker  . Drug use: No  . Sexual activity: Yes    Partners: Female  Lifestyle  . Physical activity:    Days per week: Not on file    Minutes per session: Not on file  . Stress: Not on file  Relationships  . Social connections:    Talks on phone: Not on file    Gets together: Not on file    Attends religious service: Not on file    Active member of club or organization: Not on file    Attends meetings of clubs or organizations: Not on file    Relationship status: Not on file  . Intimate partner violence:    Fear  of current or ex partner: Not on file    Emotionally abused: Not on file    Physically abused: Not on file    Forced sexual activity: Not on file  Other Topics Concern  . Not on file  Social History Narrative  . Not on file     Current Outpatient Medications:  .  dicyclomine (BENTYL) 20 MG tablet, , Disp: , Rfl:  .  omeprazole (PRILOSEC) 20 MG capsule, , Disp: , Rfl:  .  aspirin EC 81 MG EC tablet, Take 1 tablet (81 mg total) by mouth daily., Disp: , Rfl:  .  BD PEN NEEDLE MICRO U/F 32G X 6 MM MISC, USE FOR LEVEMIR INJECTION DAILY, Disp: 100 each, Rfl: 0 .  Blood Glucose Monitoring Suppl (D-CARE GLUCOMETER) w/Device KIT, 1 Units by Does not apply route 4 (four) times daily -  before meals and at bedtime., Disp: 1 kit, Rfl: 0 .  Insulin Detemir (LEVEMIR FLEXTOUCH) 100 UNIT/ML Pen, Inject 10 Units into the skin daily at 10 pm. (Patient taking differently: Inject 15 Units into the skin daily at 10 pm. ), Disp: 3 pen, Rfl: 2 .  lisinopril (PRINIVIL,ZESTRIL) 10 MG tablet, Take 1 tablet (10 mg total) by mouth daily., Disp: 90 tablet, Rfl: 3 .  meloxicam (MOBIC) 7.5 MG tablet, TAKE 1 TABLET (7.5 MG TOTAL) BY MOUTH DAILY AS NEEDED FOR PAIN., Disp: 30 tablet, Rfl: 0 .  metFORMIN (GLUCOPHAGE) 1000 MG tablet, Take 1 tablet (1,000 mg total) by mouth 2 (two) times daily with a meal., Disp: 180 tablet, Rfl: 0 .  simvastatin (ZOCOR) 20 MG tablet, Take 1 tablet (20 mg total) by mouth at bedtime., Disp: 90 tablet, Rfl: 0  Allergies  Allergen Reactions  . No Known Allergies      ROS  Ten systems reviewed and is negative except as mentioned in HPI   Objective  Vitals:   04/29/18 1126  BP: 140/90  Pulse: (!) 113  Resp: 16  SpO2: 98%  Weight: 222 lb 8 oz (100.9 kg)  Height: 6' (1.829 m)    Body mass index is 30.18 kg/m.  Physical Exam  Constitutional: Patient appears well-developed and well-nourished. Obese  No distress.  HEENT: head atraumatic, normocephalic, pupils equal and reactive  to light, neck supple, throat within normal limits Cardiovascular: Normal rate, regular rhythm and normal heart sounds.  No murmur heard. No BLE edema. Pulmonary/Chest: Effort normal and breath  sounds normal. No respiratory distress. Abdominal: Soft.  There is epigastric and left upper quadrant pain  ( refused rectal)  Psychiatric: Patient has a normal mood and affect. behavior is normal. Judgment and thought content normal.  PHQ2/9: Depression screen [facility] 2/9 02/26/2018 11/27/2017 08/27/2017 08/05/2017 07/24/2017  Decreased Interest 0 0 0 0 0  Down, Depressed, Hopeless 0 0 0 0 0  PHQ - 2 Score 0 0 0 0 0     Fall Risk: Fall Risk  04/29/2018 02/26/2018 11/27/2017 08/27/2017 08/05/2017  Falls in the past year? No No No No No     Assessment & Plan  1. Esophageal dysphagia  - H. pylori breath test - CBC with Differential/Platelet - Lipase - Amylase  2. Dyspepsia  - H. pylori breath test - CBC with Differential/Platelet - Lipase - Amylase  3. Left upper quadrant pain  Explained that he may have pancreatitis, to have liquid diet and wait for labs , also advised to go to [facility] if worsening of symptoms, he also has pain on left side of back, differential diagnosis includes: pancreatitis, gastritis, PUD, less likely kidney stones  4. Alcohol abuse  Elevated AUDIT, discussed importance of quitting drinking for his health and discuss it with Dr. Sanda Klein

## 2018-04-30 LAB — CBC WITH DIFFERENTIAL/PLATELET
BASOS ABS: 67 {cells}/uL (ref 0–200)
Basophils Relative: 0.6 %
Eosinophils Absolute: 112 cells/uL (ref 15–500)
Eosinophils Relative: 1 %
HCT: 44.8 % (ref 38.5–50.0)
Hemoglobin: 15.5 g/dL (ref 13.2–17.1)
Lymphs Abs: 3528 cells/uL (ref 850–3900)
MCH: 31.8 pg (ref 27.0–33.0)
MCHC: 34.6 g/dL (ref 32.0–36.0)
MCV: 92 fL (ref 80.0–100.0)
MPV: 11.3 fL (ref 7.5–12.5)
Monocytes Relative: 7.8 %
NEUTROS PCT: 59.1 %
Neutro Abs: 6619 cells/uL (ref 1500–7800)
PLATELETS: 374 10*3/uL (ref 140–400)
RBC: 4.87 10*6/uL (ref 4.20–5.80)
RDW: 12.9 % (ref 11.0–15.0)
TOTAL LYMPHOCYTE: 31.5 %
WBC: 11.2 10*3/uL — AB (ref 3.8–10.8)
WBCMIX: 874 {cells}/uL (ref 200–950)

## 2018-04-30 LAB — COMPLETE METABOLIC PANEL WITH GFR
AG Ratio: 1.2 (calc) (ref 1.0–2.5)
ALKALINE PHOSPHATASE (APISO): 80 U/L (ref 40–115)
ALT: 12 U/L (ref 9–46)
AST: 11 U/L (ref 10–35)
Albumin: 4.4 g/dL (ref 3.6–5.1)
BUN: 18 mg/dL (ref 7–25)
CO2: 27 mmol/L (ref 20–32)
Calcium: 10.3 mg/dL (ref 8.6–10.3)
Chloride: 98 mmol/L (ref 98–110)
Creat: 1.01 mg/dL (ref 0.70–1.33)
GFR, Est African American: 98 mL/min/{1.73_m2} (ref >=60)
GFR, Est Non African American: 85 mL/min/{1.73_m2} (ref >=60)
GLUCOSE: 122 mg/dL (ref 65–139)
Globulin: 3.7 g/dL (calc) (ref 1.9–3.7)
Potassium: 5.3 mmol/L (ref 3.5–5.3)
Sodium: 137 mmol/L (ref 135–146)
Total Bilirubin: 0.3 mg/dL (ref 0.2–1.2)
Total Protein: 8.1 g/dL (ref 6.1–8.1)

## 2018-04-30 LAB — LIPID PANEL
CHOL/HDL RATIO: 5.5 (calc) — AB (ref <5.0)
Cholesterol: 218 mg/dL — ABNORMAL HIGH (ref <200)
HDL: 40 mg/dL — ABNORMAL LOW (ref >40)
LDL CHOLESTEROL (CALC): 141 mg/dL — AB
Non-HDL Cholesterol (Calc): 178 mg/dL (calc) — ABNORMAL HIGH (ref <130)
Triglycerides: 232 mg/dL — ABNORMAL HIGH (ref <150)

## 2018-04-30 LAB — HEMOGLOBIN A1C
HEMOGLOBIN A1C: 6.8 %{Hb} — AB (ref <5.7)
MEAN PLASMA GLUCOSE: 148 (calc)
eAG (mmol/L): 8.2 (calc)

## 2018-04-30 LAB — AMYLASE: AMYLASE: 50 U/L (ref 21–101)

## 2018-04-30 LAB — LIPASE: LIPASE: 54 U/L (ref 7–60)

## 2018-04-30 LAB — H. PYLORI BREATH TEST: H. pylori Breath Test: NOT DETECTED

## 2018-05-01 ENCOUNTER — Encounter: Payer: Self-pay | Admitting: Emergency Medicine

## 2018-05-01 ENCOUNTER — Emergency Department
Admission: EM | Admit: 2018-05-01 | Discharge: 2018-05-01 | Disposition: A | Discharge status: Home / Self Care | Payer: BLUE CROSS/BLUE SHIELD | Attending: Emergency Medicine | Admitting: Emergency Medicine

## 2018-05-01 ENCOUNTER — Other Ambulatory Visit: Payer: Self-pay

## 2018-05-01 DIAGNOSIS — E119 Type 2 diabetes mellitus without complications: Secondary | ICD-10-CM | POA: Diagnosis not present

## 2018-05-01 DIAGNOSIS — Z79899 Other long term (current) drug therapy: Secondary | ICD-10-CM | POA: Insufficient documentation

## 2018-05-01 DIAGNOSIS — F17210 Nicotine dependence, cigarettes, uncomplicated: Secondary | ICD-10-CM | POA: Insufficient documentation

## 2018-05-01 DIAGNOSIS — I10 Essential (primary) hypertension: Secondary | ICD-10-CM | POA: Diagnosis not present

## 2018-05-01 DIAGNOSIS — R109 Unspecified abdominal pain: Secondary | ICD-10-CM

## 2018-05-01 DIAGNOSIS — Z794 Long term (current) use of insulin: Secondary | ICD-10-CM | POA: Insufficient documentation

## 2018-05-01 DIAGNOSIS — Z7982 Long term (current) use of aspirin: Secondary | ICD-10-CM | POA: Insufficient documentation

## 2018-05-01 DIAGNOSIS — R1013 Epigastric pain: Secondary | ICD-10-CM | POA: Insufficient documentation

## 2018-05-01 DIAGNOSIS — R000 Tachycardia, unspecified: Secondary | ICD-10-CM | POA: Diagnosis not present

## 2018-05-01 DIAGNOSIS — R1012 Left upper quadrant pain: Secondary | ICD-10-CM | POA: Diagnosis not present

## 2018-05-01 LAB — COMPREHENSIVE METABOLIC PANEL
ALT: 14 U/L — ABNORMAL LOW (ref 17–63)
AST: 16 U/L (ref 15–41)
Albumin: 4 g/dL (ref 3.5–5.0)
Alkaline Phosphatase: 79 U/L (ref 38–126)
Anion gap: 11 (ref 5–15)
BILIRUBIN TOTAL: 0.4 mg/dL (ref 0.3–1.2)
BUN: 16 mg/dL (ref 6–20)
CO2: 25 mmol/L (ref 22–32)
CREATININE: 0.97 mg/dL (ref 0.61–1.24)
Calcium: 9.6 mg/dL (ref 8.9–10.3)
Chloride: 97 mmol/L — ABNORMAL LOW (ref 101–111)
GFR calc Af Amer: >60 mL/min (ref >60)
Glucose, Bld: 134 mg/dL — ABNORMAL HIGH (ref 65–99)
POTASSIUM: 4.3 mmol/L (ref 3.5–5.1)
Sodium: 133 mmol/L — ABNORMAL LOW (ref 135–145)
TOTAL PROTEIN: 8.8 g/dL — AB (ref 6.5–8.1)

## 2018-05-01 LAB — CBC
HEMATOCRIT: 43 % (ref 40.0–52.0)
Hemoglobin: 14.6 g/dL (ref 13.0–18.0)
MCH: 32.3 pg (ref 26.0–34.0)
MCHC: 34.1 g/dL (ref 32.0–36.0)
MCV: 94.7 fL (ref 80.0–100.0)
PLATELETS: 348 10*3/uL (ref 150–440)
RBC: 4.54 MIL/uL (ref 4.40–5.90)
RDW: 14.3 % (ref 11.5–14.5)
WBC: 15.1 10*3/uL — AB (ref 3.8–10.6)

## 2018-05-01 LAB — TROPONIN I: Troponin I: <0.03 ng/mL (ref <0.03)

## 2018-05-01 LAB — LIPASE, BLOOD: Lipase: 47 U/L (ref 11–51)

## 2018-05-01 MED ORDER — SUCRALFATE 1 G PO TABS: 1.0000 g | ORAL_TABLET | Freq: Four times a day (QID) | ORAL | 0 refills | Status: DC

## 2018-05-01 NOTE — ED Provider Notes (Addendum)
[facility] Emergency Department Provider Note   ____________________________________________   I have reviewed the triage vital signs and the nursing notes.   HISTORY  Chief Complaint Abdominal Pain   History limited by: Not Limited   HPI Donald [name] is a 52 y.o. male who presents to the emergency department today because of concerns for abdominal pain.  Is located in the epigastric and left upper quadrant.  It started 2 weeks ago.  It does somewhat wax and wane.  It will become severe.  He describes it as a sharp feeling.  It is slightly worse when he eats.  He states that when he eats he feels like his food is getting caught in his esophagus.  He has tried taking some over-the-counter medication and went to urgent care and got prescriptions.  He denies any significant relief from this.  He did see his doctor 2 days ago for this.    Per medical record review patient saw PCP 2 days ago for abdominal pain.  Past Medical History:  Diagnosis Date  . Diabetes mellitus without complication ([place])   . Gout    R foot great toe   . Hypercholesteremia 2017  . Hypertension     Patient Active Problem List   Diagnosis Date Noted  . Medication monitoring encounter 02/26/2018  . Hypertension 09/15/2017  . Ingrown toenail 07/06/2017  . Peripheral vascular insufficiency ([place]) 07/06/2017  . Need for hepatitis C screening test 03/27/2017  . Hyperlipidemia 10/31/2016  . DM (diabetes mellitus), type 2 with peripheral vascular complications ([place]) 02/09/8117    Past Surgical History:  Procedure Laterality Date  . KNEE SURGERY Right     Prior to Admission medications   Medication Sig Start Date End Date Taking? Authorizing Provider  aspirin EC 81 MG EC tablet Take 1 tablet (81 mg total) by mouth daily. 07/23/16   Hillary Bow, MD  BD PEN NEEDLE MICRO U/F 32G X 6 MM MISC USE FOR LEVEMIR INJECTION DAILY 12/15/17   Roselee Nova, MD  Blood Glucose Monitoring  Suppl (D-CARE GLUCOMETER) w/Device KIT 1 Units by Does not apply route 4 (four) times daily -  before meals and at bedtime. 07/23/16   Hillary Bow, MD  dicyclomine (BENTYL) 20 MG tablet  04/28/18   [provider]  Insulin Detemir (LEVEMIR FLEXTOUCH) 100 UNIT/ML Pen Inject 10 Units into the skin daily at 10 pm. Patient taking differently: Inject 15 Units into the skin daily at 10 pm.  11/27/17   Roselee Nova, MD  lisinopril (PRINIVIL,ZESTRIL) 10 MG tablet Take 1 tablet (10 mg total) by mouth daily. 02/26/18   Lada, Satira Anis, MD  meloxicam (MOBIC) 7.5 MG tablet TAKE 1 TABLET (7.5 MG TOTAL) BY MOUTH DAILY AS NEEDED FOR PAIN. 03/17/18   Arnetha Courser, MD  metFORMIN (GLUCOPHAGE) 1000 MG tablet Take 1 tablet (1,000 mg total) by mouth 2 (two) times daily with a meal. 02/23/18 05/24/18  Arnetha Courser, MD  omeprazole (PRILOSEC) 20 MG capsule  04/28/18   [provider]  simvastatin (ZOCOR) 20 MG tablet Take 1 tablet (20 mg total) by mouth at bedtime. 12/03/17   Roselee Nova, MD    Allergies No known allergies  Family History  Problem Relation Age of Onset  . Hypertension Father   . Diabetes Father   . Liver disease Father   . Diverticulitis Mother   . Migraines Mother   . Cancer Sister  tumors in the breast   . Diabetes Maternal Grandfather   . Cancer Sister        tumors in the breast   . Cancer Sister        tumor in the breast     Social History Social History   Tobacco Use  . Smoking status: Current Every Day Smoker    Packs/day: 1.00    Years: 30.00    Pack years: 30.00    Types: Cigarettes  . Smokeless tobacco: Never Used  Substance Use Topics  . Alcohol use: Yes    Alcohol/week: 14.4 oz    Types: 24 Shots of liquor per week  . Drug use: No    Review of Systems Constitutional: No fever/chills Eyes: No visual changes. ENT: No sore throat. Cardiovascular: Denies chest pain. Respiratory: Denies shortness of breath. Gastrointestinal:  Positive for abdominal pain. Decreased appetite.  Genitourinary: Negative for dysuria. Musculoskeletal: Negative for back pain. Skin: Negative for rash. Neurological: Negative for headaches, focal weakness or numbness.  ____________________________________________   PHYSICAL EXAM:  VITAL SIGNS: ED Triage Vitals  Enc Vitals Group     BP 05/01/18 1528 115/72     Pulse Rate 05/01/18 1528 (!) 110     Resp 05/01/18 1528 18     Temp 05/01/18 1528 98.7 F (37.1 C)     Temp Source 05/01/18 1528 Oral     SpO2 05/01/18 1528 98 %     Weight 05/01/18 1528 224 lb (101.6 kg)     Height 05/01/18 1528 5' 11"  (1.803 m)     Head Circumference --      Peak Flow --      Pain Score 05/01/18 1558 9   Constitutional: Alert and oriented.  Eyes: Conjunctivae are normal.  ENT      Head: Normocephalic and atraumatic.      Nose: No congestion/rhinnorhea.      Mouth/Throat: Mucous membranes are moist.      Neck: No stridor. Hematological/Lymphatic/Immunilogical: No cervical lymphadenopathy. Cardiovascular: Normal rate, regular rhythm.  No murmurs, rubs, or gallops.  Respiratory: Normal respiratory effort without tachypnea nor retractions. Breath sounds are clear and equal bilaterally. No wheezes/rales/rhonchi. Gastrointestinal: Soft and minimally tender in the left upper quadrant. No rebound. No guarding.  Genitourinary: Deferred Musculoskeletal: Normal range of motion in all extremities. No lower extremity edema. Neurologic:  Normal speech and language. No gross focal neurologic deficits are appreciated.  Skin:  Skin is warm, dry and intact. No rash noted. Psychiatric: Mood and affect are normal. Speech and behavior are normal. Patient exhibits appropriate insight and judgment.  ____________________________________________    LABS (pertinent positives/negatives)  Lipase 47 CMP na 133, k 4.3, cl 97, glu 134, cr 0.97 CBC wbc 15.1, hgb 14.6, plt 348 Trop  <0.03  ____________________________________________   EKG  I, Nance Pear, attending physician, personally viewed and interpreted this EKG  EKG Time: 1535 Rate: 102 Rhythm: sinus tachycardia Axis: normal Intervals: qtc 450 QRS: narrow ST changes: no st elevation Impression: abnormal ekg  ____________________________________________    RADIOLOGY  None  ____________________________________________   PROCEDURES  Procedures  ____________________________________________   INITIAL IMPRESSION / ASSESSMENT AND PLAN / ED COURSE  Pertinent labs & imaging results that were available during my care of the patient were reviewed by me and considered in my medical decision making (see chart for details).   Patient presents to the emergency department today because of concerns for abdominal pain.  Has been ongoing for 2 weeks.  Located  epigastric and left upper quadrant.  Differential would be broad including gastric ulcers, gastritis, pancreatitis, gallbladder disease, atresia amongst other etiologies.  Patient's blood work shows a very mild leukocytosis.  On exam he is very minimally tender in the left upper quadrant.  No right upper quadrant tenderness.  At this point I doubt gallstones.  Do not think any emergent imaging is necessary. Think he would most likely benefit from EGD. Discussed with patient importance of GI follow-up.    ____________________________________________   FINAL CLINICAL IMPRESSION(S) / ED DIAGNOSES  Final diagnoses:  Abdominal pain, unspecified abdominal location     Note: This dictation was prepared with Dragon dictation. Any transcriptional errors that result from this process are unintentional     Nance Pear, MD 05/01/18 Merrily Pew    Nance Pear, MD 05/01/18 [phone]

## 2018-05-01 NOTE — ED Notes (Addendum)
Pt states central abd pain x 2 weeks (from top to bottom). States worse after eating. Denies N&V&D. Still has appendix and gallbladder. Hx reflux, doesn't currently take any medicines for it. Alert, oriented. No distress noted. States he was seen at walk-in clinic and "they gave me something." states he saw his doctor and "he didn't give me nothing." pt states walk-in clinic gave him one medicine to take every 4 hours and another medicine he takes an hour before he eats. Unsure what medicines are called. States DM, CBG's have been good per pt. Denies weight gain recently.

## 2018-05-01 NOTE — Discharge Instructions (Addendum)
Please seek medical attention for any high fevers, chest pain, shortness of breath, change in behavior, persistent vomiting, bloody stool or any other new or concerning symptoms.  

## 2018-05-01 NOTE — ED Triage Notes (Signed)
Intermittent epigastric pain x 2 weeks.

## 2018-05-03 ENCOUNTER — Telehealth: Payer: Self-pay | Admitting: Gastroenterology

## 2018-05-03 NOTE — Telephone Encounter (Signed)
Left vm for pt to call office and schedule FU Apt with Dr. Allegra LaiVanga

## 2018-05-05 NOTE — Telephone Encounter (Signed)
Left another vm for pt to call office. Letter sent

## 2018-05-06 ENCOUNTER — Other Ambulatory Visit: Payer: Self-pay

## 2018-05-06 DIAGNOSIS — E7800 Pure hypercholesterolemia, unspecified: Secondary | ICD-10-CM

## 2018-05-07 MED ORDER — ROSUVASTATIN CALCIUM 20 MG PO TABS: 20.0000 mg | ORAL_TABLET | Freq: Every day | ORAL | 0 refills | Status: DC

## 2018-05-07 NOTE — Telephone Encounter (Signed)
Per the last lab results, Dr. Carlynn PurlSowles said to STOP simvastatin and start Crestor (rosuvastatin); new Rx sent today Let's recheck lipids (fasting) in 6-8 weeks; please ORDER

## 2018-05-11 ENCOUNTER — Ambulatory Visit
Admission: RE | Admit: 2018-05-11 | Discharge: 2018-05-11 | Disposition: A | Discharge status: Home / Self Care | Payer: BLUE CROSS/BLUE SHIELD | Source: Ambulatory Visit | Attending: Family Medicine | Admitting: Family Medicine

## 2018-05-11 ENCOUNTER — Encounter: Payer: Self-pay | Admitting: Family Medicine

## 2018-05-11 ENCOUNTER — Ambulatory Visit (INDEPENDENT_AMBULATORY_CARE_PROVIDER_SITE_OTHER): Payer: BLUE CROSS/BLUE SHIELD | Admitting: Family Medicine

## 2018-05-11 VITALS — BP 124/78 | HR 93 | Temp 98.2°F | Resp 14 | Ht 72.0 in | Wt 224.2 lb

## 2018-05-11 DIAGNOSIS — R1310 Dysphagia, unspecified: Secondary | ICD-10-CM

## 2018-05-11 DIAGNOSIS — F17200 Nicotine dependence, unspecified, uncomplicated: Secondary | ICD-10-CM

## 2018-05-11 DIAGNOSIS — D72829 Elevated white blood cell count, unspecified: Secondary | ICD-10-CM | POA: Insufficient documentation

## 2018-05-11 DIAGNOSIS — R079 Chest pain, unspecified: Secondary | ICD-10-CM | POA: Diagnosis not present

## 2018-05-11 DIAGNOSIS — E871 Hypo-osmolality and hyponatremia: Secondary | ICD-10-CM

## 2018-05-11 DIAGNOSIS — R779 Abnormality of plasma protein, unspecified: Secondary | ICD-10-CM | POA: Diagnosis not present

## 2018-05-11 DIAGNOSIS — E1151 Type 2 diabetes mellitus with diabetic peripheral angiopathy without gangrene: Secondary | ICD-10-CM | POA: Diagnosis not present

## 2018-05-11 DIAGNOSIS — R1011 Right upper quadrant pain: Secondary | ICD-10-CM | POA: Diagnosis not present

## 2018-05-11 LAB — COMPLETE METABOLIC PANEL WITH GFR
AG RATIO: 1.2 (calc) (ref 1.0–2.5)
ALT: 11 U/L (ref 9–46)
AST: 10 U/L (ref 10–35)
Albumin: 4 g/dL (ref 3.6–5.1)
Alkaline phosphatase (APISO): 82 U/L (ref 40–115)
BUN: 15 mg/dL (ref 7–25)
CALCIUM: 9.2 mg/dL (ref 8.6–10.3)
CHLORIDE: 105 mmol/L (ref 98–110)
CO2: 24 mmol/L (ref 20–32)
Creat: 0.78 mg/dL (ref 0.70–1.33)
GFR, EST NON AFRICAN AMERICAN: 103 mL/min/{1.73_m2} (ref >=60)
GFR, Est African American: 119 mL/min/{1.73_m2} (ref >=60)
GLUCOSE: 148 mg/dL — AB (ref 65–99)
Globulin: 3.3 g/dL (calc) (ref 1.9–3.7)
POTASSIUM: 4.4 mmol/L (ref 3.5–5.3)
Sodium: 137 mmol/L (ref 135–146)
Total Bilirubin: 0.2 mg/dL (ref 0.2–1.2)
Total Protein: 7.3 g/dL (ref 6.1–8.1)

## 2018-05-11 LAB — CBC WITH DIFFERENTIAL/PLATELET
Basophils Absolute: 71 cells/uL (ref 0–200)
Basophils Relative: 0.7 %
EOS PCT: 2.2 %
Eosinophils Absolute: 224 cells/uL (ref 15–500)
HCT: 37.7 % — ABNORMAL LOW (ref 38.5–50.0)
Hemoglobin: 13.4 g/dL (ref 13.2–17.1)
Lymphs Abs: 2887 cells/uL (ref 850–3900)
MCH: 32.1 pg (ref 27.0–33.0)
MCHC: 35.5 g/dL (ref 32.0–36.0)
MCV: 90.4 fL (ref 80.0–100.0)
MONOS PCT: 6.2 %
MPV: 11.6 fL (ref 7.5–12.5)
NEUTROS PCT: 62.6 %
Neutro Abs: 6385 cells/uL (ref 1500–7800)
Platelets: 350 10*3/uL (ref 140–400)
RBC: 4.17 10*6/uL — AB (ref 4.20–5.80)
RDW: 12.9 % (ref 11.0–15.0)
TOTAL LYMPHOCYTE: 28.3 %
WBC: 10.2 10*3/uL (ref 3.8–10.8)
WBCMIX: 632 {cells}/uL (ref 200–950)

## 2018-05-11 NOTE — Assessment & Plan Note (Signed)
Controlled; abrupt onset of symptoms not typical for gastroparesis; foot exam by MD today

## 2018-05-11 NOTE — Assessment & Plan Note (Signed)
Gave patient the 1-800-QUIT-NOW number and encouraged cessation

## 2018-05-11 NOTE — Progress Notes (Signed)
BP 124/78   Pulse 93   Temp 98.2 F (36.8 C) (Oral)   Resp 14   Ht 6' (1.829 m)   Wt 224 lb 3.2 oz (101.7 kg)   SpO2 97%   BMI 30.41 kg/m    Subjective:    Patient ID: Donald [name], male    DOB: 1966/06/11, 52 y.o.   MRN: [id number]  HPI: Donald [name] is a 52 y.o. male  Chief Complaint  Patient presents with  . Follow-up    HPI  He had a biscuit a few weeks ago; felt like it got stuck; had some discomfort; quit eating the biscuit, drank something to wash it down; had pasta and shrimp later, thinking that would go down, then got difficulty swallowing and thought he had heartburn, and thought he might allergic to shrimp; wife gave him some Tums; pill got stuck and he finally got it down; he used to have heartburn years ago, but no issues for 6-7 years; pain would be in the lower chest and upper abdomen; stayed in bed and took medicine; acid pills over-the-counter and pepto-bismol and kind of eased up; then went to work on a hot day, that Saturday evening, got dizzy and left work; he was cramping up in legs and arms and chest and had charley horse kind of knots; that's when he came in and saw Dr. Carlynn PurlSowles; has pain now about 1 hour later, not digesting, not moving  He went to the walk-in clinic and had pain around the back and abdomen; sore and couldn't eat much He was in the ER on 05/01/18, they did trop I which was negative; total protein was high; eating and drinking enough now; low sodium too; he just had an H pylori test which was negative  Diabetes mellitus; sugars have been well-controlled; no new problems with his feet  [facility] 05/11/2018 02/26/2018 11/27/2017 08/27/2017 08/05/2017  Decreased Interest 0 0 0 0 0  Down, Depressed, Hopeless 0 0 0 0 0  PHQ - 2 Score 0 0 0 0 0   Relevant past medical, surgical, family and social history reviewed Past Medical History:  Diagnosis Date  . Diabetes mellitus without complication (HCC)   . Gout    R foot great toe     . Hypercholesteremia 2017  . Hypertension    Past Surgical History:  Procedure Laterality Date  . KNEE SURGERY Right    Family History  Problem Relation Age of Onset  . Hypertension Father   . Diabetes Father   . Liver disease Father   . Diverticulitis Mother   . Migraines Mother   . Cancer Sister        tumors in the breast   . Diabetes Maternal Grandfather   . Cancer Sister        tumors in the breast   . Cancer Sister        tumor in the breast    Social History   Tobacco Use  . Smoking status: Current Every Day Smoker    Packs/day: 1.00    Years: 30.00    Pack years: 30.00    Types: Cigarettes  . Smokeless tobacco: Never Used  Substance Use Topics  . Alcohol use: Yes    Alcohol/week: 0.0 oz    Comment: none in the last 3 weeks  . Drug use: No  no alcohol in the last 3 weeks  Interim medical history since last visit reviewed. Allergies and medications reviewed  Review of Systems Per HPI unless specifically indicated above     Objective:    BP 124/78   Pulse 93   Temp 98.2 F (36.8 C) (Oral)   Resp 14   Ht 6' (1.829 m)   Wt 224 lb 3.2 oz (101.7 kg)   SpO2 97%   BMI 30.41 kg/m   Wt Readings from Last 3 Encounters:  05/11/18 224 lb 3.2 oz (101.7 kg)  05/01/18 224 lb (101.6 kg)  04/29/18 222 lb 8 oz (100.9 kg)    Physical Exam  Constitutional: He appears well-developed and well-nourished. No distress.  HENT:  Head: Normocephalic and atraumatic.  Eyes: EOM are normal. No scleral icterus.  Neck: No thyromegaly present.  Cardiovascular: Normal rate and regular rhythm.  Pulmonary/Chest: Effort normal and breath sounds normal.  Abdominal: Soft. Bowel sounds are normal. He exhibits no distension. There is hepatomegaly. There is tenderness in the right upper quadrant and epigastric area.  Musculoskeletal: He exhibits no edema.  Neurological: Coordination normal.  Skin: Skin is warm and dry. No pallor.  Psychiatric: He has a normal mood and affect.  His behavior is normal. Judgment and thought content normal.   Diabetic Foot Form - Detailed   Diabetic Foot Exam - detailed Diabetic Foot exam was performed with the following findings:  Yes 05/11/2018  9:20 AM  Visual Foot Exam completed.:  Yes  Pulse Foot Exam completed.:  Yes  Right Dorsalis Pedis:  Present Left Dorsalis Pedis:  Present  Sensory Foot Exam Completed.:  Yes Semmes-Weinstein Monofilament Test R Site 1-Great Toe:  Pos L Site 1-Great Toe:  Pos       Results for orders placed or performed during the hospital encounter of 05/01/18  Lipase, blood  Result Value Ref Range   Lipase 47 11 - 51 U/L  Comprehensive metabolic panel  Result Value Ref Range   Sodium 133 (L) 135 - 145 mmol/L   Potassium 4.3 3.5 - 5.1 mmol/L   Chloride 97 (L) 101 - 111 mmol/L   CO2 25 22 - 32 mmol/L   Glucose, Bld 134 (H) 65 - 99 mg/dL   BUN 16 6 - 20 mg/dL   Creatinine, Ser 1.61 0.61 - 1.24 mg/dL   Calcium 9.6 8.9 - 09.6 mg/dL   Total Protein 8.8 (H) 6.5 - 8.1 g/dL   Albumin 4.0 3.5 - 5.0 g/dL   AST 16 15 - 41 U/L   ALT 14 (L) 17 - 63 U/L   Alkaline Phosphatase 79 38 - 126 U/L   Total Bilirubin 0.4 0.3 - 1.2 mg/dL   GFR calc non Af Amer >60 >60 mL/min   GFR calc Af Amer >60 >60 mL/min   Anion gap 11 5 - 15  CBC  Result Value Ref Range   WBC 15.1 (H) 3.8 - 10.6 K/uL   RBC 4.54 4.40 - 5.90 MIL/uL   Hemoglobin 14.6 13.0 - 18.0 g/dL   HCT 04.5 40.9 - 81.1 %   MCV 94.7 80.0 - 100.0 fL   MCH 32.3 26.0 - 34.0 pg   MCHC 34.1 32.0 - 36.0 g/dL   RDW 91.4 78.2 - 95.6 %   Platelets 348 150 - 440 K/uL  Troponin I  Result Value Ref Range   Troponin I <0.03 <0.03 ng/mL      Assessment & Plan:   Problem List Items Addressed This Visit      Cardiovascular and Mediastinum   DM (diabetes mellitus), type 2 with peripheral vascular complications (HCC)  Controlled; abrupt onset of symptoms not typical for gastroparesis; foot exam by MD today        Other   Smoker    Gave patient the  1-800-QUIT-NOW number and encouraged cessation      Relevant Orders   DG Chest 2 View   Ambulatory referral to Gastroenterology    Other Visit Diagnoses    Odynophagia    -  Primary   refer to GI high priority for EGD, get CXR to r/o mass effect and abd [place] to evaluate for hepatomegaly; continue work-up, f/u in two weeks   Relevant Orders   DG Chest 2 View   Ambulatory referral to Gastroenterology   Leukocytosis, unspecified type       Relevant Orders   DG Chest 2 View   CBC with Differential/Platelet   US Abdomen Complete   RUQ pain       H pylori breath test was negative; will get [place], then CT scan if needed   Relevant Orders   COMPLETE METABOLIC PANEL WITH GFR   US Abdomen Complete   Hyponatremia       will get a chest xray; elevated protein plus low Na+ concerning for possible malignancy; start with chest xray and abd [place] and refer to GI for EGD   High serum protein level       start work-up; level concerning for malignancy; start with CXR, referral for EGD, abd [place] and then proceed to CT if needed based on initial findings       Follow up plan: Return in about 2 weeks (around 05/25/2018) for follow-up visit with Dr. Sherie Don.  An after-visit summary was printed and given to the patient at check-out.  Please see the patient instructions which may contain other information and recommendations beyond what is mentioned above in the assessment and plan.  No orders of the defined types were placed in this encounter.   Orders Placed This Encounter  Procedures  . DG Chest 2 View  . US Abdomen Complete  . CBC with Differential/Platelet  . COMPLETE METABOLIC PANEL WITH GFR  . Ambulatory referral to Gastroenterology

## 2018-05-11 NOTE — Patient Instructions (Addendum)
Let's get labs today Then please go across the street for the chest xray  Please call [phone] to schedule your imaging test (ultrasound) Please wait 2-3 days after the order has been placed to call and get your test scheduled  I do encourage you to quit smoking Call [phone] to sign up for smoking cessation classes You can call 1-800-QUIT-NOW to talk with a smoking cessation coach We'll have you see the gastroenterologist right away Eat soft foods, avoiding anything that might get stuck Go right to the emergency room if you develop worsening symptoms  Check out the information at familydoctor.org entitled "Nutrition for Weight Loss: What You Need to Know about Fad Diets" Try to lose between 1-2 pounds per week by taking in fewer calories and burning off more calories You can succeed by limiting portions, limiting foods dense in calories and fat, becoming more active, and drinking 8 glasses of water a day (64 ounces) Don't skip meals, especially breakfast, as skipping meals may alter your metabolism Do not use over-the-counter weight loss pills or gimmicks that claim rapid weight loss A healthy BMI (or body mass index) is between 18.5 and 24.9 You can calculate your ideal BMI at the NIH website [employer]huhttp://www.nhlbi.nih.gov/health/educational/lose_wt/BMI/bmicalc.htm

## 2018-05-13 ENCOUNTER — Ambulatory Visit (INDEPENDENT_AMBULATORY_CARE_PROVIDER_SITE_OTHER): Payer: BLUE CROSS/BLUE SHIELD | Admitting: Gastroenterology

## 2018-05-13 ENCOUNTER — Encounter: Payer: Self-pay | Admitting: Gastroenterology

## 2018-05-13 VITALS — BP 146/79 | HR 87 | Ht 72.0 in | Wt 225.6 lb

## 2018-05-13 DIAGNOSIS — R1319 Other dysphagia: Secondary | ICD-10-CM

## 2018-05-13 DIAGNOSIS — R1310 Dysphagia, unspecified: Secondary | ICD-10-CM | POA: Diagnosis not present

## 2018-05-13 DIAGNOSIS — R1013 Epigastric pain: Secondary | ICD-10-CM | POA: Diagnosis not present

## 2018-05-13 DIAGNOSIS — R1084 Generalized abdominal pain: Secondary | ICD-10-CM | POA: Diagnosis not present

## 2018-05-13 NOTE — Patient Instructions (Signed)
F/U 3 months  Dysphagia Dysphagia is trouble swallowing. This condition occurs when solids and liquids stick in a person's throat on the way down to the stomach, or when food takes longer to get to the stomach. You may have problems swallowing food, liquids, or both. You may also have pain while trying to swallow. It may take you more time and effort to swallow something. What are the causes? This condition is caused by:  Problems with the muscles. They may make it difficult for you to move food and liquids through the tube that connects your mouth to your stomach (esophagus). You may have ulcers, scar tissue, or inflammation that blocks the normal passage of food and liquids. Causes of these problems include: ? Acid reflux from your stomach into your esophagus (gastroesophageal reflux). ? Infections. ? Radiation treatment for cancer. ? Medicines taken without enough fluids to wash them down into your stomach.  Nerve problems. These prevent signals from being sent to the muscles of your esophagus to squeeze (contract) and move what you swallow down to your stomach.  Globus pharyngeus. This is a common problem that involves feeling like something is stuck in the throat or a sense of trouble with swallowing even though nothing is wrong with the swallowing passages.  Stroke. This can affect the nerves and make it difficult to swallow.  Certain conditions, such as cerebral palsy or Parkinson disease.  What are the signs or symptoms? Common symptoms of this condition include:  A feeling that solids or liquids are stuck in your throat on the way down to the stomach.  Food taking too long to get to the stomach.  Other symptoms include:  Food moving back from your stomach to your mouth (regurgitation).  Noises coming from your throat.  Chest discomfort with swallowing.  A feeling of fullness when swallowing.  Drooling, especially when the throat is blocked.  Pain while  swallowing.  Heartburn.  Coughing or gagging while trying to swallow.  How is this diagnosed? This condition is diagnosed by:  Barium X-ray. In this test, you swallow a white substance (contrast medium)that sticks to the inside of your esophagus. X-ray images are then taken.  Endoscopy. In this test, a flexible telescope is inserted down your throat to look at your esophagus and your stomach.  CT scans and MRI.  How is this treated? Treatment for dysphagia depends on the cause of the condition:  If the dysphagia is caused by acid reflux or infection, medicines may be used. They may include antibiotics and heartburn medicines.  If the dysphagia is caused by problems with your muscles, swallowing therapy may be used to help you strengthen your swallowing muscles. You may have to do specific exercises to strengthen the muscles or stretch them.  If the dysphagia is caused by a blockage or mass, procedures to remove the blockage may be done. You may need surgery and a feeding tube.  You may need to make diet changes. Ask your health care provider for specific instructions. Follow these instructions at home: Eating and drinking  Try to eat soft food that is easier to swallow.  Follow any diet changes as told by your health care provider.  Cut your food into small pieces and eat slowly.  Eat and drink only when you are sitting upright.  Do not drink alcohol or caffeine. If you need help quitting, ask your health care provider. General instructions  Check your weight every day to make sure you are not losing   weight.  Take over-the-counter and prescription medicines only as told by your health care provider.  If you were prescribed an antibiotic medicine, take it as told by your health care provider. Do not stop taking the antibiotic even if you start to feel better.  Do not use any products that contain nicotine or tobacco, such as cigarettes and e-cigarettes. If you need help  quitting, ask your health care provider.  Keep all follow-up visits as told by your health care provider. This is important. Contact a health care provider if:  You lose weight because you cannot swallow.  You cough when you drink liquids (aspiration).  You cough up partially digested food. Get help right away if:  You cannot swallow your saliva.  You have shortness of breath or a fever, or both.  You have a hoarse voice and also have trouble swallowing. Summary  Dysphagia is trouble swallowing. This condition occurs when solids and liquids stick in a person's throat on the way down to the stomach, or when food takes longer to get to the stomach.  Dysphagia has many possible causes and symptoms.  Treatment for dysphagia depends on the cause of the condition. This information is not intended to replace advice given to you by your health care provider. Make sure you discuss any questions you have with your health care provider. Document Released: 11/14/2000 Document Revised: 11/06/2016 Document Reviewed: 11/06/2016 Elsevier Interactive Patient Education  2017 Elsevier Inc.  

## 2018-05-13 NOTE — Progress Notes (Signed)
Donald [name], [place] [address]  Main: [phone]  Fax: [phone]   Gastroenterology Consultation  Referring Provider:     Arnetha Courser, MD Primary Care Physician:  Arnetha Courser, MD Primary Gastroenterologist:  Dr. Vonda Antigua Reason for Consultation:     Dysphagia, abdominal pain        HPI:    Chief Complaint  Patient presents with  . Establish Care    referred by Dr. Sanda Klein for Odynophagia. pt c/o stomach pain now also.    Donald [name] is a 52 y.o. y/o male referred for consultation & management  by Dr. Sanda Klein, Satira Anis, MD.  Patient reports an episode of dysphagia that occurred about 3 weeks ago.  Patient states he had a biscuit in the morning for breakfast, and felt that he had trouble swallowing it, but after drinking some liquids it passed.  He then had lunch around 2 PM, and had solid food that he was able to eat without difficulty, but noted midepigastric abdominal pain, about 30 to 40 minutes later, sharp, 10/10, that lasted for 1 hour, no radiation, no nausea or vomiting.  He states since then he has had intermittent abdominal pain, midepigastric, occurring 30 to 40 minutes after meals, and lasting 1 hour.  He was given omeprazole that he takes daily, and also takes sucralfate which helped somewhat but not relieved his pain.  He denies any over-the-counter NSAID use, but takes baby aspirin daily.  No previous EGDs.  Reports one episode of black stool, but this occurred after he took Pepto-Bismol.  Recent lab work 2 days ago shows normal hemoglobin.  Patient is also tried to take stool softeners, to see if having soft bowel movements helps his pain.  He states after bowel movement abdominal pain is relieved somewhat, but is not resolved.  Reports history of a colonoscopy 2016.  We do not have this procedure report the patient states it was done at [facility].  His health maintenance encounters, state next  colonoscopy should be in 2026.  I only see one colonoscopy report in 2004 in provation that was done for GI occult blood loss, and showed diverticulosis.  No family history of colon cancer.   Past Medical History:  Diagnosis Date  . Diabetes mellitus without complication ([place])   . Gout    R foot great toe   . Hypercholesteremia 2017  . Hypertension     Past Surgical History:  Procedure Laterality Date  . KNEE SURGERY Right     Prior to Admission medications   Medication Sig Start Date End Date Taking? Authorizing Provider  aspirin EC 81 MG EC tablet Take 1 tablet (81 mg total) by mouth daily. 07/23/16  Yes Hillary Bow, MD  BD PEN NEEDLE MICRO U/F 32G X 6 MM MISC USE FOR LEVEMIR INJECTION DAILY 12/15/17  Yes Keith Rake Asad A, MD  Blood Glucose Monitoring Suppl (D-CARE GLUCOMETER) w/Device KIT 1 Units by Does not apply route 4 (four) times daily -  before meals and at bedtime. 07/23/16  Yes Hillary Bow, MD  dicyclomine (BENTYL) 20 MG tablet  04/28/18  Yes [provider]  Insulin Detemir (LEVEMIR FLEXTOUCH) 100 UNIT/ML Pen Inject 10 Units into the skin daily at 10 pm. Patient taking differently: Inject 15 Units into the skin daily at 10 pm.  11/27/17  Yes Rochel Brome A, MD  lisinopril (PRINIVIL,ZESTRIL) 10 MG tablet Take 1 tablet (10 mg  total) by mouth daily. 02/26/18  Yes Lada, Satira Anis, MD  meloxicam (MOBIC) 7.5 MG tablet TAKE 1 TABLET (7.5 MG TOTAL) BY MOUTH DAILY AS NEEDED FOR PAIN. 03/17/18  Yes Lada, Satira Anis, MD  metFORMIN (GLUCOPHAGE) 1000 MG tablet Take 1 tablet (1,000 mg total) by mouth 2 (two) times daily with a meal. 02/23/18 05/24/18 Yes Lada, Satira Anis, MD  omeprazole (PRILOSEC) 20 MG capsule  04/28/18  Yes [provider]  sucralfate (CARAFATE) 1 g tablet Take 1 tablet (1 g total) by mouth 4 (four) times daily. 05/01/18  Yes Nance Pear, MD  rosuvastatin (CRESTOR) 20 MG tablet Take 1 tablet (20 mg total) by mouth at bedtime. For cholesterol; this  replaces simvastatin Patient not taking: Reported on 05/13/2018 05/07/18   Arnetha Courser, MD    Family History  Problem Relation Age of Onset  . Hypertension Father   . Diabetes Father   . Liver disease Father   . Diverticulitis Mother   . Migraines Mother   . Cancer Sister        tumors in the breast   . Diabetes Maternal Grandfather   . Cancer Sister        tumors in the breast   . Cancer Sister        tumor in the breast      Social History   Tobacco Use  . Smoking status: Current Every Day Smoker    Packs/day: 1.00    Years: 30.00    Pack years: 30.00    Types: Cigarettes  . Smokeless tobacco: Never Used  Substance Use Topics  . Alcohol use: Yes    Alcohol/week: 0.0 oz    Comment: none in the last 3 weeks  . Drug use: No    Allergies as of 05/13/2018 - Review Complete 05/13/2018  Allergen Reaction Noted  . No known allergies      Review of Systems:    All systems reviewed and negative except where noted in HPI.   Physical Exam:  BP (!) 146/79   Pulse 87   Ht 6' (1.829 m)   Wt 225 lb 9.6 oz (102.3 kg)   BMI 30.60 kg/m  No LMP for male patient. Psych:  Alert and cooperative. Normal mood and affect. General:   Alert,  Well-developed, well-nourished, pleasant and cooperative in NAD Head:  Normocephalic and atraumatic. Eyes:  Sclera clear, no icterus.   Conjunctiva pink. Ears:  Normal auditory acuity. Nose:  No deformity, discharge, or lesions. Mouth:  No deformity or lesions,oropharynx pink & moist. Neck:  Supple; no masses or thyromegaly. Lungs:  Respirations even and unlabored.  Clear throughout to auscultation.   No wheezes, crackles, or rhonchi. No acute distress. Heart:  Regular rate and rhythm; no murmurs, clicks, rubs, or gallops. Abdomen:  Normal bowel sounds.  No bruits.  Soft, non-tender and non-distended without masses, hepatosplenomegaly or hernias noted.  No guarding or rebound tenderness.    Msk:  Symmetrical without gross deformities. Good,  equal movement & strength bilaterally. Pulses:  Normal pulses noted. Extremities:  No clubbing or edema.  No cyanosis. Neurologic:  Alert and oriented x3;  grossly normal neurologically. Skin:  Intact without significant lesions or rashes. No jaundice. Lymph Nodes:  No significant cervical adenopathy. Psych:  Alert and cooperative. Normal mood and affect.   Labs: CBC    Component Value Date/Time   WBC 10.2 05/11/2018 0905   RBC 4.17 (L) 05/11/2018 0905   HGB 13.4 05/11/2018 0905  HCT 37.7 (L) 05/11/2018 0905   PLT 350 05/11/2018 0905   MCV 90.4 05/11/2018 0905   MCH 32.1 05/11/2018 0905   MCHC 35.5 05/11/2018 0905   RDW 12.9 05/11/2018 0905   LYMPHSABS 2,887 05/11/2018 0905   MONOABS 0.9 08/05/2017 1509   EOSABS 224 05/11/2018 0905   BASOSABS 71 05/11/2018 0905   CMP     Component Value Date/Time   NA 137 05/11/2018 0905   K 4.4 05/11/2018 0905   CL 105 05/11/2018 0905   CO2 24 05/11/2018 0905   GLUCOSE 148 (H) 05/11/2018 0905   BUN 15 05/11/2018 0905   CREATININE 0.78 05/11/2018 0905   CALCIUM 9.2 05/11/2018 0905   PROT 7.3 05/11/2018 0905   ALBUMIN 4.0 05/01/2018 1600   AST 10 05/11/2018 0905   ALT 11 05/11/2018 0905   ALKPHOS 79 05/01/2018 1600   BILITOT 0.2 05/11/2018 0905   GFRNONAA 103 05/11/2018 0905   GFRAA 119 05/11/2018 0905    Imaging Studies: Dg Chest 2 View  Result Date: 05/11/2018 CLINICAL DATA:  Chest pain.  Hypertension. EXAM: CHEST - 2 VIEW COMPARISON:  None. FINDINGS: Lungs are clear. Heart size and pulmonary vascularity are normal. No adenopathy. There is slight degenerative change in the thoracic spine. IMPRESSION: No edema or consolidation. Electronically Signed   By: Lowella Grip III M.D.   On: 05/11/2018 13:17    Assessment and Plan:   JVION TURGEON is a 52 y.o. y/o male has been referred for dysphasia occurred one time 3 weeks ago, and intermittent abdominal pain since then unrelieved with PPI, with daily aspirin use at home, with  one episode of black stool 1 to 2 weeks ago, but after taking Pepto-Bismol  Due to severe abdominal pain occurring intermittently, 30 to 40 minutes after food, and daily aspirin use, peptic ulcer disease is high on the differential.  Possibly duodenal ulcer. The melanotic episode might have been due to Pepto-Bismol itself, given that his hemoglobin is normal, but could have been due to peptic ulcer also EGD is indicated for evaluation Proper PPI use was discussed with patient, 30 minutes before breakfast Depending on EGD findings, this medication dosage can be increased (Risks of PPI use were discussed with patient including bone loss, C. Diff diarrhea, pneumonia, infections, CKD, electrolyte abnormalities.  If clinically possible based on symptoms, goal would be to maintain patient on the lowest dose possible, or discontinue the medication with institution of acid reflux lifestyle modifications over time. Pt. Verbalizes understanding and chooses to continue the medication.)  No hematochezia or any symptoms to indicate colonoscopy at this time  I have discussed alternative options, risks & benefits,  which include, but are not limited to, bleeding, infection, perforation,respiratory complication & drug reaction.  The patient agrees with this plan & written consent will be obtained.    Patient asked to avoid any NSAIDs, except baby aspirin that he states he is taking as per the instructions of his primary care provider for primary prevention.   Dr Donald Antigua

## 2018-05-25 ENCOUNTER — Ambulatory Visit
Admission: RE | Admit: 2018-05-25 | Discharge: 2018-05-25 | Disposition: A | Discharge status: Home / Self Care | Payer: BLUE CROSS/BLUE SHIELD | Source: Ambulatory Visit | Attending: Family Medicine | Admitting: Family Medicine

## 2018-05-25 DIAGNOSIS — D72829 Elevated white blood cell count, unspecified: Secondary | ICD-10-CM | POA: Insufficient documentation

## 2018-05-25 DIAGNOSIS — R1011 Right upper quadrant pain: Secondary | ICD-10-CM | POA: Insufficient documentation

## 2018-05-26 ENCOUNTER — Encounter
Admission: RE | Disposition: A | Discharge status: Home / Self Care | Payer: Self-pay | Source: Ambulatory Visit | Attending: Gastroenterology

## 2018-05-26 ENCOUNTER — Ambulatory Visit: Payer: BLUE CROSS/BLUE SHIELD | Admitting: Anesthesiology

## 2018-05-26 ENCOUNTER — Ambulatory Visit
Admission: RE | Admit: 2018-05-26 | Discharge: 2018-05-26 | Disposition: A | Discharge status: Home / Self Care | Payer: BLUE CROSS/BLUE SHIELD | Source: Ambulatory Visit | Attending: Gastroenterology | Admitting: Gastroenterology

## 2018-05-26 ENCOUNTER — Encounter: Payer: Self-pay | Admitting: *Deleted

## 2018-05-26 DIAGNOSIS — K228 Other specified diseases of esophagus: Secondary | ICD-10-CM

## 2018-05-26 DIAGNOSIS — I10 Essential (primary) hypertension: Secondary | ICD-10-CM | POA: Diagnosis not present

## 2018-05-26 DIAGNOSIS — R1314 Dysphagia, pharyngoesophageal phase: Secondary | ICD-10-CM | POA: Insufficient documentation

## 2018-05-26 DIAGNOSIS — E119 Type 2 diabetes mellitus without complications: Secondary | ICD-10-CM | POA: Insufficient documentation

## 2018-05-26 DIAGNOSIS — E1151 Type 2 diabetes mellitus with diabetic peripheral angiopathy without gangrene: Secondary | ICD-10-CM | POA: Diagnosis not present

## 2018-05-26 DIAGNOSIS — Z803 Family history of malignant neoplasm of breast: Secondary | ICD-10-CM | POA: Insufficient documentation

## 2018-05-26 DIAGNOSIS — R1013 Epigastric pain: Secondary | ICD-10-CM | POA: Diagnosis not present

## 2018-05-26 DIAGNOSIS — Z7984 Long term (current) use of oral hypoglycemic drugs: Secondary | ICD-10-CM | POA: Diagnosis not present

## 2018-05-26 DIAGNOSIS — Z833 Family history of diabetes mellitus: Secondary | ICD-10-CM | POA: Diagnosis not present

## 2018-05-26 DIAGNOSIS — K3189 Other diseases of stomach and duodenum: Secondary | ICD-10-CM | POA: Diagnosis not present

## 2018-05-26 DIAGNOSIS — M109 Gout, unspecified: Secondary | ICD-10-CM | POA: Diagnosis not present

## 2018-05-26 DIAGNOSIS — F17210 Nicotine dependence, cigarettes, uncomplicated: Secondary | ICD-10-CM | POA: Insufficient documentation

## 2018-05-26 DIAGNOSIS — L538 Other specified erythematous conditions: Secondary | ICD-10-CM | POA: Diagnosis not present

## 2018-05-26 DIAGNOSIS — Z7982 Long term (current) use of aspirin: Secondary | ICD-10-CM | POA: Insufficient documentation

## 2018-05-26 DIAGNOSIS — K2970 Gastritis, unspecified, without bleeding: Secondary | ICD-10-CM

## 2018-05-26 DIAGNOSIS — K2950 Unspecified chronic gastritis without bleeding: Secondary | ICD-10-CM | POA: Diagnosis not present

## 2018-05-26 DIAGNOSIS — R12 Heartburn: Secondary | ICD-10-CM

## 2018-05-26 DIAGNOSIS — K31819 Angiodysplasia of stomach and duodenum without bleeding: Secondary | ICD-10-CM | POA: Diagnosis not present

## 2018-05-26 DIAGNOSIS — E7800 Pure hypercholesterolemia, unspecified: Secondary | ICD-10-CM | POA: Insufficient documentation

## 2018-05-26 DIAGNOSIS — R1319 Other dysphagia: Secondary | ICD-10-CM | POA: Diagnosis not present

## 2018-05-26 DIAGNOSIS — K2289 Other specified disease of esophagus: Secondary | ICD-10-CM

## 2018-05-26 DIAGNOSIS — Z8249 Family history of ischemic heart disease and other diseases of the circulatory system: Secondary | ICD-10-CM | POA: Insufficient documentation

## 2018-05-26 DIAGNOSIS — Z8379 Family history of other diseases of the digestive system: Secondary | ICD-10-CM | POA: Insufficient documentation

## 2018-05-26 DIAGNOSIS — Z79899 Other long term (current) drug therapy: Secondary | ICD-10-CM | POA: Insufficient documentation

## 2018-05-26 DIAGNOSIS — R1084 Generalized abdominal pain: Secondary | ICD-10-CM

## 2018-05-26 DIAGNOSIS — R1310 Dysphagia, unspecified: Secondary | ICD-10-CM

## 2018-05-26 HISTORY — PX: ESOPHAGOGASTRODUODENOSCOPY (EGD) WITH PROPOFOL: SHX5813

## 2018-05-26 LAB — KOH PREP
KOH Prep: NONE SEEN
Special Requests: NORMAL

## 2018-05-26 LAB — GLUCOSE, CAPILLARY: Glucose-Capillary: 153 mg/dL — ABNORMAL HIGH (ref 70–99)

## 2018-05-26 SURGERY — ESOPHAGOGASTRODUODENOSCOPY (EGD) WITH PROPOFOL
Anesthesia: General

## 2018-05-26 MED ORDER — LIDOCAINE HCL (PF) 2 % IJ SOLN
INTRAMUSCULAR | Status: AC
  Filled 2018-05-26: qty 10

## 2018-05-26 MED ORDER — FENTANYL CITRATE (PF) 100 MCG/2ML IJ SOLN
INTRAMUSCULAR | Status: DC | PRN
  Administered 2018-05-26 (×2): 50 ug via INTRAVENOUS

## 2018-05-26 MED ORDER — SODIUM CHLORIDE 0.9 % IV SOLN
INTRAVENOUS | Status: DC
  Administered 2018-05-26 (×2): via INTRAVENOUS

## 2018-05-26 MED ORDER — PROPOFOL 500 MG/50ML IV EMUL
INTRAVENOUS | Status: DC | PRN
  Administered 2018-05-26: 150 ug/kg/min via INTRAVENOUS

## 2018-05-26 MED ORDER — PROPOFOL 10 MG/ML IV BOLUS
INTRAVENOUS | Status: DC | PRN
  Administered 2018-05-26: 30 mg via INTRAVENOUS
  Administered 2018-05-26: 100 mg via INTRAVENOUS

## 2018-05-26 MED ORDER — LIDOCAINE 2% (20 MG/ML) 5 ML SYRINGE
INTRAMUSCULAR | Status: DC | PRN
  Administered 2018-05-26: 30 mg via INTRAVENOUS

## 2018-05-26 MED ORDER — FENTANYL CITRATE (PF) 100 MCG/2ML IJ SOLN
INTRAMUSCULAR | Status: AC
  Filled 2018-05-26: qty 4

## 2018-05-26 MED ORDER — PROPOFOL 500 MG/50ML IV EMUL
INTRAVENOUS | Status: AC
  Filled 2018-05-26: qty 50

## 2018-05-26 NOTE — Anesthesia Post-op Follow-up Note (Signed)
Anesthesia QCDR form completed.        

## 2018-05-26 NOTE — Anesthesia Postprocedure Evaluation (Signed)
Anesthesia Post Note  Patient: Donald [name]  Procedure(s) Performed: ESOPHAGOGASTRODUODENOSCOPY (EGD) WITH PROPOFOL (N/A )  Patient location during evaluation: Endoscopy Anesthesia Type: General Level of consciousness: awake and alert Pain management: pain level controlled Vital Signs Assessment: post-procedure vital signs reviewed and stable Respiratory status: spontaneous breathing, nonlabored ventilation, respiratory function stable and patient connected to nasal cannula oxygen Cardiovascular status: blood pressure returned to baseline and stable Postop Assessment: no apparent nausea or vomiting Anesthetic complications: no     Last Vitals:  Vitals:   05/26/18 1040 05/26/18 1050  BP: (!) 140/93 (!) 140/101  Pulse: 82 79  Resp: 16 18  Temp:    SpO2: 99% 100%    Last Pain:  Vitals:   05/26/18 1020  TempSrc: Tympanic                 Lenard SimmerAndrew Rameen Gohlke

## 2018-05-26 NOTE — Transfer of Care (Signed)
Immediate Anesthesia Transfer of Care Note  Patient: Donald [name]  Procedure(s) Performed: ESOPHAGOGASTRODUODENOSCOPY (EGD) WITH PROPOFOL (N/A )  Patient Location: PACU and Endoscopy Unit  Anesthesia Type:General  Level of Consciousness: awake and patient cooperative  Airway & Oxygen Therapy: Patient Spontanous Breathing  Post-op Assessment: Report given to RN and Post -op Vital signs reviewed and stable  Post vital signs: Reviewed and stable  Last Vitals:  Vitals Value Taken Time  BP 122/87 05/26/2018 10:24 AM  Temp 36.1 C 05/26/2018 10:20 AM  Pulse 87 05/26/2018 10:25 AM  Resp 18 05/26/2018 10:25 AM  SpO2 99 % 05/26/2018 10:25 AM  Vitals shown include unvalidated device data.  Last Pain:  Vitals:   05/26/18 1020  TempSrc: Tympanic         Complications: No apparent anesthesia complications

## 2018-05-26 NOTE — H&P (Signed)
Vonda Antigua, MD [address] [place], [place], [place], [address] [address], [place], [place], [place], [address] Phone: [phone]  Fax: [phone]  Primary Care Physician:  Arnetha Courser, MD   Pre-Procedure History & Physical: HPI:  Donald [name] is a 52 y.o. male is here for an EGD.   Past Medical History:  Diagnosis Date  . Diabetes mellitus without complication ([place])   . Gout    R foot great toe   . Hypercholesteremia 2017  . Hypertension     Past Surgical History:  Procedure Laterality Date  . KNEE SURGERY Right     Prior to Admission medications   Medication Sig Start Date End Date Taking? Authorizing Provider  aspirin EC 81 MG EC tablet Take 1 tablet (81 mg total) by mouth daily. 07/23/16  Yes Hillary Bow, MD  dicyclomine (BENTYL) 20 MG tablet  04/28/18  Yes [provider]  Insulin Detemir (LEVEMIR FLEXTOUCH) 100 UNIT/ML Pen Inject 10 Units into the skin daily at 10 pm. Patient taking differently: Inject 15 Units into the skin daily at 10 pm.  11/27/17  Yes Rochel Brome A, MD  lisinopril (PRINIVIL,ZESTRIL) 10 MG tablet Take 1 tablet (10 mg total) by mouth daily. 02/26/18  Yes Arnetha Courser, MD  omeprazole (PRILOSEC) 20 MG capsule  04/28/18  Yes [provider]  rosuvastatin (CRESTOR) 20 MG tablet Take 1 tablet (20 mg total) by mouth at bedtime. For cholesterol; this replaces simvastatin 05/07/18  Yes Lada, Satira Anis, MD  sucralfate (CARAFATE) 1 g tablet Take 1 tablet (1 g total) by mouth 4 (four) times daily. 05/01/18  Yes Nance Pear, MD  BD PEN NEEDLE MICRO U/F 32G X 6 MM MISC USE FOR LEVEMIR INJECTION DAILY 12/15/17   Roselee Nova, MD  Blood Glucose Monitoring Suppl (D-CARE GLUCOMETER) w/Device KIT 1 Units by Does not apply route 4 (four) times daily -  before meals and at bedtime. 07/23/16   Sudini, Alveta Heimlich, MD  meloxicam (MOBIC) 7.5 MG tablet TAKE 1 TABLET (7.5 MG TOTAL) BY MOUTH DAILY AS NEEDED FOR PAIN. Patient not  taking: Reported on 05/26/2018 03/17/18   Arnetha Courser, MD  metFORMIN (GLUCOPHAGE) 1000 MG tablet Take 1 tablet (1,000 mg total) by mouth 2 (two) times daily with a meal. 02/23/18 05/24/18  Arnetha Courser, MD    Allergies as of 05/14/2018 - Review Complete 05/13/2018  Allergen Reaction Noted  . No known allergies      Family History  Problem Relation Age of Onset  . Hypertension Father   . Diabetes Father   . Liver disease Father   . Diverticulitis Mother   . Migraines Mother   . Cancer Sister        tumors in the breast   . Diabetes Maternal Grandfather   . Cancer Sister        tumors in the breast   . Cancer Sister        tumor in the breast     Social History   Socioeconomic History  . Marital status: Married    Spouse name: Not on file  . Number of children: Not on file  . Years of education: Not on file  . Highest education level: Not on file  Occupational History  . Not on file  Social Needs  . Financial resource strain: Not on file  . Food insecurity:    Worry: Not on file    Inability: Not on file  .  Transportation needs:    Medical: Not on file    Non-medical: Not on file  Tobacco Use  . Smoking status: Current Every Day Smoker    Packs/day: 1.00    Years: 30.00    Pack years: 30.00    Types: Cigarettes  . Smokeless tobacco: Never Used  Substance and Sexual Activity  . Alcohol use: Yes    Alcohol/week: 0.0 oz    Comment: none in the last 3 weeks  . Drug use: No  . Sexual activity: Yes    Partners: Female  Lifestyle  . Physical activity:    Days per week: Not on file    Minutes per session: Not on file  . Stress: Not on file  Relationships  . Social connections:    Talks on phone: Not on file    Gets together: Not on file    Attends religious service: Not on file    Active member of club or organization: Not on file    Attends meetings of clubs or organizations: Not on file    Relationship status: Not on file  . Intimate partner violence:      Fear of current or ex partner: Not on file    Emotionally abused: Not on file    Physically abused: Not on file    Forced sexual activity: Not on file  Other Topics Concern  . Not on file  Social History Narrative  . Not on file    Review of Systems: See HPI, otherwise negative ROS  Physical Exam: BP (!) 130/98   Pulse 99   Temp (!) 97.1 F (36.2 C) (Tympanic)   Resp 18   Ht 5' 11" (1.803 m)   Wt 227 lb (103 kg)   SpO2 100%   BMI 31.66 kg/m  General:   Alert,  pleasant and cooperative in NAD Head:  Normocephalic and atraumatic. Neck:  Supple; no masses or thyromegaly. Lungs:  Clear throughout to auscultation, normal respiratory effort.    Heart:  +S1, +S2, Regular rate and rhythm, No edema. Abdomen:  Soft, nontender and nondistended. Normal bowel sounds, without guarding, and without rebound.   Neurologic:  Alert and  oriented x4;  grossly normal neurologically.  Impression/Plan: Donald [name] is here for an EGD for dysphagia, abdominal pain.   Risks, benefits, limitations, and alternatives regarding the procedure have been reviewed with the patient.  Questions have been answered.  All parties agreeable.   Virgel Manifold, MD  05/26/2018, 9:55 AM

## 2018-05-26 NOTE — Anesthesia Preprocedure Evaluation (Signed)
Anesthesia Evaluation  Patient identified by MRN, date of birth, ID band Patient awake    Reviewed: Allergy & Precautions, H&P , NPO status , Patient's Chart, lab work & pertinent test results, reviewed documented beta blocker date and time   History of Anesthesia Complications Negative for: history of anesthetic complications  Airway Mallampati: III  TM Distance: >3 FB Neck ROM: full    Dental  (+) Missing, Dental Advidsory Given, Teeth Intact, Poor Dentition   Pulmonary neg shortness of breath, neg sleep apnea, neg COPD, neg recent URI, Current Smoker,           Cardiovascular Exercise Tolerance: Good hypertension, (-) angina+ Peripheral Vascular Disease  (-) CAD, (-) Past MI, (-) Cardiac Stents and (-) CABG negative cardio ROS  (-) dysrhythmias (-) Valvular Problems/Murmurs     Neuro/Psych negative neurological ROS  negative psych ROS   GI/Hepatic Neg liver ROS, GERD  ,  Endo/Other  diabetes  Renal/GU negative Renal ROS  negative genitourinary   Musculoskeletal   Abdominal   Peds  Hematology negative hematology ROS (+)   Anesthesia Other Findings Past Medical History: No date: Diabetes mellitus without complication ([facility]) No date: Gout     Comment:  R foot great toe  2017: Hypercholesteremia No date: Hypertension   Reproductive/Obstetrics negative OB ROS                             Anesthesia Physical Anesthesia Plan  ASA: III  Anesthesia Plan: General   Post-op Pain Management:    Induction: Intravenous  PONV Risk Score and Plan: 1 and Propofol infusion  Airway Management Planned: Nasal Cannula  Additional Equipment:   Intra-op Plan:   Post-operative Plan:   Informed Consent: I have reviewed the patients History and Physical, chart, labs and discussed the procedure including the risks, benefits and alternatives for the proposed anesthesia with the patient or  authorized representative who has indicated his/her understanding and acceptance.   Dental Advisory Given  Plan Discussed with: Anesthesiologist, CRNA and Surgeon  Anesthesia Plan Comments:         Anesthesia Quick Evaluation

## 2018-05-26 NOTE — Op Note (Signed)
[facility] Gastroenterology Patient Name: Donald [name] Procedure Date: 05/26/2018 9:52 AM MRN: [id number] Account #: [id number] Date of Birth: 07/22/1966 Admit Type: Outpatient Age: 52 Room: [facility] ENDO ROOM 2 Gender: Male Note Status: Finalized Procedure:            Upper GI endoscopy Indications:          Epigastric abdominal pain, Dysphagia, Heartburn Providers:            Varnita B. Maximino Greenland MD, MD Referring MD:         Kerman Passey (Referring MD) Medicines:            Monitored Anesthesia Care Complications:        No immediate complications. Procedure:            Pre-Anesthesia Assessment:                       - Prior to the procedure, a History and Physical was                        performed, and patient medications, allergies and                        sensitivities were reviewed. The patient's tolerance of                        previous anesthesia was reviewed.                       - The risks and benefits of the procedure and the                        sedation options and risks were discussed with the                        patient. All questions were answered and informed                        consent was obtained.                       - Patient identification and proposed procedure were                        verified prior to the procedure by the physician, the                        nurse, the anesthesiologist, the anesthetist and the                        technician. The procedure was verified in the procedure                        room.                       - ASA Grade Assessment: III - A patient with severe                        systemic disease.  After obtaining informed consent, the endoscope was                        passed under direct vision. Throughout the procedure,                        the patient's blood pressure, pulse, and oxygen                        saturations were monitored continuously. The  Endoscope                        was introduced through the mouth, and advanced to the                        third part of duodenum. The upper GI endoscopy was                        accomplished with ease. The patient tolerated the                        procedure well. Findings:      White nummular lesions were noted in the distal esophagus. Brushings for       KOH prep were obtained in the distal esophagus.      The examined esophagus was otherwise normal. Biopsies were obtained from       the proximal and distal esophagus with cold forceps for histology of       suspected eosinophilic esophagitis.      A single no bleeding angioectasia was found at the incisura. The gastric       biopsies performed as detailed below were performed well away from the       AVM.      Patchy mildly erythematous mucosa without bleeding was found in the       gastric antrum. Biopsies were taken with a cold forceps for histology.       Biopsies were obtained in the gastric body, at the incisura and in the       gastric antrum with cold forceps for histology.      Patchy mildly erythematous mucosa without active bleeding and with no       stigmata of bleeding was found in the duodenal bulb.      The second portion of the duodenum, third portion of the duodenum and       examined duodenum were normal. Impression:           - White nummular lesions in esophageal mucosa.                        Brushings performed.                       - Normal esophagus. Biopsied.                       - A single non-bleeding angioectasia in the stomach.                       - Erythematous mucosa in the antrum. Biopsied.                       -  Erythematous duodenopathy.                       - Normal second portion of the duodenum, third portion                        of the duodenum and examined duodenum.                       - Biopsies were obtained in the gastric body, at the                        incisura and in  the gastric antrum. Recommendation:       - Await pathology results.                       - Discharge patient to home (with escort).                       - Advance diet as tolerated.                       - Continue present medications.                       - Patient has a contact number available for                        emergencies. The signs and symptoms of potential                        delayed complications were discussed with the patient.                        Return to normal activities tomorrow. Written discharge                        instructions were provided to the patient.                       - Discharge patient to home (with escort).                       - The findings and recommendations were discussed with                        the patient.                       - The findings and recommendations were discussed with                        the patient's family. Procedure Code(s):    --- Professional ---                       [phone], Esophagogastroduodenoscopy, flexible, transoral;                        with biopsy, single or multiple Diagnosis Code(s):    --- Professional ---                       K22.8, Other  specified diseases of esophagus                       K31.819, Angiodysplasia of stomach and duodenum without                        bleeding                       K31.89, Other diseases of stomach and duodenum                       R10.13, Epigastric pain                       R13.10, Dysphagia, unspecified                       R12, Heartburn CPT copyright 2017 American Medical Association. All rights reserved. The codes documented in this report are preliminary and upon coder review may  be revised to meet current compliance requirements.  Melodie Bouillon, MD Michel Bickers B. Maximino Greenland MD, MD 05/26/2018 10:19:38 AM This report has been signed electronically. Number of Addenda: 0 Note Initiated On: 05/26/2018 9:52 AM Estimated Blood Loss: Estimated  blood loss: none.      [facility]

## 2018-05-27 ENCOUNTER — Encounter: Payer: Self-pay | Admitting: Family Medicine

## 2018-05-27 ENCOUNTER — Ambulatory Visit (INDEPENDENT_AMBULATORY_CARE_PROVIDER_SITE_OTHER): Payer: BLUE CROSS/BLUE SHIELD | Admitting: Family Medicine

## 2018-05-27 DIAGNOSIS — E669 Obesity, unspecified: Secondary | ICD-10-CM | POA: Diagnosis not present

## 2018-05-27 DIAGNOSIS — F17200 Nicotine dependence, unspecified, uncomplicated: Secondary | ICD-10-CM

## 2018-05-27 DIAGNOSIS — K228 Other specified diseases of esophagus: Secondary | ICD-10-CM | POA: Diagnosis not present

## 2018-05-27 DIAGNOSIS — E1151 Type 2 diabetes mellitus with diabetic peripheral angiopathy without gangrene: Secondary | ICD-10-CM

## 2018-05-27 DIAGNOSIS — E66811 Obesity, class 1: Secondary | ICD-10-CM | POA: Insufficient documentation

## 2018-05-27 DIAGNOSIS — K2289 Other specified disease of esophagus: Secondary | ICD-10-CM | POA: Insufficient documentation

## 2018-05-27 LAB — SURGICAL PATHOLOGY

## 2018-05-27 NOTE — Assessment & Plan Note (Signed)
Encouraged weight loss; try to get BMI under 30 as a start

## 2018-05-27 NOTE — Assessment & Plan Note (Signed)
Noted on EGD

## 2018-05-27 NOTE — Patient Instructions (Addendum)
I do encourage you to quit smoking Call [phone] to sign up for smoking cessation classes You can call 1-800-QUIT-NOW to talk with a smoking cessation coach  Check out the information at familydoctor.org entitled "Nutrition for Weight Loss: What You Need to Know about Fad Diets" Try to lose between 1-2 pounds per week by taking in fewer calories and burning off more calories You can succeed by limiting portions, limiting foods dense in calories and fat, becoming more active, and drinking 8 glasses of water a day (64 ounces) Don't skip meals, especially breakfast, as skipping meals may alter your metabolism Do not use over-the-counter weight loss pills or gimmicks that claim rapid weight loss A healthy BMI (or body mass index) is between 18.5 and 24.9 You can calculate your ideal BMI at the NIH website [employer].huhttp://www.nhlbi.nih.gov/health/educational/lose_wt/BMI/bmicalc.htm  Obesity, Adult Obesity is the condition of having too much total body fat. Being overweight or obese means that your weight is greater than what is considered healthy for your body size. Obesity is determined by a measurement called BMI. BMI is an estimate of body fat and is calculated from height and weight. For adults, a BMI of 30 or higher is considered obese. Obesity can eventually lead to other health concerns and major illnesses, including:  Stroke.  Coronary artery disease (CAD).  Type 2 diabetes.  Some types of cancer, including cancers of the colon, breast, uterus, and gallbladder.  Osteoarthritis.  High blood pressure (hypertension).  High cholesterol.  Sleep apnea.  Gallbladder stones.  Infertility problems.  What are the causes? The main cause of obesity is taking in (consuming) more calories than your body uses for energy. Other factors that contribute to this condition may include:  Being born with genes that make you more likely to become obese.  Having a medical condition that causes  obesity. These conditions include: ? Hypothyroidism. ? Polycystic ovarian syndrome (PCOS). ? Binge-eating disorder. ? Cushing syndrome.  Taking certain medicines, such as steroids, antidepressants, and seizure medicines.  Not being physically active (sedentary lifestyle).  Living where there are limited places to exercise safely or buy healthy foods.  Not getting enough sleep.  What increases the risk? The following factors may increase your risk of this condition:  Having a family history of obesity.  Being a woman of African-American descent.  Being a man of Hispanic descent.  What are the signs or symptoms? Having excessive body fat is the main symptom of this condition. How is this diagnosed? This condition may be diagnosed based on:  Your symptoms.  Your medical history.  A physical exam. Your health care provider may measure: ? Your BMI. If you are an adult with a BMI between 25 and less than 30, you are considered overweight. If you are an adult with a BMI of 30 or higher, you are considered obese. ? The distances around your hips and your waist (circumferences). These may be compared to each other to help diagnose your condition. ? Your skinfold thickness. Your health care provider may gently pinch a fold of your skin and measure it.  How is this treated? Treatment for this condition often includes changing your lifestyle. Treatment may include some or all of the following:  Dietary changes. Work with your health care provider and a dietitian to set a weight-loss goal that is healthy and reasonable for you. Dietary changes may include eating: ? Smaller portions. A portion size is the amount of a particular food that is healthy for you to eat  at one time. This varies from person to person. ? Low-calorie or low-fat options. ? More whole grains, fruits, and vegetables.  Regular physical activity. This may include aerobic activity (cardio) and strength  training.  Medicine to help you lose weight. Your health care provider may prescribe medicine if you are unable to lose 1 pound a week after 6 weeks of eating more healthily and doing more physical activity.  Surgery. Surgical options may include gastric banding and gastric bypass. Surgery may be done if: ? Other treatments have not helped to improve your condition. ? You have a BMI of 40 or higher. ? You have life-threatening health problems related to obesity.  Follow these instructions at home:  Eating and drinking   Follow recommendations from your health care provider about what you eat and drink. Your health care provider may advise you to: ? Limit fast foods, sweets, and processed snack foods. ? Choose low-fat options, such as low-fat milk instead of whole milk. ? Eat 5 or more servings of fruits or vegetables every day. ? Eat at home more often. This gives you more control over what you eat. ? Choose healthy foods when you eat out. ? Learn what a healthy portion size is. ? Keep low-fat snacks on hand. ? Avoid sugary drinks, such as soda, fruit juice, iced tea sweetened with sugar, and flavored milk. ? Eat a healthy breakfast.  Drink enough water to keep your urine clear or pale yellow.  Do not go without eating for long periods of time (do not fast) or follow a fad diet. Fasting and fad diets can be unhealthy and even dangerous. Physical Activity  Exercise regularly, as told by your health care provider. Ask your health care provider what types of exercise are safe for you and how often you should exercise.  Warm up and stretch before being active.  Cool down and stretch after being active.  Rest between periods of activity. Lifestyle  Limit the time that you spend in front of your TV, computer, or video game system.  Find ways to reward yourself that do not involve food.  Limit alcohol intake to no more than 1 drink a day for nonpregnant women and 2 drinks a day  for men. One drink equals 12 oz of beer, 5 oz of wine, or 1 oz of hard liquor. General instructions  Keep a weight loss journal to keep track of the food you eat and how much you exercise you get.  Take over-the-counter and prescription medicines only as told by your health care provider.  Take vitamins and supplements only as told by your health care provider.  Consider joining a support group. Your health care provider may be able to recommend a support group.  Keep all follow-up visits as told by your health care provider. This is important. Contact a health care provider if:  You are unable to meet your weight loss goal after 6 weeks of dietary and lifestyle changes. This information is not intended to replace advice given to you by your health care provider. Make sure you discuss any questions you have with your health care provider. Document Released: 12/25/2004 Document Revised: 04/21/2016 Document Reviewed: 09/05/2015 Elsevier Interactive Patient Education  2018 [employer].

## 2018-05-27 NOTE — Progress Notes (Signed)
BP 128/80   Pulse 95   Temp 98.3 F (36.8 C) (Oral)   Resp 14   Ht 6' (1.829 m)   Wt 225 lb 8 oz (102.3 kg)   SpO2 97%   BMI 30.58 kg/m    Subjective:    Patient ID: Donald [name], male    DOB: [birth year], 52 y.o.   MRN: [id number]  HPI: Donald [name] is a 52 y.o. male  Chief Complaint  Patient presents with  . Follow-up    HPI Patient is here for f/u He was seen on 05/11/2018; had dysphagia, was seen by Dr. Maximino Greenland and underwent an EGD yesterday No yeast on the KOH Biopsies pending He is feeling much better He has abdominal US and CXR too He is not interested in quitting smoking right now When he found out he had diabetes, he had to quit a whole lot of other stuff; not ready to give these up High protein was noted on previous labs but was back down to normal on 05/11/18 Diabetes mellitus; under good control Lab Results  Component Value Date   HGBA1C 6.8 (H) 04/29/2018    He tends to lose weight in the summer; used to drink a lot of [facility], mostly water now  Abdominal ultrasound 05/25/2018: IMPRESSION: Liver of slight increased echogenicity which may represent result of mild fatty infiltration and/or hepatocellular disease. No focal hepatic lesion noted.  No gallstones.  Portions of pancreas suboptimally evaluated secondary to bowel gas. The portions which are visualized appear unremarkable.  Otherwise negative exam.   Electronically Signed   By: Lacy Duverney M.D.   On: 05/25/2018 11:08  Chest xray: CLINICAL DATA:  Chest pain.  Hypertension.  EXAM: CHEST - 2 VIEW  COMPARISON:  None.  FINDINGS: Lungs are clear. Heart size and pulmonary vascularity are normal. No adenopathy. There is slight degenerative change in the thoracic spine.  IMPRESSION: No edema or consolidation.   Electronically Signed   By: Bretta Bang III M.D.   On: 05/11/2018 13:17  Depression screen [facility] 2/9 05/11/2018 02/26/2018 11/27/2017 08/27/2017  08/05/2017  Decreased Interest 0 0 0 0 0  Down, Depressed, Hopeless 0 0 0 0 0  PHQ - 2 Score 0 0 0 0 0    Relevant past medical, surgical, family and social history reviewed Past Medical History:  Diagnosis Date  . Diabetes mellitus without complication (HCC)   . Gout    R foot great toe   . Hypercholesteremia 2017  . Hypertension    Past Surgical History:  Procedure Laterality Date  . KNEE SURGERY Right    Family History  Problem Relation Age of Onset  . Hypertension Father   . Diabetes Father   . Liver disease Father   . Diverticulitis Mother   . Migraines Mother   . Cancer Sister        tumors in the breast   . Diabetes Maternal Grandfather   . Cancer Sister        tumors in the breast   . Cancer Sister        tumor in the breast    Social History   Tobacco Use  . Smoking status: Current Every Day Smoker    Packs/day: 1.00    Years: 30.00    Pack years: 30.00    Types: Cigarettes  . Smokeless tobacco: Never Used  Substance Use Topics  . Alcohol use: Yes    Alcohol/week: 0.0 oz    Comment:  none in the last 3 weeks  . Drug use: No    Interim medical history since last visit reviewed. Allergies and medications reviewed  Review of Systems Per HPI unless specifically indicated above     Objective:    BP 128/80   Pulse 95   Temp 98.3 F (36.8 C) (Oral)   Resp 14   Ht 6' (1.829 m)   Wt 225 lb 8 oz (102.3 kg)   SpO2 97%   BMI 30.58 kg/m   Wt Readings from Last 3 Encounters:  05/27/18 225 lb 8 oz (102.3 kg)  05/26/18 227 lb (103 kg)  05/13/18 225 lb 9.6 oz (102.3 kg)    Physical Exam  Constitutional: He appears well-developed and well-nourished. No distress.  Eyes: No scleral icterus.  Cardiovascular: Normal rate and regular rhythm.  Pulmonary/Chest: Effort normal and breath sounds normal.  Neurological: He is alert.  Skin: No pallor.  Psychiatric: He has a normal mood and affect.   Diabetic Foot Form - Detailed   Diabetic Foot Exam -  detailed Diabetic Foot exam was performed with the following findings:  Yes 05/27/2018 10:09 AM  Visual Foot Exam completed.:  Yes  Pulse Foot Exam completed.:  Yes  Right Dorsalis Pedis:  Present Left Dorsalis Pedis:  Present  Sensory Foot Exam Completed.:  Yes Semmes-Weinstein Monofilament Test R Site 1-Great Toe:  Pos L Site 1-Great Toe:  Pos        Results for orders placed or performed during the hospital encounter of 05/26/18  KOH prep  Result Value Ref Range   Specimen Description ESOPHAGUS    Special Requests Normal    KOH Prep      NO YEAST OR FUNGAL ELEMENTS SEEN Performed at [facility], [address] [place], [place] [address]    Report Status 05/26/2018 FINAL   Glucose, capillary  Result Value Ref Range   Glucose-Capillary 153 (H) 70 - 99 mg/dL      Assessment & Plan:   Problem List Items Addressed This Visit      Cardiovascular and Mediastinum   DM (diabetes mellitus), type 2 with peripheral vascular complications (HCC)    Sugars controlled; foot exam by MD today; next A1c in late Nov, early Dec        Digestive   Esophageal leukoplakia    Noted on EGD        Other   Smoker    Encouraged cessation; he is not ready to quit right now; I explained that smoking increases risk of stomach cancer and esphageal cancer; I am here to help if/when he is ready to quit      Obesity (BMI 30.0-34.9)    Encouraged weight loss; try to get BMI under 30 as a start          Follow up plan: Return in about 5 months (around 11/01/2018) for twenty minute follow-up with fasting labs.  An after-visit summary was printed and given to the patient at check-out.  Please see the patient instructions which may contain other information and recommendations beyond what is mentioned above in the assessment and plan.  No orders of the defined types were placed in this encounter.   No orders of the defined types were placed in this encounter.

## 2018-05-27 NOTE — Assessment & Plan Note (Signed)
Sugars controlled; foot exam by MD today; next A1c in late Nov, early Dec

## 2018-05-27 NOTE — Assessment & Plan Note (Signed)
Encouraged cessation; he is not ready to quit right now; I explained that smoking increases risk of stomach cancer and esphageal cancer; I am here to help if/when he is ready to quit

## 2018-05-28 ENCOUNTER — Encounter: Payer: Self-pay | Admitting: Gastroenterology

## 2018-05-31 ENCOUNTER — Encounter: Payer: Self-pay | Admitting: Gastroenterology

## 2018-08-10 ENCOUNTER — Ambulatory Visit: Payer: BLUE CROSS/BLUE SHIELD | Admitting: Family Medicine

## 2018-08-17 ENCOUNTER — Ambulatory Visit: Payer: BLUE CROSS/BLUE SHIELD | Admitting: Gastroenterology

## 2018-08-17 ENCOUNTER — Encounter: Payer: Self-pay | Admitting: Gastroenterology

## 2018-08-17 DIAGNOSIS — K2970 Gastritis, unspecified, without bleeding: Secondary | ICD-10-CM

## 2018-09-29 ENCOUNTER — Ambulatory Visit (INDEPENDENT_AMBULATORY_CARE_PROVIDER_SITE_OTHER): Payer: BLUE CROSS/BLUE SHIELD

## 2018-09-29 DIAGNOSIS — Z23 Encounter for immunization: Secondary | ICD-10-CM | POA: Diagnosis not present

## 2018-10-09 ENCOUNTER — Other Ambulatory Visit: Payer: Self-pay | Admitting: Family Medicine

## 2018-10-10 NOTE — Telephone Encounter (Signed)
Will check lipids at dec appt Last sgpt normal rx approved

## 2018-11-01 ENCOUNTER — Ambulatory Visit: Payer: BLUE CROSS/BLUE SHIELD | Admitting: Family Medicine

## 2018-11-12 ENCOUNTER — Ambulatory Visit (INDEPENDENT_AMBULATORY_CARE_PROVIDER_SITE_OTHER): Payer: Self-pay | Admitting: Family Medicine

## 2018-11-12 ENCOUNTER — Encounter: Payer: Self-pay | Admitting: Family Medicine

## 2018-11-12 VITALS — BP 130/82 | HR 96 | Temp 98.2°F | Ht 72.0 in | Wt 229.4 lb

## 2018-11-12 DIAGNOSIS — E669 Obesity, unspecified: Secondary | ICD-10-CM | POA: Insufficient documentation

## 2018-11-12 DIAGNOSIS — I10 Essential (primary) hypertension: Secondary | ICD-10-CM

## 2018-11-12 DIAGNOSIS — M25512 Pain in left shoulder: Secondary | ICD-10-CM

## 2018-11-12 DIAGNOSIS — E1169 Type 2 diabetes mellitus with other specified complication: Secondary | ICD-10-CM | POA: Insufficient documentation

## 2018-11-12 DIAGNOSIS — E7800 Pure hypercholesterolemia, unspecified: Secondary | ICD-10-CM

## 2018-11-12 DIAGNOSIS — E1151 Type 2 diabetes mellitus with diabetic peripheral angiopathy without gangrene: Secondary | ICD-10-CM

## 2018-11-12 DIAGNOSIS — Z5181 Encounter for therapeutic drug level monitoring: Secondary | ICD-10-CM

## 2018-11-12 MED ORDER — INSULIN DETEMIR 100 UNIT/ML FLEXPEN: 12.0000 [IU] | PEN_INJECTOR | Freq: Every day | SUBCUTANEOUS | Status: DC

## 2018-11-12 NOTE — Assessment & Plan Note (Signed)
Circulation seems fine to me; foot exam by MD today; eye exam encouraged; showed pictures of diabetic retinopathy; check labs today

## 2018-11-12 NOTE — Assessment & Plan Note (Signed)
He'll try to increase activity; limit portions; increase water intake; goal BMI next is less than 30

## 2018-11-12 NOTE — Progress Notes (Signed)
BP 130/82   Pulse 96   Temp 98.2 F (36.8 C) (Oral)   Ht 6' (1.829 m)   Wt 229 lb 6.4 oz (104.1 kg)   SpO2 99%   BMI 31.11 kg/m    Subjective:    Patient ID: Donald [name], male    DOB: 09/17/1966, 52 y.o.   MRN: [id number]  HPI: Donald [name] is a 52 y.o. male  Chief Complaint  Patient presents with  . Follow-up  . Shoulder Pain    left onset 2 weeks    HPI Patient is here for f/u Type 2 diabetes; on insulin just 12 units once a day (at 10 pm); also on metformin; no new problems with feet; bunions doesn't bother him  High cholesterol; tries to limit saturated fats; not much cheese; once a week; used to eat a lot of eggs, but not many now; on 20 mg rosuvastatin  Lab Results  Component Value Date   CHOL 218 (H) 04/29/2018   HDL 40 (L) 04/29/2018   LDLCALC 141 (H) 04/29/2018   TRIG 232 (H) 04/29/2018   CHOLHDL 5.5 (H) 04/29/2018    Hypertension; controlled today; on just 10 mg of lisinopril; depends on time of year; coming to the doctor goes up a little  Obesity; stable  Left shoulder has been bothering him for two weeks; sore to the touch; difficult to move in certain ways  Depression screen [facility] 2/9 11/12/2018 05/11/2018 02/26/2018 11/27/2017 08/27/2017  Decreased Interest 0 0 0 0 0  Down, Depressed, Hopeless 0 0 0 0 0  PHQ - 2 Score 0 0 0 0 0  Altered sleeping 0 - - - -  Tired, decreased energy 0 - - - -  Change in appetite 0 - - - -  Feeling bad or failure about yourself  0 - - - -  Trouble concentrating 0 - - - -  Moving slowly or fidgety/restless 0 - - - -  Suicidal thoughts 0 - - - -  PHQ-9 Score 0 - - - -  Difficult doing work/chores Not difficult at all - - - -   Fall Risk  11/12/2018 05/11/2018 04/29/2018 02/26/2018 11/27/2017  Falls in the past year? 0 No No No No    Relevant past medical, surgical, family and social history reviewed Past Medical History:  Diagnosis Date  . Diabetes mellitus without complication (HCC)   . Gout    R foot great  toe   . Hypercholesteremia 2017  . Hypertension    Past Surgical History:  Procedure Laterality Date  . ESOPHAGOGASTRODUODENOSCOPY (EGD) WITH PROPOFOL N/A 05/26/2018   Procedure: ESOPHAGOGASTRODUODENOSCOPY (EGD) WITH PROPOFOL;  Surgeon: Pasty Spillers, MD;  Location: [facility] ENDOSCOPY;  Service: Endoscopy;  Laterality: N/A;  . KNEE SURGERY Right    Family History  Problem Relation Age of Onset  . Hypertension Father   . Diabetes Father   . Liver disease Father   . Diverticulitis Mother   . Migraines Mother   . Cancer Sister        tumors in the breast   . Diabetes Maternal Grandfather   . Cancer Sister        tumors in the breast   . Cancer Sister        tumor in the breast    Social History   Tobacco Use  . Smoking status: Current Every Day Smoker    Packs/day: 1.00    Years: 30.00    Pack years: 30.00  Types: Cigarettes  . Smokeless tobacco: Never Used  Substance Use Topics  . Alcohol use: Yes    Alcohol/week: 0.0 standard drinks    Comment: none in the last 3 weeks  . Drug use: No     Office Visit from 11/12/2018 in [facility]  AUDIT-C Score  4      Interim medical history since last visit reviewed. Allergies and medications reviewed  Review of Systems Per HPI unless specifically indicated above     Objective:    BP 130/82   Pulse 96   Temp 98.2 F (36.8 C) (Oral)   Ht 6' (1.829 m)   Wt 229 lb 6.4 oz (104.1 kg)   SpO2 99%   BMI 31.11 kg/m   Wt Readings from Last 3 Encounters:  11/12/18 229 lb 6.4 oz (104.1 kg)  05/27/18 225 lb 8 oz (102.3 kg)  05/26/18 227 lb (103 kg)    Physical Exam Constitutional:      General: He is not in acute distress.    Appearance: He is well-developed.  HENT:     Head: Normocephalic and atraumatic.  Eyes:     General: No scleral icterus. Neck:     Thyroid: No thyromegaly.  Cardiovascular:     Rate and Rhythm: Normal rate and regular rhythm.  Pulmonary:     Effort: Pulmonary effort  is normal.     Breath sounds: Normal breath sounds.  Abdominal:     General: Bowel sounds are normal. There is no distension.     Palpations: Abdomen is soft.  Musculoskeletal:     Left shoulder: He exhibits decreased range of motion and tenderness.  Skin:    General: Skin is warm and dry.     Coloration: Skin is not pale.  Neurological:     Coordination: Coordination normal.  Psychiatric:        Behavior: Behavior normal.        Thought Content: Thought content normal.        Judgment: Judgment normal.    Diabetic Foot Form - Detailed   Diabetic Foot Exam - detailed Diabetic Foot exam was performed with the following findings:  Yes 11/12/2018  3:38 PM  Visual Foot Exam completed.:  Yes  Pulse Foot Exam completed.:  Yes  Right Dorsalis Pedis:  Present Left Dorsalis Pedis:  Present  Sensory Foot Exam Completed.:  Yes Semmes-Weinstein Monofilament Test R Site 1-Great Toe:  Pos L Site 1-Great Toe:  Pos           Assessment & Plan:   Problem List Items Addressed This Visit      Cardiovascular and Mediastinum   Hypertension    Controlled today just off by one point (goal syst is less than 130); try to follow DASH guidelines      RESOLVED: DM (diabetes mellitus), type 2 with peripheral vascular complications (HCC) - Primary   Relevant Medications   Insulin Detemir (LEVEMIR FLEXTOUCH) 100 UNIT/ML Pen   Other Relevant Orders   Hemoglobin A1C   Urine Microalbumin w/creat. ratio   Lipid panel     Endocrine   Diabetes mellitus type 2 in obese (HCC)    Circulation seems fine to me; foot exam by MD today; eye exam encouraged; showed pictures of diabetic retinopathy; check labs today      Relevant Medications   Insulin Detemir (LEVEMIR FLEXTOUCH) 100 UNIT/ML Pen     Other   Medication monitoring encounter   Relevant Orders   COMPLETE  METABOLIC PANEL WITH GFR   Obesity (BMI 30.0-34.9)    He'll try to increase activity; limit portions; increase water intake; goal BMI  next is less than 30      Hyperlipidemia    Check lipids; goal LDL is less than 70      Relevant Orders   Lipid panel    Other Visit Diagnoses    Acute pain of left shoulder       symptomatic care; if not improving, refer to ortho       Follow up plan: Return in about 27 weeks (around 05/20/2019) for follow-up visit with Dr. Sherie DonLada (or just AFTER).  An after-visit summary was printed and given to the patient at check-out.  Please see the patient instructions which may contain other information and recommendations beyond what is mentioned above in the assessment and plan.  Meds ordered this encounter  Medications  . Insulin Detemir (LEVEMIR FLEXTOUCH) 100 UNIT/ML Pen    Sig: Inject 12 Units into the skin daily at 10 pm.    Orders Placed This Encounter  Procedures  . Hemoglobin A1C  . Urine Microalbumin w/creat. ratio  . COMPLETE METABOLIC PANEL WITH GFR  . Lipid panel

## 2018-11-12 NOTE — Assessment & Plan Note (Signed)
Check lipids; goal LDL is less than 70 

## 2018-11-12 NOTE — Patient Instructions (Addendum)
Please do see your eye doctor as soon as possible and then see them yearly or as directed Check out the information at familydoctor.org entitled "Nutrition for Weight Loss: What You Need to Know about Fad Diets" Try to lose between 1-2 pounds per week by taking in fewer calories and burning off more calories You can succeed by limiting portions, limiting foods dense in calories and fat, becoming more active, and drinking 8 glasses of water a day (64 ounces) Don't skip meals, especially breakfast, as skipping meals may alter your metabolism Do not use over-the-counter weight loss pills or gimmicks that claim rapid weight loss A healthy BMI (or body mass index) is between 18.5 and 24.9 You can calculate your ideal BMI at the NIH website JobEconomics.hu Try to limit saturated fats in your diet (bologna, hot dogs, barbeque, cheeseburgers, hamburgers, steak, bacon, sausage, cheese, etc.) and get more fresh fruits, vegetables, and whole grains Return next week for labs  Preventing Unhealthy Weight Gain, Adult Staying at a healthy weight is important. When fat builds up in your body, you may become overweight or obese. These conditions put you at greater risk for developing certain health problems, such as heart disease, diabetes, sleeping problems, joint problems, and some cancers. Unhealthy weight gain is often the result of making unhealthy choices in what you eat. It is also a result of not getting enough exercise. You can make changes to your lifestyle to prevent obesity and stay as healthy as possible. What nutrition changes can be made? To maintain a healthy weight and prevent obesity:  Eat only as much as your body needs. To do this: ? Pay attention to signs that you are hungry or full. Stop eating as soon as you feel full. ? If you feel hungry, try drinking water first. Drink enough water so your urine is clear or pale yellow. ? Eat smaller  portions. ? Look at serving sizes on food labels. Most foods contain more than one serving per container. ? Eat the recommended amount of calories for your gender and activity level. While most active people should eat around 2,000 calories per day, if you are trying to lose weight or are not very active, you main need to eat less calories. Talk to your health care provider or dietitian about how many calories you should eat each day.  Choose healthy foods, such as: ? Fruits and vegetables. Try to fill at least half of your plate at each meal with fruits and vegetables. ? Whole grains, such as whole wheat bread, brown rice, and quinoa. ? Lean meats, such as chicken or fish. ? Other healthy proteins, such as beans, eggs, or tofu. ? Healthy fats, such as nuts, seeds, fatty fish, and olive oil. ? Low-fat or fat-free dairy.  Check food labels and avoid food and drinks that: ? Are high in calories. ? Have added sugar. ? Are high in sodium. ? Have saturated fats or trans fats.  Limit how much you eat of the following foods: ? Prepackaged meals. ? Fast food. ? Fried foods. ? Processed meat, such as bacon, sausage, and deli meats. ? Fatty cuts of red meat and poultry with skin.  Cook foods in healthier ways, such as by baking, broiling, or grilling.  When grocery shopping, try to shop around the outside of the store. This helps you buy mostly fresh foods and avoid canned and prepackaged foods.  What lifestyle changes can be made?  Exercise at least 30 minutes 5 or more days  each week. Exercising includes brisk walking, yard work, biking, running, swimming, and team sports like basketball and soccer. Ask your health care provider which exercises are safe for you.  Do not use any products that contain nicotine or tobacco, such as cigarettes and e-cigarettes. If you need help quitting, ask your health care provider.  Limit alcohol intake to no more than 1 drink a day for nonpregnant women and  2 drinks a day for men. One drink equals 12 oz of beer, 5 oz of wine, or 1 oz of hard liquor.  Try to get 7-9 hours of sleep each night. What other changes can be made?  Keep a food and activity journal to keep track of: ? What you ate and how many calories you had. Remember to count sauces, dressings, and side dishes. ? Whether you were active, and what exercises you did. ? Your calorie, weight, and activity goals.  Check your weight regularly. Track any changes. If you notice you have gained weight, make changes to your diet or activity routine.  Avoid taking weight-loss medicines or supplements. Talk to your health care provider before starting any new medicine or supplement.  Talk to your health care provider before trying any new diet or exercise plan. Why are these changes important? Eating healthy, staying active, and having healthy habits not only help prevent obesity, they also:  Help you to manage stress and emotions.  Help you to connect with friends and family.  Improve your self-esteem.  Improve your sleep.  Prevent long-term health problems.  What can happen if changes are not made? Being obese or overweight can cause you to develop joint or bone problems, which can make it hard for you to stay active or do activities you enjoy. Being obese or overweight also puts stress on your heart and lungs and can lead to health problems like diabetes, heart disease, and some cancers. Where to find more information: Talk with your health care provider or a dietitian about healthy eating and healthy lifestyle choices. You may also find other information through these resources:  U.S. Department of Agriculture MyPlate: https://ball-collins.biz/www.choosemyplate.gov  American Heart Association: www.heart.org  Centers for Disease Control and Prevention: FootballExhibition.com.brwww.cdc.gov  Summary  Staying at a healthy weight is important. It helps prevent certain diseases and health problems, such as heart disease,  diabetes, joint problems, sleep disorders, and some cancers.  Being obese or overweight can cause you to develop joint or bone problems, which can make it hard for you to stay active or do activities you enjoy.  You can prevent unhealthy weight gain by eating a healthy diet, exercising regularly, not smoking, limiting alcohol, and getting enough sleep.  Talk with your health care provider or a dietitian for guidance about healthy eating and healthy lifestyle choices. This information is not intended to replace advice given to you by your health care provider. Make sure you discuss any questions you have with your health care provider. Document Released: 11/18/2016 Document Revised: 12/24/2016 Document Reviewed: 12/24/2016 Elsevier Interactive Patient Education  [employer]2018 Elsevier Inc.

## 2018-11-12 NOTE — Assessment & Plan Note (Signed)
Controlled today just off by one point (goal syst is less than 130); try to follow DASH guidelines

## 2018-11-15 DIAGNOSIS — E7800 Pure hypercholesterolemia, unspecified: Secondary | ICD-10-CM | POA: Diagnosis not present

## 2018-11-15 DIAGNOSIS — E1151 Type 2 diabetes mellitus with diabetic peripheral angiopathy without gangrene: Secondary | ICD-10-CM | POA: Diagnosis not present

## 2018-11-15 DIAGNOSIS — Z5181 Encounter for therapeutic drug level monitoring: Secondary | ICD-10-CM | POA: Diagnosis not present

## 2018-11-16 LAB — HEMOGLOBIN A1C
EAG (MMOL/L): 12.4 (calc)
Hgb A1c MFr Bld: 9.4 % of total Hgb — ABNORMAL HIGH (ref <5.7)
Mean Plasma Glucose: 223 (calc)

## 2018-11-16 LAB — COMPLETE METABOLIC PANEL WITH GFR
AG Ratio: 1.2 (calc) (ref 1.0–2.5)
ALBUMIN MSPROF: 4 g/dL (ref 3.6–5.1)
ALKALINE PHOSPHATASE (APISO): 91 U/L (ref 40–115)
ALT: 23 U/L (ref 9–46)
AST: 16 U/L (ref 10–35)
BILIRUBIN TOTAL: 0.2 mg/dL (ref 0.2–1.2)
BUN: 12 mg/dL (ref 7–25)
CHLORIDE: 103 mmol/L (ref 98–110)
CO2: 28 mmol/L (ref 20–32)
CREATININE: 1.15 mg/dL (ref 0.70–1.33)
Calcium: 9.4 mg/dL (ref 8.6–10.3)
GFR, EST AFRICAN AMERICAN: 83 mL/min/{1.73_m2} (ref >=60)
GFR, Est Non African American: 72 mL/min/{1.73_m2} (ref >=60)
GLUCOSE: 215 mg/dL — AB (ref 65–99)
Globulin: 3.4 g/dL (calc) (ref 1.9–3.7)
Potassium: 4.4 mmol/L (ref 3.5–5.3)
Sodium: 141 mmol/L (ref 135–146)
TOTAL PROTEIN: 7.4 g/dL (ref 6.1–8.1)

## 2018-11-16 LAB — LIPID PANEL
CHOL/HDL RATIO: 5.5 (calc) — AB (ref <5.0)
CHOLESTEROL: 205 mg/dL — AB (ref <200)
HDL: 37 mg/dL — ABNORMAL LOW (ref >40)
LDL CHOLESTEROL (CALC): 121 mg/dL — AB
Non-HDL Cholesterol (Calc): 168 mg/dL (calc) — ABNORMAL HIGH (ref <130)
TRIGLYCERIDES: 327 mg/dL — AB (ref <150)

## 2018-11-16 LAB — MICROALBUMIN / CREATININE URINE RATIO
Creatinine, Urine: 349 mg/dL — ABNORMAL HIGH (ref 20–320)
MICROALB UR: 38.9 mg/dL
Microalb Creat Ratio: 111 mcg/mg creat — ABNORMAL HIGH (ref <30)

## 2018-11-19 ENCOUNTER — Other Ambulatory Visit: Payer: Self-pay | Admitting: Family Medicine

## 2018-11-19 DIAGNOSIS — E1151 Type 2 diabetes mellitus with diabetic peripheral angiopathy without gangrene: Secondary | ICD-10-CM

## 2018-11-19 MED ORDER — PIOGLITAZONE HCL 15 MG PO TABS: 15.0000 mg | ORAL_TABLET | Freq: Every day | ORAL | 5 refills | Status: DC

## 2018-11-19 MED ORDER — INSULIN DETEMIR 100 UNIT/ML FLEXPEN: 15.0000 [IU] | PEN_INJECTOR | Freq: Every day | SUBCUTANEOUS | Status: DC

## 2018-11-19 NOTE — Progress Notes (Signed)
Add actos; increase insulin; pt to check sugars and contact me after 3-4 days for further titration

## 2019-04-11 ENCOUNTER — Encounter: Payer: Self-pay | Admitting: Emergency Medicine

## 2019-04-11 ENCOUNTER — Ambulatory Visit: Payer: Self-pay

## 2019-04-11 ENCOUNTER — Ambulatory Visit (INDEPENDENT_AMBULATORY_CARE_PROVIDER_SITE_OTHER): Payer: BLUE CROSS/BLUE SHIELD | Admitting: Family Medicine

## 2019-04-11 ENCOUNTER — Encounter: Payer: Self-pay | Admitting: Family Medicine

## 2019-04-11 ENCOUNTER — Other Ambulatory Visit: Payer: Self-pay

## 2019-04-11 DIAGNOSIS — Z20822 Contact with and (suspected) exposure to covid-19: Secondary | ICD-10-CM

## 2019-04-11 DIAGNOSIS — M791 Myalgia, unspecified site: Secondary | ICD-10-CM | POA: Diagnosis not present

## 2019-04-11 DIAGNOSIS — F17200 Nicotine dependence, unspecified, uncomplicated: Secondary | ICD-10-CM | POA: Diagnosis not present

## 2019-04-11 DIAGNOSIS — E119 Type 2 diabetes mellitus without complications: Secondary | ICD-10-CM | POA: Diagnosis not present

## 2019-04-11 DIAGNOSIS — E1151 Type 2 diabetes mellitus with diabetic peripheral angiopathy without gangrene: Secondary | ICD-10-CM

## 2019-04-11 DIAGNOSIS — Z20828 Contact with and (suspected) exposure to other viral communicable diseases: Secondary | ICD-10-CM

## 2019-04-11 DIAGNOSIS — R42 Dizziness and giddiness: Secondary | ICD-10-CM

## 2019-04-11 DIAGNOSIS — R51 Headache: Secondary | ICD-10-CM | POA: Diagnosis not present

## 2019-04-11 DIAGNOSIS — R6883 Chills (without fever): Secondary | ICD-10-CM | POA: Diagnosis not present

## 2019-04-11 DIAGNOSIS — I10 Essential (primary) hypertension: Secondary | ICD-10-CM | POA: Diagnosis not present

## 2019-04-11 DIAGNOSIS — R197 Diarrhea, unspecified: Secondary | ICD-10-CM | POA: Diagnosis not present

## 2019-04-11 DIAGNOSIS — R05 Cough: Secondary | ICD-10-CM | POA: Diagnosis not present

## 2019-04-11 MED ORDER — INSULIN DETEMIR 100 UNIT/ML FLEXPEN: 18.0000 [IU] | PEN_INJECTOR | Freq: Every day | SUBCUTANEOUS | Status: DC

## 2019-04-11 NOTE — Telephone Encounter (Signed)
Pt c/o elevated BS. Pt stated blood sugars during the weekend ranged for 190 to 232. This morning's BS was 250. Pt takes levemir and Metformin. Pt c/o headache, green stools and occasional lightheadedness.   Care advice given to pt and pt verbalized understanding. Pt email verified and pt informed visit will be virtual     Reason for Disposition . [1] Blood glucose 240 - 300 mg/dL (25.4 - 98.2 mmol/L) AND [2] does not  use insulin (e.g., not insulin-dependent; most people with type 2 diabetes)  Answer Assessment - Initial Assessment Questions 1. BLOOD GLUCOSE: "What is your blood glucose level?"     250- during the weekend 232  210 190 2. ONSET: "When did you check the blood glucose?"     Morning fasting 3. USUAL RANGE: "What is your glucose level usually?" (e.g., usual fasting morning value, usual evening value)     130 4. KETONES: "Do you check for ketones (urine or blood test strips)?" If yes, ask: "What does the test show now?"      no 5. TYPE 1 or 2:  "Do you know what type of diabetes you have?"  (e.g., Type 1, Type 2, Gestational; doesn't know)      Type 2  6. INSULIN: "Do you take insulin?" "What type of insulin(s) do you use? What is the mode of delivery? (syringe, pen (e.g., injection or  pump)?"     Yes- levemir [place] pen 7. DIABETES PILLS: "Do you take any pills for your diabetes?" If yes, ask: "Have you missed taking any pills recently?"    Metformin- no 8. OTHER SYMPTOMS: "Do you have any symptoms?" (e.g., fever, frequent urination, difficulty breathing, dizziness, weakness, vomiting)     Headache, stool green, lightheaded 9. PREGNANCY: "Is there any chance you are pregnant?" "When was your last menstrual period?"     n/a  Protocols used: DIABETES - HIGH BLOOD SUGAR-A-AH

## 2019-04-11 NOTE — Patient Instructions (Addendum)
- Please call [facility]'s COVID-19 hotline at [phone] to determine if you are eligible for testing. - Increase Levimir to 18 units, follow up in 3-4 days.   Diabetes Mellitus and Nutrition, Adult When you have diabetes (diabetes mellitus), it is very important to have healthy eating habits because your blood sugar (glucose) levels are greatly affected by what you eat and drink. Eating healthy foods in the appropriate amounts, at about the same times every day, can help you:  Control your blood glucose.  Lower your risk of heart disease.  Improve your blood pressure.  Reach or maintain a healthy weight. Every person with diabetes is different, and each person has different needs for a meal plan. Your health care provider may recommend that you work with a diet and nutrition specialist (dietitian) to make a meal plan that is best for you. Your meal plan may vary depending on factors such as:  The calories you need.  The medicines you take.  Your weight.  Your blood glucose, blood pressure, and cholesterol levels.  Your activity level.  Other health conditions you have, such as heart or kidney disease. How do carbohydrates affect me? Carbohydrates, also called carbs, affect your blood glucose level more than any other type of food. Eating carbs naturally raises the amount of glucose in your blood. Carb counting is a method for keeping track of how many carbs you eat. Counting carbs is important to keep your blood glucose at a healthy level, especially if you use insulin or take certain oral diabetes medicines. It is important to know how many carbs you can safely have in each meal. This is different for every person. Your dietitian can help you calculate how many carbs you should have at each meal and for each snack. Foods that contain carbs include:  Bread, cereal, rice, pasta, and crackers.  Potatoes and corn.  Peas, beans, and lentils.  Milk and yogurt.  Fruit and  juice.  Desserts, such as cakes, cookies, ice cream, and candy. How does alcohol affect me? Alcohol can cause a sudden decrease in blood glucose (hypoglycemia), especially if you use insulin or take certain oral diabetes medicines. Hypoglycemia can be a life-threatening condition. Symptoms of hypoglycemia (sleepiness, dizziness, and confusion) are similar to symptoms of having too much alcohol. If your health care provider says that alcohol is safe for you, follow these guidelines:  Limit alcohol intake to no more than 1 drink per day for nonpregnant women and 2 drinks per day for men. One drink equals 12 oz of beer, 5 oz of wine, or 1 oz of hard liquor.  Do not drink on an empty stomach.  Keep yourself hydrated with water, diet soda, or unsweetened iced tea.  Keep in mind that regular soda, juice, and other mixers may contain a lot of sugar and must be counted as carbs. What are tips for following this plan?  Reading food labels  Start by checking the serving size on the "Nutrition Facts" label of packaged foods and drinks. The amount of calories, carbs, fats, and other nutrients listed on the label is based on one serving of the item. Many items contain more than one serving per package.  Check the total grams (g) of carbs in one serving. You can calculate the number of servings of carbs in one serving by dividing the total carbs by 15. For example, if a food has 30 g of total carbs, it would be equal to 2 servings of carbs.  Check  the number of grams (g) of saturated and trans fats in one serving. Choose foods that have low or no amount of these fats.  Check the number of milligrams (mg) of salt (sodium) in one serving. Most people should limit total sodium intake to less than 2,300 mg per day.  Always check the nutrition information of foods labeled as "low-fat" or "nonfat". These foods may be higher in added sugar or refined carbs and should be avoided.  Talk to your dietitian to  identify your daily goals for nutrients listed on the label. Shopping  Avoid buying canned, premade, or processed foods. These foods tend to be high in fat, sodium, and added sugar.  Shop around the outside edge of the grocery store. This includes fresh fruits and vegetables, bulk grains, fresh meats, and fresh dairy. Cooking  Use low-heat cooking methods, such as baking, instead of high-heat cooking methods like deep frying.  Cook using healthy oils, such as olive, canola, or sunflower oil.  Avoid cooking with butter, cream, or high-fat meats. Meal planning  Eat meals and snacks regularly, preferably at the same times every day. Avoid going long periods of time without eating.  Eat foods high in fiber, such as fresh fruits, vegetables, beans, and whole grains. Talk to your dietitian about how many servings of carbs you can eat at each meal.  Eat 4-6 ounces (oz) of lean protein each day, such as lean meat, chicken, fish, eggs, or tofu. One oz of lean protein is equal to: ? 1 oz of meat, chicken, or fish. ? 1 egg. ?  cup of tofu.  Eat some foods each day that contain healthy fats, such as avocado, nuts, seeds, and fish. Lifestyle  Check your blood glucose regularly.  Exercise regularly as told by your health care provider. This may include: ? 150 minutes of moderate-intensity or vigorous-intensity exercise each week. This could be brisk walking, biking, or water aerobics. ? Stretching and doing strength exercises, such as yoga or weightlifting, at least 2 times a week.  Take medicines as told by your health care provider.  Do not use any products that contain nicotine or tobacco, such as cigarettes and e-cigarettes. If you need help quitting, ask your health care provider.  Work with a Veterinary surgeon or diabetes educator to identify strategies to manage stress and any emotional and social challenges. Questions to ask a health care provider  Do I need to meet with a diabetes  educator?  Do I need to meet with a dietitian?  What number can I call if I have questions?  When are the best times to check my blood glucose? Where to find more information:  American Diabetes Association: diabetes.org  Academy of Nutrition and Dietetics: www.eatright.[employer] of Diabetes and Digestive and Kidney Diseases (NIH): CarFlippers.tn Summary  A healthy meal plan will help you control your blood glucose and maintain a healthy lifestyle.  Working with a diet and nutrition specialist (dietitian) can help you make a meal plan that is best for you.  Keep in mind that carbohydrates (carbs) and alcohol have immediate effects on your blood glucose levels. It is important to count carbs and to use alcohol carefully. This information is not intended to replace advice given to you by your health care provider. Make sure you discuss any questions you have with your health care provider. Document Released: 08/14/2005 Document Revised: 06/17/2017 Document Reviewed: 12/22/2016 Elsevier Interactive Patient Education  2019 [employer].

## 2019-04-11 NOTE — Progress Notes (Signed)
Name: Donald [name]   MRN: [id number]    DOB: October 19, 1966   Date:04/11/2019       Progress Note  Subjective  Chief Complaint  Chief Complaint  Patient presents with  . Diabetes    elevated BS 250 is highest for 3 days  . 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  I discussed the limitations of evaluation and management by telemedicine and the availability of in person appointments. The patient expressed understanding and agreed to proceed. Staff also discussed with the patient that there may be a patient responsible charge related to this service. Patient Location: Home Provider Location: Home Additional Individuals present: None  HPI  Hyperglycemia/Lightheadedness: Currently Taking Metformin 1044m BID and Levemir 15 units daily (on same dose for about 1 year).  He was also prescribed actos but has not been taking the medication. He also reports headaches and lightheadedness since last week when he noticed his blood sugar spiking from 180 up to 250 today.  He normally runs in the 130's.  He has not changed his diet recently.   He is compliant with his BP meds, but has not been able to check his BP at home.  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  Denies polydipsia, polyuria, or polyphagia.  Patient Active Problem List   Diagnosis Date Noted  . Diabetes mellitus type 2 in obese ([place] 11/12/2018  . 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 Obesity (BMI 30.0-34.9) 05/27/2018  . Edema of esophagus   . Angiodysplasia of stomach and duodenum   . Stomach irritation   . Abdominal pain, epigastric   . Heartburn   . Smoker 05/11/2018  . Medication monitoring encounter 02/26/2018  . Hypertension  09/15/2017  . Ingrown toenail 07/06/2017  . Peripheral vascular insufficiency ([facility] 07/06/2017  . Need for hepatitis C screening test 03/27/2017  . Hyperlipidemia 10/31/2016    Past Surgical History:  Procedure Laterality Date  . ESOPHAGOGASTRODUODENOSCOPY (EGD) WITH PROPOFOL N/A 05/26/2018   Procedure: ESOPHAGOGASTRODUODENOSCOPY (EGD) WITH PROPOFOL;  Surgeon: TVirgel Manifold MD;  Location: [facility] ENDOSCOPY;  Service: Endoscopy;  Laterality: N/A;  . KNEE SURGERY Right     Family History  Problem Relation Age of Onset  . Hypertension Father   . Diabetes Father   . Liver disease Father   . Diverticulitis Mother   . Migraines Mother   . Cancer Sister        tumors in the breast   . Diabetes Maternal Grandfather   . Cancer Sister        tumors in the breast   . Cancer Sister        tumor in the breast     Social History   Socioeconomic History  . Marital status: Married    Spouse name: Not on file  . Number of children: Not on file  . Years of education: Not on file  . Highest education level: Not on file  Occupational History  . Not on file  Social Needs  . Financial resource strain: Not on file  . Food insecurity:    Worry: Not on file    Inability: Not on file  . Transportation needs:  Medical: Not on file    Non-medical: Not on file  Tobacco Use  . Smoking status: Current Every Day Smoker    Packs/day: 1.00    Years: 30.00    Pack years: 30.00    Types: Cigarettes  . Smokeless tobacco: Never Used  Substance and Sexual Activity  . Alcohol use: Yes    Alcohol/week: 0.0 standard drinks    Comment: none in the last 3 weeks  . Drug use: No  . Sexual activity: Yes    Partners: Female  Lifestyle  . Physical activity:    Days per week: Not on file    Minutes per session: Not on file  . Stress: Not on file  Relationships  . Social connections:    Talks on phone: Not on file    Gets together: Not on file    Attends religious service: Not on file     Active member of club or organization: Not on file    Attends meetings of clubs or organizations: Not on file    Relationship status: Not on file  . Intimate partner violence:    Fear of current or ex partner: Not on file    Emotionally abused: Not on file    Physically abused: Not on file    Forced sexual activity: Not on file  Other Topics Concern  . Not on file  Social History Narrative  . Not on file     Current Outpatient Medications:  .  aspirin EC 81 MG EC tablet, Take 1 tablet (81 mg total) by mouth daily., Disp: , Rfl:  .  BD PEN NEEDLE MICRO U/F 32G X 6 MM MISC, USE FOR LEVEMIR INJECTION DAILY, Disp: 100 each, Rfl: 0 .  dicyclomine (BENTYL) 20 MG tablet, Take 20 mg by mouth daily as needed. , Disp: , Rfl:  .  Insulin Detemir (LEVEMIR FLEXTOUCH) 100 UNIT/ML Pen, Inject 15 Units into the skin daily at 10 pm., Disp: 15 mL, Rfl:  .  lisinopril (PRINIVIL,ZESTRIL) 10 MG tablet, Take 1 tablet (10 mg total) by mouth daily., Disp: 90 tablet, Rfl: 3 .  meloxicam (MOBIC) 7.5 MG tablet, TAKE 1 TABLET (7.5 MG TOTAL) BY MOUTH DAILY AS NEEDED FOR PAIN., Disp: 30 tablet, Rfl: 0 .  omeprazole (PRILOSEC) 20 MG capsule, Take 20 mg by mouth daily. , Disp: , Rfl:  .  rosuvastatin (CRESTOR) 20 MG tablet, TAKE 1 TABLET (20 MG TOTAL) BY MOUTH AT BEDTIME. FOR CHOLESTEROL THIS REPLACES SIMVASTATIN, Disp: 90 tablet, Rfl: 0 .  Blood Glucose Monitoring Suppl (D-CARE GLUCOMETER) w/Device KIT, 1 Units by Does not apply route 4 (four) times daily -  before meals and at bedtime., Disp: 1 kit, Rfl: 0 .  metFORMIN (GLUCOPHAGE) 1000 MG tablet, Take 1 tablet (1,000 mg total) by mouth 2 (two) times daily with a meal., Disp: 180 tablet, Rfl: 0 .  pioglitazone (ACTOS) 15 MG tablet, Take 1 tablet (15 mg total) by mouth daily. (Patient not taking: Reported on 04/11/2019), Disp: 30 tablet, Rfl: 5  Allergies  Allergen Reactions  . No Known Allergies     I personally reviewed active problem list, medication list,  allergies, notes from last encounter, lab results with the patient/caregiver today.   ROS Constitutional: Negative for fever or weight change.  Respiratory: Negative for cough and shortness of breath.   Cardiovascular: Negative for chest pain or palpitations.  Gastrointestinal: Negative for abdominal pain, no bowel changes.  Musculoskeletal: Negative for gait problem or joint swelling.  Skin: Negative for rash.  Neurological: Negative for dizziness or headache.  No other specific complaints in a complete review of systems (except as listed in HPI above).   Objective  Virtual encounter, vitals not obtained.  There is no height or weight on file to calculate BMI.  Physical Exam Constitutional: Patient appears well-developed and well-nourished. No distress.  HENT: Head: Normocephalic and atraumatic.  Neck: Normal range of motion. Pulmonary/Chest: Effort normal. No respiratory distress. Speaking in complete sentences Neurological: Pt is alert and oriented to person, place, and time. Coordination, speech are normal.  Psychiatric: Patient has a normal mood and affect. behavior is normal. Judgment and thought content normal.  No results found for this or any previous visit (from the past 72 hour(s)).  PHQ2/9: Depression screen [facility] 2/9 04/11/2019 11/12/2018 05/11/2018 02/26/2018 11/27/2017  Decreased Interest 0 0 0 0 0  Down, Depressed, Hopeless 0 0 0 0 0  PHQ - 2 Score 0 0 0 0 0  Altered sleeping 0 0 - - -  Tired, decreased energy 0 0 - - -  Change in appetite 0 0 - - -  Feeling bad or failure about yourself  0 0 - - -  Trouble concentrating 0 0 - - -  Moving slowly or fidgety/restless 0 0 - - -  Suicidal thoughts 0 0 - - -  PHQ-9 Score 0 0 - - -  Difficult doing work/chores Not difficult at all Not difficult at all - - -   PHQ-2/9 Result is negative.    Fall Risk: Fall Risk  04/11/2019 11/12/2018 05/11/2018 04/29/2018 02/26/2018  Falls in the past year? 0 0 No No No  Number falls  in past yr: 0 - - - -  Injury with Fall? 0 - - - -  Follow up Falls evaluation completed - - - -    Assessment & Plan  1. DM (diabetes mellitus), type 2 with peripheral vascular complications (HCC) - Increase Levemir to 18 units, check BG's 2-3 times daily and record.  Decrease carbohydrate intake and increase fluids. - Insulin Detemir (LEVEMIR FLEXTOUCH) 100 UNIT/ML Pen; Inject 18 Units into the skin daily at 10 pm.  Dispense: 15 mL  2. Essential hypertension - Continue current regimen.  3. Episodic lightheadedness - Unclear if related to spike in BG's or possible COVID19 exposure, will get labs later in the week if not improving with increase in [place]  4. Exposure to Covid-19 Virus Advised to contact [facility] community testing hotline for possible testing to determine if this could be contributing to his symptoms.    -Red flags and when to present for emergency care or RTC including fever >101.75F, chest pain, shortness of breath, new/worsening/un-resolving symptoms, near-syncope, reviewed with patient at time of visit. Follow up and care instructions discussed and provided in AVS.  I discussed the assessment and treatment plan with the patient. The patient was provided an opportunity to ask questions and all were answered. The patient agreed with the plan and demonstrated an understanding of the instructions.  The patient was advised to call back or seek an in-person evaluation if the symptoms worsen or if the condition fails to improve as anticipated.  I provided 16 minutes of non-face-to-face time during this encounter.

## 2019-04-15 ENCOUNTER — Encounter: Payer: Self-pay | Admitting: Emergency Medicine

## 2019-04-15 ENCOUNTER — Encounter: Payer: Self-pay | Admitting: Family Medicine

## 2019-04-15 ENCOUNTER — Ambulatory Visit (INDEPENDENT_AMBULATORY_CARE_PROVIDER_SITE_OTHER): Payer: BLUE CROSS/BLUE SHIELD | Admitting: Family Medicine

## 2019-04-15 ENCOUNTER — Other Ambulatory Visit: Payer: Self-pay

## 2019-04-15 VITALS — BP 128/84 | HR 89 | Temp 98.1°F | Resp 16 | Ht 72.0 in | Wt 223.8 lb

## 2019-04-15 DIAGNOSIS — I10 Essential (primary) hypertension: Secondary | ICD-10-CM

## 2019-04-15 DIAGNOSIS — Z8679 Personal history of other diseases of the circulatory system: Secondary | ICD-10-CM

## 2019-04-15 DIAGNOSIS — E7800 Pure hypercholesterolemia, unspecified: Secondary | ICD-10-CM | POA: Diagnosis not present

## 2019-04-15 DIAGNOSIS — R42 Dizziness and giddiness: Secondary | ICD-10-CM | POA: Diagnosis not present

## 2019-04-15 DIAGNOSIS — E1151 Type 2 diabetes mellitus with diabetic peripheral angiopathy without gangrene: Secondary | ICD-10-CM | POA: Diagnosis not present

## 2019-04-15 MED ORDER — INSULIN DETEMIR 100 UNIT/ML FLEXPEN: 20.0000 [IU] | PEN_INJECTOR | Freq: Every day | SUBCUTANEOUS | Status: DC

## 2019-04-15 NOTE — Progress Notes (Signed)
Name: Donald [name]   MRN: [id number]    DOB: [birth year]   Date:04/15/2019       Progress Note  Subjective  Chief Complaint  Chief Complaint  Patient presents with  . Follow-up    4 day recheck    HPI  Pt presents for 4 day follow up on lightheadness and headaches.  At last visit he was told to increase his Levemir to 18 units.  He has done so and BG's have been coming down a bit - highest has been 180 which was yesterday.  We will increase to 20 units today, due for labs.  Will add on labs to evaluate for lightheadedness as well.   - He is still having lightheadedness occasionally - he states it happens randomly - when sitting or standing, when at rest or with exertion, lasts for about 30 seconds.  He does note today that he had an irregular heartbeat when he was younger, and he used to take medication for this, but he hasn't needed anything in many years. - He did have COVID-19 testing performed through [facility] which was negative.  He was still instructed to remain OOW x7 days since symptom onset, afebrile x3 days, and symptoms improving.  -  He has not changed his diet recently.   He is compliant with his BP meds, and he is at goal today. Denies polydipsia, polyuria, or polyphagia. - Denies dark and tarry stools or BRB in stools.  No hematuria, abdominal pain, shortness of breath or chest pain.   Patient Active Problem List   Diagnosis Date Noted  . Diabetes mellitus type 2 in obese ([place]) 11/12/2018  . 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 Obesity (BMI 30.0-34.9) 05/27/2018  . Edema of esophagus   . Angiodysplasia of stomach and duodenum   . Stomach irritation   . Abdominal pain, epigastric   . Heartburn   . Smoker 05/11/2018  . Medication monitoring encounter 02/26/2018  . Hypertension 09/15/2017  . Ingrown toenail 07/06/2017  . Peripheral vascular insufficiency ([place]) 07/06/2017  . Need for hepatitis C screening test 03/27/2017  . Hyperlipidemia 10/31/2016    Social History    Tobacco Use  . Smoking status: Current Every Day Smoker    Packs/day: 1.00    Years: 30.00    Pack years: 30.00    Types: Cigarettes  . Smokeless tobacco: Never Used  Substance Use Topics  . Alcohol use: Yes    Alcohol/week: 0.0 standard drinks    Comment: none in the last 3 weeks     Current Outpatient Medications:  .  aspirin EC 81 MG EC tablet, Take 1 tablet (81 mg total) by mouth daily., Disp: , Rfl:  .  BD PEN NEEDLE MICRO U/F 32G X 6 MM MISC, USE FOR LEVEMIR INJECTION DAILY, Disp: 100 each, Rfl: 0 .  Blood Glucose Monitoring Suppl (D-CARE GLUCOMETER) w/Device KIT, 1 Units by Does not apply route 4 (four) times daily -  before meals and at bedtime., Disp: 1 kit, Rfl: 0 .  dicyclomine (BENTYL) 20 MG tablet, Take 20 mg by mouth daily as needed. , Disp: , Rfl:  .  Insulin Detemir (LEVEMIR FLEXTOUCH) 100 UNIT/ML Pen, Inject 18 Units into the skin daily at 10 pm., Disp: 15 mL, Rfl:  .  lisinopril (PRINIVIL,ZESTRIL) 10 MG tablet, Take 1 tablet (10 mg total) by mouth daily., Disp: 90 tablet, Rfl: 3 .  meloxicam (MOBIC) 7.5 MG tablet, TAKE 1 TABLET (7.5 MG TOTAL) BY MOUTH DAILY AS  NEEDED FOR PAIN., Disp: 30 tablet, Rfl: 0 .  metFORMIN (GLUCOPHAGE) 1000 MG tablet, Take 1 tablet (1,000 mg total) by mouth 2 (two) times daily with a meal., Disp: 180 tablet, Rfl: 0 .  omeprazole (PRILOSEC) 20 MG capsule, Take 20 mg by mouth daily. , Disp: , Rfl:  .  rosuvastatin (CRESTOR) 20 MG tablet, TAKE 1 TABLET (20 MG TOTAL) BY MOUTH AT BEDTIME. FOR CHOLESTEROL THIS REPLACES SIMVASTATIN, Disp: 90 tablet, Rfl: 0  Allergies  Allergen Reactions  . No Known Allergies     I personally reviewed active problem list, medication list, allergies, notes from last encounter, lab results with the patient/caregiver today.  ROS  Constitutional: Negative for fever or weight change.  Respiratory: Negative for cough and shortness of breath.   Cardiovascular: Negative for chest pain or palpitations.   Gastrointestinal: Negative for abdominal pain, no bowel changes.  Musculoskeletal: Negative for gait problem or joint swelling.  Skin: Negative for rash.  Neurological: Negative for dizziness or headache. Positive for lightheadedness No other specific complaints in a complete review of systems (except as listed in HPI above).  Objective  Vitals:   04/15/19 0800  BP: 128/84  Pulse: 89  Resp: 16  Temp: 98.1 F (36.7 C)  TempSrc: Oral  SpO2: 96%  Weight: 223 lb 12.8 oz (101.5 kg)  Height: 6' (1.829 m)   Body mass index is 30.35 kg/m.  Nursing Note and Vital Signs reviewed.  Physical Exam  Constitutional: Patient appears well-developed and well-nourished. No distress.  HENT: Head: Normocephalic and atraumatic. Eyes: Conjunctivae and EOM are normal. No scleral icterus.  Pupils are equal, round, and reactive to light.  Neck: Normal range of motion. Neck supple. No JVD present. No thyromegaly present.  Cardiovascular: Normal rate, regular rhythm and normal heart sounds.  No murmur heard. No BLE edema. Pulmonary/Chest: Effort normal and breath sounds normal. No respiratory distress. Musculoskeletal: Normal range of motion, no joint effusions. No gross deformities Neurological: Pt is alert and oriented to person, place, and time. No cranial nerve deficit. Coordination, balance, strength, speech and gait are normal.  Skin: Skin is warm and dry. No rash noted. No erythema.  Psychiatric: Patient has a normal mood and affect. behavior is normal. Judgment and thought content normal.  No results found for this or any previous visit (from the past 72 hour(s)).  Assessment & Plan  1. DM (diabetes mellitus), type 2 with peripheral vascular complications ([place]) - He will slowly titrate levemir up to gain better control on BG's. - Insulin Detemir (LEVEMIR FLEXTOUCH) 100 UNIT/ML Pen; Inject 20 Units into the skin daily.  Dispense: 15 mL - COMPLETE METABOLIC PANEL WITH GFR - Hemoglobin A1c   2. Essential hypertension - TSH - COMPLETE METABOLIC PANEL WITH GFR  3. Episodic lightheadedness - TSH - COMPLETE METABOLIC PANEL WITH GFR - CBC with Differential/Platelet - Hemoglobin A1c - EKG  4. Pure hypercholesterolemia - Lipid panel  5. History of irregular heartbeat - Ambulatory referral to Cardiology  -Red flags and when to present for emergency care or RTC including fever >101.30F, chest pain, shortness of breath, new/worsening/un-resolving symptoms, reviewed with patient at time of visit. Follow up and care instructions discussed and provided in AVS.

## 2019-04-15 NOTE — Patient Instructions (Signed)
Go Up by 2 units every 4 days until blood sugar is between 100-140.

## 2019-04-16 LAB — CBC WITH DIFFERENTIAL/PLATELET
Absolute Monocytes: 931 cells/uL (ref 200–950)
Basophils Absolute: 58 cells/uL (ref 0–200)
Basophils Relative: 0.6 %
Eosinophils Absolute: 192 cells/uL (ref 15–500)
Eosinophils Relative: 2 %
HCT: 43.7 % (ref 38.5–50.0)
Hemoglobin: 15.3 g/dL (ref 13.2–17.1)
Lymphs Abs: 2784 cells/uL (ref 850–3900)
MCH: 33.3 pg — ABNORMAL HIGH (ref 27.0–33.0)
MCHC: 35 g/dL (ref 32.0–36.0)
MCV: 95 fL (ref 80.0–100.0)
MPV: 12.2 fL (ref 7.5–12.5)
Monocytes Relative: 9.7 %
Neutro Abs: 5635 cells/uL (ref 1500–7800)
Neutrophils Relative %: 58.7 %
Platelets: 262 10*3/uL (ref 140–400)
RBC: 4.6 10*6/uL (ref 4.20–5.80)
RDW: 13.1 % (ref 11.0–15.0)
Total Lymphocyte: 29 %
WBC: 9.6 10*3/uL (ref 3.8–10.8)

## 2019-04-16 LAB — LIPID PANEL
Cholesterol: 206 mg/dL — ABNORMAL HIGH (ref <200)
HDL: 44 mg/dL (ref >=40)
LDL Cholesterol (Calc): 128 mg/dL (calc) — ABNORMAL HIGH
Non-HDL Cholesterol (Calc): 162 mg/dL (calc) — ABNORMAL HIGH (ref <130)
Total CHOL/HDL Ratio: 4.7 (calc) (ref <5.0)
Triglycerides: 204 mg/dL — ABNORMAL HIGH (ref <150)

## 2019-04-16 LAB — COMPLETE METABOLIC PANEL WITH GFR
AG Ratio: 1.2 (calc) (ref 1.0–2.5)
ALT: 23 U/L (ref 9–46)
AST: 19 U/L (ref 10–35)
Albumin: 4.1 g/dL (ref 3.6–5.1)
Alkaline phosphatase (APISO): 76 U/L (ref 35–144)
BUN: 11 mg/dL (ref 7–25)
CO2: 29 mmol/L (ref 20–32)
Calcium: 9.5 mg/dL (ref 8.6–10.3)
Chloride: 102 mmol/L (ref 98–110)
Creat: 0.88 mg/dL (ref 0.70–1.33)
GFR, Est African American: 113 mL/min/{1.73_m2} (ref >=60)
GFR, Est Non African American: 97 mL/min/{1.73_m2} (ref >=60)
Globulin: 3.3 g/dL (calc) (ref 1.9–3.7)
Glucose, Bld: 174 mg/dL — ABNORMAL HIGH (ref 65–99)
Potassium: 4.3 mmol/L (ref 3.5–5.3)
Sodium: 139 mmol/L (ref 135–146)
Total Bilirubin: 0.3 mg/dL (ref 0.2–1.2)
Total Protein: 7.4 g/dL (ref 6.1–8.1)

## 2019-04-16 LAB — HEMOGLOBIN A1C
Hgb A1c MFr Bld: 9.7 % of total Hgb — ABNORMAL HIGH (ref <5.7)
Mean Plasma Glucose: 232 (calc)
eAG (mmol/L): 12.8 (calc)

## 2019-04-16 LAB — TSH: TSH: 0.73 mIU/L (ref 0.40–4.50)

## 2019-04-18 ENCOUNTER — Other Ambulatory Visit: Payer: Self-pay | Admitting: Family Medicine

## 2019-04-18 DIAGNOSIS — E7800 Pure hypercholesterolemia, unspecified: Secondary | ICD-10-CM

## 2019-04-18 DIAGNOSIS — Z9189 Other specified personal risk factors, not elsewhere classified: Secondary | ICD-10-CM

## 2019-04-18 DIAGNOSIS — E782 Mixed hyperlipidemia: Secondary | ICD-10-CM

## 2019-04-18 DIAGNOSIS — E1151 Type 2 diabetes mellitus with diabetic peripheral angiopathy without gangrene: Secondary | ICD-10-CM

## 2019-04-18 MED ORDER — ICOSAPENT ETHYL 1 G PO CAPS: 2.0000 g | ORAL_CAPSULE | Freq: Two times a day (BID) | ORAL | 1 refills | Status: DC

## 2019-04-19 ENCOUNTER — Other Ambulatory Visit: Payer: Self-pay | Admitting: Family Medicine

## 2019-04-19 DIAGNOSIS — E1151 Type 2 diabetes mellitus with diabetic peripheral angiopathy without gangrene: Secondary | ICD-10-CM

## 2019-04-20 DIAGNOSIS — I499 Cardiac arrhythmia, unspecified: Secondary | ICD-10-CM | POA: Diagnosis not present

## 2019-04-20 DIAGNOSIS — I208 Other forms of angina pectoris: Secondary | ICD-10-CM | POA: Diagnosis not present

## 2019-04-20 DIAGNOSIS — I10 Essential (primary) hypertension: Secondary | ICD-10-CM | POA: Diagnosis not present

## 2019-04-20 DIAGNOSIS — R011 Cardiac murmur, unspecified: Secondary | ICD-10-CM | POA: Diagnosis not present

## 2019-04-20 MED ORDER — INSULIN DETEMIR 100 UNIT/ML FLEXPEN: 20.0000 [IU] | PEN_INJECTOR | Freq: Every day | SUBCUTANEOUS | Status: DC

## 2019-04-20 MED ORDER — MELOXICAM 7.5 MG PO TABS: 7.5000 mg | ORAL_TABLET | Freq: Every day | ORAL | 0 refills | Status: DC | PRN

## 2019-04-20 MED ORDER — METFORMIN HCL 1000 MG PO TABS: 1000.0000 mg | ORAL_TABLET | Freq: Two times a day (BID) | ORAL | 0 refills | Status: DC

## 2019-04-20 MED ORDER — ROSUVASTATIN CALCIUM 20 MG PO TABS: 20.0000 mg | ORAL_TABLET | Freq: Every day | ORAL | 0 refills | Status: DC

## 2019-04-20 NOTE — Telephone Encounter (Signed)
Can you please call patient and ask about Ozempic questions? He has not answered via Mychart yet. Thanks!

## 2019-04-25 ENCOUNTER — Encounter: Payer: Self-pay | Admitting: Family Medicine

## 2019-04-28 ENCOUNTER — Telehealth: Payer: Self-pay | Admitting: Family Medicine

## 2019-04-28 NOTE — Telephone Encounter (Signed)
Called patient back states he had holter monitor placed end of last week and returned this week, has not heard results yet and has stress next Monday.  He works Advertising account executive and all weekend and has still been feeling lightheaded and states its worse at work in [employer] with the heat.  Can you write him out of work until Monday?

## 2019-04-28 NOTE — Telephone Encounter (Signed)
Copied from CRM [id number]. Topic: Quick Communication - See Telephone Encounter >> Apr 28, 2019  2:53 PM Jay Schlichter wrote: CRM for notification. See Telephone encounter for: 04/28/19. Tpt would like a call back regarding the way he is feeling and needs answers.  He would not disclose any more. He is asking for call back from [place] Thanks [phone]

## 2019-04-29 ENCOUNTER — Encounter: Payer: Self-pay | Admitting: Family Medicine

## 2019-04-29 NOTE — Telephone Encounter (Signed)
Pt.notified

## 2019-04-29 NOTE — Telephone Encounter (Signed)
Copied from CRM [id number]. Topic: General - Other >> Apr 29, 2019  1:15 PM Riesa Pope, [place] wrote: Message sent to [facility] I called patient and he only want to speak to her

## 2019-04-29 NOTE — Telephone Encounter (Signed)
Patient sent CRM and I returned his call and he only want to speak to you

## 2019-04-29 NOTE — Telephone Encounter (Signed)
Please write patient out of work until cleared by Cardiology next week after he receives holter monitor results.  If he needs anything else from me, please let me know. Thanks!

## 2019-05-05 ENCOUNTER — Telehealth: Payer: Self-pay

## 2019-05-05 DIAGNOSIS — I499 Cardiac arrhythmia, unspecified: Secondary | ICD-10-CM | POA: Diagnosis not present

## 2019-05-05 DIAGNOSIS — I208 Other forms of angina pectoris: Secondary | ICD-10-CM | POA: Diagnosis not present

## 2019-05-05 DIAGNOSIS — R011 Cardiac murmur, unspecified: Secondary | ICD-10-CM | POA: Diagnosis not present

## 2019-05-05 NOTE — Telephone Encounter (Signed)
Pt stopped by office today.  He is still having hard time, he is doing stress test today and has already done holter monitor with no results.  He states while doing stress test he has felt like several times passing out, but has not.  He has informed medical staff there.  At this point he just wants answers.  I told him we would have to wait on results to see if there is any explanation as to why he is feeling this way.  I told him if he feels really bad he did needed to go to ER.  He states there is no way he can return back to work until he has answers especially working in a Psychologist, counselling, so he wants to see if you can provide him with FLMA?

## 2019-05-05 NOTE — Telephone Encounter (Signed)
I can do FMLA for his time thus far, and will advise that he needs to have Dr. Juliann Pares clear him for return to work.

## 2019-05-05 NOTE — Telephone Encounter (Signed)
Pt.notified

## 2019-05-12 DIAGNOSIS — I10 Essential (primary) hypertension: Secondary | ICD-10-CM | POA: Diagnosis not present

## 2019-05-12 DIAGNOSIS — I499 Cardiac arrhythmia, unspecified: Secondary | ICD-10-CM | POA: Diagnosis not present

## 2019-05-12 DIAGNOSIS — R51 Headache: Secondary | ICD-10-CM | POA: Diagnosis not present

## 2019-05-12 DIAGNOSIS — R011 Cardiac murmur, unspecified: Secondary | ICD-10-CM | POA: Diagnosis not present

## 2019-05-19 ENCOUNTER — Telehealth: Payer: Self-pay | Admitting: Family Medicine

## 2019-05-19 ENCOUNTER — Other Ambulatory Visit: Payer: Self-pay

## 2019-05-19 ENCOUNTER — Encounter: Payer: Self-pay | Admitting: Family Medicine

## 2019-05-19 ENCOUNTER — Ambulatory Visit: Payer: BLUE CROSS/BLUE SHIELD | Admitting: Family Medicine

## 2019-05-19 ENCOUNTER — Ambulatory Visit (INDEPENDENT_AMBULATORY_CARE_PROVIDER_SITE_OTHER): Payer: BC Managed Care – PPO | Admitting: Family Medicine

## 2019-05-19 VITALS — BP 138/78 | HR 96 | Temp 98.6°F | Resp 16 | Ht 72.0 in | Wt 222.3 lb

## 2019-05-19 DIAGNOSIS — E669 Obesity, unspecified: Secondary | ICD-10-CM | POA: Diagnosis not present

## 2019-05-19 DIAGNOSIS — K3189 Other diseases of stomach and duodenum: Secondary | ICD-10-CM

## 2019-05-19 DIAGNOSIS — I10 Essential (primary) hypertension: Secondary | ICD-10-CM

## 2019-05-19 DIAGNOSIS — E1151 Type 2 diabetes mellitus with diabetic peripheral angiopathy without gangrene: Secondary | ICD-10-CM

## 2019-05-19 DIAGNOSIS — G4452 New daily persistent headache (NDPH): Secondary | ICD-10-CM | POA: Diagnosis not present

## 2019-05-19 DIAGNOSIS — E7800 Pure hypercholesterolemia, unspecified: Secondary | ICD-10-CM

## 2019-05-19 LAB — GLUCOSE, POCT (MANUAL RESULT ENTRY): POC Glucose: 205 mg/dl — AB (ref 70–99)

## 2019-05-19 MED ORDER — LEVEMIR FLEXTOUCH 100 UNIT/ML ~~LOC~~ SOPN: 22.0000 [IU] | PEN_INJECTOR | Freq: Every day | SUBCUTANEOUS | Status: DC

## 2019-05-19 NOTE — Telephone Encounter (Signed)
Copied from [place] [id number]. Topic: General - Inquiry >> May 19, 2019  2:53 PM Mathis Bud wrote: Reason for CRM: Dani called from [facility] pre service center.  Patient needs PA for his CT scan 6/22 Dani call back [phone] ext : [phone]

## 2019-05-19 NOTE — Progress Notes (Signed)
Name: Donald [name]   MRN: [id number]    DOB: Jan 19, 1966   Date:05/20/2019       Progress Note  Subjective  Chief Complaint  Chief Complaint  Patient presents with  . Headache    feels like he is about to pass out,     HPI  Headaches and Lightheadedness: He has been experiencing this for over a month now.  He also noted history of irregular heart rhythm which prompted a referral to cardiology.  - He is being evaluated by Dr. Clayborn Bigness.  Had NM stress test done 05/05/2019 - during which time he did develop excessive diaphoresis and dizziness; Left ventricular cavity was normal, normal myocardial thickening and wall motion is noted.  Had echo 05/05/2019 which showed normal ventricular systolic function bilaterally, mild valvular regurgitation, no valvular stenosisi, and EF of 50%.  - Headaches - he is getting his headaches are now daily.  He is waking up with headache in the morning, does not wake him in the middle of the night.  Upon waking and sitting up, the headache causes some blurred vision, the pain worsens, then after sitting for a few minutes, he is able to get up and out of bed as the pain and blurred vision improve. The pain and blurred vision and lightheadedness will return throughout the day randomly - will last 2-3 minutes at a time. He is pain free between episodes, he is now having 3-4 episodes a day.   - He has been working on pushing fluids and this has not helped his symptoms.  - I will provide FMLA for the patient until he has neurology appointment as I do not think that he is capable of performing his job duties at this time. 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   DM: BG's have been coming down to the 160's since increasing insulin to 20units daily.   He was historically well controlled, but did have a spike in December 2019 and remained high at 9.7% on 04/15/2019.  We will increase  his levemir to 22 units.  Also on Metformin max dose.  His diet has been healthy for the most part - not eating a lot of carbs or sweets.  Taking lisinopril, aspirin, and vascepa.  Has not been taking crestor, but will start now.  HLD: Taking Vascepa but never started crestor.  Will fill today.  Denies chest pain, shortness of breath.  No palpitations felt.   GERD: Taking omeprazole and bentyl PRN only.  Doing well, no concerns today.  Patient Active Problem List   Diagnosis Date Noted  . Diabetes mellitus type 2 in obese ([place]) 11/12/2018  . 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 Obesity (BMI 30.0-34.9) 05/27/2018  . Edema of esophagus   . Angiodysplasia of stomach and duodenum   . Stomach irritation   . Heartburn   . Smoker 05/11/2018  . Medication monitoring encounter 02/26/2018  . Hypertension 09/15/2017  . Ingrown toenail 07/06/2017  . Peripheral vascular insufficiency ([place]) 07/06/2017  . Need for hepatitis C screening test 03/27/2017  . Hyperlipidemia 10/31/2016    Past Surgical History:  Procedure Laterality Date  . ESOPHAGOGASTRODUODENOSCOPY (EGD) WITH PROPOFOL N/A 05/26/2018   Procedure: ESOPHAGOGASTRODUODENOSCOPY (EGD) WITH PROPOFOL;  Surgeon: Virgel Manifold, MD;  Location: [facility] ENDOSCOPY;  Service: Endoscopy;  Laterality: N/A;  . KNEE SURGERY Right     Family History  Problem Relation Age of  Onset  . Hypertension Father   . Diabetes Father   . Liver disease Father   . Diverticulitis Mother   . Migraines Mother   . Cancer Sister        tumors in the breast   . Diabetes Maternal Grandfather   . Cancer Sister        tumors in the breast   . Cancer Sister        tumor in the breast     Social History   Socioeconomic History  . Marital status: Married    Spouse name: Not on file  . Number of children: Not on file  . Years of education: Not on file  . Highest education level: Not on file  Occupational History  . Not on file  Social Needs  . Financial  resource strain: Not on file  . Food insecurity    Worry: Not on file    Inability: Not on file  . Transportation needs    Medical: Not on file    Non-medical: Not on file  Tobacco Use  . Smoking status: Current Every Day Smoker    Packs/day: 1.00    Years: 30.00    Pack years: 30.00    Types: Cigarettes  . Smokeless tobacco: Never Used  Substance and Sexual Activity  . Alcohol use: Yes    Alcohol/week: 0.0 standard drinks    Comment: none in the last 3 weeks  . Drug use: No  . Sexual activity: Yes    Partners: Female  Lifestyle  . Physical activity    Days per week: Not on file    Minutes per session: Not on file  . Stress: Not on file  Relationships  . Social Herbalist on phone: Not on file    Gets together: Not on file    Attends religious service: Not on file    Active member of club or organization: Not on file    Attends meetings of clubs or organizations: Not on file    Relationship status: Not on file  . Intimate partner violence    Fear of current or ex partner: Not on file    Emotionally abused: Not on file    Physically abused: Not on file    Forced sexual activity: Not on file  Other Topics Concern  . Not on file  Social History Narrative  . Not on file     Current Outpatient Medications:  .  aspirin EC 81 MG EC tablet, Take 1 tablet (81 mg total) by mouth daily., Disp: , Rfl:  .  BD PEN NEEDLE MICRO U/F 32G X 6 MM MISC, USE FOR LEVEMIR INJECTION DAILY, Disp: 100 each, Rfl: 0 .  Blood Glucose Monitoring Suppl (D-CARE GLUCOMETER) w/Device KIT, 1 Units by Does not apply route 4 (four) times daily -  before meals and at bedtime., Disp: 1 kit, Rfl: 0 .  dicyclomine (BENTYL) 20 MG tablet, Take 20 mg by mouth daily as needed. , Disp: , Rfl:  .  Icosapent Ethyl 1 g CAPS, Take 2 capsules (2 g total) by mouth 2 (two) times a day., Disp: 360 capsule, Rfl: 1 .  Insulin Detemir (LEVEMIR FLEXTOUCH) 100 UNIT/ML Pen, Inject 22 Units into the skin daily.,  Disp: 15 mL, Rfl:  .  lisinopril (PRINIVIL,ZESTRIL) 10 MG tablet, Take 1 tablet (10 mg total) by mouth daily., Disp: 90 tablet, Rfl: 3 .  meloxicam (MOBIC) 7.5 MG tablet, Take 1 tablet (7.5  mg total) by mouth daily as needed for pain., Disp: 30 tablet, Rfl: 0 .  metFORMIN (GLUCOPHAGE) 1000 MG tablet, Take 1 tablet (1,000 mg total) by mouth 2 (two) times daily with a meal., Disp: 180 tablet, Rfl: 0 .  omeprazole (PRILOSEC) 20 MG capsule, Take 20 mg by mouth daily. , Disp: , Rfl:  .  rosuvastatin (CRESTOR) 20 MG tablet, Take 1 tablet (20 mg total) by mouth at bedtime. For cholesterol; this replaces simvastatin, Disp: 90 tablet, Rfl: 0  Allergies  Allergen Reactions  . No Known Allergies     I personally reviewed active problem list, medication list, allergies, notes from last encounter, lab results with the patient/caregiver today.   ROS Ten systems reviewed and is negative except as mentioned in HPI  Objective  Vitals:   05/19/19 0756 05/19/19 0838  BP: (!) 148/84 138/78  Pulse: 96   Resp: 16   Temp: 98.6 F (37 C)   TempSrc: Oral   SpO2: 94%   Weight: 222 lb 4.8 oz (100.8 kg)   Height: 6' (1.829 m)    Body mass index is 30.15 kg/m.  Physical Exam  Constitutional: Patient appears well-developed and well-nourished. No distress.  HENT: Head: Normocephalic and atraumatic. Ears: bilateral TMs with no erythema or effusion; Nose: Nose normal. Eyes: Conjunctivae and EOM are normal. No scleral icterus. Neck: Normal range of motion. Neck supple. No JVD present. Cardiovascular: Normal rate, regular rhythm and normal heart sounds.  No murmur heard. No BLE edema. Pulmonary/Chest: Effort normal and breath sounds normal. No respiratory distress. Musculoskeletal: Normal range of motion, no joint effusions. No gross deformities Neurological: Pt is alert and oriented to person, place, and time. No cranial nerve deficit. Coordination, balance, strength, speech and gait are normal.  Skin:  Skin is warm and dry. No rash noted. No erythema.  Psychiatric: Patient has a normal mood and affect. behavior is normal. Judgment and thought content normal.  Results for orders placed or performed in visit on 05/19/19 (from the past 72 hour(s))  POCT glucose (manual entry)     Status: Abnormal   Collection Time: 05/19/19  8:39 AM  Result Value Ref Range   POC Glucose 205 (A) 70 - 99 mg/dl    PHQ2/9: Depression screen [facility] 2/9 05/19/2019 04/15/2019 04/11/2019 11/12/2018 05/11/2018  Decreased Interest 0 0 0 0 0  Down, Depressed, Hopeless 0 0 0 0 0  PHQ - 2 Score 0 0 0 0 0  Altered sleeping 0 0 0 0 -  Tired, decreased energy 0 0 0 0 -  Change in appetite 0 0 0 0 -  Feeling bad or failure about yourself  0 0 0 0 -  Trouble concentrating 0 0 0 0 -  Moving slowly or fidgety/restless 0 0 0 0 -  Suicidal thoughts 0 0 0 0 -  PHQ-9 Score 0 0 0 0 -  Difficult doing work/chores Not difficult at all Not difficult at all Not difficult at all Not difficult at all -   PHQ-2/9 Result is negative.    Fall Risk: Fall Risk  05/19/2019 04/15/2019 04/11/2019 11/12/2018 05/11/2018  Falls in the past year? 1 0 0 0 No  Number falls in past yr: 1 0 0 - -  Injury with Fall? 0 0 0 - -  Follow up Falls evaluation completed Falls evaluation completed Falls evaluation completed - -   Assessment & Plan  1. New daily persistent headache - CT HEAD W & WO CONTRAST; Future -  Ambulatory referral to Neurology - Dr. Clayborn Bigness has performed a thorough evaluation from a cardiac standpoint surrounding patient's history of irregular heartbeat, lightheadedness, and headaches.  We will proceed with neurology referral at this point. - Red flags for ER care discussed in detail and patient verbalizes understanding.  2. DM (diabetes mellitus), type 2 with peripheral vascular complications (HCC) - Insulin Detemir (LEVEMIR FLEXTOUCH) 100 UNIT/ML Pen; Inject 22 Units into the skin daily.  Dispense: 15 mL - POCT glucose (manual  entry)  3. Essential hypertension - Continue lisinopril; will allow goa BP <150/90 as he is becoming lightheaded frequently.  4. Obesity (BMI 30.0-34.9) - Discussed importance of 150 minutes of physical activity weekly, eat two servings of fish weekly, eat one serving of tree nuts ( cashews, pistachios, pecans, almonds.Marland Kitchen) every other day, eat 6 servings of fruit/vegetables daily and drink plenty of water and avoid sweet beverages.   5. Pure hypercholesterolemia - Fill crestor, continue vascepa  6. Stomach irritation - Continue medications as prescribed - bentyl and omeprazole.

## 2019-05-20 NOTE — Telephone Encounter (Signed)
Dani,  I called [employer] and was told No PA REQ for CPT 70470.   Confirmation # [id number]

## 2019-05-23 ENCOUNTER — Ambulatory Visit
Admission: RE | Admit: 2019-05-23 | Discharge: 2019-05-23 | Disposition: A | Discharge status: Home / Self Care | Payer: BC Managed Care – PPO | Source: Ambulatory Visit | Attending: Family Medicine | Admitting: Family Medicine

## 2019-05-23 ENCOUNTER — Other Ambulatory Visit: Payer: Self-pay

## 2019-05-23 DIAGNOSIS — G4452 New daily persistent headache (NDPH): Secondary | ICD-10-CM | POA: Diagnosis not present

## 2019-05-23 DIAGNOSIS — R51 Headache: Secondary | ICD-10-CM | POA: Diagnosis not present

## 2019-05-23 MED ORDER — IOHEXOL 300 MG/ML  SOLN
75.0000 mL | Freq: Once | INTRAMUSCULAR | Status: AC | PRN
  Administered 2019-05-23: 75 mL via INTRAVENOUS

## 2019-05-23 NOTE — Telephone Encounter (Signed)
Copied from [place] [id number]. Topic: General - Other >> May 20, 2019  2:04 PM Rainey Pines A wrote: Marcia Brash from [facility] preservice center called and stated that patient needs PA for insurance on  the CT scan that is scheduled on Monday 05/23/2019 and would like a callback at [phone] ext [place] for CPT 70470. Verified from [facility]

## 2019-05-27 DIAGNOSIS — G4452 New daily persistent headache (NDPH): Secondary | ICD-10-CM | POA: Diagnosis not present

## 2019-05-27 DIAGNOSIS — R42 Dizziness and giddiness: Secondary | ICD-10-CM | POA: Diagnosis not present

## 2019-05-31 ENCOUNTER — Other Ambulatory Visit: Payer: Self-pay

## 2019-05-31 ENCOUNTER — Ambulatory Visit (INDEPENDENT_AMBULATORY_CARE_PROVIDER_SITE_OTHER): Payer: BC Managed Care – PPO | Admitting: Family Medicine

## 2019-05-31 ENCOUNTER — Encounter: Payer: Self-pay | Admitting: Family Medicine

## 2019-05-31 VITALS — BP 124/72 | HR 97 | Temp 97.6°F | Resp 16 | Ht 72.0 in | Wt 226.3 lb

## 2019-05-31 DIAGNOSIS — E7800 Pure hypercholesterolemia, unspecified: Secondary | ICD-10-CM | POA: Diagnosis not present

## 2019-05-31 DIAGNOSIS — G4452 New daily persistent headache (NDPH): Secondary | ICD-10-CM | POA: Diagnosis not present

## 2019-05-31 DIAGNOSIS — I10 Essential (primary) hypertension: Secondary | ICD-10-CM

## 2019-05-31 DIAGNOSIS — E1151 Type 2 diabetes mellitus with diabetic peripheral angiopathy without gangrene: Secondary | ICD-10-CM

## 2019-05-31 MED ORDER — LEVEMIR FLEXTOUCH 100 UNIT/ML ~~LOC~~ SOPN: 26.0000 [IU] | PEN_INJECTOR | Freq: Every day | SUBCUTANEOUS | Status: DC

## 2019-05-31 MED ORDER — ROSUVASTATIN CALCIUM 20 MG PO TABS: 20.0000 mg | ORAL_TABLET | Freq: Every day | ORAL | 0 refills | Status: DC

## 2019-05-31 MED ORDER — LISINOPRIL 10 MG PO TABS: 5.0000 mg | ORAL_TABLET | Freq: Every day | ORAL | 3 refills | Status: DC

## 2019-05-31 NOTE — Progress Notes (Signed)
Name: Donald [name]   MRN: [id number]    DOB: Dec 22, 1965   Date:05/31/2019       Progress Note  Subjective  Chief Complaint  Chief Complaint  Patient presents with  . Follow-up    headache    HPI  Headaches and Lightheadedness: He has been experiencing this for about 2 months now.  He also noted history of irregular heart rhythm which prompted a referral to cardiology. Since last visit 1 week ago he has seen Dr. Manuella Ghazi who prescribed fioricet PRN and recommended magnesium supplement which I reminded him of today - he has not received RX for fioricet - advised to call their office.  Dr. Manuella Ghazi also recommended he see [facility] and placed referral.  Also checking some labs - B12, Vitamin D and ESR.  CT head ordered by myself was non-contributory, and Dr. Manuella Ghazi recommends an MRI as a next step for imaging.  History from prior visits: - He is being evaluated by Dr. Clayborn Bigness.  Had NM stress test done 05/05/2019 - during which time he did develop excessive diaphoresis and dizziness; Left ventricular cavity was normal, normal myocardial thickening and wall motion is noted.  Had echo 05/05/2019 which showed normal ventricular systolic function bilaterally, mild valvular regurgitation, no valvular stenosisi, and EF of 50%.  - Headaches - he is getting his headaches are now daily.  He is waking up with headache in the morning, does not wake him in the middle of the night.  Upon waking and sitting up, the headache causes some blurred vision, the pain worsens, then after sitting for a few minutes, he is able to get up and out of bed as the pain and blurred vision improve. The pain and blurred vision and lightheadedness will return throughout the day randomly - will last 2-3 minutes at a time. He is pain free between episodes, he is now having 3-4 episodes a day.   - He has been working on pushing fluids and this has not helped his symptoms.  - I did provide FMLA for the patient until he has neurology  appointment as I did not think that he was capable of performing his job duties at that time. 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   DM: BG's have been back up around 190-200's at 22units.  He was historically well controlled, but did have a spike in December 2019 and remained high at 9.7% on 04/15/2019.  We will increase his levemir to 22 units.  Also on Metformin max dose.  His diet has been healthy for the most part - not eating a lot of carbs or sweets.  Taking lisinopril, aspirin, vascepa and crestor.  HTN: Taking 49m Lisinopril and BP is well below goal - he would like to go down to 525magain, and I think this is reasonable as he has been experiencing lightheadedness.   Patient Active Problem List   Diagnosis Date Noted  . Diabetes [place] 2 in obese ([place]/13/[place]  . 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 Obesity (BMI 30.0-34.9) 05/27/2018  . Edema of esophagus   . Angiodysplasia of stomach and duodenum   . Stomach irritation   . Heartburn   . Smoker 05/11/2018  . Medication monitoring encounter 02/26/2018  . Hypertension 09/15/2017  . Ingrown toenail 07/06/2017  . Peripheral vascular insufficiency ([place]/05/2017  . Need for hepatitis C screening test 03/27/2017  .  Hyperlipidemia 10/31/2016    Past Surgical History:  Procedure Laterality Date  . ESOPHAGOGASTRODUODENOSCOPY (EGD) WITH PROPOFOL N/A 05/26/2018   Procedure: ESOPHAGOGASTRODUODENOSCOPY (EGD) WITH PROPOFOL;  Surgeon: Virgel Manifold, MD;  Location: [facility] ENDOSCOPY;  Service: Endoscopy;  Laterality: N/A;  . KNEE SURGERY Right     Family History  Problem Relation Age of Onset  . Hypertension Father   . Diabetes Father   . Liver disease Father   . Diverticulitis Mother   . Migraines Mother   . Cancer Sister        tumors in the breast   . Diabetes Maternal Grandfather   . Cancer Sister        tumors in the  breast   . Cancer Sister        tumor in the breast     Social History   Socioeconomic History  . Marital status: Married    Spouse name: Not on file  . Number of children: Not on file  . Years of education: Not on file  . Highest education level: Not on file  Occupational History  . Not on file  Social Needs  . Financial resource strain: Not on file  . Food insecurity    Worry: Not on file    Inability: Not on file  . Transportation needs    Medical: Not on file    Non-medical: Not on file  Tobacco Use  . Smoking status: Current Every Day Smoker    Packs/day: 1.00    Years: 30.00    Pack years: 30.00    Types: Cigarettes  . Smokeless tobacco: Never Used  Substance and Sexual Activity  . Alcohol use: Yes    Alcohol/week: 0.0 standard drinks    Comment: none in the last 3 weeks  . Drug use: No  . Sexual activity: Yes    Partners: Female  Lifestyle  . Physical activity    Days per week: Not on file    Minutes per session: Not on file  . Stress: Not on file  Relationships  . Social Herbalist on phone: Not on file    Gets together: Not on file    Attends religious service: Not on file    Active member of club or organization: Not on file    Attends meetings of clubs or organizations: Not on file    Relationship status: Not on file  . Intimate partner violence    Fear of current or ex partner: Not on file    Emotionally abused: Not on file    Physically abused: Not on file    Forced sexual activity: Not on file  Other Topics Concern  . Not on file  Social History Narrative  . Not on file     Current Outpatient Medications:  .  aspirin EC 81 MG EC tablet, Take 1 tablet (81 mg total) by mouth daily., Disp: , Rfl:  .  BD PEN NEEDLE MICRO U/F 32G X 6 MM MISC, USE FOR LEVEMIR INJECTION DAILY, Disp: 100 each, Rfl: 0 .  Blood Glucose Monitoring Suppl (D-CARE GLUCOMETER) w/Device KIT, 1 Units by Does not apply route 4 (four) times daily -  before meals  and at bedtime., Disp: 1 kit, Rfl: 0 .  dicyclomine (BENTYL) 20 MG tablet, Take 20 mg by mouth daily as needed. , Disp: , Rfl:  .  Icosapent Ethyl 1 g CAPS, Take 2 capsules (2 g total) by mouth 2 (two) times a  day., Disp: 360 capsule, Rfl: 1 .  Insulin Detemir (LEVEMIR FLEXTOUCH) 100 UNIT/ML Pen, Inject 22 Units into the skin daily., Disp: 15 mL, Rfl:  .  lisinopril (PRINIVIL,ZESTRIL) 10 MG tablet, Take 1 tablet (10 mg total) by mouth daily., Disp: 90 tablet, Rfl: 3 .  meloxicam (MOBIC) 7.5 MG tablet, Take 1 tablet (7.5 mg total) by mouth daily as needed for pain., Disp: 30 tablet, Rfl: 0 .  metFORMIN (GLUCOPHAGE) 1000 MG tablet, Take 1 tablet (1,000 mg total) by mouth 2 (two) times daily with a meal., Disp: 180 tablet, Rfl: 0 .  omeprazole (PRILOSEC) 20 MG capsule, Take 20 mg by mouth daily. , Disp: , Rfl:  .  rosuvastatin (CRESTOR) 20 MG tablet, Take 1 tablet (20 mg total) by mouth at bedtime. For cholesterol; this replaces simvastatin, Disp: 90 tablet, Rfl: 0  Allergies  Allergen Reactions  . No Known Allergies     I personally reviewed active problem list, medication list, allergies, lab results with the patient/caregiver today.   ROS Constitutional: Negative for fever or weight change.  Respiratory: Negative for cough and shortness of breath.   Cardiovascular: Negative for chest pain or palpitations.  Gastrointestinal: Negative for abdominal pain, no bowel changes.  Musculoskeletal: Negative for gait problem or joint swelling.  Skin: Negative for rash.  Neurological: Positive for dizziness or headache.  No other specific complaints in a complete review of systems (except as listed in HPI above).  Objective  Vitals:   05/31/19 0723  BP: 124/72  Pulse: 97  Resp: 16  Temp: 97.6 F (36.4 C)  TempSrc: Oral  SpO2: 95%  Weight: 226 lb 4.8 oz (102.6 kg)  Height: 6' (1.829 m)   Body mass index is 30.69 kg/m.  Physical Exam  Constitutional: Patient appears well-developed and  well-nourished. No distress.  HENT: Head: Normocephalic and atraumatic. Eyes: Conjunctivae and EOM are normal. No scleral icterus. Neck: Normal range of motion. Neck supple. No JVD present. Cardiovascular: Normal rate, regular rhythm and normal heart sounds.  No murmur heard. No BLE edema. Pulmonary/Chest: Effort normal and breath sounds normal. No respiratory distress. Musculoskeletal: Normal range of motion, no joint effusions. No gross deformities Neurological: Pt is alert and oriented to person, place, and time. No cranial nerve deficit. Coordination, balance, strength, speech and gait are normal.  Skin: Skin is warm and dry. No rash noted. No erythema.  Psychiatric: Patient has a normal mood and affect. behavior is normal. Judgment and thought content normal.  No results found for this or any previous visit (from the past 72 hour(s)).   PHQ2/9: Depression screen [facility] 2/9 05/31/2019 05/19/2019 04/15/2019 04/11/2019 11/12/2018  Decreased Interest 0 0 0 0 0  Down, Depressed, Hopeless 0 0 0 0 0  PHQ - 2 Score 0 0 0 0 0  Altered sleeping 0 0 0 0 0  Tired, decreased energy 0 0 0 0 0  Change in appetite 0 0 0 0 0  Feeling bad or failure about yourself  0 0 0 0 0  Trouble concentrating 0 0 0 0 0  Moving slowly or fidgety/restless 0 0 0 0 0  Suicidal thoughts 0 0 0 0 0  PHQ-9 Score 0 0 0 0 0  Difficult doing work/chores Not difficult at all Not difficult at all Not difficult at all Not difficult at all Not difficult at all  Some recent data might be hidden   PHQ-2/9 Result is negative.    Fall Risk: Fall Risk  05/31/2019 05/19/2019  04/15/2019 04/11/2019 11/12/2018  Falls in the past year? 0 1 0 0 0  Number falls in past yr: 0 1 0 0 -  Injury with Fall? 0 0 0 0 -  Follow up Falls evaluation completed Falls evaluation completed Falls evaluation completed Falls evaluation completed -   Assessment & Plan  1. New daily persistent headache - Follow up with Dr. Manuella Ghazi and ENT.  Still having  symptoms that are not improving at this time. - Will defer to Dr. Manuella Ghazi to clear patient to return to work based on his evaluation.  2. DM (diabetes mellitus), type 2 with peripheral vascular complications (HCC) - lisinopril (ZESTRIL) 10 MG tablet; Take 0.5 tablets (5 mg total) by mouth daily.  Dispense: 90 tablet; Refill: 3 - Insulin Detemir (LEVEMIR FLEXTOUCH) 100 UNIT/ML Pen; Inject 26 Units into the skin daily.  Dispense: 15 mL  3. Essential hypertension - lisinopril (ZESTRIL) 10 MG tablet; Take 0.5 tablets (5 mg total) by mouth daily.  Dispense: 90 tablet; Refill: 3  4. Pure hypercholesterolemia - rosuvastatin (CRESTOR) 20 MG tablet; Take 1 tablet (20 mg total) by mouth at bedtime. For cholesterol; this replaces simvastatin  Dispense: 90 tablet; Refill: 0

## 2019-05-31 NOTE — Patient Instructions (Addendum)
Call Dr. Trena Platt office and request the prescription for Fioricet that they stated you may take. Please call Dr. Trena Platt office to ask about ENT referral if you do not hear anything from ENT in 1-2 weeks. Ask Dr. Manuella Ghazi about your FMLA - when can you return to work?  These were the instructions from Dr. Lannie Fields Office (Neurologist) We reviewed CT - head with patient done on 05/23/2019, which was unremarkable.  - We will order MRI brain with and without contrast   - Start Magnesium Oxide 400 - 600 mg daily  Magnesium can be used as preventive treatment for migraine and other types of headaches. It has many benefits of relatively side-effect free, safe in pregnancy, relatively inexpensive, highly effective in some patients. Magnesium oxide is used typically 400-600 mg/day. It can cause diarrhea in some pts. Magnesium glycinate is better tolerated by GI system but it is hard to find (may have to look up online or health food store). We gave printed material if needed.  - Start Fioricet (acetaminophen 362m, butalbital 567m) by mouth two times a day as needed Fioricet is a combination of acetaminophen 32564mbutalbital 66m34mon narcotic, schedule III medication), and caffeine 40mg29md to treat headaches. This medication can cause medication rebound headache if used frequently. Possible side effects include sleepiness, drowsiness, lightheadedness, dizziness, shortness of breath, nausea, or vomiting.  - If work up come back negative we discussed ordering Lumbar Puncture.   - We will order Vit B12, Vit D, ESR. (These labs are done to rule out systemic causes of neurological symptoms.)  2. Dizziness - need to rule out peripheral vestibular lesion. - [facility]

## 2019-06-13 DIAGNOSIS — H903 Sensorineural hearing loss, bilateral: Secondary | ICD-10-CM | POA: Diagnosis not present

## 2019-06-13 DIAGNOSIS — R42 Dizziness and giddiness: Secondary | ICD-10-CM | POA: Diagnosis not present

## 2019-06-17 DIAGNOSIS — I499 Cardiac arrhythmia, unspecified: Secondary | ICD-10-CM | POA: Diagnosis not present

## 2019-06-20 ENCOUNTER — Other Ambulatory Visit: Payer: Self-pay | Admitting: Neurology

## 2019-06-20 DIAGNOSIS — G4452 New daily persistent headache (NDPH): Secondary | ICD-10-CM

## 2019-06-27 ENCOUNTER — Other Ambulatory Visit: Payer: Self-pay

## 2019-06-27 ENCOUNTER — Ambulatory Visit
Admission: RE | Admit: 2019-06-27 | Discharge: 2019-06-27 | Disposition: A | Discharge status: Home / Self Care | Payer: BC Managed Care – PPO | Source: Ambulatory Visit | Attending: Neurology | Admitting: Neurology

## 2019-06-27 DIAGNOSIS — R51 Headache: Secondary | ICD-10-CM | POA: Diagnosis not present

## 2019-06-27 DIAGNOSIS — G4452 New daily persistent headache (NDPH): Secondary | ICD-10-CM | POA: Diagnosis not present

## 2019-06-27 LAB — POCT I-STAT CREATININE: Creatinine, Ser: 1.3 mg/dL — ABNORMAL HIGH (ref 0.61–1.24)

## 2019-06-27 MED ORDER — GADOBUTROL 1 MMOL/ML IV SOLN
10.0000 mL | Freq: Once | INTRAVENOUS | Status: AC | PRN
  Administered 2019-06-27: 10 mL via INTRAVENOUS

## 2019-06-28 ENCOUNTER — Encounter: Payer: Self-pay | Admitting: Family Medicine

## 2019-07-08 NOTE — Telephone Encounter (Signed)
Copied from [place] [id number]. Topic: General - Inquiry >> Jul 08, 2019  2:27 PM Scherrie Gerlach wrote: Reason for CRM: pt states he spoke to helen regarding his FMLA paperwork.  Pt states he needs the paperwork faxed to [place] [facility].  Pt wants to know if he is released to return to work. Pt states he is going to loose his job if this paperwork is not sent. Pt states he needs results of mri sent to them as well. Please call pt as soon as you can.

## 2019-07-12 ENCOUNTER — Encounter: Payer: Self-pay | Admitting: Family Medicine

## 2019-07-22 ENCOUNTER — Other Ambulatory Visit: Payer: Self-pay

## 2019-07-22 ENCOUNTER — Ambulatory Visit: Payer: BC Managed Care – PPO | Admitting: Family Medicine

## 2019-07-22 ENCOUNTER — Encounter: Payer: Self-pay | Admitting: Family Medicine

## 2019-07-22 VITALS — BP 136/88 | HR 96 | Temp 97.9°F | Resp 18 | Ht 70.0 in | Wt 223.2 lb

## 2019-07-22 DIAGNOSIS — G4452 New daily persistent headache (NDPH): Secondary | ICD-10-CM

## 2019-07-22 DIAGNOSIS — I10 Essential (primary) hypertension: Secondary | ICD-10-CM

## 2019-07-22 DIAGNOSIS — E669 Obesity, unspecified: Secondary | ICD-10-CM

## 2019-07-22 DIAGNOSIS — E7800 Pure hypercholesterolemia, unspecified: Secondary | ICD-10-CM

## 2019-07-22 DIAGNOSIS — Z8679 Personal history of other diseases of the circulatory system: Secondary | ICD-10-CM

## 2019-07-22 DIAGNOSIS — E1169 Type 2 diabetes mellitus with other specified complication: Secondary | ICD-10-CM | POA: Diagnosis not present

## 2019-07-22 DIAGNOSIS — R42 Dizziness and giddiness: Secondary | ICD-10-CM | POA: Diagnosis not present

## 2019-07-22 NOTE — Patient Instructions (Signed)
STOP your Crestor (Rosuvastatin) for 7 days, see if this improves your headaches.

## 2019-07-22 NOTE — Progress Notes (Signed)
Name: Donald [name]   MRN: [id number]    DOB: June 09, 1966   Date:07/22/2019       Progress Note  Subjective  Chief Complaint  Chief Complaint  Patient presents with  . Migraine    follow up  . Nasal Congestion    HPI  Headaches and Lightheadedness:  He has been experiencing this for about 4 months now. He also noted history of irregular heart rhythm which prompted a referral to cardiology. He saw Dr. Clayborn Bigness and was determined the symptoms did not seem to be related to cardiac etiology.  He was then referred to Dr. Manuella Ghazi who prescribed fioricet PRN and recommended magnesium supplement.  Dr. Manuella Ghazi also recommended he see [facility] and placed referral - pt reports that he did attend an ENT appt - we will request records. MRI and CT scan with Dr. Manuella Ghazi were WNL, has follow up with Dr. Manuella Ghazi next Tuesday (in about 4 days).    - He does note an episode last night of possible transient loss of consciousness.  He was sitting up watching TV, and felt his eyes get heavy, then he states he does not remember what happened for a few minutes, then woke up and thinks he had syncopal episode.  This episode was unwitnessed, but his son did find him in his chair and shook him which did wake him/help him regain consciousness.  Pt notes he did not slump to the side, no incontinence, no shaking or twitching that he knows of, no slurred speech, confusion, facial droop.  He also denies any hearing changes, rhinorrhea.   Advised not to drive until he talks with Dr. Manuella Ghazi at his upcoming appointment.   DM:BG's have been back up around 190-200's at 22units - advised to increase to 24 units.He was historically well controlled, but did have a spike in December 2019 and remained high at 9.7% on 04/15/2019 - due for recheck today. Also on Metformin max dose. His diet has been healthy for the most part - not eating a lot of carbs or sweets. Taking lisinopril, aspirin, vascepa and crestor.    HTN: Taking 60m  Lisinopril and BP is well below goal - he would like to go down to 529magain, and I think this is reasonable as he has been experiencing lightheadedness.   History from prior visits: - He is being evaluated by Dr. CaClayborn BignessHad NM stress test done 05/05/2019 - during which time he did develop excessive diaphoresis and dizziness; Left ventricular cavity was normal, normal myocardial thickening and wall motion is noted. Had echo 05/05/2019 which showed normal ventricular systolic function bilaterally, mild valvular regurgitation, no valvular stenosisi, and EF of 50%.  - Headaches - he is getting his headaches are now daily. He is waking up with headache in the morning, does not wake him in the middle of the night. Upon waking and sitting up, the headache causes some blurred vision, the pain worsens, then after sitting for a few minutes, he is able to get up and out of bed as the pain and blurred vision improve. The pain and blurred vision and lightheadedness will return throughout the day randomly - will last 2-3 minutes at a time. He is pain free between episodes, he is now having 3-4 episodes a day.  - He has been working on pushing fluids and this has not helped his symptoms.    - I did provide FMLA for the patient until he has neurology appointment as I did  not think that he was capable of performing his job duties at that time. 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 With his ongoing daily symptoms I did extend this until today's visit.  At this time, he is describing a syncopal episode last night, and I am not comfortable clearing him for work at this time.   Patient Active Problem List   Diagnosis Date Noted  . Diabetes mellitus type 2 in obese ([place]) 11/12/2018  . 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 Obesity (BMI 30.0-34.9) 05/27/2018  . Edema of esophagus   . Angiodysplasia of stomach and duodenum   .  Stomach irritation   . Heartburn   . Smoker 05/11/2018  . Medication monitoring encounter 02/26/2018  . Hypertension 09/15/2017  . Ingrown toenail 07/06/2017  . Peripheral vascular insufficiency ([place]) 07/06/2017  . Need for hepatitis C screening test 03/27/2017  . Hyperlipidemia 10/31/2016    Past Surgical History:  Procedure Laterality Date  . ESOPHAGOGASTRODUODENOSCOPY (EGD) WITH PROPOFOL N/A 05/26/2018   Procedure: ESOPHAGOGASTRODUODENOSCOPY (EGD) WITH PROPOFOL;  Surgeon: Virgel Manifold, MD;  Location: [facility] ENDOSCOPY;  Service: Endoscopy;  Laterality: N/A;  . KNEE SURGERY Right     Family History  Problem Relation Age of Onset  . Hypertension Father   . Diabetes Father   . Liver disease Father   . Diverticulitis Mother   . Migraines Mother   . Cancer Sister        tumors in the breast   . Diabetes Maternal Grandfather   . Cancer Sister        tumors in the breast   . Cancer Sister        tumor in the breast     Social History   Socioeconomic History  . Marital status: Married    Spouse name: Not on file  . Number of children: Not on file  . Years of education: Not on file  . Highest education level: Not on file  Occupational History  . Not on file  Social Needs  . Financial resource strain: Not on file  . Food insecurity    Worry: Not on file    Inability: Not on file  . Transportation needs    Medical: Not on file    Non-medical: Not on file  Tobacco Use  . Smoking status: Current Every Day Smoker    Packs/day: 1.00    Years: 30.00    Pack years: 30.00    Types: Cigarettes  . Smokeless tobacco: Never Used  Substance and Sexual Activity  . Alcohol use: Yes    Alcohol/week: 0.0 standard drinks    Comment: none in the last 3 weeks  . Drug use: No  . Sexual activity: Yes    Partners: Female  Lifestyle  . Physical activity    Days per week: Not on file    Minutes per session: Not on file  . Stress: Not on file  Relationships  . Social  Herbalist on phone: Not on file    Gets together: Not on file    Attends religious service: Not on file    Active member of club or organization: Not on file    Attends meetings of clubs or organizations: Not on file    Relationship status: Not on file  . Intimate partner violence    Fear of current or ex partner: Not on file  Emotionally abused: Not on file    Physically abused: Not on file    Forced sexual activity: Not on file  Other Topics Concern  . Not on file  Social History Narrative  . Not on file     Current Outpatient Medications:  .  aspirin EC 81 MG EC tablet, Take 1 tablet (81 mg total) by mouth daily., Disp: , Rfl:  .  BD PEN NEEDLE MICRO U/F 32G X 6 MM MISC, USE FOR LEVEMIR INJECTION DAILY, Disp: 100 each, Rfl: 0 .  Blood Glucose Monitoring Suppl (D-CARE GLUCOMETER) w/Device KIT, 1 Units by Does not apply route 4 (four) times daily -  before meals and at bedtime., Disp: 1 kit, Rfl: 0 .  dicyclomine (BENTYL) 20 MG tablet, Take 20 mg by mouth daily as needed. , Disp: , Rfl:  .  Icosapent Ethyl 1 g CAPS, Take 2 capsules (2 g total) by mouth 2 (two) times a day., Disp: 360 capsule, Rfl: 1 .  Insulin Detemir (LEVEMIR FLEXTOUCH) 100 UNIT/ML Pen, Inject 26 Units into the skin daily., Disp: 15 mL, Rfl:  .  lisinopril (ZESTRIL) 10 MG tablet, Take 0.5 tablets (5 mg total) by mouth daily., Disp: 90 tablet, Rfl: 3 .  meloxicam (MOBIC) 7.5 MG tablet, Take 1 tablet (7.5 mg total) by mouth daily as needed for pain., Disp: 30 tablet, Rfl: 0 .  omeprazole (PRILOSEC) 20 MG capsule, Take 20 mg by mouth daily. , Disp: , Rfl:  .  rosuvastatin (CRESTOR) 20 MG tablet, Take 1 tablet (20 mg total) by mouth at bedtime. For cholesterol; this replaces simvastatin, Disp: 90 tablet, Rfl: 0 .  metFORMIN (GLUCOPHAGE) 1000 MG tablet, Take 1 tablet (1,000 mg total) by mouth 2 (two) times daily with a meal., Disp: 180 tablet, Rfl: 0  Allergies  Allergen Reactions  . No Known Allergies      I personally reviewed active problem list, medication list, allergies, health maintenance, notes from last encounter, lab results with the patient/caregiver today.   ROS  Ten systems reviewed and is negative except as mentioned in HPI  Objective  Vitals:   07/22/19 0851  BP: 136/88  Pulse: 96  Resp: 18  Temp: 97.9 F (36.6 C)  TempSrc: Oral  SpO2: 96%  Weight: 223 lb 3.2 oz (101.2 kg)  Height: _0  (1.778 m)    Body mass index is 32.03 kg/m.  Physical Exam  Constitutional: Patient appears well-developed and well-nourished. No distress.  HENT: Head: Normocephalic and atraumatic. Ears: bilateral TMs with no erythema or effusion; Nose: Nose normal. Mouth/Throat: Oropharynx is clear and moist. No oropharyngeal exudate or tonsillar swelling.  Eyes: Conjunctivae and EOM are normal. No scleral icterus.  Pupils are equal, round, and reactive to light.  Neck: Normal range of motion. Neck supple. No JVD present. No thyromegaly present.  Cardiovascular: Normal rate, regular rhythm and normal heart sounds.  No murmur heard. No BLE edema. No Carotid Bruit Pulmonary/Chest: Effort normal and breath sounds normal. No respiratory distress. Musculoskeletal: Normal range of motion, no joint effusions. No gross deformities Neurological: Pt is alert and oriented to person, place, and time. No cranial nerve deficit. Coordination, balance, strength, speech and gait are normal.  Skin: Skin is warm and dry. No rash noted. No erythema.  Psychiatric: Patient has a normal mood and affect. behavior is normal. Judgment and thought content normal.  No results found for this or any previous visit (from the past 72 hour(s)).  PHQ2/9: Depression screen [facility] 2/9 07/22/2019  05/31/2019 05/19/2019 04/15/2019 04/11/2019  Decreased Interest 0 0 0 0 0  Down, Depressed, Hopeless 0 0 0 0 0  PHQ - 2 Score 0 0 0 0 0  Altered sleeping 0 0 0 0 0  Tired, decreased energy 0 0 0 0 0  Change in appetite 0 0 0 0 0   Feeling bad or failure about yourself  0 0 0 0 0  Trouble concentrating 0 0 0 0 0  Moving slowly or fidgety/restless 0 0 0 0 0  Suicidal thoughts 0 0 0 0 0  PHQ-9 Score 0 0 0 0 0  Difficult doing work/chores Not difficult at all Not difficult at all Not difficult at all Not difficult at all Not difficult at all  Some recent data might be hidden   PHQ-2/9 Result is negative.    Fall Risk: Fall Risk  07/22/2019 05/31/2019 05/19/2019 04/15/2019 04/11/2019  Falls in the past year? 0 0 1 0 0  Number falls in past yr: 0 0 1 0 0  Injury with Fall? 0 0 0 0 0  Follow up _0     Assessment & Plan  1. New daily persistent headache - Follow up with Dr. Manuella Ghazi next week  2. Episodic lightheadedness - Follow up with Dr. Manuella Ghazi next week.  His episode of "loss of consiousness" from last night is unclear - possibly fell asleep vs syncopal episode while laying down.  Will check some labs today to ensure no acute abnormality, advised no driving until he speaks with Dr. Manuella Ghazi next week.  Advised ER if another episode occurs.  - COMPLETE METABOLIC PANEL WITH GFR - CBC with Differential/Platelet  3. History of irregular heartbeat - Cleared from a cardiology standpoint regarding his current symptoms  4. Essential hypertension - Stable today - COMPLETE METABOLIC PANEL WITH GFR  5. Pure hypercholesterolemia - We will pause statin therapy for 7 days to see if this helps headaches.   6. Diabetes mellitus type 2 in obese ([facility]) - Increase Levemir to 24 units; will likely go to 30 units after A1C and consider adding SGLT2 or GLP1 therapy. - Hemoglobin A1c

## 2019-07-23 LAB — CBC WITH DIFFERENTIAL/PLATELET
Absolute Monocytes: 676 cells/uL (ref 200–950)
Basophils Absolute: 76 cells/uL (ref 0–200)
Basophils Relative: 0.7 %
Eosinophils Absolute: 131 cells/uL (ref 15–500)
Eosinophils Relative: 1.2 %
HCT: 38.9 % (ref 38.5–50.0)
Hemoglobin: 13 g/dL — ABNORMAL LOW (ref 13.2–17.1)
Lymphs Abs: 2758 cells/uL (ref 850–3900)
MCH: 30.7 pg (ref 27.0–33.0)
MCHC: 33.4 g/dL (ref 32.0–36.0)
MCV: 91.7 fL (ref 80.0–100.0)
MPV: 11.6 fL (ref 7.5–12.5)
Monocytes Relative: 6.2 %
Neutro Abs: 7259 cells/uL (ref 1500–7800)
Neutrophils Relative %: 66.6 %
Platelets: 325 10*3/uL (ref 140–400)
RBC: 4.24 10*6/uL (ref 4.20–5.80)
RDW: 12 % (ref 11.0–15.0)
Total Lymphocyte: 25.3 %
WBC: 10.9 10*3/uL — ABNORMAL HIGH (ref 3.8–10.8)

## 2019-07-23 LAB — HEMOGLOBIN A1C
Hgb A1c MFr Bld: 10.5 % of total Hgb — ABNORMAL HIGH (ref <5.7)
Mean Plasma Glucose: 255 (calc)
eAG (mmol/L): 14.1 (calc)

## 2019-07-23 LAB — COMPLETE METABOLIC PANEL WITH GFR
AG Ratio: 1.3 (calc) (ref 1.0–2.5)
ALT: 14 U/L (ref 9–46)
AST: 11 U/L (ref 10–35)
Albumin: 4.1 g/dL (ref 3.6–5.1)
Alkaline phosphatase (APISO): 68 U/L (ref 35–144)
BUN: 12 mg/dL (ref 7–25)
CO2: 27 mmol/L (ref 20–32)
Calcium: 9.4 mg/dL (ref 8.6–10.3)
Chloride: 104 mmol/L (ref 98–110)
Creat: 0.88 mg/dL (ref 0.70–1.33)
GFR, Est African American: 112 mL/min/{1.73_m2} (ref >=60)
GFR, Est Non African American: 97 mL/min/{1.73_m2} (ref >=60)
Globulin: 3.2 g/dL (calc) (ref 1.9–3.7)
Glucose, Bld: 187 mg/dL — ABNORMAL HIGH (ref 65–99)
Potassium: 4.3 mmol/L (ref 3.5–5.3)
Sodium: 140 mmol/L (ref 135–146)
Total Bilirubin: 0.3 mg/dL (ref 0.2–1.2)
Total Protein: 7.3 g/dL (ref 6.1–8.1)

## 2019-07-26 ENCOUNTER — Other Ambulatory Visit: Payer: Self-pay | Admitting: Family Medicine

## 2019-07-26 DIAGNOSIS — E669 Obesity, unspecified: Secondary | ICD-10-CM | POA: Diagnosis not present

## 2019-07-26 DIAGNOSIS — E7800 Pure hypercholesterolemia, unspecified: Secondary | ICD-10-CM

## 2019-07-26 DIAGNOSIS — E1151 Type 2 diabetes mellitus with diabetic peripheral angiopathy without gangrene: Secondary | ICD-10-CM

## 2019-07-26 DIAGNOSIS — R404 Transient alteration of awareness: Secondary | ICD-10-CM | POA: Diagnosis not present

## 2019-07-26 DIAGNOSIS — E538 Deficiency of other specified B group vitamins: Secondary | ICD-10-CM | POA: Diagnosis not present

## 2019-07-26 DIAGNOSIS — E559 Vitamin D deficiency, unspecified: Secondary | ICD-10-CM | POA: Diagnosis not present

## 2019-07-26 DIAGNOSIS — E1169 Type 2 diabetes mellitus with other specified complication: Secondary | ICD-10-CM | POA: Diagnosis not present

## 2019-07-26 DIAGNOSIS — G4452 New daily persistent headache (NDPH): Secondary | ICD-10-CM | POA: Diagnosis not present

## 2019-07-26 MED ORDER — JARDIANCE 10 MG PO TABS: 10.0000 mg | ORAL_TABLET | Freq: Every day | ORAL | 1 refills | Status: DC

## 2019-08-01 ENCOUNTER — Ambulatory Visit: Payer: Self-pay | Admitting: *Deleted

## 2019-08-01 DIAGNOSIS — E1151 Type 2 diabetes mellitus with diabetic peripheral angiopathy without gangrene: Secondary | ICD-10-CM

## 2019-08-01 NOTE — Chronic Care Management (AMB) (Addendum)
  Care Management   Referral Note  08/01/2019 Name: Donald [name] MRN: [id number] DOB: Jul 21, 1966  Referred by: PCP, Raelyn Ensign, FNP on 07/26/19 .  Referral Priority Indicated: Urgent (anticipate clinician appointment scheduling with 5 business days; please call clinician to expedite as needed)   Mr. Skibicki was given information about Care Management services today including:  1. Care Management services includes personalized support from designated clinical staff supervised by his physician, including individualized plan of care and coordination with other care providers 2. 24/7 contact phone numbers for assistance for urgent and routine care needs. 3. The patient may stop case management services at any time by phone call to the office staff.  Patient agreed to services and verbal consent obtained.    Follow up plan: An initial telephone outreach has been scheduled with the pharamcist for: 08/02/19 @ 3:15pm.  [facility] Care [facility] [phone]

## 2019-08-01 NOTE — Patient Instructions (Addendum)
Visit Information  The patient verbalized understanding of instructions provided today and declined a print copy of patient instruction materials.   Telephone follow up appointment with care management team member scheduled for: 08/02/19 @ 3:15pm  [facility] Care Management  [phone]

## 2019-08-02 ENCOUNTER — Ambulatory Visit: Payer: Self-pay | Admitting: Pharmacist

## 2019-08-02 DIAGNOSIS — E1151 Type 2 diabetes mellitus with diabetic peripheral angiopathy without gangrene: Secondary | ICD-10-CM

## 2019-08-02 NOTE — Chronic Care Management (AMB) (Signed)
  Care Management   Initial Pharmacy Note   08/02/2019 Name: JACQUE GARRELS MRN: [id number] DOB: May 08, 1966  Subjective Conny D Widener is a 53 y.o. year old male who is a primary care patient of Hubbard Hartshorn, FNP. The care management pharmacist was consulted for assistance with care management and care coordination needs.    Assessment Review of patient status, including review of consultants reports, relevant laboratory and other test results, and collaboration with appropriate care team members and the patient's provider was performed as part of comprehensive patient evaluation and provision of chronic care management services.    Lab Results  Component Value Date   POCGLU 205 (A) 05/19/2019   POCGLU 125 (A) 11/27/2017   POCGLU 114 (A) 02/20/2017    Lab Results  Component Value Date   HGBA1C 10.5 (H) 07/22/2019   HGBA1C 9.7 (H) 04/15/2019   HGBA1C 9.4 (H) 11/15/2018   Lab Results  Component Value Date   MICROALBUR 38.9 11/15/2018   LDLCALC 128 (H) 04/15/2019   CREATININE 0.88 07/22/2019     SDOH (Social Determinants of Health) screening performed today: None. See Care Plan for related entries.   Goals Addressed            This Visit's Progress   . Diabetes management (pt-stated)       Current Barriers:  Marland Kitchen Knowledge Deficits related to basic Diabetes pathophysiology and self care/management . Knowledge Deficits related to medications used for management of diabetes  Case Manager Clinical Goal(s):  Over the next 90 days, patient will demonstrate improved adherence to prescribed treatment plan for diabetes self care/management as evidenced by:  . daily monitoring and recording of CBG  . adherence to prescribed medication regimen . Lowered A1c  Interventions:  . Increase Levemir to 30 units every evening  . Check BG before every meal. Write down in a chart.  . Counseling about ADA diet, exercise . Patient educated on purpose, proper use and potential adverse  effects of Jardiance    Patient Self Care Activities:  . Self administers oral medications as prescribed . Self administers insulin as prescribed . Checks blood sugars as prescribed and utilize hyper and hypoglycemia protocol as needed  Initial goal documentation         Telephone follow up appointment with care management team member scheduled for: Friday for BG readings and possible Levemir adjustment  Ruben Reason, PharmD Clinical Pharmacist [facility] [phone]

## 2019-08-02 NOTE — Patient Instructions (Signed)
  Thank you allowing the Chronic Care Management Team to be a part of your care!    Goals Addressed            This Visit's Progress   . Diabetes management (pt-stated)       Current Barriers:  Marland Kitchen Knowledge Deficits related to basic Diabetes pathophysiology and self care/management . Knowledge Deficits related to medications used for management of diabetes  Case Manager Clinical Goal(s):  Over the next 90 days, patient will demonstrate improved adherence to prescribed treatment plan for diabetes self care/management as evidenced by:  . daily monitoring and recording of CBG  . adherence to prescribed medication regimen . Lowered A1c  Interventions:  . Increase Levemir to 30 units every evening  . Check BG before every meal. Write down in a chart.  . Counseling about ADA diet, exercise . Patient educated on purpose, proper use and potential adverse effects of Jardiance    Patient Self Care Activities:  . Self administers oral medications as prescribed . Self administers insulin as prescribed . Checks blood sugars as prescribed and utilize hyper and hypoglycemia protocol as needed  Initial goal documentation

## 2019-08-03 ENCOUNTER — Encounter: Payer: Self-pay | Admitting: Family Medicine

## 2019-08-03 ENCOUNTER — Telehealth: Payer: Self-pay | Admitting: Family Medicine

## 2019-08-03 NOTE — Telephone Encounter (Signed)
Patient came in 08/02/2019 to discuss paperwork briefly.  I did review entire case with Attending physician Dr. Ancil Boozer.  I advised the patient that I will provide completion of paperwork through our last visit 07/22/2019, however going forward, he will need to work with neurology regarding his ongoing disability claim as they are evaluating him for unexplained altered consciousness episode.  I did advise he needs to work on getting his DM under better control - had appt with Almyra Free PharmD to discuss insulin titration yesterday afternoon.  Paperwork for [name] is completed for Mr. Donald [name], and we will attempt to reach out to Gayland Curry PA-C with neurology to clarify.

## 2019-08-04 IMAGING — CT CT HEAD WITHOUT AND WITH CONTRAST
1 of 2 series · 13 of 30 positions shown, 17 images · IV contrast (omnipaque)
Comparison: CT head 07/22/2016

CLINICAL DATA: Headache. New onset chronic daily headache. Blurred
vision

EXAM:
CT HEAD WITHOUT AND WITH CONTRAST
TECHNIQUE: Contiguous axial images were obtained from the base of the skull
through the vertex without and with intravenous contrast
CONTRAST:  75mL OMNIPAQUE IOHEXOL 300 MG/ML  SOLN

[Series 4: head w · axial · 0.41mm/px · z∈[+110,+230]mm · 13 of 29 slices shown, 17 images]
[im 3/29  brain]
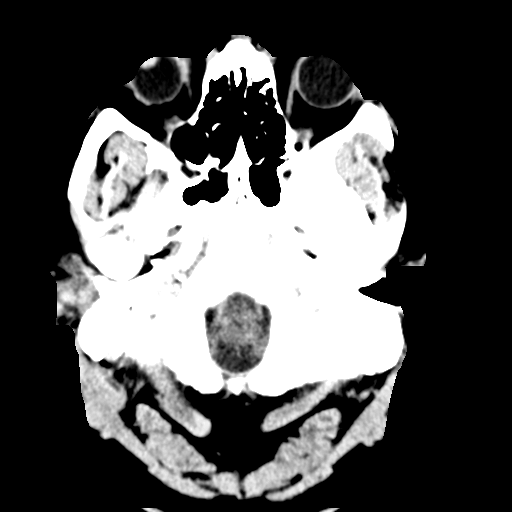
[im 3/29  bone]
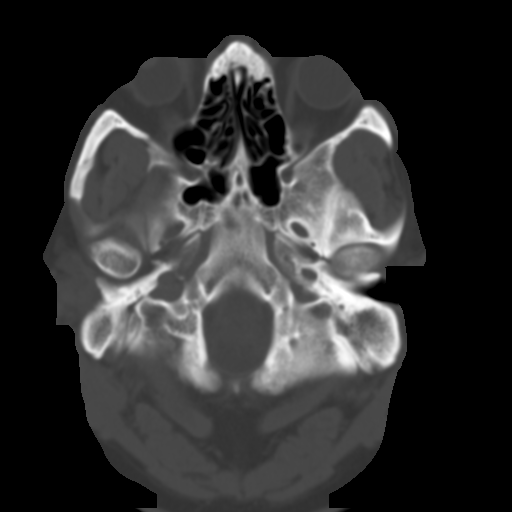
[im 5/29  brain]
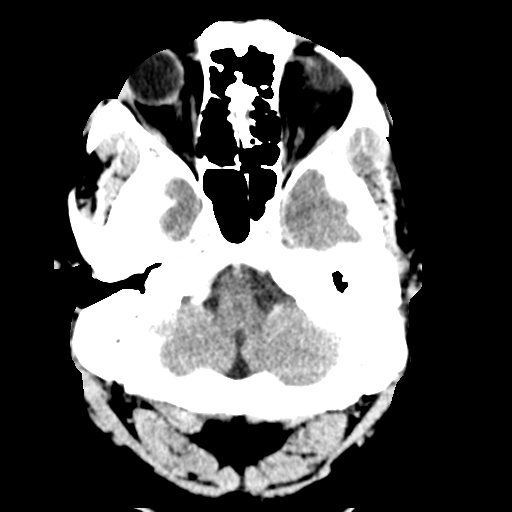
[im 7/29  brain]
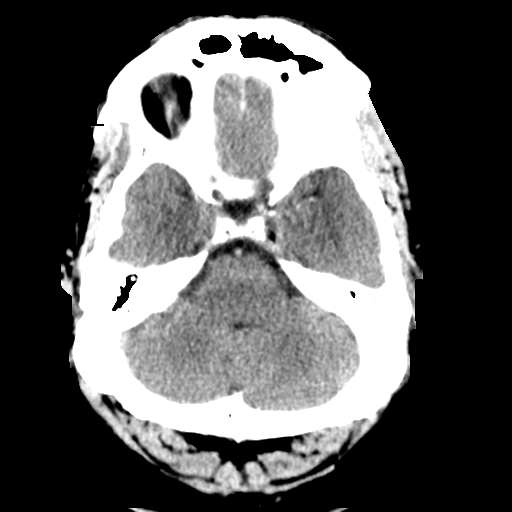
[im 9/29  brain]
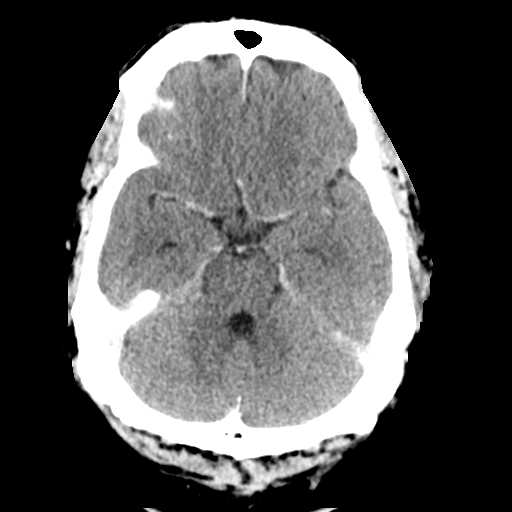
[im 11/29  brain]
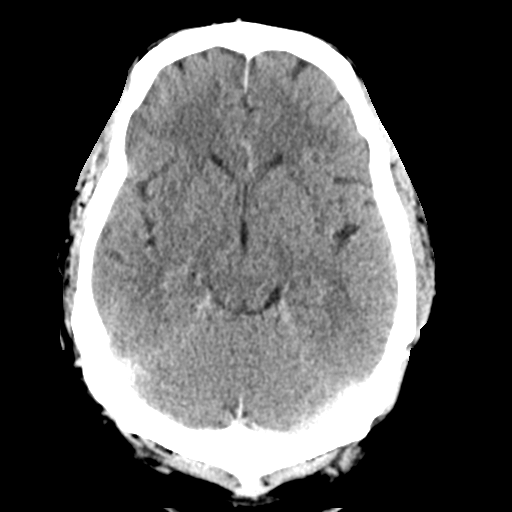
[im 11/29  bone]
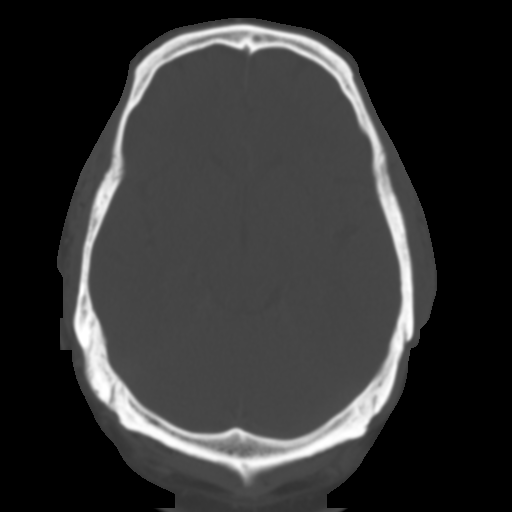
[im 13/29  brain]
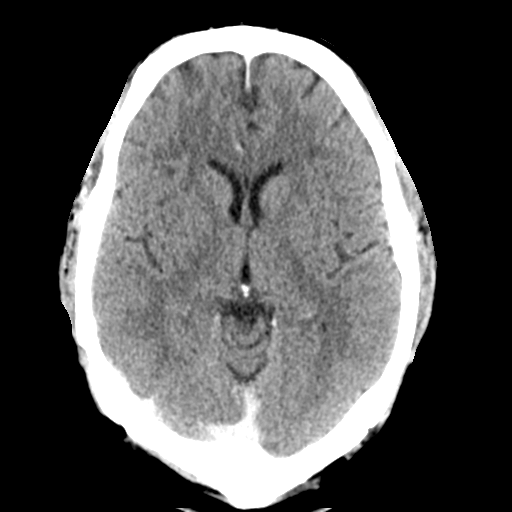
[im 15/29  brain]
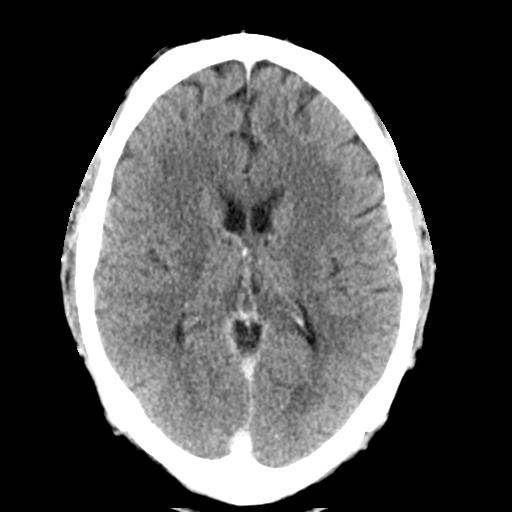
[im 17/29  brain]
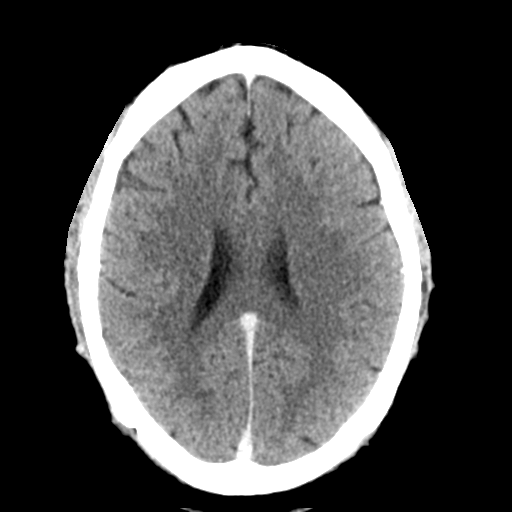
[im 19/29  brain]
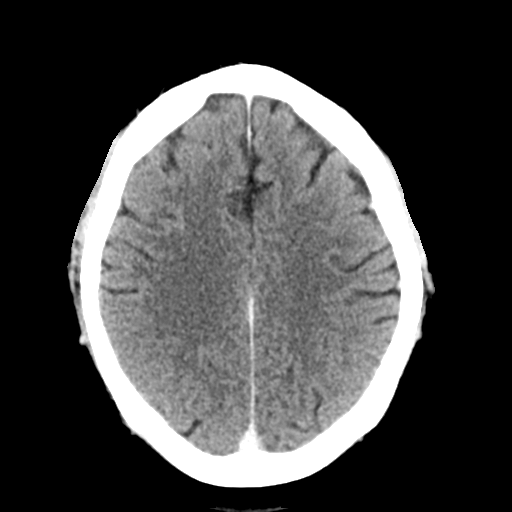
[im 19/29  bone]
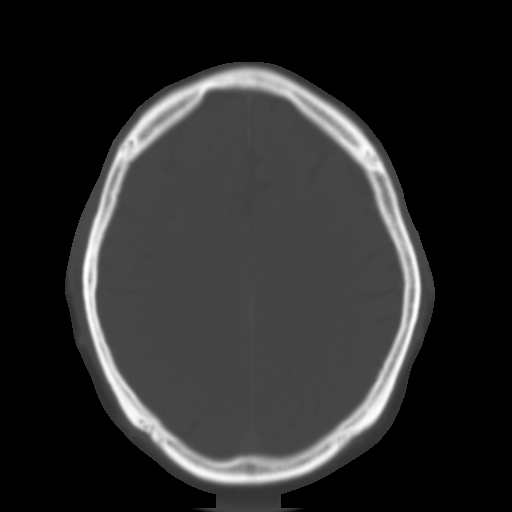
[im 21/29  brain]
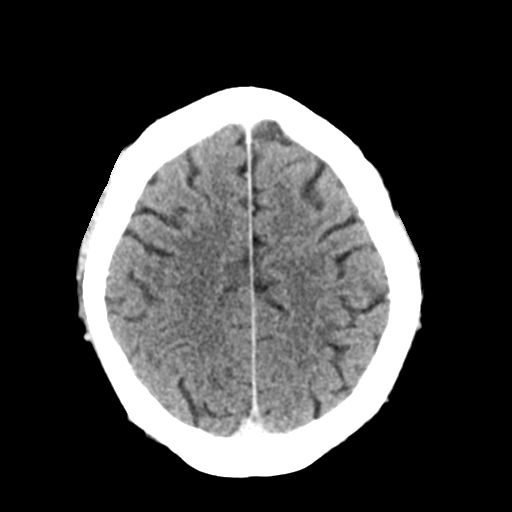
[im 23/29  brain]
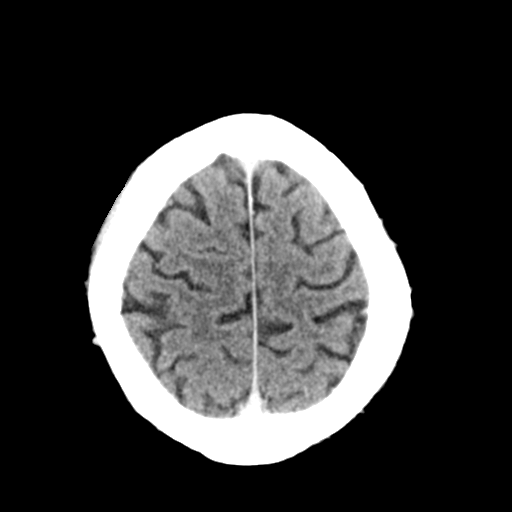
[im 25/29  brain]
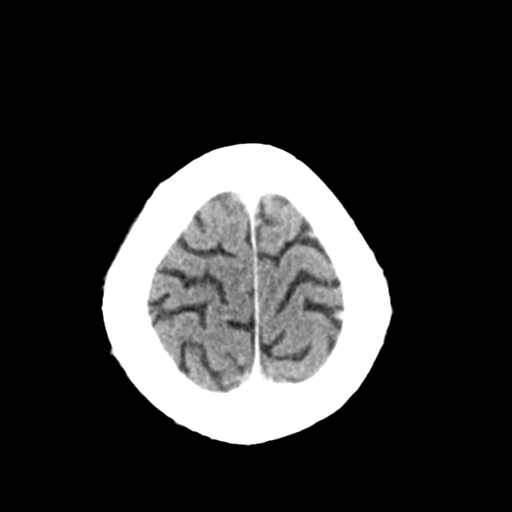
[im 27/29  brain]
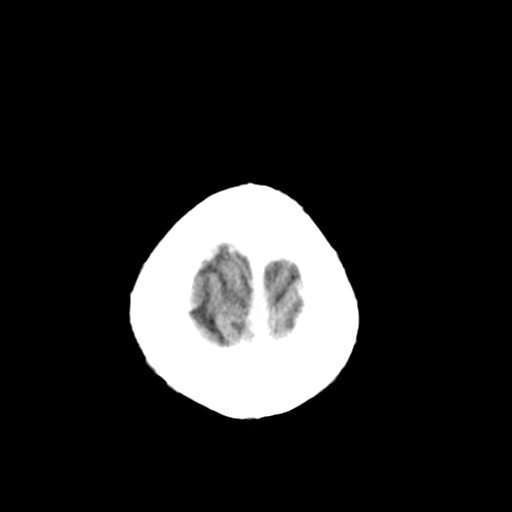
[im 27/29  bone]
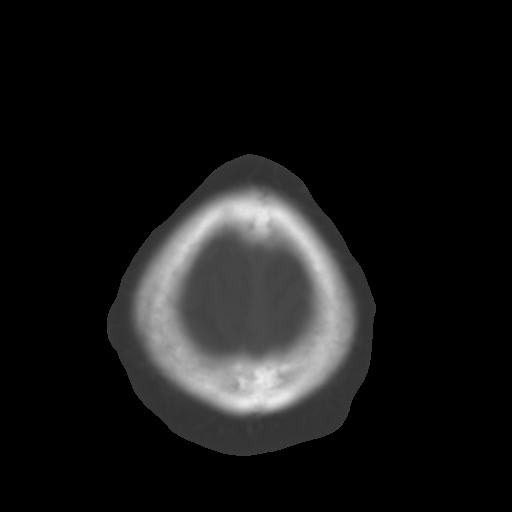

[13 of 30 positions shown; findings below may reference images not displayed]

FINDINGS: Brain: No evidence of acute infarction, hemorrhage, hydrocephalus,
extra-axial collection or mass lesion/mass effect.

Normal enhancement postcontrast infusion.

Vascular: Negative for hyperdense vessel. Normal venous enhancement.

Skull: Negative

Sinuses/Orbits: Negative

Other: None
IMPRESSION: Normal CT head with contrast.

## 2019-08-05 ENCOUNTER — Telehealth: Payer: Self-pay

## 2019-08-05 ENCOUNTER — Ambulatory Visit: Payer: Self-pay | Admitting: Pharmacist

## 2019-08-05 NOTE — Chronic Care Management (AMB) (Signed)
  Care Management   Note  08/05/2019 Name: Donald [name] MRN: [id number] DOB: 05-30-1966  53 y.o. year old male referred to Chronic Care Management by Raelyn Ensign for insulin titration  Was unable to reach patient via telephone today and have left HIPAA compliant voicemail asking patient to return my call. (unsuccessful outreach #1).  Donald [name], PharmD Clinical Pharmacist [facility] [phone]

## 2019-08-09 ENCOUNTER — Telehealth: Payer: Self-pay

## 2019-08-09 ENCOUNTER — Ambulatory Visit: Payer: Self-pay | Admitting: Pharmacist

## 2019-08-09 NOTE — Chronic Care Management (AMB) (Signed)
  Care Management   Note  08/09/2019 Name: Donald [name] MRN: [id number] DOB: 12-20-1965  53 y.o. year old male referred to Chronic Care Management by Raelyn Ensign for insulin titration  Was unable to reach patient via telephone today and have left HIPAA compliant voicemail asking patient to return my call. (unsuccessful outreach #2).   Follow up: Outreach by CCM pharmacist within the next 5-7 days  Ruben Reason, PharmD Clinical Pharmacist [facility] [phone]

## 2019-08-10 ENCOUNTER — Ambulatory Visit: Payer: Self-pay | Admitting: Pharmacist

## 2019-08-10 DIAGNOSIS — E1151 Type 2 diabetes mellitus with diabetic peripheral angiopathy without gangrene: Secondary | ICD-10-CM

## 2019-08-10 DIAGNOSIS — G4452 New daily persistent headache (NDPH): Secondary | ICD-10-CM

## 2019-08-10 NOTE — Progress Notes (Signed)
Documentation reviewed, help from PharmD is greatly appreciated.

## 2019-08-10 NOTE — Chronic Care Management (AMB) (Signed)
  Care Management   Initial Pharmacy Note   08/10/2019 Name: Donald [name] MRN: [id number] DOB: [birth year]  Subjective Donald [name] is a 53 y.o. year old male who is a primary care patient of Hubbard Hartshorn, FNP. The care management pharmacist was consulted for assistance with care management and care coordination needs.    Assessment Review of patient status, including review of consultants reports, relevant laboratory and other test results, and collaboration with appropriate care team members and the patient's provider was performed as part of comprehensive patient evaluation and provision of chronic care management services.    Lab Results  Component Value Date   POCGLU 205 (A) 05/19/2019   POCGLU 125 (A) 11/27/2017   POCGLU 114 (A) 02/20/2017    Lab Results  Component Value Date   HGBA1C 10.5 (H) 07/22/2019   HGBA1C 9.7 (H) 04/15/2019   HGBA1C 9.4 (H) 11/15/2018   Lab Results  Component Value Date   MICROALBUR 38.9 11/15/2018   LDLCALC 128 (H) 04/15/2019   CREATININE 0.88 07/22/2019     SDOH (Social Determinants of Health) screening performed today: None. See Care Plan for related entries.   Goals Addressed            This Visit's Progress   . Diabetes management (pt-stated)       Current Barriers:  Marland Kitchen Knowledge Deficits related to basic Diabetes pathophysiology and self care/management . Knowledge Deficits related to medications used for management of diabetes  Case Manager Clinical Goal(s):  Over the next 90 days, patient will demonstrate improved adherence to prescribed treatment plan for diabetes self care/management as evidenced by:  . daily monitoring and recording of CBG  . adherence to prescribed medication regimen . Lowered A1c  Interventions:  . Updated 9/9: Increase Levemir to 32 units every evening  . Check BG before every meal. Write down in a chart.  . Counseling about ADA diet, exercise . Patient educated on purpose, proper use and potential  adverse effects of Jardiance    Patient Self Care Activities:  . Self administers oral medications as prescribed . Self administers insulin as prescribed . Checks blood sugars as prescribed and utilize hyper and hypoglycemia protocol as needed  Please see past updates related to this goal by clicking on the "Past Updates" button in the selected goal      . Severe Headache interfering with Sleep (pt-stated)       Current Barriers:  . Intractable headache (followed by [facility])  Pharmacist Clinical Goal(s):  Marland Kitchen Over the next 30 days, patient will work with pharmacist and Dr. Manuella Ghazi as well as practice lifestyle modifications to address needs related to chronic intractable migraines and the effect on sleeping  Interventions: . Discussed plans with patient for ongoing care management follow up and provided patient with direct contact information for care management team . Patient titrating topamax:  o Topamax 25mg  BID x 7 days o Topamax 50mg  BID starts 08/12/19  Patient Self Care Activities:  . Self administers medications as prescribed . Calls provider office for new concerns or questions . Stay well hydrated . Follow DM care plan to get diabetes under control, which could help ease headache  Initial goal documentation         Telephone follow up appointment with care management team member scheduled for: Friday for BG readings and possible Levemir adjustment  Ruben Reason, PharmD Clinical Pharmacist [facility] [phone]

## 2019-08-12 ENCOUNTER — Telehealth: Payer: Self-pay | Admitting: Family Medicine

## 2019-08-12 ENCOUNTER — Ambulatory Visit: Payer: Self-pay | Admitting: Pharmacist

## 2019-08-12 DIAGNOSIS — G4452 New daily persistent headache (NDPH): Secondary | ICD-10-CM

## 2019-08-12 DIAGNOSIS — H4710 Unspecified papilledema: Secondary | ICD-10-CM | POA: Diagnosis not present

## 2019-08-12 DIAGNOSIS — R42 Dizziness and giddiness: Secondary | ICD-10-CM

## 2019-08-12 DIAGNOSIS — E1151 Type 2 diabetes mellitus with diabetic peripheral angiopathy without gangrene: Secondary | ICD-10-CM

## 2019-08-12 NOTE — Telephone Encounter (Signed)
Patient is calling to requesting  that his insurance card , to be faxed to [facility] Fax [phone]  [facility]- [phone]  Attention Amy.  Thank you

## 2019-08-12 NOTE — Chronic Care Management (AMB) (Signed)
Care Management   Initial Pharmacy Note   08/12/2019 Name: Donald [name] MRN: [id number] DOB: Oct 02, 1966  Subjective Brayn D Zabriskie is a 53 y.o. year old male who is a primary care patient of Hubbard Hartshorn, FNP. The care management pharmacist was consulted for assistance with care management and care coordination needs.    Assessment Review of patient status, including review of consultants reports, relevant laboratory and other test results, and collaboration with appropriate care team members and the patient's provider was performed as part of comprehensive patient evaluation and provision of chronic care management services.    Lab Results  Component Value Date   POCGLU 205 (A) 05/19/2019   POCGLU 125 (A) 11/27/2017   POCGLU 114 (A) 02/20/2017    Lab Results  Component Value Date   HGBA1C 10.5 (H) 07/22/2019   HGBA1C 9.7 (H) 04/15/2019   HGBA1C 9.4 (H) 11/15/2018   Lab Results  Component Value Date   MICROALBUR 38.9 11/15/2018   LDLCALC 128 (H) 04/15/2019   CREATININE 0.88 07/22/2019     SDOH (Social Determinants of Health) screening performed today: None. See Care Plan for related entries.   Goals Addressed            This Visit's Progress   . Diabetes management (pt-stated)       Current Barriers:  Marland Kitchen Knowledge Deficits related to basic Diabetes pathophysiology and self care/management . Knowledge Deficits related to medications used for management of diabetes  Case Manager Clinical Goal(s):  Over the next 90 days, patient will demonstrate improved adherence to prescribed treatment plan for diabetes self care/management as evidenced by:  . daily monitoring and recording of CBG  . adherence to prescribed medication regimen . Lowered A1c  Interventions:  . Updated 9/11: Increase Levemir to 34 units every evening  . Check BG before every meal. Write down in a chart.  . Counseling about ADA diet, exercise . Patient educated on purpose, proper use and  potential adverse effects of Jardiance    Patient Self Care Activities:  . Self administers oral medications as prescribed . Self administers insulin as prescribed . Checks blood sugars as prescribed and utilize hyper and hypoglycemia protocol as needed  Please see past updates related to this goal by clicking on the "Past Updates" button in the selected goal      . Severe Headache interfering with Sleep (pt-stated)       Current Barriers:  . Intractable headache (followed by [facility])  Pharmacist Clinical Goal(s):  Marland Kitchen Over the next 30 days, patient will work with pharmacist and Dr. Manuella Ghazi as well as practice lifestyle modifications to address needs related to chronic intractable migraines and the effect on sleeping  Interventions: . Discussed plans with patient for ongoing care management follow up and provided patient with direct contact information for care management team . Patient titrating topamax:  o Topamax 25mg  BID x 7 days o Topamax 50mg  BID starts 08/12/19 . Updated 9/11: patient has received differential diagnosis including tumor today; very upset; follows up with Raquel Sarna on Monday, Sept 14  Patient Self Care Activities:  . Self administers medications as prescribed . Calls provider office for new concerns or questions . Stay well hydrated . Follow DM care plan to get diabetes under control, which could help ease headache  Please see past updates related to this goal by clicking on the "Past Updates" button in the selected goal          Telephone follow  up appointment with care management team member scheduled for: Tuesday 9/15 for BG readings and possible Levemir adjustment  Karalee HeightJulie Addi Pak, PharmD Clinical Pharmacist [facility] [phone]

## 2019-08-15 ENCOUNTER — Other Ambulatory Visit: Payer: Self-pay

## 2019-08-15 ENCOUNTER — Ambulatory Visit (INDEPENDENT_AMBULATORY_CARE_PROVIDER_SITE_OTHER): Payer: BC Managed Care – PPO | Admitting: Family Medicine

## 2019-08-15 ENCOUNTER — Encounter: Payer: Self-pay | Admitting: Family Medicine

## 2019-08-15 VITALS — BP 136/78 | HR 90 | Temp 97.9°F | Resp 16 | Ht 70.0 in | Wt 216.4 lb

## 2019-08-15 DIAGNOSIS — G4452 New daily persistent headache (NDPH): Secondary | ICD-10-CM

## 2019-08-15 DIAGNOSIS — E1151 Type 2 diabetes mellitus with diabetic peripheral angiopathy without gangrene: Secondary | ICD-10-CM | POA: Diagnosis not present

## 2019-08-15 DIAGNOSIS — R42 Dizziness and giddiness: Secondary | ICD-10-CM | POA: Diagnosis not present

## 2019-08-15 MED ORDER — LEVEMIR FLEXTOUCH 100 UNIT/ML ~~LOC~~ SOPN: 32.0000 [IU] | PEN_INJECTOR | Freq: Every day | SUBCUTANEOUS | Status: DC

## 2019-08-15 NOTE — Patient Instructions (Signed)
Diabetes Mellitus and Nutrition, Adult When you have diabetes (diabetes mellitus), it is very important to have healthy eating habits because your blood sugar (glucose) levels are greatly affected by what you eat and drink. Eating healthy foods in the appropriate amounts, at about the same times every day, can help you:  Control your blood glucose.  Lower your risk of heart disease.  Improve your blood pressure.  Reach or maintain a healthy weight. Every person with diabetes is different, and each person has different needs for a meal plan. Your health care provider may recommend that you work with a diet and nutrition specialist (dietitian) to make a meal plan that is best for you. Your meal plan may vary depending on factors such as:  The calories you need.  The medicines you take.  Your weight.  Your blood glucose, blood pressure, and cholesterol levels.  Your activity level.  Other health conditions you have, such as heart or kidney disease. How do carbohydrates affect me? Carbohydrates, also called carbs, affect your blood glucose level more than any other type of food. Eating carbs naturally raises the amount of glucose in your blood. Carb counting is a method for keeping track of how many carbs you eat. Counting carbs is important to keep your blood glucose at a healthy level, especially if you use insulin or take certain oral diabetes medicines. It is important to know how many carbs you can safely have in each meal. This is different for every person. Your dietitian can help you calculate how many carbs you should have at each meal and for each snack. Foods that contain carbs include:  Bread, cereal, rice, pasta, and crackers.  Potatoes and corn.  Peas, beans, and lentils.  Milk and yogurt.  Fruit and juice.  Desserts, such as cakes, cookies, ice cream, and candy. How does alcohol affect me? Alcohol can cause a sudden decrease in blood glucose (hypoglycemia),  especially if you use insulin or take certain oral diabetes medicines. Hypoglycemia can be a life-threatening condition. Symptoms of hypoglycemia (sleepiness, dizziness, and confusion) are similar to symptoms of having too much alcohol. If your health care provider says that alcohol is safe for you, follow these guidelines:  Limit alcohol intake to no more than 1 drink per day for nonpregnant women and 2 drinks per day for men. One drink equals 12 oz of beer, 5 oz of wine, or 1 oz of hard liquor.  Do not drink on an empty stomach.  Keep yourself hydrated with water, diet soda, or unsweetened iced tea.  Keep in mind that regular soda, juice, and other mixers may contain a lot of sugar and must be counted as carbs. What are tips for following this plan?  Reading food labels  Start by checking the serving size on the "Nutrition Facts" label of packaged foods and drinks. The amount of calories, carbs, fats, and other nutrients listed on the label is based on one serving of the item. Many items contain more than one serving per package.  Check the total grams (g) of carbs in one serving. You can calculate the number of servings of carbs in one serving by dividing the total carbs by 15. For example, if a food has 30 g of total carbs, it would be equal to 2 servings of carbs.  Check the number of grams (g) of saturated and trans fats in one serving. Choose foods that have low or no amount of these fats.  Check the number of  milligrams (mg) of salt (sodium) in one serving. Most people should limit total sodium intake to less than 2,300 mg per day.  Always check the nutrition information of foods labeled as "low-fat" or "nonfat". These foods may be higher in added sugar or refined carbs and should be avoided.  Talk to your dietitian to identify your daily goals for nutrients listed on the label. Shopping  Avoid buying canned, premade, or processed foods. These foods tend to be high in fat, sodium,  and added sugar.  Shop around the outside edge of the grocery store. This includes fresh fruits and vegetables, bulk grains, fresh meats, and fresh dairy. Cooking  Use low-heat cooking methods, such as baking, instead of high-heat cooking methods like deep frying.  Cook using healthy oils, such as olive, canola, or sunflower oil.  Avoid cooking with butter, cream, or high-fat meats. Meal planning  Eat meals and snacks regularly, preferably at the same times every day. Avoid going long periods of time without eating.  Eat foods high in fiber, such as fresh fruits, vegetables, beans, and whole grains. Talk to your dietitian about how many servings of carbs you can eat at each meal.  Eat 4-6 ounces (oz) of lean protein each day, such as lean meat, chicken, fish, eggs, or tofu. One oz of lean protein is equal to: ? 1 oz of meat, chicken, or fish. ? 1 egg. ?  cup of tofu.  Eat some foods each day that contain healthy fats, such as avocado, nuts, seeds, and fish. Lifestyle  Check your blood glucose regularly.  Exercise regularly as told by your health care provider. This may include: ? 150 minutes of moderate-intensity or vigorous-intensity exercise each week. This could be brisk walking, biking, or water aerobics. ? Stretching and doing strength exercises, such as yoga or weightlifting, at least 2 times a week.  Take medicines as told by your health care provider.  Do not use any products that contain nicotine or tobacco, such as cigarettes and e-cigarettes. If you need help quitting, ask your health care provider.  Work with a Social worker or diabetes educator to identify strategies to manage stress and any emotional and social challenges. Questions to ask a health care provider  Do I need to meet with a diabetes educator?  Do I need to meet with a dietitian?  What number can I call if I have questions?  When are the best times to check my blood glucose? Where to find more  information:  American Diabetes Association: diabetes.org  Academy of Nutrition and Dietetics: www.eatright.[employer] of Diabetes and Digestive and Kidney Diseases (NIH): [employer] Summary  A healthy meal plan will help you control your blood glucose and maintain a healthy lifestyle.  Working with a diet and nutrition specialist (dietitian) can help you make a meal plan that is best for you.  Keep in mind that carbohydrates (carbs) and alcohol have immediate effects on your blood glucose levels. It is important to count carbs and to use alcohol carefully. This information is not intended to replace advice given to you by your health care provider. Make sure you discuss any questions you have with your health care provider. Document Released: 08/14/2005 Document Revised: 10/30/2017 Document Reviewed: 12/22/2016 Elsevier Patient Education  2020 [employer].   Diabetes Mellitus and Exercise Exercising regularly is important for your overall health, especially when you have diabetes (diabetes mellitus). Exercising is not only about losing weight. It has many other health benefits, such as  increasing muscle strength and bone density and reducing body fat and stress. This leads to improved fitness, flexibility, and endurance, all of which result in better overall health. Exercise has additional benefits for people with diabetes, including:  Reducing appetite.  Helping to lower and control blood glucose.  Lowering blood pressure.  Helping to control amounts of fatty substances (lipids) in the blood, such as cholesterol and triglycerides.  Helping the body to respond better to insulin (improving insulin sensitivity).  Reducing how much insulin the body needs.  Decreasing the risk for heart disease by: ? Lowering cholesterol and triglyceride levels. ? Increasing the levels of good cholesterol. ? Lowering blood glucose levels. What is my activity plan? Your  health care provider or certified diabetes educator can help you make a plan for the type and frequency of exercise (activity plan) that works for you. Make sure that you:  Do at least 150 minutes of moderate-intensity or vigorous-intensity exercise each week. This could be brisk walking, biking, or water aerobics. ? Do stretching and strength exercises, such as yoga or weightlifting, at least 2 times a week. ? Spread out your activity over at least 3 days of the week.  Get some form of physical activity every day. ? Do not go more than 2 days in a row without some kind of physical activity. ? Avoid being inactive for more than 30 minutes at a time. Take frequent breaks to walk or stretch.  Choose a type of exercise or activity that you enjoy, and set realistic goals.  Start slowly, and gradually increase the intensity of your exercise over time. What do I need to know about managing my diabetes?   Check your blood glucose before and after exercising. ? If your blood glucose is 240 mg/dL (16.113.3 mmol/L) or higher before you exercise, check your urine for ketones. If you have ketones in your urine, do not exercise until your blood glucose returns to normal. ? If your blood glucose is 100 mg/dL (5.6 mmol/L) or lower, eat a snack containing 15-20 grams of carbohydrate. Check your blood glucose 15 minutes after the snack to make sure that your level is above 100 mg/dL (5.6 mmol/L) before you start your exercise.  Know the symptoms of low blood glucose (hypoglycemia) and how to treat it. Your risk for hypoglycemia increases during and after exercise. Common symptoms of hypoglycemia can include: ? Hunger. ? Anxiety. ? Sweating and feeling clammy. ? Confusion. ? Dizziness or feeling light-headed. ? Increased heart rate or palpitations. ? Blurry vision. ? Tingling or numbness around the mouth, lips, or tongue. ? Tremors or shakes. ? Irritability.  Keep a rapid-acting carbohydrate snack  available before, during, and after exercise to help prevent or treat hypoglycemia.  Avoid injecting insulin into areas of the body that are going to be exercised. For example, avoid injecting insulin into: ? The arms, when playing tennis. ? The legs, when jogging.  Keep records of your exercise habits. Doing this can help you and your health care provider adjust your diabetes management plan as needed. Write down: ? Food that you eat before and after you exercise. ? Blood glucose levels before and after you exercise. ? The type and amount of exercise you have done. ? When your insulin is expected to peak, if you use insulin. Avoid exercising at times when your insulin is peaking.  When you start a new exercise or activity, work with your health care provider to make sure the activity is safe for  you, and to adjust your insulin, medicines, or food intake as needed.  Drink plenty of water while you exercise to prevent dehydration or heat stroke. Drink enough fluid to keep your urine clear or pale yellow. Summary  Exercising regularly is important for your overall health, especially when you have diabetes (diabetes mellitus).  Exercising has many health benefits, such as increasing muscle strength and bone density and reducing body fat and stress.  Your health care provider or certified diabetes educator can help you make a plan for the type and frequency of exercise (activity plan) that works for you.  When you start a new exercise or activity, work with your health care provider to make sure the activity is safe for you, and to adjust your insulin, medicines, or food intake as needed. This information is not intended to replace advice given to you by your health care provider. Make sure you discuss any questions you have with your health care provider. Document Released: 02/07/2004 Document Revised: 06/11/2017 Document Reviewed: 04/28/2016 Elsevier Patient Education  2020 [employer].

## 2019-08-15 NOTE — Progress Notes (Signed)
Name: Donald [name]   MRN: [id number]    DOB: 1966/09/18   Date:08/15/2019       Progress Note  Subjective  Chief Complaint  Chief Complaint  Patient presents with  . Follow-up    1 month recheck    HPI  Pt presents for 1 month follow up with his wife today.  Headaches and Lightheadedness:  He has been experiencing this forabout 34monthnow. He also noted history of irregular heart rhythm which prompted a referral to cardiology.He saw Dr. CClayborn Bignessand was determined the symptoms did not seem to be related to cardiac etiology.  He was then referred to Dr. SManuella Ghaziwho prescribed fioricet PRN and recommended magnesium supplement. Dr. SManuella Ghazialso recommended he see  ENT and placed referral - he saw ENT who did not feel this was vestibular in nature and referred back to neurology/cardiology.  MRI and CT scan with Dr. SManuella Ghaziwere WNL.  He has been seeing AGayland CurryPA-C with Dr. STrena Plattoffice, last visit was 07/29/2019 and was started on topamax taper.    He saw Dr. KEdison Pacewith AGolden Shoreson 08/12/2019 and was told he had some abnormalities in his eyes indicative of pseudotumor cerebri.  We are awaiting these records to [place] [facility] [facility]  Will also try to contact Konz PA-C's office to let her know about this new development.  Advised he continue with his topamax for the time being.  He has been on short term then long term disability for this issue, and I did advise him on 08/03/2019, that from my perspective as PCP, I cannot provide additional supporting evidence for keeping him out of work as he has had a very thorough evaluation from several specialists.  At this point, any out of work paperwork will need to be completed by neurology as they are continuing to work on his headaches/lightheadedness.    DM:BG's had beenback up around 190-200's at 22units - has been working with JAlmyra FreePharmD with CCM to titrate up his basal insulin and is now up to 32 units levemir daily. He  was historically well controlled, but did have a spike in December 2019 and remained high at 9.7% on 04/15/2019, and 10.5% on 07/22/2019. Also on Metformin max dose; started Jardiance 155mat last visit.  His diet has been healthy for the most part - not eating a lot of carbs or sweets. Taking lisinopril, aspirin, vascepaand crestor.  Highest BG in the last week was 200; lowest BG in the last week was 136.  We will obtain BMP today to check on kidney function and discuss medication options with JuAlmyra Free may consider increase of Jardiance.   Patient Active Problem List   Diagnosis Date Noted  . Diabetes mellitus type 2 in obese ([place]/13[place]  . 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 Obesity (BMI 30.0-34.9) 05/27/2018  . Edema of esophagus   . Angiodysplasia of stomach and duodenum   . Stomach irritation   . Heartburn   . Smoker 05/11/2018  . Medication monitoring encounter 02/26/2018  . Hypertension 09/15/2017  . Ingrown toenail 07/06/2017  . Peripheral vascular insufficiency ([place]/05/2017  . Need for hepatitis C screening test 03/27/2017  . Hyperlipidemia 10/31/2016    Past Surgical History:  Procedure Laterality Date  . ESOPHAGOGASTRODUODENOSCOPY (EGD) WITH PROPOFOL N/A 05/26/2018   Procedure: ESOPHAGOGASTRODUODENOSCOPY (EGD) WITH PROPOFOL;  Surgeon: TaVirgel ManifoldMD;  Location: [facility] ENDOSCOPY;  Service: Endoscopy;  Laterality: N/A;  . KNEE SURGERY  Right     Family History  Problem Relation Age of Onset  . Hypertension Father   . Diabetes Father   . Liver disease Father   . Diverticulitis Mother   . Migraines Mother   . Cancer Sister        tumors in the breast   . Diabetes Maternal Grandfather   . Cancer Sister        tumors in the breast   . Cancer Sister        tumor in the breast     Social History   Socioeconomic History  . Marital status: Married    Spouse name: Not on file  . Number of children: Not on file  . Years of education: Not on file  .  Highest education level: Not on file  Occupational History  . Not on file  Social Needs  . Financial resource strain: Not on file  . Food insecurity    Worry: Not on file    Inability: Not on file  . Transportation needs    Medical: Not on file    Non-medical: Not on file  Tobacco Use  . Smoking status: Current Every Day Smoker    Packs/day: 1.00    Years: 30.00    Pack years: 30.00    Types: Cigarettes  . Smokeless tobacco: Never Used  Substance and Sexual Activity  . Alcohol use: Yes    Alcohol/week: 0.0 standard drinks    Comment: none in the last 3 weeks  . Drug use: No  . Sexual activity: Yes    Partners: Female  Lifestyle  . Physical activity    Days per week: Not on file    Minutes per session: Not on file  . Stress: Not on file  Relationships  . Social Herbalist on phone: Not on file    Gets together: Not on file    Attends religious service: Not on file    Active member of club or organization: Not on file    Attends meetings of clubs or organizations: Not on file    Relationship status: Not on file  . Intimate partner violence    Fear of current or ex partner: Not on file    Emotionally abused: Not on file    Physically abused: Not on file    Forced sexual activity: Not on file  Other Topics Concern  . Not on file  Social History Narrative  . Not on file     Current Outpatient Medications:  .  aspirin EC 81 MG EC tablet, Take 1 tablet (81 mg total) by mouth daily., Disp: , Rfl:  .  BD PEN NEEDLE MICRO U/F 32G X 6 MM MISC, USE FOR LEVEMIR INJECTION DAILY, Disp: 100 each, Rfl: 0 .  Blood Glucose Monitoring Suppl (D-CARE GLUCOMETER) w/Device KIT, 1 Units by Does not apply route 4 (four) times daily -  before meals and at bedtime., Disp: 1 kit, Rfl: 0 .  empagliflozin (JARDIANCE) 10 MG TABS tablet, Take 10 mg by mouth daily before breakfast., Disp: 90 tablet, Rfl: 1 .  Icosapent Ethyl 1 g CAPS, Take 2 capsules (2 g total) by mouth 2 (two) times  a day., Disp: 360 capsule, Rfl: 1 .  Insulin Detemir (LEVEMIR FLEXTOUCH) 100 UNIT/ML Pen, Inject 32-60 Units into the skin daily., Disp: 15 mL, Rfl:  .  lisinopril (ZESTRIL) 10 MG tablet, Take 0.5 tablets (5 mg total) by mouth daily., Disp: 90 tablet,  Rfl: 3 .  meloxicam (MOBIC) 7.5 MG tablet, Take 1 tablet (7.5 mg total) by mouth daily as needed for pain., Disp: 30 tablet, Rfl: 0 .  omeprazole (PRILOSEC) 20 MG capsule, Take 20 mg by mouth daily. , Disp: , Rfl:  .  rosuvastatin (CRESTOR) 20 MG tablet, TAKE 1 TABLET (20 MG TOTAL) BY MOUTH AT BEDTIME. FOR CHOLESTEROL THIS REPLACES SIMVASTATIN, Disp: 90 tablet, Rfl: 0 .  topiramate (TOPAMAX) 25 MG tablet, Take 25 mg nightly for one week then increase to 25 mg two times a day (every 12 hours) for one week then increase to 25 mg in the morning and 50 mg at night for one week then increase to 50 mg two times a day (every 12 hours), Disp: , Rfl:  .  Vitamin D, Ergocalciferol, (DRISDOL) 1.25 MG (50000 UT) CAPS capsule, Take 50,000 Units by mouth once a week., Disp: , Rfl:  .  metFORMIN (GLUCOPHAGE) 1000 MG tablet, Take 1 tablet (1,000 mg total) by mouth 2 (two) times daily with a meal., Disp: 180 tablet, Rfl: 0  Allergies  Allergen Reactions  . No Known Allergies     I personally reviewed active problem list, medication list, allergies, notes from last encounter, lab results with the patient/caregiver today.   ROS  Constitutional: Negative for fever or weight change.  Respiratory: Negative for cough and shortness of breath.   Cardiovascular: Negative for chest pain or palpitations.  Gastrointestinal: Negative for abdominal pain, no bowel changes.  Musculoskeletal: Negative for gait problem or joint swelling.  Skin: Negative for rash.  Neurological: Positive for dizziness or headache.  No other specific complaints in a complete review of systems (except as listed in HPI above).  Objective  Vitals:   08/15/19 0828  BP: 136/78  Pulse: 90   Resp: 16  Temp: 97.9 F (36.6 C)  TempSrc: Oral  SpO2: 95%  Weight: 216 lb 6.4 oz (98.2 kg)  Height: 5' 10"  (1.778 m)   Body mass index is 31.05 kg/m.  Physical Exam  Constitutional: Patient appears well-developed and well-nourished. No distress.  HENT: Head: Normocephalic and atraumatic.  Eyes: Conjunctivae and EOM are normal. No scleral icterus. Neck: Normal range of motion. Neck supple. No JVD present. No thyromegaly present.  Cardiovascular: Normal rate, regular rhythm and normal heart sounds.  No murmur heard. No BLE edema. Pulmonary/Chest: Effort normal and breath sounds normal. No respiratory distress. Musculoskeletal: Normal range of motion, no joint effusions. No gross deformities Neurological: Pt is alert and oriented to person, place, and time. No cranial nerve deficit. Coordination, speech within normal limits. Skin: Skin is warm and dry. No rash noted. No erythema. There is acanthosis nigricans to the neck - nearly circumferential. Psychiatric: Patient has a normal mood and affect. behavior is normal. Judgment and thought content normal.  No results found for this or any previous visit (from the past 72 hour(s)).   PHQ2/9: Depression screen [facility] 2/9 08/15/2019 07/22/2019 05/31/2019 05/19/2019 04/15/2019  Decreased Interest 0 0 0 0 0  Down, Depressed, Hopeless 0 0 0 0 0  PHQ - 2 Score 0 0 0 0 0  Altered sleeping 2 0 0 0 0  Tired, decreased energy 0 0 0 0 0  Change in appetite 0 0 0 0 0  Feeling bad or failure about yourself  0 0 0 0 0  Trouble concentrating 0 0 0 0 0  Moving slowly or fidgety/restless 0 0 0 0 0  Suicidal thoughts 0 0 0 0 0  PHQ-9 Score 2 0 0 0 0  Difficult doing work/chores Not difficult at all Not difficult at all Not difficult at all Not difficult at all Not difficult at all  Some recent data might be hidden   PHQ-2/9 Result is negative.    Fall Risk: Fall Risk  08/15/2019 07/22/2019 05/31/2019 05/19/2019 04/15/2019  Falls in the past year? 0 0 0 1  0  Number falls in past yr: 0 0 0 1 0  Injury with Fall? 0 0 0 0 0  Follow up Falls evaluation completed Falls evaluation completed Falls evaluation completed Falls evaluation completed Falls evaluation completed    Assessment & Plan  1. DM (diabetes mellitus), type 2 with peripheral vascular complications ([place]) - Labs today to check kidney function, then increase jardiance to 48m daily if kidney function is stable. - BASIC METABOLIC PANEL WITH GFR - Insulin Detemir (LEVEMIR FLEXTOUCH) 100 UNIT/ML Pen; Inject 32-60 Units into the skin daily.  Dispense: 15 mL  2. New daily persistent headache 3. Episodic lightheadedness - Possible Pseudotumor Cerebri - Spoke with Autumn Konz PA-C who kindly took my call this morning and advised patient needs to call to schedule a follow up appt in about a month and she will titrate up on his Topamax dosing.  We are awaiting Dr. KMelony Overlyrecords and will [facility]  He is advised that Topamax titration, weight loss, getting BG's under better control, routine careful exercise as tolerated (with wife's supervision/assistance) will help to regain control of his symptoms. Did discuss

## 2019-08-16 ENCOUNTER — Ambulatory Visit: Payer: Self-pay | Admitting: Pharmacist

## 2019-08-16 DIAGNOSIS — E1169 Type 2 diabetes mellitus with other specified complication: Secondary | ICD-10-CM

## 2019-08-16 DIAGNOSIS — E1151 Type 2 diabetes mellitus with diabetic peripheral angiopathy without gangrene: Secondary | ICD-10-CM

## 2019-08-16 LAB — BASIC METABOLIC PANEL WITH GFR
BUN: 20 mg/dL (ref 7–25)
CO2: 26 mmol/L (ref 20–32)
Calcium: 10 mg/dL (ref 8.6–10.3)
Chloride: 104 mmol/L (ref 98–110)
Creat: 1.12 mg/dL (ref 0.70–1.33)
GFR, Est African American: 85 mL/min/{1.73_m2} (ref >=60)
GFR, Est Non African American: 74 mL/min/{1.73_m2} (ref >=60)
Glucose, Bld: 138 mg/dL — ABNORMAL HIGH (ref 65–99)
Potassium: 4.5 mmol/L (ref 3.5–5.3)
Sodium: 138 mmol/L (ref 135–146)

## 2019-08-17 ENCOUNTER — Other Ambulatory Visit: Payer: Self-pay | Admitting: Family Medicine

## 2019-08-17 DIAGNOSIS — E1151 Type 2 diabetes mellitus with diabetic peripheral angiopathy without gangrene: Secondary | ICD-10-CM

## 2019-08-17 MED ORDER — EMPAGLIFLOZIN 25 MG PO TABS: 25.0000 mg | ORAL_TABLET | Freq: Every day | ORAL | 1 refills | Status: DC

## 2019-08-17 NOTE — Patient Instructions (Signed)
Goals Addressed            This Visit's Progress   . Diabetes management (pt-stated)       Current Barriers:  Marland Kitchen Knowledge Deficits related to basic Diabetes pathophysiology and self care/management . Knowledge Deficits related to medications used for management of diabetes  Case Manager Clinical Goal(s):  Over the next 90 days, patient will demonstrate improved adherence to prescribed treatment plan for diabetes self care/management as evidenced by:  . daily monitoring and recording of CBG  . adherence to prescribed medication regimen . Lowered A1c  Interventions:  . Updated 9/15: Continue Levemir at 32 units QHS; does Emily want to increase Jardiance dose to 25mg ? . Check BG before every meal. Write down in a chart.  . Counseling about ADA diet, exercise . Patient educated on purpose, proper use and potential adverse effects of Jardiance    Patient Self Care Activities:  . Self administers oral medications as prescribed . Self administers insulin as prescribed . Checks blood sugars as prescribed and utilize hyper and hypoglycemia protocol as needed  Please see past updates related to this goal by clicking on the "Past Updates" button in the selected goal

## 2019-08-17 NOTE — Chronic Care Management (AMB) (Signed)
  Care Management   Initial Pharmacy Note   08/17/2019 Name: YUAN GANN MRN: [id number] DOB: 23-Dec-1965  Subjective Floyed D Kurtzman is a 53 y.o. year old male who is a primary care patient of Hubbard Hartshorn, FNP. The care management pharmacist was consulted for assistance with care management and care coordination needs.    Assessment Review of patient status, including review of consultants reports, relevant laboratory and other test results, and collaboration with appropriate care team members and the patient's provider was performed as part of comprehensive patient evaluation and provision of chronic care management services.    Lab Results  Component Value Date   POCGLU 205 (A) 05/19/2019   POCGLU 125 (A) 11/27/2017   POCGLU 114 (A) 02/20/2017    Lab Results  Component Value Date   HGBA1C 10.5 (H) 07/22/2019   HGBA1C 9.7 (H) 04/15/2019   HGBA1C 9.4 (H) 11/15/2018   Lab Results  Component Value Date   MICROALBUR 38.9 11/15/2018   LDLCALC 128 (H) 04/15/2019   CREATININE 1.12 08/15/2019     SDOH (Social Determinants of Health) screening performed today: None. See Care Plan for related entries.   Goals Addressed            This Visit's Progress   . Diabetes management (pt-stated)       Current Barriers:  Marland Kitchen Knowledge Deficits related to basic Diabetes pathophysiology and self care/management . Knowledge Deficits related to medications used for management of diabetes  Case Manager Clinical Goal(s):  Over the next 90 days, patient will demonstrate improved adherence to prescribed treatment plan for diabetes self care/management as evidenced by:  . daily monitoring and recording of CBG  . adherence to prescribed medication regimen . Lowered A1c  Interventions:  . Updated 9/15: Continue Levemir at 32 units QHS; does Emily want to increase Jardiance dose to 25mg ? . Check BG before every meal. Write down in a chart.  . Counseling about ADA diet, exercise . Patient  educated on purpose, proper use and potential adverse effects of Jardiance    Patient Self Care Activities:  . Self administers oral medications as prescribed . Self administers insulin as prescribed . Checks blood sugars as prescribed and utilize hyper and hypoglycemia protocol as needed  Please see past updates related to this goal by clicking on the "Past Updates" button in the selected goal          Telephone follow up appointment with care management team member scheduled for: Friday 9/18 for BG readings and possible Levemir adjustment, double check with Raquel Sarna regarding dose increase of Jardiance to 25mg  (kidney function 9/14- WNL)   Ruben Reason, PharmD Clinical Pharmacist [facility] [phone]

## 2019-08-19 ENCOUNTER — Ambulatory Visit: Payer: Self-pay | Admitting: Pharmacist

## 2019-08-19 DIAGNOSIS — E1169 Type 2 diabetes mellitus with other specified complication: Secondary | ICD-10-CM

## 2019-08-19 DIAGNOSIS — E669 Obesity, unspecified: Secondary | ICD-10-CM

## 2019-08-19 DIAGNOSIS — E1151 Type 2 diabetes mellitus with diabetic peripheral angiopathy without gangrene: Secondary | ICD-10-CM

## 2019-08-19 NOTE — Patient Instructions (Signed)
1. Continue 32 units Levemir SQ daily

## 2019-08-19 NOTE — Chronic Care Management (AMB) (Signed)
  Care Management   Pharmacy Note   08/19/2019 Name: Donald [name] MRN: [id number] DOB: 1966/05/19  Subjective Donald [name] is a 53 y.o. year old male who is a primary care patient of Hubbard Hartshorn, FNP. The care management pharmacist was consulted for assistance with care management and care coordination needs.    Assessment Review of patient status, including review of consultants reports, relevant laboratory and other test results, and collaboration with appropriate care team members and the patient's provider was performed as part of comprehensive patient evaluation and provision of chronic care management services.    Patient's BG readings this week were doing much better, ranging mostly in the 130s and 140s, with a few readings peaking in the 150 range.   Lab Results  Component Value Date   POCGLU 205 (A) 05/19/2019   POCGLU 125 (A) 11/27/2017   POCGLU 114 (A) 02/20/2017    Lab Results  Component Value Date   HGBA1C 10.5 (H) 07/22/2019   HGBA1C 9.7 (H) 04/15/2019   HGBA1C 9.4 (H) 11/15/2018   Lab Results  Component Value Date   MICROALBUR 38.9 11/15/2018   LDLCALC 128 (H) 04/15/2019   CREATININE 1.12 08/15/2019     SDOH (Social Determinants of Health) screening performed today: None. See Care Plan for related entries.   Goals Addressed            This Visit's Progress   . Diabetes management (pt-stated)       Current Barriers:  Marland Kitchen Knowledge Deficits related to basic Diabetes pathophysiology and self care/management . Knowledge Deficits related to medications used for management of diabetes  Case Manager Clinical Goal(s):  Over the next 90 days, patient will demonstrate improved adherence to prescribed treatment plan for diabetes self care/management as evidenced by:  . daily monitoring and recording of CBG  . adherence to prescribed medication regimen . Lowered A1c  Interventions:  . Updated 9/18: Continue Levemir at 32 units QHS;  . Check BG before  every meal. Write down in a chart.  . Counseling about ADA diet, exercise . Patient educated on purpose, proper use and potential adverse effects of Jardiance    Patient Self Care Activities:  . Self administers oral medications as prescribed . Self administers insulin as prescribed . Checks blood sugars as prescribed and utilize hyper and hypoglycemia protocol as needed  Please see past updates related to this goal by clicking on the "Past Updates" button in the selected goal          Telephone follow up appointment with care management team member scheduled for: Tuesday 9/29 for BG readings and possible Levemir adjustment  Ruben Reason, PharmD Clinical Pharmacist [facility] [phone]

## 2019-08-21 ENCOUNTER — Encounter: Payer: Self-pay | Admitting: Family Medicine

## 2019-08-30 ENCOUNTER — Ambulatory Visit: Payer: Self-pay | Admitting: Pharmacist

## 2019-08-30 DIAGNOSIS — E1151 Type 2 diabetes mellitus with diabetic peripheral angiopathy without gangrene: Secondary | ICD-10-CM

## 2019-08-31 DIAGNOSIS — R42 Dizziness and giddiness: Secondary | ICD-10-CM | POA: Diagnosis not present

## 2019-08-31 DIAGNOSIS — R51 Headache: Secondary | ICD-10-CM | POA: Diagnosis not present

## 2019-09-03 NOTE — Chronic Care Management (AMB) (Signed)
  Care Management   Pharmacy Note   09/03/2019- late entry Name: Donald [name] MRN: [id number] DOB: February 04, 1966  Subjective Donald [name] is a 53 y.o. year old male who is a primary care patient of Hubbard Hartshorn, FNP. The care management pharmacist was consulted for assistance with care management and care coordination needs.    Assessment Review of patient status, including review of consultants reports, relevant laboratory and other test results, and collaboration with appropriate care team members and the patient's provider was performed as part of comprehensive patient evaluation and provision of chronic care management services.    Patient's fasting BG readings this week were doing much better, ranging mostly in the 130s, with two readings in the 150s.   Lab Results  Component Value Date   POCGLU 205 (A) 05/19/2019   POCGLU 125 (A) 11/27/2017   POCGLU 114 (A) 02/20/2017    Lab Results  Component Value Date   HGBA1C 10.5 (H) 07/22/2019   HGBA1C 9.7 (H) 04/15/2019   HGBA1C 9.4 (H) 11/15/2018   Lab Results  Component Value Date   MICROALBUR 38.9 11/15/2018   LDLCALC 128 (H) 04/15/2019   CREATININE 1.12 08/15/2019     SDOH (Social Determinants of Health) screening performed today: None. See Care Plan for related entries.   Goals Addressed            This Visit's Progress   . Diabetes management (pt-stated)       Current Barriers:  Marland Kitchen Knowledge Deficits related to basic Diabetes pathophysiology and self care/management . Knowledge Deficits related to medications used for management of diabetes  Case Manager Clinical Goal(s):  Over the next 90 days, patient will demonstrate improved adherence to prescribed treatment plan for diabetes self care/management as evidenced by:  . daily monitoring and recording of CBG  . adherence to prescribed medication regimen . Lowered A1c  Interventions:  . Updated 9/29: Continue Levemir at 34 units QHS;  . Check BG before every  meal. Write down in a chart.  . Counseling about ADA diet, exercise . Patient educated on purpose, proper use and potential adverse effects of Jardiance    Patient Self Care Activities:  . Self administers oral medications as prescribed . Self administers insulin as prescribed . Checks blood sugars as prescribed and utilize hyper and hypoglycemia protocol as needed  Please see past updates related to this goal by clicking on the "Past Updates" button in the selected goal          Telephone follow up appointment with care management team member scheduled for: Tuesday 10/6 for BG readings and possible Levemir adjustment  Ruben Reason, PharmD Clinical Pharmacist [place] [facility] [phone]

## 2019-09-06 ENCOUNTER — Ambulatory Visit: Payer: Self-pay | Admitting: Pharmacist

## 2019-09-06 DIAGNOSIS — E1151 Type 2 diabetes mellitus with diabetic peripheral angiopathy without gangrene: Secondary | ICD-10-CM

## 2019-09-06 DIAGNOSIS — E1169 Type 2 diabetes mellitus with other specified complication: Secondary | ICD-10-CM

## 2019-09-06 DIAGNOSIS — E669 Obesity, unspecified: Secondary | ICD-10-CM

## 2019-09-06 DIAGNOSIS — R51 Headache with orthostatic component, not elsewhere classified: Secondary | ICD-10-CM | POA: Diagnosis not present

## 2019-09-07 ENCOUNTER — Ambulatory Visit: Payer: Self-pay | Admitting: Pharmacist

## 2019-09-09 NOTE — Chronic Care Management (AMB) (Signed)
  Care Management   Note  09/09/2019 Name: Donald [name] MRN: [id number] DOB: 08/11/1966  53 y.o. year old male referred to Chronic Care Management by Raelyn Ensign for insulin management.   Was unable to reach patient via telephone today and have left HIPAA compliant voicemail asking patient to return my call. (unsuccessful outreach #1).  Ruben Reason, PharmD Clinical Pharmacist [facility] [phone]

## 2019-09-09 NOTE — Chronic Care Management (AMB) (Signed)
  Care Management   Note  09/09/2019- late entry Name: Donald [name] MRN: [id number] DOB: Jun 11, 1966  Telephone outreach to patient. HIPAA identifiers verified.  Patient is not feeling well (intractable headache) and requests call tomorrow.    Telephone follow up appointment with care management team member scheduled for: October 7  Ruben Reason, PharmD Clinical Pharmacist [facility] [employer] [phone]

## 2019-09-13 ENCOUNTER — Ambulatory Visit: Payer: Self-pay | Admitting: Pharmacist

## 2019-09-13 DIAGNOSIS — E1151 Type 2 diabetes mellitus with diabetic peripheral angiopathy without gangrene: Secondary | ICD-10-CM

## 2019-09-13 DIAGNOSIS — E1169 Type 2 diabetes mellitus with other specified complication: Secondary | ICD-10-CM

## 2019-09-13 NOTE — Chronic Care Management (AMB) (Signed)
  Care Management   Pharmacy Note   09/13/2019- late entry Name: ROBYN NOHR MRN: [id number] DOB: [birth year]  Subjective Dorwin D Regala is a 53 y.o. year old male who is a primary care patient of Hubbard Hartshorn, FNP. The care management pharmacist was consulted for assistance with care management and care coordination needs.    Assessment Review of patient status, including review of consultants reports, relevant laboratory and other test results, and collaboration with appropriate care team members and the patient's provider was performed as part of comprehensive patient evaluation and provision of chronic care management services.    Patient's fasting BG readings this week ranged mostly in the 130s, with two readings in the 140s.   Lab Results  Component Value Date   POCGLU 205 (A) 05/19/2019   POCGLU 125 (A) 11/27/2017   POCGLU 114 (A) 02/20/2017    Lab Results  Component Value Date   HGBA1C 10.5 (H) 07/22/2019   HGBA1C 9.7 (H) 04/15/2019   HGBA1C 9.4 (H) 11/15/2018   Lab Results  Component Value Date   MICROALBUR 38.9 11/15/2018   LDLCALC 128 (H) 04/15/2019   CREATININE 1.12 08/15/2019     SDOH (Social Determinants of Health) screening performed today: None. See Care Plan for related entries.   Goals Addressed            This Visit's Progress   . Diabetes management (pt-stated)       Current Barriers:  Marland Kitchen Knowledge Deficits related to basic Diabetes pathophysiology and self care/management . Knowledge Deficits related to medications used for management of diabetes  Case Manager Clinical Goal(s):  Over the next 90 days, patient will demonstrate improved adherence to prescribed treatment plan for diabetes self care/management as evidenced by:  . daily monitoring and recording of CBG  . adherence to prescribed medication regimen . Lowered A1c  Interventions:  . Updated 10/13: Continue Levemir at 36 units QHS;  . Check BG before every meal. Write down in a  chart.  . Counseling about ADA diet, exercise . Patient educated on purpose, proper use and potential adverse effects of Jardiance    Patient Self Care Activities:  . Self administers oral medications as prescribed . Self administers insulin as prescribed . Checks blood sugars as prescribed and utilize hyper and hypoglycemia protocol as needed  Please see past updates related to this goal by clicking on the "Past Updates" button in the selected goal          Telephone follow up appointment with care management team member scheduled for: Tuesday 10/20 for BG readings and possible Levemir adjustment  Ruben Reason, PharmD Clinical Pharmacist [facility] [phone]

## 2019-09-14 ENCOUNTER — Encounter: Payer: Self-pay | Admitting: Family Medicine

## 2019-09-24 ENCOUNTER — Other Ambulatory Visit: Payer: Self-pay | Admitting: Family Medicine

## 2019-09-24 DIAGNOSIS — E782 Mixed hyperlipidemia: Secondary | ICD-10-CM

## 2019-09-24 DIAGNOSIS — E1151 Type 2 diabetes mellitus with diabetic peripheral angiopathy without gangrene: Secondary | ICD-10-CM

## 2019-09-24 DIAGNOSIS — Z9189 Other specified personal risk factors, not elsewhere classified: Secondary | ICD-10-CM

## 2019-09-24 NOTE — Telephone Encounter (Signed)
Requested Prescriptions  Pending Prescriptions Disp Refills  . VASCEPA 1 g CAPS [Pharmacy Med Name: VASCEPA 1 GM CAPSULE] 360 capsule 0    Sig: TAKE 2 CAPSULES (2 G TOTAL) BY MOUTH 2 (TWO) TIMES A DAY.     Off-Protocol Failed - 09/24/2019  2:48 AM      Failed - Medication not assigned to a protocol, review manually.      Passed - Valid encounter within last 12 months    Recent Outpatient Visits          1 month ago DM (diabetes mellitus), type 2 with peripheral vascular complications [facility])   [place], FNP   2 months ago New daily persistent headache   [place], FNP   3 months ago New daily persistent headache   [place], FNP   4 months ago New daily persistent headache   [place], FNP   5 months ago Essential hypertension   [place], Astrid Divine, FNP      Future Appointments            In 3 weeks Hubbard Hartshorn, [facility], [facility]

## 2019-09-27 ENCOUNTER — Ambulatory Visit: Payer: Self-pay | Admitting: Physical Therapy

## 2019-10-04 ENCOUNTER — Encounter: Payer: Self-pay | Admitting: Physical Therapy

## 2019-10-05 DIAGNOSIS — H4710 Unspecified papilledema: Secondary | ICD-10-CM | POA: Diagnosis not present

## 2019-10-11 ENCOUNTER — Encounter: Payer: Self-pay | Admitting: Physical Therapy

## 2019-10-17 ENCOUNTER — Ambulatory Visit: Payer: BC Managed Care – PPO | Admitting: Family Medicine

## 2019-10-17 ENCOUNTER — Encounter: Payer: Self-pay | Admitting: Family Medicine

## 2019-10-17 ENCOUNTER — Other Ambulatory Visit: Payer: Self-pay

## 2019-10-17 ENCOUNTER — Telehealth: Payer: Self-pay | Admitting: Family Medicine

## 2019-10-17 VITALS — BP 132/78 | HR 98 | Temp 97.9°F | Resp 18 | Ht 70.0 in | Wt 224.6 lb

## 2019-10-17 DIAGNOSIS — G4452 New daily persistent headache (NDPH): Secondary | ICD-10-CM

## 2019-10-17 DIAGNOSIS — R12 Heartburn: Secondary | ICD-10-CM

## 2019-10-17 DIAGNOSIS — R42 Dizziness and giddiness: Secondary | ICD-10-CM

## 2019-10-17 DIAGNOSIS — E669 Obesity, unspecified: Secondary | ICD-10-CM

## 2019-10-17 DIAGNOSIS — E1169 Type 2 diabetes mellitus with other specified complication: Secondary | ICD-10-CM

## 2019-10-17 DIAGNOSIS — E785 Hyperlipidemia, unspecified: Secondary | ICD-10-CM

## 2019-10-17 DIAGNOSIS — F17200 Nicotine dependence, unspecified, uncomplicated: Secondary | ICD-10-CM

## 2019-10-17 DIAGNOSIS — I10 Essential (primary) hypertension: Secondary | ICD-10-CM

## 2019-10-17 NOTE — Telephone Encounter (Signed)
Donald [name] - patient lost his job and no longer has insurance.  Could you help him with applications for Jardiance and any other medications that he could possibly obtain for low cost or [name] if he needs.

## 2019-10-17 NOTE — Patient Instructions (Addendum)
Goal blood pressure is to be less than 140/90. Goodrx.com - for coupons for your prescriptions.  Diabetes Mellitus and Nutrition, Adult When you have diabetes (diabetes mellitus), it is very important to have healthy eating habits because your blood sugar (glucose) levels are greatly affected by what you eat and drink. Eating healthy foods in the appropriate amounts, at about the same times every day, can help you:  Control your blood glucose.  Lower your risk of heart disease.  Improve your blood pressure.  Reach or maintain a healthy weight. Every person with diabetes is different, and each person has different needs for a meal plan. Your health care provider may recommend that you work with a diet and nutrition specialist (dietitian) to make a meal plan that is best for you. Your meal plan may vary depending on factors such as:  The calories you need.  The medicines you take.  Your weight.  Your blood glucose, blood pressure, and cholesterol levels.  Your activity level.  Other health conditions you have, such as heart or kidney disease. How do carbohydrates affect me? Carbohydrates, also called carbs, affect your blood glucose level more than any other type of food. Eating carbs naturally raises the amount of glucose in your blood. Carb counting is a method for keeping track of how many carbs you eat. Counting carbs is important to keep your blood glucose at a healthy level, especially if you use insulin or take certain oral diabetes medicines. It is important to know how many carbs you can safely have in each meal. This is different for every person. Your dietitian can help you calculate how many carbs you should have at each meal and for each snack. Foods that contain carbs include:  Bread, cereal, rice, pasta, and crackers.  Potatoes and corn.  Peas, beans, and lentils.  Milk and yogurt.  Fruit and juice.  Desserts, such as cakes, cookies, ice cream, and candy. How  does alcohol affect me? Alcohol can cause a sudden decrease in blood glucose (hypoglycemia), especially if you use insulin or take certain oral diabetes medicines. Hypoglycemia can be a life-threatening condition. Symptoms of hypoglycemia (sleepiness, dizziness, and confusion) are similar to symptoms of having too much alcohol. If your health care provider says that alcohol is safe for you, follow these guidelines:  Limit alcohol intake to no more than 1 drink per day for nonpregnant women and 2 drinks per day for men. One drink equals 12 oz of beer, 5 oz of wine, or 1 oz of hard liquor.  Do not drink on an empty stomach.  Keep yourself hydrated with water, diet soda, or unsweetened iced tea.  Keep in mind that regular soda, juice, and other mixers may contain a lot of sugar and must be counted as carbs. What are tips for following this plan?  Reading food labels  Start by checking the serving size on the "Nutrition Facts" label of packaged foods and drinks. The amount of calories, carbs, fats, and other nutrients listed on the label is based on one serving of the item. Many items contain more than one serving per package.  Check the total grams (g) of carbs in one serving. You can calculate the number of servings of carbs in one serving by dividing the total carbs by 15. For example, if a food has 30 g of total carbs, it would be equal to 2 servings of carbs.  Check the number of grams (g) of saturated and trans fats in one  serving. Choose foods that have low or no amount of these fats.  Check the number of milligrams (mg) of salt (sodium) in one serving. Most people should limit total sodium intake to less than 2,300 mg per day.  Always check the nutrition information of foods labeled as "low-fat" or "nonfat". These foods may be higher in added sugar or refined carbs and should be avoided.  Talk to your dietitian to identify your daily goals for nutrients listed on the  label. Shopping  Avoid buying canned, premade, or processed foods. These foods tend to be high in fat, sodium, and added sugar.  Shop around the outside edge of the grocery store. This includes fresh fruits and vegetables, bulk grains, fresh meats, and fresh dairy. Cooking  Use low-heat cooking methods, such as baking, instead of high-heat cooking methods like deep frying.  Cook using healthy oils, such as olive, canola, or sunflower oil.  Avoid cooking with butter, cream, or high-fat meats. Meal planning  Eat meals and snacks regularly, preferably at the same times every day. Avoid going long periods of time without eating.  Eat foods high in fiber, such as fresh fruits, vegetables, beans, and whole grains. Talk to your dietitian about how many servings of carbs you can eat at each meal.  Eat 4-6 ounces (oz) of lean protein each day, such as lean meat, chicken, fish, eggs, or tofu. One oz of lean protein is equal to: ? 1 oz of meat, chicken, or fish. ? 1 egg. ?  cup of tofu.  Eat some foods each day that contain healthy fats, such as avocado, nuts, seeds, and fish. Lifestyle  Check your blood glucose regularly.  Exercise regularly as told by your health care provider. This may include: ? 150 minutes of moderate-intensity or vigorous-intensity exercise each week. This could be brisk walking, biking, or water aerobics. ? Stretching and doing strength exercises, such as yoga or weightlifting, at least 2 times a week.  Take medicines as told by your health care provider.  Do not use any products that contain nicotine or tobacco, such as cigarettes and e-cigarettes. If you need help quitting, ask your health care provider.  Work with a Social worker or diabetes educator to identify strategies to manage stress and any emotional and social challenges. Questions to ask a health care provider  Do I need to meet with a diabetes educator?  Do I need to meet with a dietitian?  What  number can I call if I have questions?  When are the best times to check my blood glucose? Where to find more information:  American Diabetes Association: diabetes.org  Academy of Nutrition and Dietetics: www.eatright.[employer] of Diabetes and Digestive and Kidney Diseases (NIH): [employer] Summary  A healthy meal plan will help you control your blood glucose and maintain a healthy lifestyle.  Working with a diet and nutrition specialist (dietitian) can help you make a meal plan that is best for you.  Keep in mind that carbohydrates (carbs) and alcohol have immediate effects on your blood glucose levels. It is important to count carbs and to use alcohol carefully. This information is not intended to replace advice given to you by your health care provider. Make sure you discuss any questions you have with your health care provider. Document Released: 08/14/2005 Document Revised: 10/30/2017 Document Reviewed: 12/22/2016 Elsevier Patient Education  2020 [employer].

## 2019-10-17 NOTE — Progress Notes (Signed)
Name: Donald [name]   MRN: [id number]    DOB: 1966/08/30   Date:10/17/2019       Progress Note  Subjective  Chief Complaint  Chief Complaint  Patient presents with  . Follow-up    2 month recheck    HPI  Daily Headaches and Lightheadedness:  He has been experiencing this forabout62monthnow. He also noted history of irregular heart rhythm which prompted a referral to cardiology.He saw Dr. CClayborn Bignessand was determined the symptoms did not seem to be related to cardiac etiology. He was then referred to Dr. SManuella Ghaziwho prescribed fioricet PRN and recommended magnesium supplement. Dr. SManuella Ghazialso recommended he see [facility] and placed referral- he saw ENT who did not feel this was vestibular in nature and referred back to neurology/cardiology. MRI and CT scan with Dr. SManuella Ghaziwere WNL.  He has been seeing AGayland CurryPA-C with Dr. STrena Plattoffice, last visit was 08/31/2019 and was referred to PT for vesibular therapy; she also suggested that his A1C being out of control may be a key contributor to his headaches.  States that since BG's have been better - less frequent and less painful; however the pain is lasting longer now - about 5-163mutes instead of a few seconds at a time. - He saw Dr. KiEdison Paceith AlKittanningn 08/12/2019 and was told he had some abnormalities in his eyes indicative of pseudotumor cerebri.  He went back last week and had some testing done - no changes were made per his report.  We will request records today.  DM:BG's had beenback up around 190-200's at 22units- has been working with JuAlmyra FreeharmD with CCM to titrate up his basal insulin and is now up to 34 units levemir daily and BG's have been between 115-160. Also on Metformin max dose; started Jardiance 2542mt last visit; GLP-1 was $500.  His diet has been healthy for the most part - not eating a lot of carbs or sweets. Taking ACE-I, statin, aspirin, and vascepa. Denies polydipsia, polyuria, or polyphagia.  Urine  Micro due after December 2020.    HLD: Taking vasepa, crestor 40m27mnd ASA 81mg54mly.  Due for labs today.  Denies myalgias or chest pain.  HTN: BP is at goal today; taking lisinopril 10mg 43mtolerating without issue.  Headaches as detailed above.  Denies chest pain, shortness of breath, or palpitations.  Does check at home and states it has been normal  GERD: Not taking omeprazole any longer.  Now he just avoids spicy foods.  No abdominal pain, blood in stool, dark and tarry stool.  Obesity: No longer working, but is trying to stay active while also staying safe and avoiding headaches or syncopal episodes.  Diet is balanced, working on diabetic diet. May have orange juice once a month, otherwise no sweet beverages.  Tobacco Abuse: He has decreased down to 1/2ppd from 1ppd.  Not ready to quit completely.  He also notes having stopped alcohol completely 6 months.   Patient Active Problem List   Diagnosis Date Noted  . Diabetes mellitus type 2 in obese (HCC) 1[place]3/2019  . 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 Obesity (BMI 30.0-34.9) 05/27/2018  . Edema of esophagus   . Angiodysplasia of stomach and duodenum   . Stomach irritation   . Heartburn   . Smoker 05/11/2018  . Medication monitoring encounter 02/26/2018  . Hypertension 09/15/2017  . Ingrown toenail 07/06/2017  . Peripheral vascular insufficiency (HCC) 0[place]6/2018  . Need for hepatitis C screening  test 03/27/2017  . Hyperlipidemia 10/31/2016    Past Surgical History:  Procedure Laterality Date  . ESOPHAGOGASTRODUODENOSCOPY (EGD) WITH PROPOFOL N/A 05/26/2018   Procedure: ESOPHAGOGASTRODUODENOSCOPY (EGD) WITH PROPOFOL;  Surgeon: Virgel Manifold, MD;  Location: [facility] ENDOSCOPY;  Service: Endoscopy;  Laterality: N/A;  . KNEE SURGERY Right     Family History  Problem Relation Age of Onset  . Hypertension Father   . Diabetes Father   . Liver disease Father   . Diverticulitis Mother   . Migraines Mother   . Cancer Sister         tumors in the breast   . Diabetes Maternal Grandfather   . Cancer Sister        tumors in the breast   . Cancer Sister        tumor in the breast     Social History   Socioeconomic History  . Marital status: Married    Spouse name: Not on file  . Number of children: Not on file  . Years of education: Not on file  . Highest education level: Not on file  Occupational History  . Not on file  Social Needs  . Financial resource strain: Not on file  . Food insecurity    Worry: Not on file    Inability: Not on file  . Transportation needs    Medical: Not on file    Non-medical: Not on file  Tobacco Use  . Smoking status: Current Every Day Smoker    Packs/day: 1.00    Years: 30.00    Pack years: 30.00    Types: Cigarettes  . Smokeless tobacco: Never Used  Substance and Sexual Activity  . Alcohol use: Yes    Alcohol/week: 0.0 standard drinks    Comment: none in the last 3 weeks  . Drug use: No  . Sexual activity: Yes    Partners: Female  Lifestyle  . Physical activity    Days per week: Not on file    Minutes per session: Not on file  . Stress: Not on file  Relationships  . Social Herbalist on phone: Not on file    Gets together: Not on file    Attends religious service: Not on file    Active member of club or organization: Not on file    Attends meetings of clubs or organizations: Not on file    Relationship status: Not on file  . Intimate partner violence    Fear of current or ex partner: Not on file    Emotionally abused: Not on file    Physically abused: Not on file    Forced sexual activity: Not on file  Other Topics Concern  . Not on file  Social History Narrative  . Not on file     Current Outpatient Medications:  .  aspirin EC 81 MG EC tablet, Take 1 tablet (81 mg total) by mouth daily., Disp: , Rfl:  .  BD PEN NEEDLE MICRO U/F 32G X 6 MM MISC, USE FOR LEVEMIR INJECTION DAILY, Disp: 100 each, Rfl: 0 .  Blood Glucose Monitoring Suppl  (D-CARE GLUCOMETER) w/Device KIT, 1 Units by Does not apply route 4 (four) times daily -  before meals and at bedtime., Disp: 1 kit, Rfl: 0 .  empagliflozin (JARDIANCE) 25 MG TABS tablet, Take 25 mg by mouth daily before breakfast., Disp: 90 tablet, Rfl: 1 .  Insulin Detemir (LEVEMIR FLEXTOUCH) 100 UNIT/ML Pen, Inject 32-60 Units into the  skin daily., Disp: 15 mL, Rfl:  .  lisinopril (ZESTRIL) 10 MG tablet, Take 0.5 tablets (5 mg total) by mouth daily., Disp: 90 tablet, Rfl: 3 .  meloxicam (MOBIC) 7.5 MG tablet, Take 1 tablet (7.5 mg total) by mouth daily as needed for pain., Disp: 30 tablet, Rfl: 0 .  omeprazole (PRILOSEC) 20 MG capsule, Take 20 mg by mouth daily. , Disp: , Rfl:  .  rosuvastatin (CRESTOR) 20 MG tablet, TAKE 1 TABLET (20 MG TOTAL) BY MOUTH AT BEDTIME. FOR CHOLESTEROL THIS REPLACES SIMVASTATIN, Disp: 90 tablet, Rfl: 0 .  topiramate (TOPAMAX) 25 MG tablet, Take 25 mg nightly for one week then increase to 25 mg two times a day (every 12 hours) for one week then increase to 25 mg in the morning and 50 mg at night for one week then increase to 50 mg two times a day (every 12 hours), Disp: , Rfl:  .  VASCEPA 1 g CAPS, TAKE 2 CAPSULES (2 G TOTAL) BY MOUTH 2 (TWO) TIMES A DAY., Disp: 360 capsule, Rfl: 0 .  Vitamin D, Ergocalciferol, (DRISDOL) 1.25 MG (50000 UT) CAPS capsule, Take 50,000 Units by mouth once a week., Disp: , Rfl:  .  metFORMIN (GLUCOPHAGE) 1000 MG tablet, Take 1 tablet (1,000 mg total) by mouth 2 (two) times daily with a meal., Disp: 180 tablet, Rfl: 0  Allergies  Allergen Reactions  . No Known Allergies     I personally reviewed active problem list, medication list, allergies, health maintenance, notes from last encounter, lab results with the patient/caregiver today.   ROS  Ten systems reviewed and is negative except as mentioned in HPI  Objective  Vitals:   10/17/19 0836  BP: 132/78  Pulse: 98  Resp: 18  Temp: 97.9 F (36.6 C)  TempSrc: Temporal  SpO2: 96%   Weight: 224 lb 9.6 oz (101.9 kg)  Height: _0  (1.778 m)    Body mass index is 32.23 kg/m.  Physical Exam  Constitutional: Patient appears well-developed and well-nourished. No distress.  HENT: Head: Normocephalic and atraumatic.  Eyes: Conjunctivae and EOM are normal. No scleral icterus.   Neck: Normal range of motion. Neck supple. No JVD present.  Cardiovascular: Normal rate, regular rhythm and normal heart sounds.  No murmur heard. No BLE edema. Pulmonary/Chest: Effort normal and breath sounds normal. No respiratory distress. Musculoskeletal: Normal range of motion, no joint effusions. No gross deformities Neurological: Pt is alert and oriented to person, place, and time. No cranial nerve deficit. Coordination, balance, strength, speech and gait are normal.  Skin: Skin is warm and dry. No rash noted. No erythema.  Psychiatric: Patient has a normal mood and affect. behavior is normal. Judgment and thought content normal.  No results found for this or any previous visit (from the past 72 hour(s)).  PHQ2/9: Depression screen [facility] 2/9 10/17/2019 08/15/2019 07/22/2019 05/31/2019 05/19/2019  Decreased Interest 0 0 0 0 0  Down, Depressed, Hopeless 0 0 0 0 0  PHQ - 2 Score 0 0 0 0 0  Altered sleeping 0 2 0 0 0  Tired, decreased energy 0 0 0 0 0  Change in appetite 0 0 0 0 0  Feeling bad or failure about yourself  0 0 0 0 0  Trouble concentrating 0 0 0 0 0  Moving slowly or fidgety/restless 0 0 0 0 0  Suicidal thoughts 0 0 0 0 0  PHQ-9 Score 0 2 0 0 0  Difficult doing work/chores Not difficult at  all Not difficult at all Not difficult at all Not difficult at all Not difficult at all  Some recent data might be hidden   PHQ-2/9 Result is negative.    Fall Risk: Fall Risk  10/17/2019 08/15/2019 07/22/2019 05/31/2019 05/19/2019  Falls in the past year? 0 0 0 0 1  Number falls in past yr: 0 0 0 0 1  Injury with Fall? 0 0 0 0 0  Follow up Falls evaluation completed Falls evaluation  completed Falls evaluation completed Falls evaluation completed Falls evaluation completed    Assessment & Plan  1. New daily persistent headache - Doing a bit better; seeing neurology at this time and taking Topamax.  BG's and BP are under better control.  2. Episodic lightheadedness - COMPLETE METABOLIC PANEL WITH GFR  3. Diabetes mellitus type 2 in obese (HCC) - BG's have been much improved with insulin adjustment and higher dose of Jardiance. - COMPLETE METABOLIC PANEL WITH GFR - Hemoglobin A1c  4. Hyperlipidemia associated with type 2 diabetes mellitus ([place]) - Continue statin therapy and vascepa. - Lipid panel  5. Essential hypertension - At goal today; continue lisinopril. - COMPLETE METABOLIC PANEL WITH GFR  6. Heartburn - Diet controlled only  7. Smoker - Encouraged cessation; he is not ready at this time.

## 2019-10-18 ENCOUNTER — Telehealth: Payer: Self-pay | Admitting: Family Medicine

## 2019-10-18 ENCOUNTER — Ambulatory Visit: Payer: Self-pay | Admitting: Pharmacist

## 2019-10-18 ENCOUNTER — Encounter: Payer: Self-pay | Admitting: Physical Therapy

## 2019-10-18 ENCOUNTER — Other Ambulatory Visit: Payer: Self-pay | Admitting: Family Medicine

## 2019-10-18 DIAGNOSIS — E1169 Type 2 diabetes mellitus with other specified complication: Secondary | ICD-10-CM

## 2019-10-18 LAB — COMPLETE METABOLIC PANEL WITH GFR
AG Ratio: 1.1 (calc) (ref 1.0–2.5)
ALT: 13 U/L (ref 9–46)
AST: 9 U/L — ABNORMAL LOW (ref 10–35)
Albumin: 4.1 g/dL (ref 3.6–5.1)
Alkaline phosphatase (APISO): 86 U/L (ref 35–144)
BUN: 12 mg/dL (ref 7–25)
CO2: 30 mmol/L (ref 20–32)
Calcium: 9.8 mg/dL (ref 8.6–10.3)
Chloride: 98 mmol/L (ref 98–110)
Creat: 1.03 mg/dL (ref 0.70–1.33)
GFR, Est African American: 94 mL/min/{1.73_m2} (ref >=60)
GFR, Est Non African American: 81 mL/min/{1.73_m2} (ref >=60)
Globulin: 3.6 g/dL (calc) (ref 1.9–3.7)
Glucose, Bld: 329 mg/dL — ABNORMAL HIGH (ref 65–99)
Potassium: 4.6 mmol/L (ref 3.5–5.3)
Sodium: 137 mmol/L (ref 135–146)
Total Bilirubin: 0.3 mg/dL (ref 0.2–1.2)
Total Protein: 7.7 g/dL (ref 6.1–8.1)

## 2019-10-18 LAB — LIPID PANEL
Cholesterol: 253 mg/dL — ABNORMAL HIGH (ref <200)
HDL: 39 mg/dL — ABNORMAL LOW (ref >=40)
LDL Cholesterol (Calc): 157 mg/dL (calc) — ABNORMAL HIGH
Non-HDL Cholesterol (Calc): 214 mg/dL (calc) — ABNORMAL HIGH (ref <130)
Total CHOL/HDL Ratio: 6.5 (calc) — ABNORMAL HIGH (ref <5.0)
Triglycerides: 372 mg/dL — ABNORMAL HIGH (ref <150)

## 2019-10-18 LAB — HEMOGLOBIN A1C
Hgb A1c MFr Bld: 10 % of total Hgb — ABNORMAL HIGH (ref <5.7)
Mean Plasma Glucose: 240 (calc)
eAG (mmol/L): 13.3 (calc)

## 2019-10-18 MED ORDER — ROSUVASTATIN CALCIUM 40 MG PO TABS: 40.0000 mg | ORAL_TABLET | Freq: Every day | ORAL | 3 refills | Status: DC

## 2019-10-18 NOTE — Telephone Encounter (Signed)
Thank you for putting him back on my radar! Scheduled FULL pharmacy appointment with patient for next Tuesday  Ruben Reason, PharmD Clinical Pharmacist [facility] [phone]

## 2019-10-18 NOTE — Chronic Care Management (AMB) (Signed)
  Care Management   Note  10/18/2019 Name: Donald [name] MRN: [id number] DOB: 24-Jul-1966  Outreach call to patient to re-establish pharmacy care. HIPAA identifiers verified.   Scheduled a face to face visit to review all medications and medication assistance options for patient next Tuesday, November 24 at 9 am  Follow up: Face to Face appointment with care management team member scheduled for:  Tuesday, November 24  Ruben Reason, PharmD Clinical Pharmacist [facility] [phone]

## 2019-10-18 NOTE — Telephone Encounter (Signed)
Donald [name] - could you please give this patient a call to restart your weekly or every 2 week check in to titrate insulin by 2 units every 3 days with max dose of 60 prior to needing to check back in with me?  I'd also like to get him started on Trulicity if he is a good candidate - I believe I tried to order Ozempic in the past, but it was too expensive.  Let me know if you can help facilitate this. Thank you!

## 2019-10-18 NOTE — Telephone Encounter (Signed)
Yes- patient scheduled for full pharmacy appointment 11/24. We will plan on discussing Ozempic and complete assistance forms for NovoNordisk.   Ruben Reason, PharmD Clinical Pharmacist [facility] [phone]

## 2019-10-19 ENCOUNTER — Encounter: Payer: Self-pay | Admitting: Family Medicine

## 2019-10-25 ENCOUNTER — Encounter: Payer: Self-pay | Admitting: Physical Therapy

## 2019-10-25 ENCOUNTER — Ambulatory Visit: Payer: Self-pay

## 2019-10-25 MED ORDER — TRULICITY 0.75 MG/0.5ML ~~LOC~~ SOAJ: 0.7500 mg | SUBCUTANEOUS | 3 refills | Status: DC

## 2019-11-01 ENCOUNTER — Other Ambulatory Visit: Payer: Self-pay | Admitting: Family Medicine

## 2019-11-01 ENCOUNTER — Encounter: Payer: Self-pay | Admitting: Physical Therapy

## 2019-11-01 DIAGNOSIS — Z20822 Contact with and (suspected) exposure to covid-19: Secondary | ICD-10-CM

## 2019-11-04 ENCOUNTER — Telehealth: Payer: Self-pay | Admitting: Family Medicine

## 2019-11-04 LAB — NOVEL CORONAVIRUS, NAA: SARS-CoV-2, NAA: NOT DETECTED

## 2019-11-04 NOTE — Chronic Care Management (AMB) (Signed)
Care Management Note  11/04/2019  Name: Donald [name] MRN: [id number] D.O.B: 06/15/1966   Donald [name] is a 53 y.o. male who is a primary care patient of Hubbard Hartshorn, FNP. Donald [name] is currently enrolled in care management services. An additional referral for Pharm D was placed.  Follow up plan: Telephone appointment with CM team member scheduled for 11/09/2019  Glenna Durand, LPN Health Advisor, Embedded Care Coordination [place] [facility] Management ??[email or web address] ??[phone]

## 2019-11-08 ENCOUNTER — Encounter: Payer: Self-pay | Admitting: Physical Therapy

## 2019-11-09 ENCOUNTER — Ambulatory Visit: Payer: Self-pay | Admitting: Pharmacist

## 2019-11-22 ENCOUNTER — Other Ambulatory Visit: Payer: Self-pay | Admitting: Family Medicine

## 2019-11-22 DIAGNOSIS — E7800 Pure hypercholesterolemia, unspecified: Secondary | ICD-10-CM

## 2019-11-22 DIAGNOSIS — E1151 Type 2 diabetes mellitus with diabetic peripheral angiopathy without gangrene: Secondary | ICD-10-CM

## 2019-12-16 NOTE — Chronic Care Management (AMB) (Signed)
  Care Management   Note  12/16/2019- late entry Name: Donald [name] MRN: [id number] DOB: 02-Jul-1966  54 y.o. year old male referred to Care Management by Maurice Small, FNP for clinical pharmacy services  Was unable to reach patient via telephone today and have left HIPAA compliant voicemail asking patient to return my call. (unsuccessful outreach #2).   Follow up plan:  A HIPPA compliant phone message was left for the patient providing contact information and requesting a return call.   Karalee Height, PharmD Clinical Pharmacist [facility] [phone]

## 2020-01-13 ENCOUNTER — Other Ambulatory Visit: Payer: Self-pay | Admitting: Emergency Medicine

## 2020-01-13 DIAGNOSIS — R42 Dizziness and giddiness: Secondary | ICD-10-CM

## 2020-01-13 DIAGNOSIS — G4452 New daily persistent headache (NDPH): Secondary | ICD-10-CM

## 2020-01-13 NOTE — Progress Notes (Unsigned)
Patient sent FMLA paperwork for functional capacity limitations. Patient has been informed that he may have to have testing and verbalized understanding

## 2020-01-13 NOTE — Progress Notes (Signed)
Discussed with Miel.

## 2020-01-17 ENCOUNTER — Encounter: Payer: Self-pay | Admitting: Family Medicine

## 2020-01-17 ENCOUNTER — Other Ambulatory Visit: Payer: Self-pay

## 2020-01-17 ENCOUNTER — Ambulatory Visit (INDEPENDENT_AMBULATORY_CARE_PROVIDER_SITE_OTHER): Payer: 59 | Admitting: Family Medicine

## 2020-01-17 ENCOUNTER — Telehealth: Payer: Self-pay | Admitting: Family Medicine

## 2020-01-17 VITALS — BP 122/70 | HR 113 | Temp 97.3°F | Resp 16 | Ht 71.5 in | Wt 217.5 lb

## 2020-01-17 DIAGNOSIS — I10 Essential (primary) hypertension: Secondary | ICD-10-CM | POA: Diagnosis not present

## 2020-01-17 DIAGNOSIS — Z794 Long term (current) use of insulin: Secondary | ICD-10-CM

## 2020-01-17 DIAGNOSIS — IMO0002 Reserved for concepts with insufficient information to code with codable children: Secondary | ICD-10-CM

## 2020-01-17 DIAGNOSIS — Z5181 Encounter for therapeutic drug level monitoring: Secondary | ICD-10-CM

## 2020-01-17 DIAGNOSIS — E785 Hyperlipidemia, unspecified: Secondary | ICD-10-CM

## 2020-01-17 DIAGNOSIS — E1165 Type 2 diabetes mellitus with hyperglycemia: Secondary | ICD-10-CM

## 2020-01-17 DIAGNOSIS — E1169 Type 2 diabetes mellitus with other specified complication: Secondary | ICD-10-CM

## 2020-01-17 DIAGNOSIS — F17200 Nicotine dependence, unspecified, uncomplicated: Secondary | ICD-10-CM

## 2020-01-17 DIAGNOSIS — G4452 New daily persistent headache (NDPH): Secondary | ICD-10-CM

## 2020-01-17 NOTE — Chronic Care Management (AMB) (Signed)
   Care Management   Note  01/17/2020 Name: Donald [name] MRN: [id number] DOB: Jul 17, 1966  Donald [name] is a 54 y.o. year old male who is a primary care patient of Doren Custard, FNP. Donald [name] is currently enrolled in care management services. An additional referral for Pharm D was placed.   Follow up plan: Telephone appointment with care management team member scheduled for:01/18/2020  Elisha Ponder, LPN Health Advisor, Embedded Care Coordination [facility] Care Management ??[email or web address] ??[phone]

## 2020-01-17 NOTE — Progress Notes (Signed)
Name: Donald PLEMMONS   MRN: [id number]    DOB: Oct 21, 1966   Date:01/17/2020       Progress Note  No chief complaint on file.    Subjective:   Donald [name] is a 54 y.o. male, presents to clinic for routine follow up on the conditions listed above.    Seeing Neuro - HA same frequency but more severe pain, despite tx, waiting on sleep study Brings in disability paperwork.  Was on STD, but that expired, now LTD, lost job  Daily Headaches and Lightheadedness: interval hx from PCP He has been experiencing this forabout56monthnow. He also noted history of irregular heart rhythm which prompted a referral to cardiology.He saw Dr. CClayborn Bignessand was determined the symptoms did not seem to be related to cardiac etiology. He was then referred to Dr. SManuella Ghaziwho prescribed fioricet PRN and recommended magnesium supplement. Dr. SManuella Ghazialso recommended he see [facility] and placed referral-he saw ENT who did not feel this was vestibular in nature and referred back to neurology/cardiology.MRI and CT scan with Dr. SManuella Ghaziwere WNL. He has been seeing AGayland CurryPA-C with Dr. STrena Plattoffice, last visit was 08/31/2019 and was referred to PT for vesibular therapy; she also suggested that his A1C being out of control may be a key contributor to his headaches.  States that since BG's have been better - less frequent and less painful; however the pain is lasting longer now - about 5-145mutes instead of a few seconds at a time. - HesawDr. KiEdison Paceith AlChinookn 08/12/2019 and was told he had some abnormalities in his eyes indicative of pseudotumor cerebri. He went back last week and had some testing done - no changes were made per his report.  We will request records today.  Patient states that his headaches remain the same frequency but are much more severe.  Patient states he is waiting on sleep study approval and now that he has new insurance he hopes to get this approved to continue evaluation for his  headaches.  He brings in paperwork for long-term disability here which is requesting documentation of visits from January 2021 and on and he has not been here except for today.  Diabetes Mellitus Type II: Currently managing with trulicity, levemir, metformin, and Levemir levemir ran out due to cost/insurance, levemir was 20 units trulicity - never got, not taking Metformin 1000 mg BID Jardiance almost out, but is taking 25 mg daily Pt has no SE from meds. Fasting CBGs typically run 200's  recent high CBG 430 and low CBG when he was on insulin 130-140.   No hypoglycemic episodes Denies: Polyuria, polydipsia, polyphagia, vision changes, or neuropathy some weight loss since last visit 3 months ago Wt Readings from Last 5 Encounters:  01/17/20 217 lb 8 oz (98.7 kg)  10/17/19 224 lb 9.6 oz (101.9 kg)  08/15/19 216 lb 6.4 oz (98.2 kg)  07/22/19 223 lb 3.2 oz (101.2 kg)  05/31/19 226 lb 4.8 oz (102.6 kg)  Last several A1C Lab Results  Component Value Date   HGBA1C 10.0 (H) 10/17/2019   HGBA1C 10.5 (H) 07/22/2019   HGBA1C 9.7 (H) 04/15/2019  DM foot exam done today, and eye exam done recently as well - requesting records ACEI/ARB: Yes Statin: Yes Working on avoiding sugary drinks, pushing water, backed foods   HLD still taking ASA and crestor 40 mg daily, vascepa.  He denies chest pain, claudication, denies myalgias or any side effects or concerns from his  medication. Lab Results  Component Value Date   CHOL 253 (H) 10/17/2019   HDL 39 (L) 10/17/2019   LDLCALC 157 (H) 10/17/2019   TRIG 372 (H) 10/17/2019   CHOLHDL 6.5 (H) 10/17/2019   The 10-year ASCVD risk score Mikey Bussing DC Jr., et al., 2013) is: 33.3%   Values used to calculate the score:     Age: 59 years     Sex: Male     Is Non-Hispanic African American: Yes     Diabetic: Yes     Tobacco smoker: Yes     Systolic Blood Pressure: 696 mmHg     Is BP treated: Yes     HDL Cholesterol: 39 mg/dL     Total Cholesterol: 253  mg/dL   Hypertension:  Currently managed on lisinopril 1- mg daily Pt reports good med compliance and denies any SE.  No lightheadedness, hypotension, syncope. Blood pressure today is well controlled. BP Readings from Last 3 Encounters:  01/17/20 122/70  10/17/19 132/78  08/15/19 136/78  Pt denies CP, SOB, exertional sx, LE edema, palpitation, visual disturbances  - HA's as documented above   Patient Active Problem List   Diagnosis Date Noted  . Diabetes mellitus type 2 in obese ([place]) 11/12/2018  . 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 Obesity (BMI 30.0-34.9) 05/27/2018  . Edema of esophagus   . Angiodysplasia of stomach and duodenum   . Stomach irritation   . Heartburn   . Smoker 05/11/2018  . Hypertension 09/15/2017  . Peripheral vascular insufficiency ([place]) 07/06/2017  . Hyperlipidemia associated with type 2 diabetes mellitus ([place]) 10/31/2016    Past Surgical History:  Procedure Laterality Date  . ESOPHAGOGASTRODUODENOSCOPY (EGD) WITH PROPOFOL N/A 05/26/2018   Procedure: ESOPHAGOGASTRODUODENOSCOPY (EGD) WITH PROPOFOL;  Surgeon: Virgel Manifold, MD;  Location: [facility] ENDOSCOPY;  Service: Endoscopy;  Laterality: N/A;  . KNEE SURGERY Right     Family History  Problem Relation Age of Onset  . Hypertension Father   . Diabetes Father   . Liver disease Father   . Diverticulitis Mother   . Migraines Mother   . Cancer Sister        tumors in the breast   . Diabetes Maternal Grandfather   . Cancer Sister        tumors in the breast   . Cancer Sister        tumor in the breast     Social History   Tobacco Use  . Smoking status: Current Every Day Smoker    Packs/day: 0.50    Years: 30.00    Pack years: 15.00    Types: Cigarettes  . Smokeless tobacco: Never Used  Substance Use Topics  . Alcohol use: Yes    Alcohol/week: 0.0 standard drinks    Comment: none in the last 3 weeks  . Drug use: No      Current Outpatient Medications:  .  aspirin EC 81 MG EC  tablet, Take 1 tablet (81 mg total) by mouth daily., Disp: , Rfl:  .  BD PEN NEEDLE MICRO U/F 32G X 6 MM MISC, USE FOR LEVEMIR INJECTION DAILY, Disp: 100 each, Rfl: 0 .  Blood Glucose Monitoring Suppl (D-CARE GLUCOMETER) w/Device KIT, 1 Units by Does not apply route 4 (four) times daily -  before meals and at bedtime., Disp: 1 kit, Rfl: 0 .  Dulaglutide (TRULICITY) 7.89 FY/1.0FB SOPN, Inject 0.75 mg into the skin once a week., Disp: 3 pen, Rfl: 3 .  empagliflozin (JARDIANCE) 25 MG  TABS tablet, Take 25 mg by mouth daily before breakfast., Disp: 90 tablet, Rfl: 1 .  Insulin Detemir (LEVEMIR FLEXTOUCH) 100 UNIT/ML Pen, Inject 32-60 Units into the skin daily., Disp: 15 mL, Rfl:  .  lisinopril (ZESTRIL) 10 MG tablet, Take 0.5 tablets (5 mg total) by mouth daily., Disp: 90 tablet, Rfl: 3 .  meloxicam (MOBIC) 7.5 MG tablet, Take 1 tablet (7.5 mg total) by mouth daily as needed for pain., Disp: 30 tablet, Rfl: 0 .  metFORMIN (GLUCOPHAGE) 1000 MG tablet, TAKE 1 TABLET BY MOUTH TWICE A DAY WITH A MEAL, Disp: 180 tablet, Rfl: 0 .  rosuvastatin (CRESTOR) 40 MG tablet, Take 1 tablet (40 mg total) by mouth daily., Disp: 90 tablet, Rfl: 3 .  topiramate (TOPAMAX) 25 MG tablet, Take 25 mg nightly for one week then increase to 25 mg two times a day (every 12 hours) for one week then increase to 25 mg in the morning and 50 mg at night for one week then increase to 50 mg two times a day (every 12 hours), Disp: , Rfl:  .  VASCEPA 1 g CAPS, TAKE 2 CAPSULES (2 G TOTAL) BY MOUTH 2 (TWO) TIMES A DAY., Disp: 360 capsule, Rfl: 0 .  Vitamin D, Ergocalciferol, (DRISDOL) 1.25 MG (50000 UT) CAPS capsule, Take 50,000 Units by mouth once a week., Disp: , Rfl:   Allergies  Allergen Reactions  . No Known Allergies     Chart Review Today: I personally reviewed active problem list, medication list, allergies, family history, social history, health maintenance, notes from last encounter, lab results, imaging with the  patient/caregiver today.   Review of Systems  Constitutional: Negative.   HENT: Negative.   Eyes: Negative.   Respiratory: Negative.   Cardiovascular: Negative.   Gastrointestinal: Negative.   Endocrine: Negative.   Genitourinary: Negative.   Musculoskeletal: Negative.   Skin: Negative.   Allergic/Immunologic: Negative.   Neurological: Negative.   Hematological: Negative.   Psychiatric/Behavioral: Negative.   All other systems reviewed and are negative.    Objective:    There were no vitals filed for this visit.  There is no height or weight on file to calculate BMI.  Physical Exam Vitals and nursing note reviewed.  Constitutional:      General: He is not in acute distress.    Appearance: Normal appearance. He is well-developed. He is obese. He is not ill-appearing, toxic-appearing or diaphoretic.     Interventions: Face mask in place.     Comments: Appears older than stated age  HENT:     Head: Normocephalic and atraumatic.     Jaw: No trismus.     Right Ear: External ear normal.     Left Ear: External ear normal.  Eyes:     General: Lids are normal. No scleral icterus.    Conjunctiva/sclera: Conjunctivae normal.     Pupils: Pupils are equal, round, and reactive to light.  Neck:     Trachea: Trachea and phonation normal. No tracheal deviation.  Cardiovascular:     Rate and Rhythm: Normal rate and regular rhythm.     Pulses: Normal pulses.          Radial pulses are 2+ on the right side and 2+ on the left side.       Posterior tibial pulses are 2+ on the right side and 2+ on the left side.     Heart sounds: Normal heart sounds. No murmur. No friction rub. No gallop.   Pulmonary:  Effort: Pulmonary effort is normal. No respiratory distress.     Breath sounds: Normal breath sounds. No stridor. No wheezing, rhonchi or rales.  Abdominal:     General: Bowel sounds are normal. There is no distension.     Palpations: Abdomen is soft.     Tenderness: There is no  abdominal tenderness. There is no guarding or rebound.  Musculoskeletal:        General: Normal range of motion.     Cervical back: Normal range of motion and neck supple.     Right lower leg: No edema.     Left lower leg: No edema.  Skin:    General: Skin is warm and dry.     Capillary Refill: Capillary refill takes less than 2 seconds.     Coloration: Skin is not jaundiced or pale.     Findings: No rash.     Nails: There is no clubbing.  Neurological:     Mental Status: He is alert.     Cranial Nerves: No dysarthria or facial asymmetry.     Motor: No tremor or abnormal muscle tone.     Gait: Gait normal.  Psychiatric:        Mood and Affect: Mood normal.        Speech: Speech normal.        Behavior: Behavior normal. Behavior is cooperative.       Diabetic Foot Exam: Diabetic Foot Exam - Simple   Simple Foot Form Diabetic Foot exam was performed with the following findings: Yes 01/17/2020  9:45 AM  Visual Inspection Sensation Testing Pulse Check Comments     Fall Risk: Fall Risk  10/17/2019 08/15/2019 07/22/2019 05/31/2019 05/19/2019  Falls in the past year? 0 0 0 0 1  Number falls in past yr: 0 0 0 0 1  Injury with Fall? 0 0 0 0 0  Follow up Falls evaluation completed Falls evaluation completed Falls evaluation completed Falls evaluation completed Falls evaluation completed    Functional Status Survey: Is the patient deaf or have difficulty hearing?: No Does the patient have difficulty seeing, even when wearing glasses/contacts?: Yes(blurred vision from headaches) Does the patient have difficulty concentrating, remembering, or making decisions?: No Does the patient have difficulty walking or climbing stairs?: No Does the patient have difficulty dressing or bathing?: No Does the patient have difficulty doing errands alone such as visiting a doctor's office or shopping?: No   Assessment & Plan:     ICD-10-CM   1. Insulin dependent type 2 diabetes mellitus,  uncontrolled (HCC)  E11.65 Microalbumin, urine   Z79.4 Hemoglobin A1c    Lipid panel    COMPLETE METABOLIC PANEL WITH GFR    CBC with Differential/Platelet    Ambulatory referral to Chronic Care Management Services   Ran out of multiple medications chronic care management referral, will need to restart insulin, Trulicity if we can get  2. Hyperlipidemia associated with type 2 diabetes mellitus (HCC)  E11.69 Lipid panel   N17.0 COMPLETE METABOLIC PANEL WITH GFR   Very high risk, Crestor was increased from 20 to 40 mg we will recheck labs, unclear compliance with medication  3. Essential hypertension  Y17 COMPLETE METABOLIC PANEL WITH GFR   Currently well controlled on lisinopril 10 mg  4. Smoker  F17.200    Encourage smoking cessation to decrease risk of heart attack or stroke  5. New daily persistent headache  G44.52    Headache evaluation per neurology, patient has not been able  to work, lost his job, trying to get disability paperwork completed brings in long-term disability  6. Encounter for medication monitoring  Z51.81 Microalbumin, urine    Hemoglobin A1c    Lipid panel    COMPLETE METABOLIC PANEL WITH GFR    CBC with Differential/Platelet    Multiple uncontrolled issues, patient new to me, insulin-dependent diabetic ran out of most of his medications, his insurance has changed, he has several concerns and paperwork with him today.  Extensive amount of time reviewing chart today, reviewing neurology work-up, documentation between multiple specialists, completing paperwork today.   Return for 2 week DM f/up in office - bring meter.   Delsa Grana, PA-C 01/17/20 10:04 AM

## 2020-01-18 ENCOUNTER — Ambulatory Visit: Payer: Medicaid Other | Admitting: Pharmacist

## 2020-01-18 DIAGNOSIS — IMO0002 Reserved for concepts with insufficient information to code with codable children: Secondary | ICD-10-CM

## 2020-01-18 DIAGNOSIS — E1165 Type 2 diabetes mellitus with hyperglycemia: Secondary | ICD-10-CM

## 2020-01-19 ENCOUNTER — Other Ambulatory Visit: Payer: Self-pay | Admitting: Family Medicine

## 2020-01-20 LAB — MICROALBUMIN, URINE: Microalb, Ur: 1.3 mg/dL

## 2020-01-20 LAB — HEMOGLOBIN A1C
Hgb A1c MFr Bld: 11.4 % of total Hgb — ABNORMAL HIGH (ref <5.7)
Mean Plasma Glucose: 280 (calc)
eAG (mmol/L): 15.5 (calc)

## 2020-01-20 LAB — COMPLETE METABOLIC PANEL WITH GFR
AG Ratio: 1.4 (calc) (ref 1.0–2.5)
ALT: 17 U/L (ref 9–46)
AST: 11 U/L (ref 10–35)
Albumin: 4.5 g/dL (ref 3.6–5.1)
Alkaline phosphatase (APISO): 81 U/L (ref 35–144)
BUN: 16 mg/dL (ref 7–25)
CO2: 29 mmol/L (ref 20–32)
Calcium: 10.5 mg/dL — ABNORMAL HIGH (ref 8.6–10.3)
Chloride: 103 mmol/L (ref 98–110)
Creat: 0.95 mg/dL (ref 0.70–1.33)
GFR, Est African American: 104 mL/min/{1.73_m2} (ref >=60)
GFR, Est Non African American: 90 mL/min/{1.73_m2} (ref >=60)
Globulin: 3.3 g/dL (calc) (ref 1.9–3.7)
Glucose, Bld: 168 mg/dL — ABNORMAL HIGH (ref 65–99)
Potassium: 4.9 mmol/L (ref 3.5–5.3)
Sodium: 141 mmol/L (ref 135–146)
Total Bilirubin: 0.3 mg/dL (ref 0.2–1.2)
Total Protein: 7.8 g/dL (ref 6.1–8.1)

## 2020-01-20 LAB — VITAMIN D 25 HYDROXY (VIT D DEFICIENCY, FRACTURES): Vit D, 25-Hydroxy: 14 ng/mL — ABNORMAL LOW (ref 30–100)

## 2020-01-20 LAB — CBC WITH DIFFERENTIAL/PLATELET
Absolute Monocytes: 811 cells/uL (ref 200–950)
Basophils Absolute: 83 cells/uL (ref 0–200)
Basophils Relative: 0.8 %
Eosinophils Absolute: 156 cells/uL (ref 15–500)
Eosinophils Relative: 1.5 %
HCT: 46.3 % (ref 38.5–50.0)
Hemoglobin: 15.5 g/dL (ref 13.2–17.1)
Lymphs Abs: 3151 cells/uL (ref 850–3900)
MCH: 30.7 pg (ref 27.0–33.0)
MCHC: 33.5 g/dL (ref 32.0–36.0)
MCV: 91.7 fL (ref 80.0–100.0)
MPV: 11.8 fL (ref 7.5–12.5)
Monocytes Relative: 7.8 %
Neutro Abs: 6198 cells/uL (ref 1500–7800)
Neutrophils Relative %: 59.6 %
Platelets: 289 10*3/uL (ref 140–400)
RBC: 5.05 10*6/uL (ref 4.20–5.80)
RDW: 13.2 % (ref 11.0–15.0)
Total Lymphocyte: 30.3 %
WBC: 10.4 10*3/uL (ref 3.8–10.8)

## 2020-01-20 LAB — TEST AUTHORIZATION

## 2020-01-20 LAB — LIPID PANEL
Cholesterol: 187 mg/dL (ref <200)
HDL: 36 mg/dL — ABNORMAL LOW (ref >=40)
LDL Cholesterol (Calc): 121 mg/dL (calc) — ABNORMAL HIGH
Non-HDL Cholesterol (Calc): 151 mg/dL (calc) — ABNORMAL HIGH (ref <130)
Total CHOL/HDL Ratio: 5.2 (calc) — ABNORMAL HIGH (ref <5.0)
Triglycerides: 179 mg/dL — ABNORMAL HIGH (ref <150)

## 2020-01-24 ENCOUNTER — Other Ambulatory Visit: Payer: Self-pay | Admitting: Family Medicine

## 2020-01-24 MED ORDER — VITAMIN D (ERGOCALCIFEROL) 1.25 MG (50000 UNIT) PO CAPS: 50000.0000 [IU] | ORAL_CAPSULE | ORAL | 0 refills | Status: DC

## 2020-01-24 NOTE — Progress Notes (Unsigned)
Pts requested disability forms have been completed to the best of my ability. Pt is new to me, PCP is out of office on leave She first saw and evaluated pt for HA's last year May - June 2020 and he was sent to neuro for further evaluation.   Intermittent visit with Irving Burton between June and Nov last year, throughout that time pt continuing to see Neuro, due for EEG which he did not want to do, PT - not sure if he did, sleep study to r/o OSA causing morning HA's - not done- and with occipital HA's neuro offered procedures/injections to treat which I do not believe has been done either.  I addressed pt's other chronic conditions with his more recent visit, with several conditions unstable, uncontrolled and out of meds with new insurance coverage. I did not see him for nor did I do any assessment of his complaint of headache - I briefly reviewed his more recent visits and staff elsewhere had his requested paperwork.  I have completed to the best of my ability, most of the evaluation needs the specialists or PT to complete and stipulate any limitation (if any) on his functional status and job duties.   I currently do not know of any limitations or restrictions that I would medically recommend.  Additional forms were received by our office over the last week, expanding the time from of records requested.  Was from Jan 2021 to presents - however no visits and no future visits, changed request to Nov 2020 to presents and records have already been faxed.     Per PCP's last note, this is all I can find in our EMR: He has been on short term then long term disability for this issue, and I did advise him on 08/03/2019, that from my perspective as PCP, I cannot provide additional supporting evidence for keeping him out of work as he has had a very thorough evaluation from several specialists.  At this point, any out of work paperwork will need to be completed by neurology as they are continuing to work on his  headaches/lightheadedness.    I have looked through care everywhere for [facility] documentation or notes regarding disability and cannot find anything but some telephone notes from 2/15 where the PA stated forms were completed.  No recent visit, no scheduled visits, and I cannot see any media that shows disability paperwork or any recent OV A&P.  Danelle Berry, PA-C 01/24/20 9:15 PM

## 2020-01-25 NOTE — Patient Instructions (Signed)
Goals Addressed            This Visit's Progress   . Medication assistance (pt-stated)       Current Barriers:  . financial  Pharmacist Clinical Goal(s): Over the next 14 days, Donald [name] will provide the necessary supplementary documents (proof of out of pocket prescription expenditure, proof of household income) needed for medication assistance applications to CCM pharmacist.   Interventions: . CCM pharmacist will apply for medication assistance program for Trulicity made by [place] and Jardiance made by [employer], both prescribed by Maurice Small  Patient Self Care Activities:  Marland Kitchen Gather necessary documents needed to apply for medication assistance  Initial goal documentation

## 2020-01-25 NOTE — Chronic Care Management (AMB) (Signed)
  Care Management   Pharmacy Note   01/25/2020-late entry Name: Donald [name] MRN: [id number] DOB: 11/30/1966  Subjective Donald [name] is a 54 y.o. year old male who is a primary care patient of Hubbard Hartshorn, FNP. The CCM team was consulted for assistance with chronic disease management and care coordination needs.    Review of patient status, including review of consultants reports, relevant laboratory and other test results, and collaboration with appropriate care team members and the patient's provider was performed as part of comprehensive patient evaluation and provision of chronic care management services.    Objective  Outpatient Encounter Medications as of 01/18/2020  Medication Sig Note  . aspirin EC 81 MG EC tablet Take 1 tablet (81 mg total) by mouth daily.   . BD PEN NEEDLE MICRO U/F 32G X 6 MM MISC USE FOR LEVEMIR INJECTION DAILY   . Blood Glucose Monitoring Suppl (D-CARE GLUCOMETER) w/Device KIT 1 Units by Does not apply route 4 (four) times daily -  before meals and at bedtime.   . Dulaglutide (TRULICITY) 5.46 FK/8.1EX SOPN Inject 0.75 mg into the skin once a week. 01/18/2020: assistance  . empagliflozin (JARDIANCE) 25 MG TABS tablet Take 25 mg by mouth daily before breakfast. 01/18/2020: Needs assistance  . Insulin Detemir (LEVEMIR FLEXTOUCH) 100 UNIT/ML Pen Inject 32-60 Units into the skin daily. 01/18/2020: Needs assistance  . lisinopril (ZESTRIL) 10 MG tablet Take 0.5 tablets (5 mg total) by mouth daily. 01/18/2020: Patient taking '10mg'$   . metFORMIN (GLUCOPHAGE) 1000 MG tablet TAKE 1 TABLET BY MOUTH TWICE A DAY WITH A MEAL   . rosuvastatin (CRESTOR) 40 MG tablet Take 1 tablet (40 mg total) by mouth daily.   Marland Kitchen topiramate (TOPAMAX) 50 MG tablet 2 (two) times daily.    Marland Kitchen VASCEPA 1 g CAPS TAKE 2 CAPSULES (2 G TOTAL) BY MOUTH 2 (TWO) TIMES A DAY.   . meloxicam (MOBIC) 7.5 MG tablet Take 1 tablet (7.5 mg total) by mouth daily as needed for pain. (Patient not taking: Reported on  01/18/2020)   . [DISCONTINUED] Vitamin D, Ergocalciferol, (DRISDOL) 1.25 MG (50000 UT) CAPS capsule Take 50,000 Units by mouth once a week.    No facility-administered encounter medications on file as of 01/18/2020.     Assessment:   Goals Addressed            This Visit's Progress   . Medication assistance (pt-stated)       Current Barriers:  . financial  Pharmacist Clinical Goal(s): Over the next 14 days, Donald [name] will provide the necessary supplementary documents (proof of out of pocket prescription expenditure, proof of household income) needed for medication assistance applications to CCM pharmacist.   Interventions: . CCM pharmacist will apply for medication assistance program for Trulicity made by [place] and Jardiance made by [employer], both prescribed by Donald [name]  Patient Self Care Activities:  Marland Kitchen Gather necessary documents needed to apply for medication assistance  Initial goal documentation         Plan:   Telephone follow up appointment with care management team member scheduled for:2 weeks with PharmD   Ruben Reason, PharmD Clinical Pharmacist [facility] [phone]

## 2020-02-01 ENCOUNTER — Other Ambulatory Visit: Admission: RE | Admit: 2020-02-01 | Payer: Self-pay | Source: Ambulatory Visit

## 2020-02-10 ENCOUNTER — Encounter: Payer: Self-pay | Admitting: Family Medicine

## 2020-02-10 ENCOUNTER — Ambulatory Visit (INDEPENDENT_AMBULATORY_CARE_PROVIDER_SITE_OTHER): Payer: 59 | Admitting: Family Medicine

## 2020-02-10 ENCOUNTER — Other Ambulatory Visit: Payer: Self-pay

## 2020-02-10 VITALS — BP 118/72 | HR 105 | Temp 98.1°F | Resp 14 | Ht 72.0 in | Wt 217.6 lb

## 2020-02-10 DIAGNOSIS — E1165 Type 2 diabetes mellitus with hyperglycemia: Secondary | ICD-10-CM | POA: Diagnosis not present

## 2020-02-10 DIAGNOSIS — I10 Essential (primary) hypertension: Secondary | ICD-10-CM | POA: Diagnosis not present

## 2020-02-10 DIAGNOSIS — IMO0002 Reserved for concepts with insufficient information to code with codable children: Secondary | ICD-10-CM

## 2020-02-10 DIAGNOSIS — E1151 Type 2 diabetes mellitus with diabetic peripheral angiopathy without gangrene: Secondary | ICD-10-CM

## 2020-02-10 DIAGNOSIS — Z794 Long term (current) use of insulin: Secondary | ICD-10-CM

## 2020-02-10 DIAGNOSIS — R519 Headache, unspecified: Secondary | ICD-10-CM | POA: Insufficient documentation

## 2020-02-10 MED ORDER — LEVEMIR FLEXTOUCH 100 UNIT/ML ~~LOC~~ SOPN: 10.0000 [IU] | PEN_INJECTOR | Freq: Every day | SUBCUTANEOUS | 3 refills | Status: DC

## 2020-02-10 MED ORDER — LISINOPRIL 10 MG PO TABS: 10.0000 mg | ORAL_TABLET | Freq: Every day | ORAL | 3 refills | Status: DC

## 2020-02-10 MED ORDER — TRULICITY 1.5 MG/0.5ML ~~LOC~~ SOAJ: 1.5000 mg | SUBCUTANEOUS | 3 refills | Status: DC

## 2020-02-10 MED ORDER — METFORMIN HCL 1000 MG PO TABS: 1000.0000 mg | ORAL_TABLET | Freq: Two times a day (BID) | ORAL | 3 refills | Status: DC

## 2020-02-10 MED ORDER — EMPAGLIFLOZIN 25 MG PO TABS: 25.0000 mg | ORAL_TABLET | Freq: Every day | ORAL | 3 refills | Status: DC

## 2020-02-10 NOTE — Progress Notes (Signed)
Patient ID: Donald [name], male    DOB: 1966/03/13, 54 y.o.   MRN: [id number]  PCP: Hubbard Hartshorn, FNP  Chief Complaint  Patient presents with  . Follow-up  . Diabetes    BG am 134    Subjective:   Donald [name] is a 54 y.o. male, presents to clinic with CC of the following:  to f/up on uncontrolled DM  Last visit was 01/17/2020 - pt new to me and PCP out of the office on leave:  Diabetes Mellitus Type II: Currently managing with trulicity, levemir, metformin, and Levemir levemir ran out due to cost/insurance, levemir was 20 units trulicity - never got, not taking Metformin 1000 mg BID Jardiance almost out, but is taking 25 mg daily Pt has no SE from meds. Fasting CBGs typically run 200's  recent high CBG 430 and low CBG when he was on insulin 130-140.   No hypoglycemic episodes Denies: Polyuria, polydipsia, polyphagia, vision changes, or neuropathy some weight loss since last visit 3 months ago    Wt Readings from Last 5 Encounters:  01/17/20 217 lb 8 oz (98.7 kg)  10/17/19 224 lb 9.6 oz (101.9 kg)  08/15/19 216 lb 6.4 oz (98.2 kg)  07/22/19 223 lb 3.2 oz (101.2 kg)  05/31/19 226 lb 4.8 oz (102.6 kg)  Last several A1C Recent Labs       Lab Results  Component Value Date   HGBA1C 10.0 (H) 10/17/2019   HGBA1C 10.5 (H) 07/22/2019   HGBA1C 9.7 (H) 04/15/2019    DM foot exam done today, and eye exam done recently as well - requesting records ACEI/ARB: Yes Statin: Yes Working on avoiding sugary drinks, pushing water, backed foods  Pt was referred to pharmacist and CCM for assistance with meds, but he now has insurance and he states it should cover it. Last A1C about one month ago Lab Results  Component Value Date   HGBA1C 11.4 (H) 01/17/2020   Renal function recent labs:  Lab Results  Component Value Date   GFRAA 104 01/17/2020   GFRAA 94 10/17/2019   GFRAA 85 08/15/2019    Lab Results  Component Value Date   CREATININE 0.95 01/17/2020   BUN 16  01/17/2020   NA 141 01/17/2020   K 4.9 01/17/2020   CL 103 01/17/2020   CO2 29 01/17/2020    He has been taking long acting insulin 10 units daily at bedtime Trulicity 1.5 mg he increased the dose, giving on Sundays, he did not have any SE or symptoms with the dose increase from 0.75 to 1.50, another sample given today 2 single dose pens Metformin 1000 mg BID no GI SE, denies N, V, D Jardiance 25 mg daily He did bring his meter in:  Blood sugars 90-130's and only a few higher 140 - 150   Patient Active Problem List   Diagnosis Date Noted  . Chronic intractable headache 02/10/2020  . Diabetes mellitus type 2 in obese ([place]) 11/12/2018  . 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 Obesity (BMI 30.0-34.9) 05/27/2018  . Edema of esophagus   . Angiodysplasia of stomach and duodenum   . Stomach irritation   . Heartburn   . Smoker 05/11/2018  . Hypertension 09/15/2017  . Peripheral vascular insufficiency ([place]) 07/06/2017  . Hyperlipidemia associated with type 2 diabetes mellitus ([place]) 10/31/2016      Current Outpatient Medications:  .  aspirin EC 81 MG EC tablet, Take 1 tablet (81 mg total) by mouth daily.,  Disp: , Rfl:  .  empagliflozin (JARDIANCE) 25 MG TABS tablet, Take 25 mg by mouth daily before breakfast., Disp: 90 tablet, Rfl: 3 .  insulin detemir (LEVEMIR FLEXTOUCH) 100 UNIT/ML FlexPen, Inject 10-20 Units into the skin daily., Disp: 15 mL, Rfl: 3 .  lisinopril (ZESTRIL) 10 MG tablet, Take 1 tablet (10 mg total) by mouth daily., Disp: 90 tablet, Rfl: 3 .  metFORMIN (GLUCOPHAGE) 1000 MG tablet, Take 1 tablet (1,000 mg total) by mouth 2 (two) times daily with a meal., Disp: 180 tablet, Rfl: 3 .  rosuvastatin (CRESTOR) 40 MG tablet, Take 1 tablet (40 mg total) by mouth daily., Disp: 90 tablet, Rfl: 3 .  topiramate (TOPAMAX) 50 MG tablet, 2 (two) times daily. , Disp: , Rfl:  .  VASCEPA 1 g CAPS, TAKE 2 CAPSULES (2 G TOTAL) BY MOUTH 2 (TWO) TIMES A DAY., Disp: 360 capsule, Rfl: 0 .   Vitamin D, Ergocalciferol, (DRISDOL) 1.25 MG (50000 UNIT) CAPS capsule, Take 1 capsule (50,000 Units total) by mouth every 7 (seven) days. x12 weeks., Disp: 12 capsule, Rfl: 0 .  BD PEN NEEDLE MICRO U/F 32G X 6 MM MISC, USE FOR LEVEMIR INJECTION DAILY, Disp: 100 each, Rfl: 0 .  Blood Glucose Monitoring Suppl (D-CARE GLUCOMETER) w/Device KIT, 1 Units by Does not apply route 4 (four) times daily -  before meals and at bedtime., Disp: 1 kit, Rfl: 0 .  Dulaglutide (TRULICITY) 1.5 GX/2.1JH SOPN, Inject 1.5 mg into the skin once a week., Disp: 6 mL, Rfl: 3 .  meloxicam (MOBIC) 7.5 MG tablet, Take 1 tablet (7.5 mg total) by mouth daily as needed for pain. (Patient not taking: Reported on 02/10/2020), Disp: 30 tablet, Rfl: 0   Allergies  Allergen Reactions  . No Known Allergies      Family History  Problem Relation Age of Onset  . Hypertension Father   . Diabetes Father   . Liver disease Father   . Diverticulitis Mother   . Migraines Mother   . Cancer Sister        tumors in the breast   . Diabetes Maternal Grandfather   . Cancer Sister        tumors in the breast   . Cancer Sister        tumor in the breast      Social History   Socioeconomic History  . Marital status: Married    Spouse name: Not on file  . Number of children: Not on file  . Years of education: Not on file  . Highest education level: Not on file  Occupational History  . Not on file  Tobacco Use  . Smoking status: Current Every Day Smoker    Packs/day: 0.50    Years: 30.00    Pack years: 15.00    Types: Cigarettes  . Smokeless tobacco: Never Used  Substance and Sexual Activity  . Alcohol use: Yes    Alcohol/week: 0.0 standard drinks    Comment: none in the last 3 weeks  . Drug use: No  . Sexual activity: Yes    Partners: Female  Other Topics Concern  . Not on file  Social History Narrative  . Not on file   Social Determinants of Health   Financial Resource Strain:   . Difficulty of Paying Living  Expenses:   Food Insecurity:   . Worried About Charity fundraiser in the Last Year:   . [place] in the Last Year:  Transportation Needs:   . Film/video editor (Medical):   Marland Kitchen Lack of Transportation (Non-Medical):   Physical Activity:   . Days of Exercise per Week:   . Minutes of Exercise per Session:   Stress:   . Feeling of Stress :   Social Connections:   . Frequency of Communication with Friends and Family:   . Frequency of Social Gatherings with Friends and Family:   . Attends Religious Services:   . Active Member of Clubs or Organizations:   . Attends Archivist Meetings:   Marland Kitchen Marital Status:   Intimate Partner Violence:   . Fear of Current or Ex-Partner:   . Emotionally Abused:   Marland Kitchen Physically Abused:   . Sexually Abused:     Chart Review Today: I personally reviewed active problem list, medication list, allergies, family history, social history, health maintenance, notes from last encounter, lab results, imaging with the patient/caregiver today.   Review of Systems 10 Systems reviewed and are negative for acute change except as noted in the HPI.     Objective:   Vitals:   02/10/20 1505  BP: 118/72  Pulse: (!) 105  Resp: 14  Temp: 98.1 F (36.7 C)  SpO2: 98%  Weight: 217 lb 9.6 oz (98.7 kg)  Height: 6' (1.829 m)    Body mass index is 29.51 kg/m.  Physical Exam Vitals and nursing note reviewed.  Constitutional:      General: He is not in acute distress.    Appearance: Normal appearance. He is well-developed. He is not ill-appearing, toxic-appearing or diaphoretic.  HENT:     Head: Normocephalic and atraumatic.     Nose: Nose normal.  Eyes:     General:        Right eye: No discharge.        Left eye: No discharge.     Conjunctiva/sclera: Conjunctivae normal.  Neck:     Trachea: No tracheal deviation.  Cardiovascular:     Rate and Rhythm: Normal rate and regular rhythm.     Pulses: Normal pulses.     Heart sounds: Normal  heart sounds. No murmur. No friction rub. No gallop.   Pulmonary:     Effort: Pulmonary effort is normal. No respiratory distress.     Breath sounds: Normal breath sounds. No stridor. No wheezing or rhonchi.  Musculoskeletal:        General: Normal range of motion.     Right lower leg: No edema.     Left lower leg: No edema.  Skin:    General: Skin is warm and dry.     Findings: No rash.  Neurological:     Mental Status: He is alert.     Motor: No abnormal muscle tone.     Coordination: Coordination normal.  Psychiatric:        Behavior: Behavior normal.          Results for orders placed or performed in visit on 01/17/20  Microalbumin, urine  Result Value Ref Range   Microalb, Ur 1.3 mg/dL   RAM    Hemoglobin A1c  Result Value Ref Range   Hgb A1c MFr Bld 11.4 (H) <5.7 % of total Hgb   Mean Plasma Glucose 280 (calc)   eAG (mmol/L) 15.5 (calc)  Lipid panel  Result Value Ref Range   Cholesterol 187 <200 mg/dL   HDL 36 (L) > OR = 40 mg/dL   Triglycerides 179 (H) <150 mg/dL   LDL Cholesterol (Calc) 121 (  H) mg/dL (calc)   Total CHOL/HDL Ratio 5.2 (H) <5.0 (calc)   Non-HDL Cholesterol (Calc) 151 (H) <130 mg/dL (calc)  COMPLETE METABOLIC PANEL WITH GFR  Result Value Ref Range   Glucose, Bld 168 (H) 65 - 99 mg/dL   BUN 16 7 - 25 mg/dL   Creat 0.95 0.70 - 1.33 mg/dL   GFR, Est Non African American 90 > OR = 60 mL/min/1.79m   GFR, Est African American 104 > OR = 60 mL/min/1.765m  BUN/Creatinine Ratio NOT APPLICABLE 6 - 22 (calc)   Sodium 141 135 - 146 mmol/L   Potassium 4.9 3.5 - 5.3 mmol/L   Chloride 103 98 - 110 mmol/L   CO2 29 20 - 32 mmol/L   Calcium 10.5 (H) 8.6 - 10.3 mg/dL   Total Protein 7.8 6.1 - 8.1 g/dL   Albumin 4.5 3.6 - 5.1 g/dL   Globulin 3.3 1.9 - 3.7 g/dL (calc)   AG Ratio 1.4 1.0 - 2.5 (calc)   Total Bilirubin 0.3 0.2 - 1.2 mg/dL   Alkaline phosphatase (APISO) 81 35 - 144 U/L   AST 11 10 - 35 U/L   ALT 17 9 - 46 U/L  CBC with  Differential/Platelet  Result Value Ref Range   WBC 10.4 3.8 - 10.8 Thousand/uL   RBC 5.05 4.20 - 5.80 Million/uL   Hemoglobin 15.5 13.2 - 17.1 g/dL   HCT 46.3 38.5 - 50.0 %   MCV 91.7 80.0 - 100.0 fL   MCH 30.7 27.0 - 33.0 pg   MCHC 33.5 32.0 - 36.0 g/dL   RDW 13.2 11.0 - 15.0 %   Platelets 289 140 - 400 Thousand/uL   MPV 11.8 7.5 - 12.5 fL   Neutro Abs 6,198 1,500 - 7,800 cells/uL   Lymphs Abs 3,151 850 - 3,900 cells/uL   Absolute Monocytes 811 200 - 950 cells/uL   Eosinophils Absolute 156 15 - 500 cells/uL   Basophils Absolute 83 0 - 200 cells/uL   Neutrophils Relative % 59.6 %   Total Lymphocyte 30.3 %   Monocytes Relative 7.8 %   Eosinophils Relative 1.5 %   Basophils Relative 0.8 %  VITAMIN D 25 Hydroxy (Vit-D Deficiency, Fractures)  Result Value Ref Range   Vit D, 25-Hydroxy 14 (L) 30 - 100 ng/mL  TEST AUTHORIZATION  Result Value Ref Range   TEST NAME: VITAMIN D,25-OH,TOTAL,IA    TEST CODE: [id number]    CLIENT CONTACT: TALITHA TATUM    REPORT ALWAYS MESSAGE SIGNATURE          Assessment & Plan:      ICD-10-CM   1. Insulin dependent type 2 diabetes mellitus, uncontrolled (HCC)  E11.65 metFORMIN (GLUCOPHAGE) 1000 MG tablet   Z79.4 Dulaglutide (TRULICITY) 1.5 MGUD/1.4HFOPN    empagliflozin (JARDIANCE) 25 MG TABS tablet    insulin detemir (LEVEMIR FLEXTOUCH) 100 UNIT/ML FlexPen   pt compliant with meds, tolerated trulicity, sample given and meds sent to pharmacy, CBG meter shows DM likely controlled most readings 100-130's  2. DM (diabetes mellitus), type 2 with peripheral vascular complications (HCC)  E1W26.37etFORMIN (GLUCOPHAGE) 1000 MG tablet    Dulaglutide (TRULICITY) 1.5 MGCH/8.8FOOPN    empagliflozin (JARDIANCE) 25 MG TABS tablet    insulin detemir (LEVEMIR FLEXTOUCH) 100 UNIT/ML FlexPen    DISCONTINUED: lisinopril (ZESTRIL) 10 MG tablet    DISCONTINUED: insulin detemir (LEVEMIR FLEXTOUCH) 100 UNIT/ML FlexPen  3. Essential hypertension  I10 lisinopril  (ZESTRIL) 10 MG tablet    DISCONTINUED: lisinopril (ZESTRIL)  10 MG tablet     IDDM improving, pt compliant with no concerns or SE All meds refilled Goal fasting CBG 80 to <150 F/up in 2 months for routine visit and f/up lab work   Delsa Grana, PA-C 02/10/20 8:19 PM

## 2020-02-15 ENCOUNTER — Other Ambulatory Visit
Admission: RE | Admit: 2020-02-15 | Discharge: 2020-02-15 | Disposition: A | Discharge status: Home / Self Care | Payer: 59 | Source: Ambulatory Visit | Attending: Neurology | Admitting: Neurology

## 2020-02-15 ENCOUNTER — Other Ambulatory Visit: Payer: Self-pay

## 2020-02-15 DIAGNOSIS — G4761 Periodic limb movement disorder: Secondary | ICD-10-CM | POA: Insufficient documentation

## 2020-02-15 DIAGNOSIS — Z20822 Contact with and (suspected) exposure to covid-19: Secondary | ICD-10-CM | POA: Diagnosis not present

## 2020-02-15 DIAGNOSIS — G4733 Obstructive sleep apnea (adult) (pediatric): Secondary | ICD-10-CM | POA: Insufficient documentation

## 2020-02-15 DIAGNOSIS — Z01812 Encounter for preprocedural laboratory examination: Secondary | ICD-10-CM | POA: Insufficient documentation

## 2020-02-15 LAB — SARS CORONAVIRUS 2 (TAT 6-24 HRS): SARS Coronavirus 2: NEGATIVE

## 2020-02-17 ENCOUNTER — Ambulatory Visit: Payer: 59

## 2020-02-17 DIAGNOSIS — Z01812 Encounter for preprocedural laboratory examination: Secondary | ICD-10-CM | POA: Diagnosis not present

## 2020-02-20 ENCOUNTER — Other Ambulatory Visit: Payer: Self-pay

## 2020-03-10 ENCOUNTER — Ambulatory Visit: Payer: 59 | Attending: Internal Medicine

## 2020-03-10 DIAGNOSIS — Z23 Encounter for immunization: Secondary | ICD-10-CM

## 2020-03-10 NOTE — Progress Notes (Signed)
   Covid-19 Vaccination Clinic  Name:  Donald [name]    MRN: [id number] DOB: 12-Jul-1966  03/10/2020  [name] was observed post Covid-19 immunization for 15 minutes without incident. He was provided with Vaccine Information Sheet and instruction to access the V-Safe system.   [name] was instructed to call 911 with any severe reactions post vaccine: Marland Kitchen Difficulty breathing  . Swelling of face and throat  . A fast heartbeat  . A bad rash all over body  . Dizziness and weakness   Immunizations Administered    Name Date Dose VIS Date Route   Pfizer COVID-19 Vaccine 03/10/2020 10:42 AM 0.3 mL 11/11/2019 Intramuscular   Manufacturer: [employer], [employer]   Lot: [id number]   NDC: [address]-8

## 2020-04-04 ENCOUNTER — Ambulatory Visit: Payer: 59 | Attending: Internal Medicine

## 2020-04-04 DIAGNOSIS — Z23 Encounter for immunization: Secondary | ICD-10-CM

## 2020-04-04 NOTE — Progress Notes (Signed)
   Covid-19 Vaccination Clinic  Name:  Donald [name]    MRN: [id number] DOB: Feb 22, 1966  04/04/2020  Mr. Munter was observed post Covid-19 immunization for 15 minutes without incident. He was provided with Vaccine Information Sheet and instruction to access the V-Safe system.   Mr. Quintela was instructed to call 911 with any severe reactions post vaccine: Marland Kitchen Difficulty breathing  . Swelling of face and throat  . A fast heartbeat  . A bad rash all over body  . Dizziness and weakness   Immunizations Administered    Name Date Dose VIS Date Route   Pfizer COVID-19 Vaccine 04/04/2020 10:34 AM 0.3 mL 01/25/2019 Intramuscular   Manufacturer: [employer], [employer]   Lot: [id number]   NDC: [address]-8

## 2020-04-11 ENCOUNTER — Ambulatory Visit (INDEPENDENT_AMBULATORY_CARE_PROVIDER_SITE_OTHER): Payer: 59 | Admitting: Family Medicine

## 2020-04-11 ENCOUNTER — Other Ambulatory Visit: Payer: Self-pay

## 2020-04-11 ENCOUNTER — Encounter: Payer: Self-pay | Admitting: Family Medicine

## 2020-04-11 VITALS — BP 116/72 | HR 98 | Temp 98.1°F | Resp 14 | Ht 72.0 in | Wt 221.8 lb

## 2020-04-11 DIAGNOSIS — E1165 Type 2 diabetes mellitus with hyperglycemia: Secondary | ICD-10-CM

## 2020-04-11 DIAGNOSIS — F17200 Nicotine dependence, unspecified, uncomplicated: Secondary | ICD-10-CM

## 2020-04-11 DIAGNOSIS — G4733 Obstructive sleep apnea (adult) (pediatric): Secondary | ICD-10-CM

## 2020-04-11 DIAGNOSIS — H4710 Unspecified papilledema: Secondary | ICD-10-CM

## 2020-04-11 DIAGNOSIS — E669 Obesity, unspecified: Secondary | ICD-10-CM

## 2020-04-11 DIAGNOSIS — E785 Hyperlipidemia, unspecified: Secondary | ICD-10-CM

## 2020-04-11 DIAGNOSIS — IMO0002 Reserved for concepts with insufficient information to code with codable children: Secondary | ICD-10-CM

## 2020-04-11 DIAGNOSIS — J310 Chronic rhinitis: Secondary | ICD-10-CM

## 2020-04-11 DIAGNOSIS — I739 Peripheral vascular disease, unspecified: Secondary | ICD-10-CM | POA: Diagnosis not present

## 2020-04-11 DIAGNOSIS — Z5181 Encounter for therapeutic drug level monitoring: Secondary | ICD-10-CM

## 2020-04-11 DIAGNOSIS — I10 Essential (primary) hypertension: Secondary | ICD-10-CM

## 2020-04-11 DIAGNOSIS — E1169 Type 2 diabetes mellitus with other specified complication: Secondary | ICD-10-CM | POA: Diagnosis not present

## 2020-04-11 DIAGNOSIS — G4452 New daily persistent headache (NDPH): Secondary | ICD-10-CM

## 2020-04-11 DIAGNOSIS — E119 Type 2 diabetes mellitus without complications: Secondary | ICD-10-CM

## 2020-04-11 DIAGNOSIS — Z9989 Dependence on other enabling machines and devices: Secondary | ICD-10-CM

## 2020-04-11 DIAGNOSIS — Z794 Long term (current) use of insulin: Secondary | ICD-10-CM

## 2020-04-11 MED ORDER — LEVOCETIRIZINE DIHYDROCHLORIDE 5 MG PO TABS: 5.0000 mg | ORAL_TABLET | Freq: Every evening | ORAL | 2 refills | Status: DC

## 2020-04-11 MED ORDER — FLUTICASONE PROPIONATE 50 MCG/ACT NA SUSP: 2.0000 | Freq: Every day | NASAL | 6 refills | Status: DC

## 2020-04-11 MED ORDER — ROSUVASTATIN CALCIUM 40 MG PO TABS: 40.0000 mg | ORAL_TABLET | Freq: Every day | ORAL | 3 refills | Status: DC

## 2020-04-11 NOTE — Progress Notes (Signed)
Name: Donald [name]   MRN: [id number]    DOB: 1966/10/17   Date:04/11/2020       Progress Note  Chief Complaint  Patient presents with  . Follow-up  . Diabetes  . Hypertension  . Hyperlipidemia     Subjective:   Donald [name] is a 54 y.o. male, presents to clinic for routine follow up on the conditions listed above.  DM-  DM very uncontrolled with last OV when I was first seeing pt due to his PCP being out of office for maternity leave He was out of many of his meds, though we did prescribe and verify with pharmacy that he had them and they were covered Currently on Metformin 1000 mg BID, levemir 10 units daily at night, and trulicity 1.5 mg dose once a week, Jardiance 25 mg daily CBGs checking 3-4 x a day, sugars average 120's, 100- 190's, higher sugars in the morning depending on what he eats.  Lab Results  Component Value Date   HGBA1C 11.4 (H) 01/17/2020  He is eating less food than he used to, smaller portions and avoids breads, fried foods, salt Wt Readings from Last 5 Encounters:  04/11/20 221 lb 12.8 oz (100.6 kg)  02/10/20 217 lb 9.6 oz (98.7 kg)  01/17/20 217 lb 8 oz (98.7 kg)  10/17/19 224 lb 9.6 oz (101.9 kg)  08/15/19 216 lb 6.4 oz (98.2 kg)   BMI Readings from Last 5 Encounters:  04/11/20 30.08 kg/m  02/10/20 29.51 kg/m  01/17/20 29.91 kg/m  10/17/19 32.23 kg/m  08/15/19 31.05 kg/m   Hypertension:  Currently managed on lisinopril 10 mg daily Pt reports good med compliance and denies any SE.   No lightheadedness, hypotension, syncope. Blood pressure today is well controlled BP Readings from Last 3 Encounters:  04/11/20 116/72  02/10/20 118/72  01/17/20 122/70   Pt denies CP, SOB, exertional sx, LE edema, palpitation, Ha's, visual disturbances  Hyperlipidemia: Current Medication Regimen:  crestor - he was not able to get his refill  Last Lipids: Lab Results  Component Value Date   CHOL 187 01/17/2020   HDL 36 (L) 01/17/2020   LDLCALC 121  (H) 01/17/2020   TRIG 179 (H) 01/17/2020   CHOLHDL 5.2 (H) 01/17/2020   - Current Diet:  Trying to eat healthy - Denies: Chest pain, shortness of breath, myalgias. - Risk factors for atherosclerosis: diabetes mellitus, hypercholesterolemia and hypertension    Still seeing neurology for daily HA's- he did complete sleep study and was dx with OSA and is compliant with CPAP Still having HA's getting dizzy, for example yesterday he tried to weed eat the yard and he will get dizzy - Neuro is planning on doing a tap to dx possible IIH, he had eye exam previously (August) when eye Dr. suspected [facility] last year noted papilledema and suggested he f/up with neuro for eval of IIH - records printed and made sure today that neuro has [facility] records    Patient Active Problem List   Diagnosis Date Noted  . Chronic intractable headache 02/10/2020  . Diabetes mellitus type 2 in obese ([place]) 11/12/2018  . 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 Obesity (BMI 30.0-34.9) 05/27/2018  . Edema of esophagus   . Angiodysplasia of stomach and duodenum   . Stomach irritation   . Heartburn   . Smoker 05/11/2018  . Hypertension 09/15/2017  . Peripheral vascular insufficiency ([place]) 07/06/2017  . Hyperlipidemia associated with type 2 diabetes mellitus ([place])  10/31/2016    Past Surgical History:  Procedure Laterality Date  . ESOPHAGOGASTRODUODENOSCOPY (EGD) WITH PROPOFOL N/A 05/26/2018   Procedure: ESOPHAGOGASTRODUODENOSCOPY (EGD) WITH PROPOFOL;  Surgeon: Virgel Manifold, MD;  Location: [facility] ENDOSCOPY;  Service: Endoscopy;  Laterality: N/A;  . KNEE SURGERY Right     Family History  Problem Relation Age of Onset  . Hypertension Father   . Diabetes Father   . Liver disease Father   . Diverticulitis Mother   . Migraines Mother   . Cancer Sister        tumors in the breast   . Diabetes Maternal Grandfather   . Cancer Sister        tumors in the breast   . Cancer Sister         tumor in the breast     Social History   Tobacco Use  . Smoking status: Current Every Day Smoker    Packs/day: 0.50    Years: 30.00    Pack years: 15.00    Types: Cigarettes  . Smokeless tobacco: Never Used  Substance Use Topics  . Alcohol use: Yes    Alcohol/week: 0.0 standard drinks    Comment: none in the last 3 weeks  . Drug use: No      Current Outpatient Medications:  .  aspirin EC 81 MG EC tablet, Take 1 tablet (81 mg total) by mouth daily., Disp: , Rfl:  .  Dulaglutide (TRULICITY) 1.5 LK/5.6YB SOPN, Inject 1.5 mg into the skin once a week., Disp: 6 mL, Rfl: 3 .  empagliflozin (JARDIANCE) 25 MG TABS tablet, Take 25 mg by mouth daily before breakfast., Disp: 90 tablet, Rfl: 3 .  insulin detemir (LEVEMIR FLEXTOUCH) 100 UNIT/ML FlexPen, Inject 10-20 Units into the skin daily., Disp: 15 mL, Rfl: 3 .  lisinopril (ZESTRIL) 10 MG tablet, Take 1 tablet (10 mg total) by mouth daily., Disp: 90 tablet, Rfl: 3 .  meloxicam (MOBIC) 7.5 MG tablet, Take 1 tablet (7.5 mg total) by mouth daily as needed for pain., Disp: 30 tablet, Rfl: 0 .  metFORMIN (GLUCOPHAGE) 1000 MG tablet, Take 1 tablet (1,000 mg total) by mouth 2 (two) times daily with a meal., Disp: 180 tablet, Rfl: 3 .  rosuvastatin (CRESTOR) 40 MG tablet, Take 1 tablet (40 mg total) by mouth daily., Disp: 90 tablet, Rfl: 3 .  topiramate (TOPAMAX) 50 MG tablet, 2 (two) times daily. , Disp: , Rfl:  .  VASCEPA 1 g CAPS, TAKE 2 CAPSULES (2 G TOTAL) BY MOUTH 2 (TWO) TIMES A DAY., Disp: 360 capsule, Rfl: 0 .  Vitamin D, Ergocalciferol, (DRISDOL) 1.25 MG (50000 UNIT) CAPS capsule, Take 1 capsule (50,000 Units total) by mouth every 7 (seven) days. x12 weeks., Disp: 12 capsule, Rfl: 0 .  BD PEN NEEDLE MICRO U/F 32G X 6 MM MISC, USE FOR LEVEMIR INJECTION DAILY, Disp: 100 each, Rfl: 0 .  Blood Glucose Monitoring Suppl (D-CARE GLUCOMETER) w/Device KIT, 1 Units by Does not apply route 4 (four) times daily -  before meals and at bedtime., Disp: 1  kit, Rfl: 0  Allergies  Allergen Reactions  . No Known Allergies     Chart Review Today: I personally reviewed active problem list, medication list, allergies, family history, social history, health maintenance, notes from last encounter, lab results, imaging with the patient/caregiver today.   Review of Systems  10 Systems reviewed and are negative for acute change except as noted in the HPI.  Objective:    Vitals:  04/11/20 0941  BP: 116/72  Pulse: 98  Resp: 14  Temp: 98.1 F (36.7 C)  SpO2: 98%  Weight: 221 lb 12.8 oz (100.6 kg)  Height: 6' (1.829 m)    Body mass index is 30.08 kg/m.  Physical Exam Vitals and nursing note reviewed.  Constitutional:      General: He is not in acute distress.    Appearance: Normal appearance. He is well-developed. He is obese. He is not ill-appearing, toxic-appearing or diaphoretic.     Interventions: Face mask in place.  HENT:     Head: Normocephalic and atraumatic.     Jaw: No trismus.     Right Ear: External ear normal.     Left Ear: External ear normal.  Eyes:     General: Lids are normal. No scleral icterus.    Conjunctiva/sclera: Conjunctivae normal.     Pupils: Pupils are equal, round, and reactive to light.  Neck:     Trachea: Trachea and phonation normal. No tracheal deviation.  Cardiovascular:     Rate and Rhythm: Normal rate and regular rhythm.     Pulses: Normal pulses.          Radial pulses are 2+ on the right side and 2+ on the left side.       Posterior tibial pulses are 2+ on the right side and 2+ on the left side.     Heart sounds: Normal heart sounds. No murmur. No friction rub. No gallop.   Pulmonary:     Effort: Pulmonary effort is normal. No respiratory distress.     Breath sounds: Normal breath sounds. No stridor. No wheezing, rhonchi or rales.  Abdominal:     General: Bowel sounds are normal. There is no distension.     Palpations: Abdomen is soft.     Tenderness: There is no abdominal tenderness.  There is no guarding or rebound.  Musculoskeletal:        General: Normal range of motion.     Cervical back: Normal range of motion and neck supple.     Right lower leg: No edema.     Left lower leg: No edema.  Skin:    General: Skin is warm and dry.     Capillary Refill: Capillary refill takes less than 2 seconds.     Coloration: Skin is not jaundiced.     Findings: No rash.     Nails: There is no clubbing.  Neurological:     Mental Status: He is alert.     Cranial Nerves: No dysarthria or facial asymmetry.     Motor: No tremor or abnormal muscle tone.     Gait: Gait normal.  Psychiatric:        Mood and Affect: Mood normal.        Speech: Speech normal.        Behavior: Behavior normal. Behavior is cooperative.      PHQ2/9: Depression screen [facility] 2/9 04/11/2020 02/10/2020 01/17/2020 10/17/2019 08/15/2019  Decreased Interest 0 1 0 0 0  Down, Depressed, Hopeless 0 2 2 0 0  PHQ - 2 Score 0 3 2 0 0  Altered sleeping 0 3 3 0 2  Tired, decreased energy 0 2 2 0 0  Change in appetite 0 1 1 0 0  Feeling bad or failure about yourself  0 2 2 0 0  Trouble concentrating 0 1 0 0 0  Moving slowly or fidgety/restless 0 0 2 0 0  Suicidal thoughts 0 0  0 0 0  PHQ-9 Score 0 12 12 0 2  Difficult doing work/chores Not difficult at all Somewhat difficult Somewhat difficult Not difficult at all Not difficult at all  Some recent data might be hidden    phq 9 is neg, reviewed today  Fall Risk: Fall Risk  04/11/2020 02/10/2020 01/17/2020 10/17/2019 08/15/2019  Falls in the past year? 0 0 0 0 0  Number falls in past yr: 0 0 0 0 0  Injury with Fall? 0 0 0 0 0  Follow up - - - Falls evaluation completed Falls evaluation completed    Functional Status Survey: Is the patient deaf or have difficulty hearing?: No Does the patient have difficulty seeing, even when wearing glasses/contacts?: No Does the patient have difficulty concentrating, remembering, or making decisions?: No Does the patient have  difficulty walking or climbing stairs?: No Does the patient have difficulty dressing or bathing?: No Does the patient have difficulty doing errands alone such as visiting a doctor's office or shopping?: No   Assessment & Plan:     ICD-10-CM   1. Diabetes mellitus type 2 in obese (HCC)  E11.69 CBC with Differential/Platelet   O53.6 COMPLETE METABOLIC PANEL WITH GFR    Lipid panel    Hemoglobin A1c    rosuvastatin (CRESTOR) 40 MG tablet   uncontrolled, due for labs, better lifestyle effort, sugars sound slightly better controlled than last OV  2. Insulin dependent type 2 diabetes mellitus, uncontrolled (HCC)  E11.65 CBC with Differential/Platelet   U44.0 COMPLETE METABOLIC PANEL WITH GFR    Lipid panel    Hemoglobin A1c  3. Peripheral vascular insufficiency (HCC)  I73.9   4. Hyperlipidemia associated with type 2 diabetes mellitus (HCC)  H47.42 COMPLETE METABOLIC PANEL WITH GFR   E78.5 Lipid panel    rosuvastatin (CRESTOR) 40 MG tablet   compliant with statin, no myalgias, due for labs  5. Essential hypertension  V95 COMPLETE METABOLIC PANEL WITH GFR   well controlled, stable, monitor renal function and electrolytes  6. Encounter for medication monitoring  Z51.81 CBC with Differential/Platelet    COMPLETE METABOLIC PANEL WITH GFR    Lipid panel    Hemoglobin A1c  7. Smoker  F17.200    encouraged smoking cessation  8. New daily persistent headache  G44.52    per neuro- no improvement with meds, neg sleep apnea study - pending tap for IIH?  9. Papilledema  H47.10    Per past eye exam with [facility] , noted papilledema - still working with neuro on HA sx  10. Rhinitis, unspecified type  J31.0 fluticasone (FLONASE) 50 MCG/ACT nasal spray    levocetirizine (XYZAL) 5 MG tablet   pt encouraged to do daily tx of AR with antihistamines and flonase, worse sx since starting CPAP  11. OSA on CPAP  G47.33    Z99.89    per neuro     Return for 3 month f/up on DM HTN HLD,  come sooner if  blood sugar >200.   Delsa Grana, PA-C 04/11/20 9:52 AM

## 2020-04-12 ENCOUNTER — Other Ambulatory Visit: Payer: Self-pay | Admitting: Family Medicine

## 2020-04-12 LAB — CBC WITH DIFFERENTIAL/PLATELET
Absolute Monocytes: 812 cells/uL (ref 200–950)
Basophils Absolute: 62 cells/uL (ref 0–200)
Basophils Relative: 0.5 %
Eosinophils Absolute: 234 cells/uL (ref 15–500)
Eosinophils Relative: 1.9 %
HCT: 42.6 % (ref 38.5–50.0)
Hemoglobin: 14.4 g/dL (ref 13.2–17.1)
Lymphs Abs: 3050 cells/uL (ref 850–3900)
MCH: 31.8 pg (ref 27.0–33.0)
MCHC: 33.8 g/dL (ref 32.0–36.0)
MCV: 94 fL (ref 80.0–100.0)
MPV: 11.1 fL (ref 7.5–12.5)
Monocytes Relative: 6.6 %
Neutro Abs: 8143 cells/uL — ABNORMAL HIGH (ref 1500–7800)
Neutrophils Relative %: 66.2 %
Platelets: 324 10*3/uL (ref 140–400)
RBC: 4.53 10*6/uL (ref 4.20–5.80)
RDW: 13.2 % (ref 11.0–15.0)
Total Lymphocyte: 24.8 %
WBC: 12.3 10*3/uL — ABNORMAL HIGH (ref 3.8–10.8)

## 2020-04-12 LAB — LIPID PANEL
Cholesterol: 152 mg/dL (ref <200)
HDL: 44 mg/dL (ref >=40)
LDL Cholesterol (Calc): 86 mg/dL (calc)
Non-HDL Cholesterol (Calc): 108 mg/dL (calc) (ref <130)
Total CHOL/HDL Ratio: 3.5 (calc) (ref <5.0)
Triglycerides: 123 mg/dL (ref <150)

## 2020-04-12 LAB — COMPLETE METABOLIC PANEL WITH GFR
AG Ratio: 1.3 (calc) (ref 1.0–2.5)
ALT: 18 U/L (ref 9–46)
AST: 15 U/L (ref 10–35)
Albumin: 4.4 g/dL (ref 3.6–5.1)
Alkaline phosphatase (APISO): 72 U/L (ref 35–144)
BUN: 16 mg/dL (ref 7–25)
CO2: 26 mmol/L (ref 20–32)
Calcium: 10 mg/dL (ref 8.6–10.3)
Chloride: 104 mmol/L (ref 98–110)
Creat: 1.09 mg/dL (ref 0.70–1.33)
GFR, Est African American: 88 mL/min/{1.73_m2} (ref >=60)
GFR, Est Non African American: 76 mL/min/{1.73_m2} (ref >=60)
Globulin: 3.4 g/dL (calc) (ref 1.9–3.7)
Glucose, Bld: 101 mg/dL — ABNORMAL HIGH (ref 65–99)
Potassium: 4.7 mmol/L (ref 3.5–5.3)
Sodium: 138 mmol/L (ref 135–146)
Total Bilirubin: 0.3 mg/dL (ref 0.2–1.2)
Total Protein: 7.8 g/dL (ref 6.1–8.1)

## 2020-04-12 LAB — HEMOGLOBIN A1C
Hgb A1c MFr Bld: 6.8 % of total Hgb — ABNORMAL HIGH (ref <5.7)
Mean Plasma Glucose: 148 (calc)
eAG (mmol/L): 8.2 (calc)

## 2020-06-05 ENCOUNTER — Other Ambulatory Visit: Payer: Self-pay | Admitting: Family Medicine

## 2020-06-05 MED ORDER — BD PEN NEEDLE MICRO U/F 32G X 6 MM MISC: 0 refills | Status: DC

## 2020-06-05 NOTE — Telephone Encounter (Signed)
[id number] [place], [place] - [address]  [address] [place] [address]  Phone: [phone] Fax: [phone]   Refill request for his needles he states he only has 1 left

## 2020-06-05 NOTE — Telephone Encounter (Signed)
Requested medication (s) are due for refill today - yes- expired rx  Requested medication (s) are on the active medication list -yes  Future visit scheduled -yes  Last refill: 12/15/17  Notes to clinic: Request for Rx for diabetic supplies written by provider no longer with practice  Requested Prescriptions  Pending Prescriptions Disp Refills   Insulin Pen Needle (BD PEN NEEDLE MICRO U/F) 32G X 6 MM MISC 100 each 0    Sig: USE FOR LEVEMIR INJECTION DAILY      Endocrinology: Diabetes - Testing Supplies Passed - 06/05/2020  2:45 PM      Passed - Valid encounter within last 12 months    Recent Outpatient Visits           1 month ago Diabetes mellitus type 2 in obese [facility]   [facility] [place], Sheliah Mends, PA-C   3 months ago Insulin dependent type 2 diabetes mellitus, uncontrolled (HCC)   [facility] [place], Sheliah Mends, PA-C   4 months ago Insulin dependent type 2 diabetes mellitus, uncontrolled (HCC)   [facility] [place], Sheliah Mends, PA-C   7 months ago New daily persistent headache   [facility] [place], Irving Burton E, [place]   9 months ago DM (diabetes mellitus), type 2 with peripheral vascular complications [facility])   [facility] Doren Custard, FNP       Future Appointments             In 1 month Danelle Berry, PA-C [facility], [facility]                Requested Prescriptions  Pending Prescriptions Disp Refills   Insulin Pen Needle (BD PEN NEEDLE MICRO U/F) 32G X 6 MM MISC 100 each 0    Sig: USE FOR LEVEMIR INJECTION DAILY      Endocrinology: Diabetes - Testing Supplies Passed - 06/05/2020  2:45 PM      Passed - Valid encounter within last 12 months    Recent Outpatient Visits           1 month ago Diabetes mellitus type 2 in obese [facility])   [facility] [place], Sheliah Mends, PA-C   3 months ago Insulin dependent type 2 diabetes mellitus, uncontrolled (HCC)   [facility] [place], Sheliah Mends, PA-C   4 months ago Insulin dependent type 2 diabetes mellitus, uncontrolled [facility])   [facility] [place], Sheliah Mends, PA-C   7 months ago New daily persistent headache   [facility] [place], Irving Burton E, [place]   9 months ago DM (diabetes mellitus), type 2 with peripheral vascular complications [facility])   [facility] Doren Custard, FNP       Future Appointments             In 1 month Danelle Berry, PA-C [facility], [facility]

## 2020-06-24 ENCOUNTER — Other Ambulatory Visit: Payer: Self-pay | Admitting: Family Medicine

## 2020-06-24 DIAGNOSIS — I10 Essential (primary) hypertension: Secondary | ICD-10-CM

## 2020-07-02 ENCOUNTER — Other Ambulatory Visit: Payer: Self-pay | Admitting: Family Medicine

## 2020-07-12 ENCOUNTER — Other Ambulatory Visit: Payer: Self-pay

## 2020-07-12 ENCOUNTER — Encounter: Payer: Self-pay | Admitting: Family Medicine

## 2020-07-12 ENCOUNTER — Ambulatory Visit (INDEPENDENT_AMBULATORY_CARE_PROVIDER_SITE_OTHER): Payer: 59 | Admitting: Family Medicine

## 2020-07-12 VITALS — BP 122/86 | HR 92 | Temp 99.1°F | Resp 18 | Ht 72.0 in | Wt 219.4 lb

## 2020-07-12 DIAGNOSIS — Z114 Encounter for screening for human immunodeficiency virus [HIV]: Secondary | ICD-10-CM | POA: Diagnosis not present

## 2020-07-12 DIAGNOSIS — E669 Obesity, unspecified: Secondary | ICD-10-CM

## 2020-07-12 DIAGNOSIS — E1165 Type 2 diabetes mellitus with hyperglycemia: Secondary | ICD-10-CM

## 2020-07-12 DIAGNOSIS — E785 Hyperlipidemia, unspecified: Secondary | ICD-10-CM

## 2020-07-12 DIAGNOSIS — E1169 Type 2 diabetes mellitus with other specified complication: Secondary | ICD-10-CM

## 2020-07-12 DIAGNOSIS — E559 Vitamin D deficiency, unspecified: Secondary | ICD-10-CM

## 2020-07-12 DIAGNOSIS — I10 Essential (primary) hypertension: Secondary | ICD-10-CM | POA: Diagnosis not present

## 2020-07-12 DIAGNOSIS — G8929 Other chronic pain: Secondary | ICD-10-CM

## 2020-07-12 DIAGNOSIS — R519 Headache, unspecified: Secondary | ICD-10-CM

## 2020-07-12 DIAGNOSIS — Z5181 Encounter for therapeutic drug level monitoring: Secondary | ICD-10-CM | POA: Diagnosis not present

## 2020-07-12 NOTE — Patient Instructions (Signed)
Health Maintenance  Topic Date Due  . HIV Screening  Never done  . INFLUENZA VACCINE  07/01/2020  . OPHTHALMOLOGY EXAM  08/11/2020  . HEMOGLOBIN A1C  10/12/2020  . FOOT EXAM  01/16/2021  . TETANUS/TDAP  12/01/2021  . COLONOSCOPY  12/01/2024  . PNEUMOCOCCAL POLYSACCHARIDE VACCINE AGE 56-64 HIGH RISK  Completed  . COVID-19 Vaccine  Completed  . Hepatitis C Screening  Completed   Call to get your eye exam done in September  Please let me know if you would like a second opinion for neurology after your follow up appointment, I'll be happy to put in another referral if you want that for your headaches.   Come into clinic in the next 2 months for the flu shot

## 2020-07-12 NOTE — Progress Notes (Signed)
Name: Donald [name]   MRN: [id number]    DOB: 05-Oct-1966   Date:07/12/2020       Progress Note  Chief Complaint  Patient presents with  . Hypertension  . Hyperlipidemia  . Diabetes     Subjective:   Donald [name] is a 54 y.o. male, presents to clinic for HTN HLD and DM  DM:   Currently maging with trulicity, jardiance, metformin, levemir 11 units once a day in evening  Pt has no SE from meds. Blood sugars 120 - "they have been staying low" Denies: Polyuria, polydipsia, vision changes, neuropathy, hypoglycemia Recent pertinent labs: Lab Results  Component Value Date   HGBA1C 6.8 (H) 04/11/2020   HGBA1C 11.4 (H) 01/17/2020   HGBA1C 10.0 (H) 10/17/2019   Standard of care and health maintenance: Urine Microalbumin: done Foot exam:  utd DM eye exam:  Eye exam due next month ACEI/ARB:  Lisinopril 10 mg  Hypertension:  Currently managed on lisinopril 10 mg  Pt reports good med compliance and denies any SE.  No lightheadedness, hypotension, syncope. Blood pressure today is well controlled. BP Readings from Last 3 Encounters:  07/12/20 122/86  04/11/20 116/72  02/10/20 118/72   Pt denies CP, SOB, exertional sx, LE edema, palpitation, Ha's, visual disturbances   Hyperlipidemia: Currently treated with crestor 40 mg and vascepa (not really taking vascepa)   pt reports good med compliance otherwise Last Lipids: Lab Results  Component Value Date   CHOL 152 04/11/2020   HDL 44 04/11/2020   LDLCALC 86 04/11/2020   TRIG 123 04/11/2020   CHOLHDL 3.5 04/11/2020   - Denies: Chest pain, shortness of breath, myalgias, claudication  Diet efforts - healthy, cut out soda, drinks more water  HA's- for over a year, seeing neuro at [facility], currently tx with emgality and topamax - still seeing psych - some improvement in HA's with emgality.  Due for f/up eye exam - asked that he inquire about papilladema again.  Neuro has not done a tap, but HA's finally  improving    Current Outpatient Medications:  .  aspirin EC 81 MG EC tablet, Take 1 tablet (81 mg total) by mouth daily., Disp: , Rfl:  .  Blood Glucose Monitoring Suppl (D-CARE GLUCOMETER) w/Device KIT, 1 Units by Does not apply route 4 (four) times daily -  before meals and at bedtime., Disp: 1 kit, Rfl: 0 .  Dulaglutide (TRULICITY) 1.5 LS/9.3TD SOPN, Inject 1.5 mg into the skin once a week., Disp: 6 mL, Rfl: 3 .  empagliflozin (JARDIANCE) 25 MG TABS tablet, Take 25 mg by mouth daily before breakfast., Disp: 90 tablet, Rfl: 3 .  fluticasone (FLONASE) 50 MCG/ACT nasal spray, Place 2 sprays into both nostrils daily., Disp: 16 g, Rfl: 6 .  insulin detemir (LEVEMIR FLEXTOUCH) 100 UNIT/ML FlexPen, Inject 10-20 Units into the skin daily., Disp: 15 mL, Rfl: 3 .  levocetirizine (XYZAL) 5 MG tablet, Take 1 tablet (5 mg total) by mouth every evening., Disp: 30 tablet, Rfl: 2 .  lisinopril (ZESTRIL) 10 MG tablet, TAKE 1 TABLET BY MOUTH EVERY DAY, Disp: 90 tablet, Rfl: 3 .  meloxicam (MOBIC) 7.5 MG tablet, Take 1 tablet (7.5 mg total) by mouth daily as needed for pain., Disp: 30 tablet, Rfl: 0 .  metFORMIN (GLUCOPHAGE) 1000 MG tablet, Take 1 tablet (1,000 mg total) by mouth 2 (two) times daily with a meal., Disp: 180 tablet, Rfl: 3 .  rosuvastatin (CRESTOR) 40 MG tablet, Take 1 tablet (40 mg  total) by mouth at bedtime., Disp: 90 tablet, Rfl: 3 .  topiramate (TOPAMAX) 50 MG tablet, 2 (two) times daily. , Disp: , Rfl:  .  VASCEPA 1 g CAPS, TAKE 2 CAPSULES (2 G TOTAL) BY MOUTH 2 (TWO) TIMES A DAY., Disp: 360 capsule, Rfl: 0 .  Vitamin D, Ergocalciferol, (DRISDOL) 1.25 MG (50000 UNIT) CAPS capsule, TAKE 1 CAPSULE (50,000 UNITS TOTAL) BY MOUTH EVERY 7 (SEVEN) DAYS. X12 WEEKS., Disp: 12 capsule, Rfl: 0 .  Insulin Pen Needle (BD PEN NEEDLE MICRO U/F) 32G X 6 MM MISC, USE FOR LEVEMIR INJECTION DAILY (Patient not taking: Reported on 07/12/2020), Disp: 100 each, Rfl: 0  Patient Active Problem List   Diagnosis Date  Noted  . Chronic intractable headache 02/10/2020  . Diabetes mellitus type 2 in obese ([place]) 11/12/2018  . 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 Obesity (BMI 30.0-34.9) 05/27/2018  . Edema of esophagus   . Angiodysplasia of stomach and duodenum   . Stomach irritation   . Heartburn   . Smoker 05/11/2018  . Hypertension 09/15/2017  . Peripheral vascular insufficiency ([place]) 07/06/2017  . Hyperlipidemia associated with type 2 diabetes mellitus ([place]) 10/31/2016    Past Surgical History:  Procedure Laterality Date  . ESOPHAGOGASTRODUODENOSCOPY (EGD) WITH PROPOFOL N/A 05/26/2018   Procedure: ESOPHAGOGASTRODUODENOSCOPY (EGD) WITH PROPOFOL;  Surgeon: Virgel Manifold, MD;  Location: [facility] ENDOSCOPY;  Service: Endoscopy;  Laterality: N/A;  . KNEE SURGERY Right     Family History  Problem Relation Age of Onset  . Hypertension Father   . Diabetes Father   . Liver disease Father   . Diverticulitis Mother   . Migraines Mother   . Cancer Sister        tumors in the breast   . Diabetes Maternal Grandfather   . Cancer Sister        tumors in the breast   . Cancer Sister        tumor in the breast     Social History   Tobacco Use  . Smoking status: Current Every Day Smoker    Packs/day: 0.50    Years: 30.00    Pack years: 15.00    Types: Cigarettes  . Smokeless tobacco: Never Used  Vaping Use  . Vaping Use: Never used  Substance Use Topics  . Alcohol use: Yes    Alcohol/week: 0.0 standard drinks    Comment: none in the last 3 weeks  . Drug use: No     Allergies  Allergen Reactions  . No Known Allergies     Health Maintenance  Topic Date Due  . HIV Screening  Never done  . INFLUENZA VACCINE  07/01/2020  . OPHTHALMOLOGY EXAM  08/11/2020  . HEMOGLOBIN A1C  10/12/2020  . FOOT EXAM  01/16/2021  . TETANUS/TDAP  12/01/2021  . COLONOSCOPY  12/01/2024  . PNEUMOCOCCAL POLYSACCHARIDE VACCINE AGE 60-64 HIGH RISK  Completed  . COVID-19 Vaccine  Completed  . Hepatitis C  Screening  Completed    Chart Review Today: I personally reviewed active problem list, medication list, allergies, family history, social history, health maintenance, notes from last encounter, lab results, imaging with the patient/caregiver today.   Review of Systems  10 Systems reviewed and are negative for acute change except as noted in the HPI.    Objective:   Vitals:   07/12/20 1500  BP: 122/86  Pulse: 92  Resp: 18  Temp: 99.1 F (37.3 C)  TempSrc: Oral  SpO2: 99%  Weight: 219 lb 6.4  oz (99.5 kg)  Height: 6' (1.829 m)    Body mass index is 29.76 kg/m.  Physical Exam Vitals and nursing note reviewed.  Constitutional:      General: He is not in acute distress.    Appearance: Normal appearance. He is well-developed. He is not ill-appearing, toxic-appearing or diaphoretic.     Interventions: Face mask in place.  HENT:     Head: Normocephalic and atraumatic.     Jaw: No trismus.     Right Ear: External ear normal.     Left Ear: External ear normal.  Eyes:     General: Lids are normal. No scleral icterus.       Right eye: No discharge.        Left eye: No discharge.     Conjunctiva/sclera: Conjunctivae normal.  Neck:     Trachea: Trachea and phonation normal. No tracheal deviation.  Cardiovascular:     Rate and Rhythm: Normal rate and regular rhythm.     Pulses: Normal pulses.          Radial pulses are 2+ on the right side and 2+ on the left side.       Posterior tibial pulses are 2+ on the right side and 2+ on the left side.     Heart sounds: Normal heart sounds. No murmur heard.  No friction rub. No gallop.   Pulmonary:     Effort: Pulmonary effort is normal. No respiratory distress.     Breath sounds: Normal breath sounds. No stridor. No wheezing, rhonchi or rales.  Abdominal:     General: Bowel sounds are normal. There is no distension.     Palpations: Abdomen is soft.  Musculoskeletal:     Right lower leg: No edema.     Left lower leg: No edema.   Skin:    General: Skin is warm and dry.     Coloration: Skin is not jaundiced.     Findings: No rash.     Nails: There is no clubbing.  Neurological:     Mental Status: He is alert. Mental status is at baseline.     Cranial Nerves: No dysarthria or facial asymmetry.     Motor: No tremor or abnormal muscle tone.     Gait: Gait normal.  Psychiatric:        Mood and Affect: Mood normal.        Speech: Speech normal.        Behavior: Behavior normal. Behavior is cooperative.         Assessment & Plan:   1. Diabetes mellitus type 2 in obese (HCC) Well controlled, last A1C at goal - continue trulicity, jardiance, metformin, levemir 11 units Due for eye exam soon - Hemoglobin A1C - COMPLETE METABOLIC PANEL WITH GFR  2. Hyperlipidemia associated with type 2 diabetes mellitus ([place]) Compliant with meds, no SE, no myalgias, fatigue or jaundice Last lipid well controlled, recheck CMP to monitor liver function, continue working on healthy diet, lifestyle and exercise - COMPLETE METABOLIC PANEL WITH GFR  3. Essential hypertension Stable, well controlled, BP at goal today, continue lisinopril 10 mg daily - COMPLETE METABOLIC PANEL WITH GFR  4. Encounter for medication monitoring - Hemoglobin A1C - COMPLETE METABOLIC PANEL WITH GFR - CBC with Differential/Platelet - VITAMIN D 25 Hydroxy (Vit-D Deficiency, Fractures)  5. Screening for HIV (human immunodeficiency virus) - HIV antibody (with reflex)  6. Vitamin D deficiency Hx of very low vit D before, pt would like  it rechecked Discussed OTC supplementation - COMPLETE METABOLIC PANEL WITH GFR - VITAMIN D 25 Hydroxy (Vit-D Deficiency, Fractures)  7. Chronic intractable headache, unspecified headache type Per [facility] - improved once on emgality - Galcanezumab-gnlm (EMGALITY) 120 MG/ML SOAJ; Inject 120 mg into the skin. 28 days   Return in about 6 months (around 01/12/2021) for Routine follow-up in office DM/HTN/HLD labs  foot exam .   Delsa Grana, PA-C 07/12/20 3:16 PM

## 2020-07-13 LAB — COMPLETE METABOLIC PANEL WITH GFR
AG Ratio: 1.3 (calc) (ref 1.0–2.5)
ALT: 37 U/L (ref 9–46)
AST: 22 U/L (ref 10–35)
Albumin: 4.6 g/dL (ref 3.6–5.1)
Alkaline phosphatase (APISO): 79 U/L (ref 35–144)
BUN: 16 mg/dL (ref 7–25)
CO2: 26 mmol/L (ref 20–32)
Calcium: 10.9 mg/dL — ABNORMAL HIGH (ref 8.6–10.3)
Chloride: 105 mmol/L (ref 98–110)
Creat: 1.14 mg/dL (ref 0.70–1.33)
GFR, Est African American: 83 mL/min/{1.73_m2} (ref >=60)
GFR, Est Non African American: 71 mL/min/{1.73_m2} (ref >=60)
Globulin: 3.5 g/dL (calc) (ref 1.9–3.7)
Glucose, Bld: 101 mg/dL — ABNORMAL HIGH (ref 65–99)
Potassium: 4.5 mmol/L (ref 3.5–5.3)
Sodium: 140 mmol/L (ref 135–146)
Total Bilirubin: 0.3 mg/dL (ref 0.2–1.2)
Total Protein: 8.1 g/dL (ref 6.1–8.1)

## 2020-07-13 LAB — CBC WITH DIFFERENTIAL/PLATELET
Absolute Monocytes: 785 cells/uL (ref 200–950)
Basophils Absolute: 71 cells/uL (ref 0–200)
Basophils Relative: 0.7 %
Eosinophils Absolute: 133 cells/uL (ref 15–500)
Eosinophils Relative: 1.3 %
HCT: 44.8 % (ref 38.5–50.0)
Hemoglobin: 15.2 g/dL (ref 13.2–17.1)
Lymphs Abs: 3203 cells/uL (ref 850–3900)
MCH: 31.9 pg (ref 27.0–33.0)
MCHC: 33.9 g/dL (ref 32.0–36.0)
MCV: 93.9 fL (ref 80.0–100.0)
MPV: 11.1 fL (ref 7.5–12.5)
Monocytes Relative: 7.7 %
Neutro Abs: 6008 cells/uL (ref 1500–7800)
Neutrophils Relative %: 58.9 %
Platelets: 318 10*3/uL (ref 140–400)
RBC: 4.77 10*6/uL (ref 4.20–5.80)
RDW: 13.1 % (ref 11.0–15.0)
Total Lymphocyte: 31.4 %
WBC: 10.2 10*3/uL (ref 3.8–10.8)

## 2020-07-13 LAB — VITAMIN D 25 HYDROXY (VIT D DEFICIENCY, FRACTURES): Vit D, 25-Hydroxy: 63 ng/mL (ref 30–100)

## 2020-07-13 LAB — HEMOGLOBIN A1C
Hgb A1c MFr Bld: 6.9 % of total Hgb — ABNORMAL HIGH (ref <5.7)
Mean Plasma Glucose: 151 (calc)
eAG (mmol/L): 8.4 (calc)

## 2020-07-13 LAB — HIV ANTIBODY (ROUTINE TESTING W REFLEX): HIV 1&2 Ab, 4th Generation: NONREACTIVE

## 2020-07-16 ENCOUNTER — Other Ambulatory Visit: Payer: Self-pay | Admitting: Family Medicine

## 2020-07-31 ENCOUNTER — Encounter: Payer: Self-pay | Admitting: Family Medicine

## 2020-12-12 ENCOUNTER — Encounter: Payer: Self-pay | Admitting: Family Medicine

## 2020-12-17 NOTE — Telephone Encounter (Signed)
Pt is scheduled for 12/18/20 @ 10:20 am. He is feeling ok today

## 2020-12-18 ENCOUNTER — Ambulatory Visit (INDEPENDENT_AMBULATORY_CARE_PROVIDER_SITE_OTHER): Payer: 59 | Admitting: Family Medicine

## 2020-12-18 ENCOUNTER — Encounter: Payer: Self-pay | Admitting: Family Medicine

## 2020-12-18 ENCOUNTER — Other Ambulatory Visit: Payer: Self-pay

## 2020-12-18 VITALS — BP 118/64 | HR 74 | Temp 99.4°F | Resp 14 | Ht 72.0 in | Wt 214.1 lb

## 2020-12-18 DIAGNOSIS — E1169 Type 2 diabetes mellitus with other specified complication: Secondary | ICD-10-CM

## 2020-12-18 DIAGNOSIS — G8929 Other chronic pain: Secondary | ICD-10-CM

## 2020-12-18 DIAGNOSIS — Z794 Long term (current) use of insulin: Secondary | ICD-10-CM

## 2020-12-18 DIAGNOSIS — I10 Essential (primary) hypertension: Secondary | ICD-10-CM

## 2020-12-18 DIAGNOSIS — IMO0002 Reserved for concepts with insufficient information to code with codable children: Secondary | ICD-10-CM

## 2020-12-18 DIAGNOSIS — E669 Obesity, unspecified: Secondary | ICD-10-CM

## 2020-12-18 DIAGNOSIS — Z5181 Encounter for therapeutic drug level monitoring: Secondary | ICD-10-CM | POA: Diagnosis not present

## 2020-12-18 DIAGNOSIS — R739 Hyperglycemia, unspecified: Secondary | ICD-10-CM

## 2020-12-18 DIAGNOSIS — E1165 Type 2 diabetes mellitus with hyperglycemia: Secondary | ICD-10-CM

## 2020-12-18 DIAGNOSIS — R519 Headache, unspecified: Secondary | ICD-10-CM

## 2020-12-18 DIAGNOSIS — E1151 Type 2 diabetes mellitus with diabetic peripheral angiopathy without gangrene: Secondary | ICD-10-CM

## 2020-12-18 MED ORDER — METFORMIN HCL 1000 MG PO TABS: 1000.0000 mg | ORAL_TABLET | Freq: Two times a day (BID) | ORAL | 3 refills | Status: DC

## 2020-12-18 MED ORDER — TRULICITY 1.5 MG/0.5ML ~~LOC~~ SOAJ: 1.5000 mg | SUBCUTANEOUS | 3 refills | Status: DC

## 2020-12-18 MED ORDER — EMPAGLIFLOZIN 25 MG PO TABS: 25.0000 mg | ORAL_TABLET | Freq: Every day | ORAL | 3 refills | Status: DC

## 2020-12-18 MED ORDER — LEVEMIR FLEXTOUCH 100 UNIT/ML ~~LOC~~ SOPN: 15.0000 [IU] | PEN_INJECTOR | Freq: Every day | SUBCUTANEOUS | 3 refills | Status: DC

## 2020-12-18 NOTE — Patient Instructions (Signed)
If your blood sugars in the morning are still over 200 - increase you insulin to 16 units and wait 3 days and if still over 200 then increase again to 20 units.  Avoid sugary drinks and foods Monitor your morning fasting blood sugars daily Follow up in the next couple weeks

## 2020-12-18 NOTE — Progress Notes (Deleted)
Name: Donald [name]   MRN: [id number]    DOB: May 19, 1966   Date:12/18/2020       Progress Note  Chief Complaint  Patient presents with  . Diabetes    DM running in 400's acoording to med list he is currently only taking Levemir 12 units daily and metformin     Subjective:   Donald [name] is a 55 y.o. male, presents to clinic for   DM:   Pt managing DM with Metformin and levemir Reports *** med compliance Pt has *** SE from meds. Blood sugars Running in high 400's Denies: Polyuria, polydipsia, vision changes, neuropathy, hypoglycemia Recent pertinent labs: Lab Results  Component Value Date   HGBA1C 6.9 (H) 07/12/2020   HGBA1C 6.8 (H) 04/11/2020   HGBA1C 11.4 (H) 01/17/2020   Standard of care and health maintenance: Urine Microalbumin:  01/16/21 Foot exam:  01/16/21 DM eye exam:  Needs to be ordered ACEI/ARB:  Lpisinol Statin:  Crestor   Current Outpatient Medications:  .  aspirin EC 81 MG EC tablet, Take 1 tablet (81 mg total) by mouth daily., Disp: , Rfl:  .  Blood Glucose Monitoring Suppl (D-CARE GLUCOMETER) w/Device KIT, 1 Units by Does not apply route 4 (four) times daily -  before meals and at bedtime., Disp: 1 kit, Rfl: 0 .  Galcanezumab-gnlm (EMGALITY) 120 MG/ML SOAJ, Inject 120 mg into the skin. 28 days, Disp: , Rfl:  .  insulin detemir (LEVEMIR FLEXTOUCH) 100 UNIT/ML FlexPen, Inject 10-20 Units into the skin daily., Disp: 15 mL, Rfl: 3 .  Insulin Pen Needle (BD PEN NEEDLE MICRO U/F) 32G X 6 MM MISC, USE FOR LEVEMIR INJECTION DAILY, Disp: 100 each, Rfl: 0 .  lisinopril (ZESTRIL) 10 MG tablet, TAKE 1 TABLET BY MOUTH EVERY DAY, Disp: 90 tablet, Rfl: 3 .  metFORMIN (GLUCOPHAGE) 1000 MG tablet, Take 1 tablet (1,000 mg total) by mouth 2 (two) times daily with a meal., Disp: 180 tablet, Rfl: 3 .  rosuvastatin (CRESTOR) 40 MG tablet, Take 1 tablet (40 mg total) by mouth at bedtime., Disp: 90 tablet, Rfl: 3 .  topiramate (TOPAMAX) 50 MG tablet, 2 (two) times daily. ,  Disp: , Rfl:   Patient Active Problem List   Diagnosis Date Noted  . Chronic intractable headache 02/10/2020  . Diabetes mellitus type 2 in obese ([place]) 11/12/2018  . 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 Obesity (BMI 30.0-34.9) 05/27/2018  . Edema of esophagus   . Angiodysplasia of stomach and duodenum   . Stomach irritation   . Heartburn   . Smoker 05/11/2018  . Hypertension 09/15/2017  . Hx of transient ischemic attack (TIA) 08/05/2017  . Peripheral vascular insufficiency ([place]) 07/06/2017  . Hyperlipidemia associated with type 2 diabetes mellitus ([place]) 10/31/2016    Past Surgical History:  Procedure Laterality Date  . ESOPHAGOGASTRODUODENOSCOPY (EGD) WITH PROPOFOL N/A 05/26/2018   Procedure: ESOPHAGOGASTRODUODENOSCOPY (EGD) WITH PROPOFOL;  Surgeon: Virgel Manifold, MD;  Location: [facility] ENDOSCOPY;  Service: Endoscopy;  Laterality: N/A;  . KNEE SURGERY Right     Family History  Problem Relation Age of Onset  . Hypertension Father   . Diabetes Father   . Liver disease Father   . Diverticulitis Mother   . Migraines Mother   . Cancer Sister        tumors in the breast   . Diabetes Maternal Grandfather   . Cancer Sister        tumors in the breast   . Cancer Sister  tumor in the breast     Social History   Tobacco Use  . Smoking status: Current Every Day Smoker    Packs/day: 0.50    Years: 30.00    Pack years: 15.00    Types: Cigarettes  . Smokeless tobacco: Never Used  Vaping Use  . Vaping Use: Never used  Substance Use Topics  . Alcohol use: Yes    Alcohol/week: 0.0 standard drinks    Comment: none in the last 3 weeks  . Drug use: No     Allergies  Allergen Reactions  . No Known Allergies     Health Maintenance  Topic Date Due  . INFLUENZA VACCINE  07/01/2020  . OPHTHALMOLOGY EXAM  08/11/2020  . COVID-19 Vaccine (3 - Booster for Pfizer series) 10/05/2020  . HEMOGLOBIN A1C  01/12/2021  . FOOT EXAM  01/16/2021  . TETANUS/TDAP  12/01/2021  .  COLONOSCOPY (Pts 45-14yr Insurance coverage will need to be confirmed)  12/01/2024  . PNEUMOCOCCAL POLYSACCHARIDE VACCINE AGE 10-64 HIGH RISK  Completed  . Hepatitis C Screening  Completed  . HIV Screening  Completed    Chart Review Today: ***  Review of Systems   Objective:   Vitals:   12/18/20 1027  BP: 118/64  Pulse: 74  Resp: 14  Temp: 99.4 F (37.4 C)  SpO2: 98%  Weight: 214 lb 1.6 oz (97.1 kg)  Height: 6' (1.829 m)    Body mass index is 29.04 kg/m.  Physical Exam      Assessment & Plan:   ***  No follow-ups on file.   JCathrine Muster CMA 12/18/20 10:34 AM

## 2020-12-18 NOTE — Progress Notes (Signed)
Name: Donald [name]   MRN: [id number]    DOB: [birth year]   Date:12/18/2020       Progress Note  Chief Complaint  Patient presents with  . Diabetes    DM running in 400's acoording to med list he is currently only taking Levemir 12 units daily and metformin     Subjective:   Donald [name] is a 55 y.o. male, presents to clinic for hyperglycemia x 1-2 weeks 300-400's with hx of well controlled IDDM, HA syndrome worse, COVID exposure between christmas and new years, some mild URI sx he did get tested and he quarantined  IDDM DM:   Pt managing DM with metformin 1000 mg BID, levemir 12 units once daily, jardiance and trulicity - he hasn't taken jardianc and trulicity because the pharmacy didn't call him with refills - though these meds were not D/C and he should have had enough until March 2022 Pt has no SE from meds. Blood sugars well controlled except for the past one to two weeks has been 300-400 - when high pt felt bad and had Polyuria Denies: , polydipsia, vision changes, neuropathy, hypoglycemia Recent pertinent labs: Lab Results  Component Value Date   HGBA1C 6.9 (H) 07/12/2020   HGBA1C 6.8 (H) 04/11/2020   HGBA1C 11.4 (H) 01/17/2020   Standard of care and health maintenance: Foot exam:  UTD DM eye exam:  due ACEI/ARB:  lisinopril Statin:  yes   HA syndrome - managed by [facility] neurology - recently out of his meds and not compliant with CPAP - he has seen neuro - trouble with getting his meds covered by insurance     Current Outpatient Medications:  .  aspirin EC 81 MG EC tablet, Take 1 tablet (81 mg total) by mouth daily., Disp: , Rfl:  .  Blood Glucose Monitoring Suppl (D-CARE GLUCOMETER) w/Device KIT, 1 Units by Does not apply route 4 (four) times daily -  before meals and at bedtime., Disp: 1 kit, Rfl: 0 .  Galcanezumab-gnlm (EMGALITY) 120 MG/ML SOAJ, Inject 120 mg into the skin. 28 days, Disp: , Rfl:  .  insulin detemir (LEVEMIR FLEXTOUCH) 100 UNIT/ML FlexPen,  Inject 10-20 Units into the skin daily., Disp: 15 mL, Rfl: 3 .  Insulin Pen Needle (BD PEN NEEDLE MICRO U/F) 32G X 6 MM MISC, USE FOR LEVEMIR INJECTION DAILY, Disp: 100 each, Rfl: 0 .  lisinopril (ZESTRIL) 10 MG tablet, TAKE 1 TABLET BY MOUTH EVERY DAY, Disp: 90 tablet, Rfl: 3 .  metFORMIN (GLUCOPHAGE) 1000 MG tablet, Take 1 tablet (1,000 mg total) by mouth 2 (two) times daily with a meal., Disp: 180 tablet, Rfl: 3 .  rosuvastatin (CRESTOR) 40 MG tablet, Take 1 tablet (40 mg total) by mouth at bedtime., Disp: 90 tablet, Rfl: 3 .  topiramate (TOPAMAX) 50 MG tablet, 2 (two) times daily. , Disp: , Rfl:   Patient Active Problem List   Diagnosis Date Noted  . Chronic intractable headache 02/10/2020  . Diabetes mellitus type 2 in obese ([place]) 11/12/2018  . 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 Obesity (BMI 30.0-34.9) 05/27/2018  . Edema of esophagus   . Angiodysplasia of stomach and duodenum   . Stomach irritation   . Heartburn   . Smoker 05/11/2018  . Hypertension 09/15/2017  . Hx of transient ischemic attack (TIA) 08/05/2017  . Peripheral vascular insufficiency ([place]) 07/06/2017  . Hyperlipidemia associated with type 2 diabetes mellitus ([place]) 10/31/2016    Past Surgical History:  Procedure Laterality Date  .  ESOPHAGOGASTRODUODENOSCOPY (EGD) WITH PROPOFOL N/A 05/26/2018   Procedure: ESOPHAGOGASTRODUODENOSCOPY (EGD) WITH PROPOFOL;  Surgeon: Pasty Spillers, MD;  Location: [facility] ENDOSCOPY;  Service: Endoscopy;  Laterality: N/A;  . KNEE SURGERY Right     Family History  Problem Relation Age of Onset  . Hypertension Father   . Diabetes Father   . Liver disease Father   . Diverticulitis Mother   . Migraines Mother   . Cancer Sister        tumors in the breast   . Diabetes Maternal Grandfather   . Cancer Sister        tumors in the breast   . Cancer Sister        tumor in the breast     Social History   Tobacco Use  . Smoking status: Current Every Day Smoker    Packs/day: 0.50     Years: 30.00    Pack years: 15.00    Types: Cigarettes  . Smokeless tobacco: Never Used  Vaping Use  . Vaping Use: Never used  Substance Use Topics  . Alcohol use: Yes    Alcohol/week: 0.0 standard drinks    Comment: none in the last 3 weeks  . Drug use: No     Allergies  Allergen Reactions  . No Known Allergies     Health Maintenance  Topic Date Due  . INFLUENZA VACCINE  07/01/2020  . OPHTHALMOLOGY EXAM  08/11/2020  . COVID-19 Vaccine (3 - Booster for Pfizer series) 10/05/2020  . HEMOGLOBIN A1C  01/12/2021  . FOOT EXAM  01/16/2021  . TETANUS/TDAP  12/01/2021  . COLONOSCOPY (Pts 45-31yrs Insurance coverage will need to be confirmed)  12/01/2024  . PNEUMOCOCCAL POLYSACCHARIDE VACCINE AGE 74-64 HIGH RISK  Completed  . Hepatitis C Screening  Completed  . HIV Screening  Completed    Chart Review Today: I personally reviewed active problem list, medication list, allergies, family history, social history, health maintenance, notes from last encounter, lab results, imaging with the patient/caregiver today.   Review of Systems  Constitutional: Negative.   HENT: Negative.   Eyes: Negative.   Respiratory: Negative.   Cardiovascular: Negative.   Gastrointestinal: Negative.   Endocrine: Negative.   Genitourinary: Negative.   Musculoskeletal: Negative.   Skin: Negative.   Allergic/Immunologic: Negative.   Neurological: Negative.   Hematological: Negative.   Psychiatric/Behavioral: Negative.   All other systems reviewed and are negative.    Objective:   Vitals:   12/18/20 1027  BP: 118/64  Pulse: 74  Resp: 14  Temp: 99.4 F (37.4 C)  SpO2: 98%  Weight: 214 lb 1.6 oz (97.1 kg)  Height: 6' (1.829 m)    Body mass index is 29.04 kg/m.  Physical Exam Vitals and nursing note reviewed.  Constitutional:      General: He is not in acute distress.    Appearance: Normal appearance. He is well-developed. He is not ill-appearing, toxic-appearing or diaphoretic.      Interventions: Face mask in place.  HENT:     Head: Normocephalic and atraumatic.     Jaw: No trismus.     Right Ear: External ear normal.     Left Ear: External ear normal.  Eyes:     General: Lids are normal. No scleral icterus.       Right eye: No discharge.        Left eye: No discharge.     Conjunctiva/sclera: Conjunctivae normal.  Neck:     Trachea: Trachea and phonation normal. No  tracheal deviation.  Cardiovascular:     Rate and Rhythm: Normal rate and regular rhythm.     Pulses: Normal pulses.          Radial pulses are 2+ on the right side and 2+ on the left side.       Posterior tibial pulses are 2+ on the right side and 2+ on the left side.     Heart sounds: Normal heart sounds. No murmur heard. No friction rub. No gallop.   Pulmonary:     Effort: Pulmonary effort is normal. No respiratory distress.     Breath sounds: Normal breath sounds. No stridor. No wheezing, rhonchi or rales.  Abdominal:     General: Bowel sounds are normal. There is no distension.     Palpations: Abdomen is soft.  Musculoskeletal:     Right lower leg: No edema.     Left lower leg: No edema.  Skin:    General: Skin is warm and dry.     Coloration: Skin is not jaundiced.     Findings: No rash.     Nails: There is no clubbing.  Neurological:     Mental Status: He is alert. Mental status is at baseline.     Cranial Nerves: No dysarthria or facial asymmetry.     Motor: No tremor or abnormal muscle tone.     Gait: Gait normal.  Psychiatric:        Mood and Affect: Mood normal.        Speech: Speech normal.        Behavior: Behavior normal. Behavior is cooperative.         Assessment & Plan:     ICD-10-CM   1. Insulin dependent type 2 diabetes mellitus, uncontrolled (HCC)  E11.65 Dulaglutide (TRULICITY) 1.5 YQ/0.3KV SOPN   Z79.4 empagliflozin (JARDIANCE) 25 MG TABS tablet    metFORMIN (GLUCOPHAGE) 1000 MG tablet    insulin detemir (LEVEMIR FLEXTOUCH) 100 UNIT/ML FlexPen    Insulin  Pen Needle (BD PEN NEEDLE MICRO U/F) 32G X 6 MM MISC   uncontrolled, sugars 300-400's, not taking all of his meds, labs today, refilled all meds, reviewed with pt to continue meds and titrate insulin as needed  2. Diabetes mellitus type 2 in obese (HCC)  Q25.95 COMPLETE METABOLIC PANEL WITH GFR   E66.9 Hemoglobin A1c   titrate insulin dose if needed to keep fasting CBG <150, restart all meds - levemir, trulicity, metformin, jardiance   3. Essential hypertension  G38 COMPLETE METABOLIC PANEL WITH GFR   stable, well controlled, BP at goal today, continue lisinopril 10 mg daily  4. Encounter for medication monitoring  Z51.81 CBC with Differential/Platelet    COMPLETE METABOLIC PANEL WITH GFR    Hemoglobin A1c  5. Chronic intractable headache, unspecified headache type  R51.9    G89.29    reviewed [facility] records and tx plan, encouraged pt to f/up with specialists and pharmacy  6. Hyperglycemia  V56.4 COMPLETE METABOLIC PANEL WITH GFR    Hemoglobin A1c   likely multifactorial - non-compliant with meds, having pain/HA's, watch diet     Return for 1 month f/up with meter.   Donald Grana, PA-C 12/18/20 10:49 AM

## 2020-12-19 LAB — CBC WITH DIFFERENTIAL/PLATELET
Absolute Monocytes: 957 cells/uL — ABNORMAL HIGH (ref 200–950)
Basophils Absolute: 110 cells/uL (ref 0–200)
Basophils Relative: 1 %
Eosinophils Absolute: 132 cells/uL (ref 15–500)
Eosinophils Relative: 1.2 %
HCT: 39.3 % (ref 38.5–50.0)
Hemoglobin: 13 g/dL — ABNORMAL LOW (ref 13.2–17.1)
Lymphs Abs: 3036 cells/uL (ref 850–3900)
MCH: 31.3 pg (ref 27.0–33.0)
MCHC: 33.1 g/dL (ref 32.0–36.0)
MCV: 94.7 fL (ref 80.0–100.0)
MPV: 12.2 fL (ref 7.5–12.5)
Monocytes Relative: 8.7 %
Neutro Abs: 6765 cells/uL (ref 1500–7800)
Neutrophils Relative %: 61.5 %
Platelets: 285 10*3/uL (ref 140–400)
RBC: 4.15 10*6/uL — ABNORMAL LOW (ref 4.20–5.80)
RDW: 12.8 % (ref 11.0–15.0)
Total Lymphocyte: 27.6 %
WBC: 11 10*3/uL — ABNORMAL HIGH (ref 3.8–10.8)

## 2020-12-19 LAB — COMPLETE METABOLIC PANEL WITH GFR
AG Ratio: 1.3 (calc) (ref 1.0–2.5)
ALT: 25 U/L (ref 9–46)
AST: 15 U/L (ref 10–35)
Albumin: 4.2 g/dL (ref 3.6–5.1)
Alkaline phosphatase (APISO): 78 U/L (ref 35–144)
BUN: 20 mg/dL (ref 7–25)
CO2: 27 mmol/L (ref 20–32)
Calcium: 9.7 mg/dL (ref 8.6–10.3)
Chloride: 101 mmol/L (ref 98–110)
Creat: 1.03 mg/dL (ref 0.70–1.33)
GFR, Est African American: 94 mL/min/{1.73_m2} (ref >=60)
GFR, Est Non African American: 81 mL/min/{1.73_m2} (ref >=60)
Globulin: 3.2 g/dL (calc) (ref 1.9–3.7)
Glucose, Bld: 313 mg/dL — ABNORMAL HIGH (ref 65–99)
Potassium: 4.2 mmol/L (ref 3.5–5.3)
Sodium: 136 mmol/L (ref 135–146)
Total Bilirubin: 0.3 mg/dL (ref 0.2–1.2)
Total Protein: 7.4 g/dL (ref 6.1–8.1)

## 2020-12-19 LAB — HEMOGLOBIN A1C
Hgb A1c MFr Bld: 13.6 % of total Hgb — ABNORMAL HIGH (ref <5.7)
Mean Plasma Glucose: 344 mg/dL
eAG (mmol/L): 19 mmol/L

## 2020-12-25 ENCOUNTER — Encounter: Payer: Self-pay | Admitting: Family Medicine

## 2020-12-25 MED ORDER — BD PEN NEEDLE MICRO U/F 32G X 6 MM MISC: 5 refills | Status: DC

## 2020-12-26 NOTE — Addendum Note (Signed)
Addended by: Danelle Berry on: 12/26/2020 02:05 PM   Modules accepted: Orders

## 2020-12-27 ENCOUNTER — Telehealth: Payer: Self-pay | Admitting: *Deleted

## 2020-12-27 NOTE — Chronic Care Management (AMB) (Signed)
  Care Management   Outreach Note  12/27/2020 Name: Donald [name] MRN: [id number] DOB: [birth year]  Donald [name] is a 55 y.o. year old male who is a primary care patient of Danelle Berry, [place]. I reached out to Donald [name] by phone today in response to a referral sent by Donald [name] PCP, Danelle Berry, PA-C.      An unsuccessful telephone outreach was attempted today. The patient was referred to the case management team for assistance with care management and care coordination.   Follow Up Plan: A HIPAA compliant phone message was left for the patient providing contact information and requesting a return call. The care management team will reach out to the patient again over the next 7 days. If patient returns call to provider office, please advise to call Embedded Care Management Care Guide Gwenevere Ghazi at [phone].  Gwenevere Ghazi  Care Guide, Embedded Care Coordination [facility] Management  Direct Dial: [phone]

## 2021-01-01 NOTE — Chronic Care Management (AMB) (Signed)
  Care Management   Outreach Note  01/01/2021 Name: Donald [name] MRN: [id number] DOB: 10/13/1966  Donald [name] is a 55 y.o. year old male who is a primary care patient of Danelle Berry, [place]. I reached out to Donald [name] by phone today in response to a referral sent by Mr. Cavion Faiola Brim's PCP, Danelle Berry, PA-C  A second unsuccessful telephone outreach was attempted today. The patient was referred to the case management team for assistance with care management and care coordination.   Follow Up Plan: The care management team will reach out to the patient again over the next 7 days. If patient returns call to provider office, please advise to call Embedded Care Management Care Guide Gwenevere Ghazi at [phone].  Gwenevere Ghazi  Care Guide, Embedded Care Coordination [facility] Management

## 2021-01-09 NOTE — Chronic Care Management (AMB) (Signed)
  Care Management   Note  01/09/2021 Name: Donald [name] MRN: [id number] DOB: 1966-02-03  Donald [name] is a 55 y.o. year old male who is a primary care patient of Danelle Berry, [place]. I reached out to Donald [name] by phone today in response to a referral sent by [name]'s PCP, Danelle Berry, PA-C.    [name] was given information about care management services today including:  1. Care management services include personalized support from designated clinical staff supervised by his physician, including individualized plan of care and coordination with other care providers 2. 24/7 contact phone numbers for assistance for urgent and routine care needs. 3. The patient may stop care management services at any time by phone call to the office staff.  Patient agreed to services and verbal consent obtained.   Follow up plan: Telephone appointment with care management team member scheduled for:01/23/2021  [facility] Guide, Embedded Care Coordination [facility]  Care Management  Direct Dial: [phone]

## 2021-01-10 ENCOUNTER — Telehealth: Payer: Self-pay

## 2021-01-10 NOTE — Chronic Care Management (AMB) (Signed)
° ° °  Chronic Care Management Pharmacy Assistant   Name: Donald [name]  MRN: [id number] DOB: [birth year]  Reason for Encounter: Patient Assistance Coordination  PCP : Delsa Grana, PA-C    01/10/2021- Patient assistance forms filled out for Levemir with [employer] patient assistance program and for Jardiance with [place] patient assistance program. Spoke with patient he would like applications mailed to him. Patient aware I will highlight the area he would need to fill in and/or sign. Patient aware he will need a copy of his annual income for each application. Patient aware to bring completed forms and documentation to PCP office to be signed by Delsa Grana, PA- C and faxed. Junius Argyle, CPP notified.    Allergies:   Allergies  Allergen Reactions   No Known Allergies     Medications: Outpatient Encounter Medications as of 01/10/2021  Medication Sig   aspirin EC 81 MG EC tablet Take 1 tablet (81 mg total) by mouth daily.   Blood Glucose Monitoring Suppl (D-CARE GLUCOMETER) w/Device KIT 1 Units by Does not apply route 4 (four) times daily -  before meals and at bedtime.   Dulaglutide (TRULICITY) 1.5 PF/2.9WK SOPN Inject 1.5 mg into the skin once a week.   empagliflozin (JARDIANCE) 25 MG TABS tablet Take 1 tablet (25 mg total) by mouth daily before breakfast.   Galcanezumab-gnlm (EMGALITY) 120 MG/ML SOAJ Inject 120 mg into the skin. 28 days   insulin detemir (LEVEMIR FLEXTOUCH) 100 UNIT/ML FlexPen Inject 15-30 Units into the skin at bedtime.   Insulin Pen Needle (BD PEN NEEDLE MICRO U/F) 32G X 6 MM MISC USE FOR LEVEMIR INJECTION DAILY   lisinopril (ZESTRIL) 10 MG tablet TAKE 1 TABLET BY MOUTH EVERY DAY   metFORMIN (GLUCOPHAGE) 1000 MG tablet Take 1 tablet (1,000 mg total) by mouth 2 (two) times daily with a meal.   rosuvastatin (CRESTOR) 40 MG tablet Take 1 tablet (40 mg total) by mouth at bedtime.   topiramate (TOPAMAX) 50 MG tablet 2 (two) times daily.    No  facility-administered encounter medications on file as of 01/10/2021.    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

## 2021-01-11 ENCOUNTER — Ambulatory Visit: Payer: 59 | Admitting: Family Medicine

## 2021-01-16 ENCOUNTER — Telehealth: Payer: Self-pay

## 2021-01-16 NOTE — Progress Notes (Signed)
    Chronic Care Management Pharmacy Assistant   Name: WILBERT HAYASHI  MRN: [id number] DOB: [birth year]  Reason for Encounter: Medication Review /Initial question for initial visit with clinical pharmacist on 01/23/2021. Patient Questions:  1.  Have you seen any other providers since your last visit? No  2.  Any changes in your medicines or health? No    PCP : Delsa Grana, PA-C  Allergies:   Allergies  Allergen Reactions  . No Known Allergies     Medications: Outpatient Encounter Medications as of 01/16/2021  Medication Sig  . aspirin EC 81 MG EC tablet Take 1 tablet (81 mg total) by mouth daily.  . Blood Glucose Monitoring Suppl (D-CARE GLUCOMETER) w/Device KIT 1 Units by Does not apply route 4 (four) times daily -  before meals and at bedtime.  . Dulaglutide (TRULICITY) 1.5 BZ/2.0EY SOPN Inject 1.5 mg into the skin once a week.  . empagliflozin (JARDIANCE) 25 MG TABS tablet Take 1 tablet (25 mg total) by mouth daily before breakfast.  . Galcanezumab-gnlm (EMGALITY) 120 MG/ML SOAJ Inject 120 mg into the skin. 28 days  . insulin detemir (LEVEMIR FLEXTOUCH) 100 UNIT/ML FlexPen Inject 15-30 Units into the skin at bedtime.  . Insulin Pen Needle (BD PEN NEEDLE MICRO U/F) 32G X 6 MM MISC USE FOR LEVEMIR INJECTION DAILY  . lisinopril (ZESTRIL) 10 MG tablet TAKE 1 TABLET BY MOUTH EVERY DAY  . metFORMIN (GLUCOPHAGE) 1000 MG tablet Take 1 tablet (1,000 mg total) by mouth 2 (two) times daily with a meal.  . rosuvastatin (CRESTOR) 40 MG tablet Take 1 tablet (40 mg total) by mouth at bedtime.  . topiramate (TOPAMAX) 50 MG tablet 2 (two) times daily.    No facility-administered encounter medications on file as of 01/16/2021.    Current Diagnosis: Patient Active Problem List   Diagnosis Date Noted  . Chronic intractable headache 02/10/2020  . Diabetes mellitus type 2 in obese ([place]) 11/12/2018  . 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 Obesity (BMI 30.0-34.9) 05/27/2018  . Edema of esophagus    . Angiodysplasia of stomach and duodenum   . Stomach irritation   . Heartburn   . Smoker 05/11/2018  . Hypertension 09/15/2017  . Hx of transient ischemic attack (TIA) 08/05/2017  . Peripheral vascular insufficiency ([place]) 07/06/2017  . Hyperlipidemia associated with type 2 diabetes mellitus ([place]) 10/31/2016    Goals Addressed   None    Unable to leave message  to do initial question prior to patient appointment on 01/16/2021 for CCM  with Junius Argyle the Clinical pharmacist.    Unable to leave message   Follow-Up:  Pharmacist Review   [place] Pharmacist Assistant [phone]

## 2021-01-22 NOTE — Progress Notes (Signed)
 Chronic Care Management Pharmacy Note  01/28/2021 Name:  Donald [name] MRN:  [id number] DOB:  09/16/1966  Subjective: Donald [name] is an 55 y.o. year old male who is a primary patient of Tapia, Leisa, PA-C.  The CCM team was consulted for assistance with disease management and care coordination needs.    Engaged with patient by telephone for initial visit in response to provider referral for pharmacy case management and/or care coordination services.   Consent to Services:  The patient was given the following information about Chronic Care Management services today, agreed to services, and gave verbal consent: 1. CCM service includes personalized support from designated clinical staff supervised by the primary care provider, including individualized plan of care and coordination with other care providers 2. 24/7 contact phone numbers for assistance for urgent and routine care needs. 3. Service will only be billed when office clinical staff spend 20 minutes or more in a month to coordinate care. 4. Only one practitioner may furnish and bill the service in a calendar month. 5.The patient may stop CCM services at any time (effective at the end of the month) by phone call to the office staff. 6. The patient will be responsible for cost sharing (co-pay) of up to 20% of the service fee (after annual deductible is met). Patient agreed to services and consent obtained.  Patient Care Team: Tapia, Leisa, PA-C as PCP - General (Family Medicine) ,  A, RPH (Pharmacist)  Recent office visits: 12/18/2020: Patient presented to Leisa Tapia, PA-C for follow-up. A1c worsened to 13.6%. Levemir increased to 16 units daily   Recent consult visits: 11/21/20: Patient presented to Dr. Shah (Neurology) for follow-up. No medication changes made.   Hospital visits: None in previous 6 months  Objective:  Lab Results  Component Value Date   CREATININE 1.03 12/18/2020   BUN 20 12/18/2020    GFRNONAA 81 12/18/2020   GFRAA 94 12/18/2020   NA 136 12/18/2020   K 4.2 12/18/2020   CALCIUM 9.7 12/18/2020   CO2 27 12/18/2020    Lab Results  Component Value Date/Time   HGBA1C 13.6 (H) 12/18/2020 11:02 AM   HGBA1C 6.9 (H) 07/12/2020 03:39 PM   MICROALBUR 1.3 01/17/2020 10:27 AM   MICROALBUR 38.9 11/15/2018 08:21 AM   MICROALBUR NEG 07/31/2016 04:14 PM    Last diabetic Eye exam: No results found for: HMDIABEYEEXA  Last diabetic Foot exam: No results found for: HMDIABFOOTEX   Lab Results  Component Value Date   CHOL 152 04/11/2020   HDL 44 04/11/2020   LDLCALC 86 04/11/2020   TRIG 123 04/11/2020   CHOLHDL 3.5 04/11/2020    Hepatic Function Latest Ref Rng & Units 12/18/2020 07/12/2020 04/11/2020  Total Protein 6.1 - 8.1 g/dL 7.4 8.1 7.8  Albumin 3.5 - 5.0 g/dL - - -  AST 10 - 35 U/L 15 22 15  ALT 9 - 46 U/L 25 37 18  Alk Phosphatase 38 - 126 U/L - - -  Total Bilirubin 0.2 - 1.2 mg/dL 0.3 0.3 0.3    Lab Results  Component Value Date/Time   TSH 0.73 04/15/2019 08:43 AM   TSH 0.44 03/27/2017 10:50 AM    CBC Latest Ref Rng & Units 12/18/2020 07/12/2020 04/11/2020  WBC 3.8 - 10.8 Thousand/uL 11.0(H) 10.2 12.3(H)  Hemoglobin 13.2 - 17.1 g/dL 13.0(L) 15.2 14.4  Hematocrit 38.5 - 50.0 % 39.3 44.8 42.6  Platelets 140 - 400 Thousand/uL 285 318 324    Lab Results  Component   Value Date/Time   VD25OH 63 07/12/2020 03:39 PM   VD25OH 14 (L) 01/17/2020 10:27 AM    Clinical ASCVD: Yes  The 10-year ASCVD risk score Mikey Bussing DC Jr., et al., 2013) is: 28%   Values used to calculate the score:     Age: 55 years     Sex: Male     Is Non-Hispanic African American: Yes     Diabetic: Yes     Tobacco smoker: Yes     Systolic Blood Pressure: 409 mmHg     Is BP treated: Yes     HDL Cholesterol: 44 mg/dL     Total Cholesterol: 152 mg/dL    Depression screen [facility] 2/9 12/18/2020 07/12/2020 04/11/2020  Decreased Interest 2 0 0  Down, Depressed, Hopeless 2 0 0  PHQ - 2 Score 4 0 0   Altered sleeping 1 0 0  Tired, decreased energy 2 0 0  Change in appetite 0 0 0  Feeling bad or failure about yourself  0 0 0  Trouble concentrating 1 0 0  Moving slowly or fidgety/restless 0 0 0  Suicidal thoughts 0 0 0  PHQ-9 Score 8 0 0  Difficult doing work/chores Somewhat difficult Not difficult at all Not difficult at all  Some recent data might be hidden     Social History   Tobacco Use  Smoking Status Current Every Day Smoker  . Packs/day: 0.50  . Years: 30.00  . Pack years: 15.00  . Types: Cigarettes  Smokeless Tobacco Never Used   BP Readings from Last 3 Encounters:  12/18/20 118/64  07/12/20 122/86  04/11/20 116/72   Pulse Readings from Last 3 Encounters:  12/18/20 74  07/12/20 92  04/11/20 98   Wt Readings from Last 3 Encounters:  12/18/20 214 lb 1.6 oz (97.1 kg)  07/12/20 219 lb 6.4 oz (99.5 kg)  04/11/20 221 lb 12.8 oz (100.6 kg)    Assessment/Interventions: Review of patient past medical history, allergies, medications, health status, including review of consultants reports, laboratory and other test data, was performed as part of comprehensive evaluation and provision of chronic care management services.   SDOH:  (Social Determinants of Health) assessments and interventions performed: Yes   CCM Care Plan  Allergies  Allergen Reactions  . No Known Allergies     Medications Reviewed Today    Reviewed by Germaine Pomfret, [place] (Pharmacist) on 01/28/21 at [id number]  Med List Status: <None>  Medication Order Taking? Sig Documenting Provider Last Dose Status Informant  aspirin EC 81 MG EC tablet [id number] Yes Take 1 tablet (81 mg total) by mouth daily. Hillary Bow, MD Taking Active Self  Blood Glucose Monitoring Suppl (D-CARE GLUCOMETER) w/Device KIT [id number]  1 Units by Does not apply route 4 (four) times daily -  before meals and at bedtime. Hillary Bow, MD  Active Self  Dulaglutide (TRULICITY) 1.5 ON/6.2XB SOPN [id number] Yes Inject 1.5 mg into  the skin once a week. Delsa Grana, PA-C Taking Active   empagliflozin (JARDIANCE) 25 MG TABS tablet [id number] Yes Take 1 tablet (25 mg total) by mouth daily before breakfast. Delsa Grana, PA-C Taking Active   Galcanezumab-gnlm [facility]) 120 MG/ML SOAJ [id number] Yes Inject 120 mg into the skin. 28 days [provider] Taking Active   insulin detemir (LEVEMIR FLEXTOUCH) 100 UNIT/ML FlexPen [id number] Yes Inject 15-30 Units into the skin at bedtime.  Patient taking differently: Inject 20 Units into the skin at bedtime.   Delsa Grana, PA-C Taking Active  Insulin Pen Needle (BD PEN NEEDLE MICRO U/F) 32G X 6 MM MISC [id number]  USE FOR LEVEMIR INJECTION DAILY Delsa Grana, PA-C  Active   lisinopril (ZESTRIL) 10 MG tablet [id number] Yes TAKE 1 TABLET BY MOUTH EVERY DAY Delsa Grana, PA-C Taking Active   metFORMIN (GLUCOPHAGE) 1000 MG tablet [id number] Yes Take 1 tablet (1,000 mg total) by mouth 2 (two) times daily with a meal. Delsa Grana, PA-C Taking Active   rosuvastatin (CRESTOR) 40 MG tablet [id number] Yes Take 1 tablet (40 mg total) by mouth at bedtime. Delsa Grana, PA-C Taking Active   topiramate (TOPAMAX) 50 MG tablet [id number] Yes 150 mg 2 (two) times daily. [provider] Taking Active            Med Note Maryruth Bun Jan 18, 2020 12:20 PM)            Patient Active Problem List   Diagnosis Date Noted  . Chronic intractable headache 02/10/2020  . Diabetes mellitus type 2 in obese ([place]) 11/12/2018  . 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 Obesity (BMI 30.0-34.9) 05/27/2018  . Edema of esophagus   . Angiodysplasia of stomach and duodenum   . Stomach irritation   . Heartburn   . Smoker 05/11/2018  . Hypertension 09/15/2017  . Hx of transient ischemic attack (TIA) 08/05/2017  . Peripheral vascular insufficiency ([place]) 07/06/2017  . Hyperlipidemia associated with type 2 diabetes mellitus ([place]) 10/31/2016    Immunization History  Administered Date(s)  Administered  . Influenza,inj,Quad PF,6+ Mos 10/31/2016, 08/27/2017, 09/29/2018, 09/27/2019  . PFIZER(Purple Top)SARS-COV-2 Vaccination 03/10/2020, 04/04/2020  . Pneumococcal Polysaccharide-23 11/27/2017    Conditions to be addressed/monitored:  Hypertension, Hyperlipidemia, Diabetes, Tobacco use and Chronic Migraines, and History of TIA   Care Plan : General Pharmacy (Adult)  Updates made by Germaine Pomfret, RPH since 01/28/2021 12:00 AM    Problem: Hypertension, Hyperlipidemia, Diabetes, Tobacco use and Chronic Migraines, and History of TIA   Priority: High    Long-Range Goal: Patient-Specific Goal   Start Date: 01/23/2021  Expected End Date: 04/27/2021  This Visit's Progress: On track  Priority: High  Note:   Current Barriers:  . Unable to achieve control of diabetes   Pharmacist Clinical Goal(s):  Marland Kitchen Over the next 90 days, patient will achieve control of diabetes as evidenced by A1c less than 8% through collaboration with PharmD and provider.   Interventions: . 1:1 collaboration with Delsa Grana, PA-C regarding development and update of comprehensive plan of care as evidenced by provider attestation and co-signature . Inter-disciplinary care team collaboration (see longitudinal plan of care) . Comprehensive medication review performed; medication list updated in electronic medical record  Hypertension (BP goal <140/90) -controlled -Current treatment: . Lisinopril 10 mg daily  -Medications previously tried: NA  -Current home readings: NA -Current dietary habits: Patient does not follow any specific dietary regimen -Current exercise habits: Patient does not follow any specific exercise regimen  -Denies hypotensive/hypertensive symptoms -Educated on Daily salt intake goal < 2300 mg; Exercise goal of 150 minutes per week; Importance of home blood pressure monitoring; -Counseled to monitor BP at home weekly, document, and provide log at future appointments -Recommended to  continue current medication  Hyperlipidemia: (LDL goal < 70) -History of TIA -uncontrolled -Current treatment: . Rosuvastatin 40 mg daily  -Medications previously tried: NA  -Educated on Importance of limiting foods high in cholesterol; -Recommended starting Zetia 10 mg daily if repeat LDL is still greater than 70  Diabetes (A1c goal <7%) -  Diagnosed 2019  -uncontrolled -Current medications: . Trulicity 1.5 mg weekly  . Jardiance 25 mg daily  . Levemir 20 units daily  . Metformin 1000 mg twice daily  -Medications previously tried: NA  -Current home glucose readings . fasting glucose: patient does not have recent readings, but reports fasting readings have been much better . post prandial glucose: NA -Denies hypoglycemic/hyperglycemic symptoms -Educated onProper insulin injection technique; Prevention and management of hypoglycemic episodes; Benefits of routine self-monitoring of blood sugar; -Counseled to check feet daily and get yearly eye exams -Recommended to continue current medication  Chronic Migraines (Goal: Prevent migraines) -Managed by Dr. Manuella Ghazi  -controlled -Current treatment  . Emgality 120 mg into the skin every 28 days  . Topiramate 150 mg twice daily  -Medications previously tried: NA  -Recommended to continue current medication  Patient Goals/Self-Care Activities . Over the next 90 days, patient will:  - check glucose daily (before breakfast), document, and provide at future appointments check blood pressure weekly, document, and provide at future appointments  Follow Up Plan: Telephone follow up appointment with care management team member scheduled for: 04/24/2021 at 10:00 AM      Medication Assistance: Application for Jardiance, Levemir, Trulicity  medication assistance program. in process.  Anticipated assistance start date 02/25/2021.  See plan of care for additional detail.  Patient's preferred pharmacy is:  [id number] [place]  [id number]: [phone]: [phone] Uses pill box? Yes Pt endorses 100% compliance  We discussed: Current pharmacy is preferred with insurance plan and patient is satisfied with pharmacy services Patient decided to: Continue current medication management strategy  Care Plan and Follow Up Patient Decision:  Patient agrees to Care Plan and Follow-up.  Plan: Telephone follow up appointment with care management team member scheduled for:  04/24/2021 at 10:00 AM   [phone]

## 2021-01-23 ENCOUNTER — Ambulatory Visit (INDEPENDENT_AMBULATORY_CARE_PROVIDER_SITE_OTHER): Payer: 59

## 2021-01-23 DIAGNOSIS — E1165 Type 2 diabetes mellitus with hyperglycemia: Secondary | ICD-10-CM

## 2021-01-23 DIAGNOSIS — E1169 Type 2 diabetes mellitus with other specified complication: Secondary | ICD-10-CM | POA: Diagnosis not present

## 2021-01-23 DIAGNOSIS — E785 Hyperlipidemia, unspecified: Secondary | ICD-10-CM | POA: Diagnosis not present

## 2021-01-23 DIAGNOSIS — E1159 Type 2 diabetes mellitus with other circulatory complications: Secondary | ICD-10-CM

## 2021-01-23 DIAGNOSIS — Z794 Long term (current) use of insulin: Secondary | ICD-10-CM

## 2021-01-23 DIAGNOSIS — I152 Hypertension secondary to endocrine disorders: Secondary | ICD-10-CM

## 2021-01-23 DIAGNOSIS — IMO0002 Reserved for concepts with insufficient information to code with codable children: Secondary | ICD-10-CM

## 2021-01-28 NOTE — Patient Instructions (Signed)
Visit Information It was great speaking with you today!  Please let me know if you have any questions about our visit.  Goals Addressed            This Visit's Progress   . Monitor and Manage My Blood Sugar-Diabetes Type 2       Timeframe:  Long-Range Goal Priority:  High Start Date:  01/23/2021                           Expected End Date: 04/24/2021                      Follow Up Date 03/30/2021    - check blood sugar once daily before breakfast - check blood sugar if I feel it is too high or too low - enter blood sugar readings and medication or insulin into daily log    Why is this important?    Checking your blood sugar at home helps to keep it from getting very high or very low.   Writing the results in a diary or log helps the doctor know how to care for you.   Your blood sugar log should have the time, date and the results.   Also, write down the amount of insulin or other medicine that you take.   Other information, like what you ate, exercise done and how you were feeling, will also be helpful.     Notes:        Patient Care Plan: General Pharmacy (Adult)    Problem Identified: Hypertension, Hyperlipidemia, Diabetes, Tobacco use and Chronic Migraines, and History of TIA   Priority: High    Long-Range Goal: Patient-Specific Goal   Start Date: 01/23/2021  Expected End Date: 04/27/2021  This Visit's Progress: On track  Priority: High  Note:   Current Barriers:  . Unable to achieve control of diabetes   Pharmacist Clinical Goal(s):  Marland Kitchen Over the next 90 days, patient will achieve control of diabetes as evidenced by A1c less than 8% through collaboration with PharmD and provider.   Interventions: . 1:1 collaboration with Danelle Berry, PA-C regarding development and update of comprehensive plan of care as evidenced by provider attestation and co-signature . Inter-disciplinary care team collaboration (see longitudinal plan of care) . Comprehensive medication  review performed; medication list updated in electronic medical record  Hypertension (BP goal <140/90) -controlled -Current treatment: . Lisinopril 10 mg daily  -Medications previously tried: NA  -Current home readings: NA -Current dietary habits: Patient does not follow any specific dietary regimen -Current exercise habits: Patient does not follow any specific exercise regimen  -Denies hypotensive/hypertensive symptoms -Educated on Daily salt intake goal < 2300 mg; Exercise goal of 150 minutes per week; Importance of home blood pressure monitoring; -Counseled to monitor BP at home weekly, document, and provide log at future appointments -Recommended to continue current medication  Hyperlipidemia: (LDL goal < 70) -History of TIA -uncontrolled -Current treatment: . Rosuvastatin 40 mg daily  -Medications previously tried: NA  -Educated on Importance of limiting foods high in cholesterol; -Recommended starting Zetia 10 mg daily if repeat LDL is still greater than 70  Diabetes (A1c goal <7%) -Diagnosed 2019  -uncontrolled -Current medications: . Trulicity 1.5 mg weekly  . Jardiance 25 mg daily  . Levemir 20 units daily  . Metformin 1000 mg twice daily  -Medications previously tried: NA  -Current home glucose readings . fasting glucose: patient does not have  recent readings, but reports fasting readings have been much better . post prandial glucose: NA -Denies hypoglycemic/hyperglycemic symptoms -Educated onProper insulin injection technique; Prevention and management of hypoglycemic episodes; Benefits of routine self-monitoring of blood sugar; -Counseled to check feet daily and get yearly eye exams -Recommended to continue current medication  Chronic Migraines (Goal: Prevent migraines) -Managed by Dr. Sherryll Burger  -controlled -Current treatment  . Emgality 120 mg into the skin every 28 days  . Topiramate 150 mg twice daily  -Medications previously tried: NA  -Recommended to  continue current medication  Patient Goals/Self-Care Activities . Over the next 90 days, patient will:  - check glucose daily (before breakfast), document, and provide at future appointments check blood pressure weekly, document, and provide at future appointments  Follow Up Plan: Telephone follow up appointment with care management team member scheduled for: 04/24/2021 at 10:00 AM    Donald [name] was given information about Chronic Care Management services today including:  1. CCM service includes personalized support from designated clinical staff supervised by his physician, including individualized plan of care and coordination with other care providers 2. 24/7 contact phone numbers for assistance for urgent and routine care needs. 3. Standard insurance, coinsurance, copays and deductibles apply for chronic care management only during months in which we provide at least 20 minutes of these services. Most insurances cover these services at 100%, however patients may be responsible for any copay, coinsurance and/or deductible if applicable. This service may help you avoid the need for more expensive face-to-face services. 4. Only one practitioner may furnish and bill the service in a calendar month. 5. The patient may stop CCM services at any time (effective at the end of the month) by phone call to the office staff.  Patient agreed to services and verbal consent obtained.   The patient verbalized understanding of instructions, educational materials, and care plan provided today and declined offer to receive copy of patient instructions, educational materials, and care plan.   Garey Ham Clinical Pharmacist [facility] [phone]

## 2021-01-30 ENCOUNTER — Telehealth: Payer: Self-pay

## 2021-01-30 NOTE — Chronic Care Management (AMB) (Signed)
01/30/2021- Trulicity Patient assistance forms filled out for [employer] Patient [facility]. Patient notified that forms will be placed in the mail and highlighted information for him to sign and to please bring back to PCP office for Angelena Sole to fax. Angelena Sole, CPP notified.   Billee Cashing, CMA Clinical Pharmacist Assistant [phone]

## 2021-02-27 ENCOUNTER — Telehealth: Payer: Self-pay

## 2021-02-27 NOTE — Progress Notes (Signed)
    Chronic Care Management Pharmacy Assistant   Name: Donald [name]  MRN: [id number] DOB: [birth year]  Reason for Encounter: Medication Review     Medications: Outpatient Encounter Medications as of 02/27/2021  Medication Sig  . aspirin EC 81 MG EC tablet Take 1 tablet (81 mg total) by mouth daily.  . Blood Glucose Monitoring Suppl (D-CARE GLUCOMETER) w/Device KIT 1 Units by Does not apply route 4 (four) times daily -  before meals and at bedtime.  . Dulaglutide (TRULICITY) 1.5 OE/3.2ZY SOPN Inject 1.5 mg into the skin once a week.  . empagliflozin (JARDIANCE) 25 MG TABS tablet Take 1 tablet (25 mg total) by mouth daily before breakfast.  . Galcanezumab-gnlm (EMGALITY) 120 MG/ML SOAJ Inject 120 mg into the skin. 28 days  . insulin detemir (LEVEMIR FLEXTOUCH) 100 UNIT/ML FlexPen Inject 15-30 Units into the skin at bedtime. (Patient taking differently: Inject 20 Units into the skin at bedtime.)  . Insulin Pen Needle (BD PEN NEEDLE MICRO U/F) 32G X 6 MM MISC USE FOR LEVEMIR INJECTION DAILY  . lisinopril (ZESTRIL) 10 MG tablet TAKE 1 TABLET BY MOUTH EVERY DAY  . metFORMIN (GLUCOPHAGE) 1000 MG tablet Take 1 tablet (1,000 mg total) by mouth 2 (two) times daily with a meal.  . rosuvastatin (CRESTOR) 40 MG tablet Take 1 tablet (40 mg total) by mouth at bedtime.  . topiramate (TOPAMAX) 50 MG tablet 150 mg 2 (two) times daily.   No facility-administered encounter medications on file as of 02/27/2021.    Reviewed chart and adherence measures. Per insurance data, Patient is up to date with his care gaps.

## 2021-03-12 ENCOUNTER — Telehealth: Payer: Self-pay

## 2021-03-12 NOTE — Progress Notes (Signed)
Chronic Care Management Pharmacy Assistant   Name: Donald [name]  MRN: [id number] DOB: 01-Jan-1966  Reason for Encounter:Diabetes Disease State Call.  Recent office visits:  No recent Office Visit  Recent consult visits:  No recent consult visit  Hospital visits:  None in previous 6 months  Medications: Outpatient Encounter Medications as of 03/12/2021  Medication Sig  . aspirin EC 81 MG EC tablet Take 1 tablet (81 mg total) by mouth daily.  . Blood Glucose Monitoring Suppl (D-CARE GLUCOMETER) w/Device KIT 1 Units by Does not apply route 4 (four) times daily -  before meals and at bedtime.  . Dulaglutide (TRULICITY) 1.5 MB/8.6LJ SOPN Inject 1.5 mg into the skin once a week.  . empagliflozin (JARDIANCE) 25 MG TABS tablet Take 1 tablet (25 mg total) by mouth daily before breakfast.  . Galcanezumab-gnlm (EMGALITY) 120 MG/ML SOAJ Inject 120 mg into the skin. 28 days  . insulin detemir (LEVEMIR FLEXTOUCH) 100 UNIT/ML FlexPen Inject 15-30 Units into the skin at bedtime. (Patient taking differently: Inject 20 Units into the skin at bedtime.)  . Insulin Pen Needle (BD PEN NEEDLE MICRO U/F) 32G X 6 MM MISC USE FOR LEVEMIR INJECTION DAILY  . lisinopril (ZESTRIL) 10 MG tablet TAKE 1 TABLET BY MOUTH EVERY DAY  . metFORMIN (GLUCOPHAGE) 1000 MG tablet Take 1 tablet (1,000 mg total) by mouth 2 (two) times daily with a meal.  . rosuvastatin (CRESTOR) 40 MG tablet Take 1 tablet (40 mg total) by mouth at bedtime.  . topiramate (TOPAMAX) 50 MG tablet 150 mg 2 (two) times daily.   No facility-administered encounter medications on file as of 03/12/2021.   Star Rating Drugs: Trulicity 4.4BE last filled on 12/18/2020 for 84 day supply at [facility]. Jardiance 25 mg last filled on 02/10/2020 for [facility] day supply at [facility]. Lisinopril 10 mg last filled on 12/26/2020 for [facility] day supply at [facility] Metformin 1000 mg last filled on 12/18/2020 for 90 day supply at [facility]. Rosuvastatin 40 mg  last filled on 12/24/2020 for 90 day supply.  Recent Relevant Labs: Lab Results  Component Value Date/Time   HGBA1C 13.6 (H) 12/18/2020 11:02 AM   HGBA1C 6.9 (H) 07/12/2020 03:39 PM   MICROALBUR 1.3 01/17/2020 10:27 AM   MICROALBUR 38.9 11/15/2018 08:21 AM   MICROALBUR NEG 07/31/2016 04:14 PM    Kidney Function Lab Results  Component Value Date/Time   CREATININE 1.03 12/18/2020 11:02 AM   CREATININE 1.14 07/12/2020 03:39 PM   GFRNONAA 81 12/18/2020 11:02 AM   GFRAA 94 12/18/2020 11:02 AM    . Current antihyperglycemic regimen:   Trulicity 1.5 mg weekly   Levemir 20 units daily   Metformin 1000 mg twice daily  . What recent interventions/DTPs have been made to improve glycemic control:  o Patient states he does not take Jardiance 25 mg daily anymore. . Have there been any recent hospitalizations or ED visits since last visit with CPP? No . Patient denies hypoglycemic symptoms, including Pale, Sweaty, Shaky, Hungry, Nervous/irritable and Vision changes . Patient denies hyperglycemic symptoms, including blurry vision, excessive thirst, fatigue, polyuria and weakness . How often are you checking your blood sugar? o Patient states he checks his blood sugar every other day. . What are your blood sugars ranging?  o Fasting: n/a o Before meals:  - Patient states his blood sugar has been ranging around 130,125. o After meals: n/a o Bedtime: n/a . During the week, how often does your blood glucose drop below 70? Never .  Are you checking your feet daily/regularly?   Patient denies numbness,pain, or tingling sensations in his feet.  Adherence Review: Is the patient currently on a STATIN medication? Yes Is the patient currently on ACE/ARB medication? Yes Does the patient have >5 day gap between last estimated fill dates? No  Anderson Malta Clinical Production designer, theatre/television/film [phone]

## 2021-03-26 ENCOUNTER — Other Ambulatory Visit: Payer: Self-pay

## 2021-03-26 ENCOUNTER — Encounter: Payer: Self-pay | Admitting: Family Medicine

## 2021-03-26 ENCOUNTER — Ambulatory Visit (INDEPENDENT_AMBULATORY_CARE_PROVIDER_SITE_OTHER): Payer: 59 | Admitting: Family Medicine

## 2021-03-26 VITALS — BP 118/74 | HR 100 | Temp 98.8°F | Resp 18 | Ht 71.0 in | Wt 217.5 lb

## 2021-03-26 DIAGNOSIS — Z794 Long term (current) use of insulin: Secondary | ICD-10-CM

## 2021-03-26 DIAGNOSIS — E1169 Type 2 diabetes mellitus with other specified complication: Secondary | ICD-10-CM | POA: Diagnosis not present

## 2021-03-26 DIAGNOSIS — G8929 Other chronic pain: Secondary | ICD-10-CM

## 2021-03-26 DIAGNOSIS — D649 Anemia, unspecified: Secondary | ICD-10-CM

## 2021-03-26 DIAGNOSIS — I10 Essential (primary) hypertension: Secondary | ICD-10-CM

## 2021-03-26 DIAGNOSIS — E785 Hyperlipidemia, unspecified: Secondary | ICD-10-CM

## 2021-03-26 DIAGNOSIS — H4710 Unspecified papilledema: Secondary | ICD-10-CM

## 2021-03-26 DIAGNOSIS — E1165 Type 2 diabetes mellitus with hyperglycemia: Secondary | ICD-10-CM

## 2021-03-26 DIAGNOSIS — Z5181 Encounter for therapeutic drug level monitoring: Secondary | ICD-10-CM | POA: Diagnosis not present

## 2021-03-26 DIAGNOSIS — R519 Headache, unspecified: Secondary | ICD-10-CM

## 2021-03-26 DIAGNOSIS — IMO0002 Reserved for concepts with insufficient information to code with codable children: Secondary | ICD-10-CM

## 2021-03-26 NOTE — Progress Notes (Signed)
Name: Donald [name]   MRN: [id number]    DOB: 25-Apr-1966   Date:03/26/2021       Progress Note  Chief Complaint  Patient presents with  . Follow-up  . Diabetes  . Hyperlipidemia  . Hypertension     Subjective:   Donald [name] is a 55 y.o. male, presents to clinic for routine follow-up  DM:   Pt managing DM with insulin 20 units, trulicity, metformin, jardiance Reports good med compliance Pt has no  SE from meds. Blood sugars 120's Denies: Polyuria, polydipsia, vision changes, neuropathy, hypoglycemia Recent pertinent labs: Lab Results  Component Value Date   HGBA1C 13.6 (H) 12/18/2020   HGBA1C 6.9 (H) 07/12/2020   HGBA1C 6.8 (H) 04/11/2020   Standard of care and health maintenance: Foot exam: Due today DM eye exam: Up-to-date ACEI/ARB: Yes lisinopril Statin: Yes on Crestor  HLD on Crestor 40 mg, good tolerance and compliance, no myalgias side effects or concerns he denies any claudication, chest pain, shortness of breath  HTN-blood pressure and hypertension managed with lisinopril 10 mg, good compliance BP Readings from Last 3 Encounters:  03/26/21 118/74  12/18/20 118/64  07/12/20 122/86   Continued headaches-continue to work with neurology Emgality shots improved his severity of headaches and frequency somewhat but now he has more severe headaches that return prior to when he is due for the next dose, he would still like to see if neurology can check for intracranial pressure and pseudotumor cerebri/IIH he is continue to see the eye specialist and is due to follow-up with them soon    Current Outpatient Medications:  .  aspirin EC 81 MG EC tablet, Take 1 tablet (81 mg total) by mouth daily., Disp: , Rfl:  .  Blood Glucose Monitoring Suppl (D-CARE GLUCOMETER) w/Device KIT, 1 Units by Does not apply route 4 (four) times daily -  before meals and at bedtime., Disp: 1 kit, Rfl: 0 .  Dulaglutide (TRULICITY) 1.5 YF/7.4BS SOPN, Inject 1.5 mg into the skin once a  week., Disp: 6 mL, Rfl: 3 .  Galcanezumab-gnlm (EMGALITY) 120 MG/ML SOAJ, Inject 120 mg into the skin. 28 days, Disp: , Rfl:  .  insulin detemir (LEVEMIR FLEXTOUCH) 100 UNIT/ML FlexPen, Inject 15-30 Units into the skin at bedtime. (Patient taking differently: Inject 20 Units into the skin at bedtime.), Disp: 15 mL, Rfl: 3 .  Insulin Pen Needle (BD PEN NEEDLE MICRO U/F) 32G X 6 MM MISC, USE FOR LEVEMIR INJECTION DAILY, Disp: 100 each, Rfl: 5 .  lisinopril (ZESTRIL) 10 MG tablet, TAKE 1 TABLET BY MOUTH EVERY DAY, Disp: 90 tablet, Rfl: 3 .  metFORMIN (GLUCOPHAGE) 1000 MG tablet, Take 1 tablet (1,000 mg total) by mouth 2 (two) times daily with a meal., Disp: 180 tablet, Rfl: 3 .  rosuvastatin (CRESTOR) 40 MG tablet, Take 1 tablet (40 mg total) by mouth at bedtime., Disp: 90 tablet, Rfl: 3 .  topiramate (TOPAMAX) 50 MG tablet, 150 mg 2 (two) times daily., Disp: , Rfl:  .  empagliflozin (JARDIANCE) 25 MG TABS tablet, Take 1 tablet (25 mg total) by mouth daily before breakfast. (Patient not taking: Reported on 03/26/2021), Disp: 90 tablet, Rfl: 3  Patient Active Problem List   Diagnosis Date Noted  . Chronic intractable headache 02/10/2020  . Diabetes mellitus type 2 in obese ([place]) 11/12/2018  . 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 Obesity (BMI 30.0-34.9) 05/27/2018  . Edema of esophagus   . Angiodysplasia of stomach and duodenum   . Stomach  irritation   . Heartburn   . Smoker 05/11/2018  . Hypertension 09/15/2017  . Hx of transient ischemic attack (TIA) 08/05/2017  . Peripheral vascular insufficiency ([place]) 07/06/2017  . Hyperlipidemia associated with type 2 diabetes mellitus ([place]) 10/31/2016    Past Surgical History:  Procedure Laterality Date  . ESOPHAGOGASTRODUODENOSCOPY (EGD) WITH PROPOFOL N/A 05/26/2018   Procedure: ESOPHAGOGASTRODUODENOSCOPY (EGD) WITH PROPOFOL;  Surgeon: Virgel Manifold, MD;  Location: [facility] ENDOSCOPY;  Service: Endoscopy;  Laterality: N/A;  . KNEE SURGERY Right      Family History  Problem Relation Age of Onset  . Hypertension Father   . Diabetes Father   . Liver disease Father   . Diverticulitis Mother   . Migraines Mother   . Cancer Sister        tumors in the breast   . Diabetes Maternal Grandfather   . Cancer Sister        tumors in the breast   . Cancer Sister        tumor in the breast     Social History   Tobacco Use  . Smoking status: Current Every Day Smoker    Packs/day: 0.50    Years: 30.00    Pack years: 15.00    Types: Cigarettes  . Smokeless tobacco: Never Used  Vaping Use  . Vaping Use: Never used  Substance Use Topics  . Alcohol use: Yes    Alcohol/week: 0.0 standard drinks    Comment: none in the last 3 weeks  . Drug use: No     Allergies  Allergen Reactions  . No Known Allergies     Health Maintenance  Topic Date Due  . OPHTHALMOLOGY EXAM  08/11/2020  . COVID-19 Vaccine (3 - Booster for Pfizer series) 10/05/2020  . FOOT EXAM  01/16/2021  . HEMOGLOBIN A1C  06/17/2021  . INFLUENZA VACCINE  07/01/2021  . TETANUS/TDAP  12/01/2021  . COLONOSCOPY (Pts 45-48yr Insurance coverage will need to be confirmed)  12/01/2024  . PNEUMOCOCCAL POLYSACCHARIDE VACCINE AGE 39-64 HIGH RISK  Completed  . Hepatitis C Screening  Completed  . HIV Screening  Completed  . HPV VACCINES  Aged Out    Chart Review Today: I personally reviewed active problem list, medication list, allergies, family history, social history, health maintenance, notes from last encounter, lab results, imaging with the patient/caregiver today.   Review of Systems  Constitutional: Negative.   HENT: Negative.   Eyes: Negative.   Respiratory: Negative.   Cardiovascular: Negative.   Gastrointestinal: Negative.   Endocrine: Negative.   Genitourinary: Negative.   Musculoskeletal: Negative.   Skin: Negative.   Allergic/Immunologic: Negative.   Neurological: Negative.   Hematological: Negative.   Psychiatric/Behavioral: Negative.   All other  systems reviewed and are negative.    Objective:   Vitals:   03/26/21 1057  BP: 118/74  Pulse: 100  Resp: 18  Temp: 98.8 F (37.1 C)  SpO2: 99%  Weight: 217 lb 8 oz (98.7 kg)  Height: 5' 11" (1.803 m)    Body mass index is 30.34 kg/m.  Physical Exam Vitals and nursing note reviewed.  Constitutional:      General: He is not in acute distress.    Appearance: Normal appearance. He is well-developed. He is not ill-appearing, toxic-appearing or diaphoretic.     Interventions: Face mask in place.  HENT:     Head: Normocephalic and atraumatic.     Jaw: No trismus.     Right Ear: External ear normal.  Left Ear: External ear normal.  Eyes:     General: Lids are normal. No scleral icterus.       Right eye: No discharge.        Left eye: No discharge.     Conjunctiva/sclera: Conjunctivae normal.  Neck:     Trachea: Trachea and phonation normal. No tracheal deviation.  Cardiovascular:     Rate and Rhythm: Normal rate and regular rhythm.     Pulses: Normal pulses.          Radial pulses are 2+ on the right side and 2+ on the left side.       Posterior tibial pulses are 2+ on the right side and 2+ on the left side.     Heart sounds: Normal heart sounds. No murmur heard. No friction rub. No gallop.   Pulmonary:     Effort: Pulmonary effort is normal. No respiratory distress.     Breath sounds: Normal breath sounds. No stridor. No wheezing, rhonchi or rales.  Abdominal:     General: Bowel sounds are normal. There is no distension.     Palpations: Abdomen is soft.  Musculoskeletal:     Right lower leg: No edema.     Left lower leg: No edema.  Skin:    General: Skin is warm and dry.     Coloration: Skin is not jaundiced.     Findings: No rash.     Nails: There is no clubbing.  Neurological:     Mental Status: He is alert. Mental status is at baseline.     Cranial Nerves: No dysarthria or facial asymmetry.     Motor: No tremor or abnormal muscle tone.     Gait: Gait  normal.  Psychiatric:        Mood and Affect: Mood normal.        Speech: Speech normal.        Behavior: Behavior normal. Behavior is cooperative.     Diabetic Foot Exam - Simple   Simple Foot Form Diabetic Foot exam was performed with the following findings: Yes 03/26/2021 11:30 AM  Visual Inspection No deformities, no ulcerations, no other skin breakdown bilaterally: Yes Sensation Testing Intact to touch and monofilament testing bilaterally: Yes Pulse Check Posterior Tibialis and Dorsalis pulse intact bilaterally: Yes Comments        Assessment & Plan:     ICD-10-CM   1. Uncontrolled insulin-treated type 2 diabetes mellitus (HCC)  E11.65 Hemoglobin A1C   N82.9 COMPLETE METABOLIC PANEL WITH GFR    Lipid panel    Ambulatory referral to Ophthalmology   uncontrolled, due for labs, better lifestyle effort, sugars sound slightly better controlled than last OV  2. Hyperlipidemia associated with type 2 diabetes mellitus (HCC)  F62.13 COMPLETE METABOLIC PANEL WITH GFR   E78.5 Lipid panel   compliant with statin, no myalgias, due for labs  3. Essential hypertension  Y86 COMPLETE METABOLIC PANEL WITH GFR   Stable, well-controlled, blood pressure at goal today, continue lisinopril  4. Encounter for medication monitoring  Z51.81 Hemoglobin A1C    COMPLETE METABOLIC PANEL WITH GFR    Lipid panel    CBC with Differential/Platelet  5. Anemia, unspecified type  D64.9 CBC with Differential/Platelet   Recheck, currently asymptomatic  6. Chronic intractable headache, unspecified headache type  R51.9 Ambulatory referral to Ophthalmology   G89.29    Managed by neurology, not really improving with Emgality, sleep study done, patient may want a second opinion, still concern for  idiopathic intracranial HTN  7. 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   Delsa Grana, PA-C 03/26/21 11:22 AM

## 2021-03-27 LAB — COMPLETE METABOLIC PANEL WITH GFR
AG Ratio: 1.4 (calc) (ref 1.0–2.5)
ALT: 18 U/L (ref 9–46)
AST: 14 U/L (ref 10–35)
Albumin: 4.5 g/dL (ref 3.6–5.1)
Alkaline phosphatase (APISO): 67 U/L (ref 35–144)
BUN/Creatinine Ratio: 21 (calc) (ref 6–22)
BUN: 26 mg/dL — ABNORMAL HIGH (ref 7–25)
CO2: 26 mmol/L (ref 20–32)
Calcium: 9.9 mg/dL (ref 8.6–10.3)
Chloride: 105 mmol/L (ref 98–110)
Creat: 1.23 mg/dL (ref 0.70–1.33)
GFR, Est African American: 76 mL/min/{1.73_m2} (ref >=60)
GFR, Est Non African American: 65 mL/min/{1.73_m2} (ref >=60)
Globulin: 3.3 g/dL (calc) (ref 1.9–3.7)
Glucose, Bld: 111 mg/dL — ABNORMAL HIGH (ref 65–99)
Potassium: 4.9 mmol/L (ref 3.5–5.3)
Sodium: 138 mmol/L (ref 135–146)
Total Bilirubin: 0.2 mg/dL (ref 0.2–1.2)
Total Protein: 7.8 g/dL (ref 6.1–8.1)

## 2021-03-27 LAB — LIPID PANEL
Cholesterol: 109 mg/dL (ref <200)
HDL: 38 mg/dL — ABNORMAL LOW (ref >=40)
LDL Cholesterol (Calc): 54 mg/dL (calc)
Non-HDL Cholesterol (Calc): 71 mg/dL (calc) (ref <130)
Total CHOL/HDL Ratio: 2.9 (calc) (ref <5.0)
Triglycerides: 83 mg/dL (ref <150)

## 2021-03-27 LAB — CBC WITH DIFFERENTIAL/PLATELET
Absolute Monocytes: 676 cells/uL (ref 200–950)
Basophils Absolute: 69 cells/uL (ref 0–200)
Basophils Relative: 0.7 %
Eosinophils Absolute: 176 cells/uL (ref 15–500)
Eosinophils Relative: 1.8 %
HCT: 39.3 % (ref 38.5–50.0)
Hemoglobin: 12.9 g/dL — ABNORMAL LOW (ref 13.2–17.1)
Lymphs Abs: 3626 cells/uL (ref 850–3900)
MCH: 31 pg (ref 27.0–33.0)
MCHC: 32.8 g/dL (ref 32.0–36.0)
MCV: 94.5 fL (ref 80.0–100.0)
MPV: 11.6 fL (ref 7.5–12.5)
Monocytes Relative: 6.9 %
Neutro Abs: 5253 cells/uL (ref 1500–7800)
Neutrophils Relative %: 53.6 %
Platelets: 290 10*3/uL (ref 140–400)
RBC: 4.16 10*6/uL — ABNORMAL LOW (ref 4.20–5.80)
RDW: 12.4 % (ref 11.0–15.0)
Total Lymphocyte: 37 %
WBC: 9.8 10*3/uL (ref 3.8–10.8)

## 2021-03-27 LAB — HEMOGLOBIN A1C
Hgb A1c MFr Bld: 7.9 % of total Hgb — ABNORMAL HIGH (ref <5.7)
Mean Plasma Glucose: 180 mg/dL
eAG (mmol/L): 10 mmol/L

## 2021-03-28 ENCOUNTER — Encounter: Payer: Self-pay | Admitting: Family Medicine

## 2021-03-28 DIAGNOSIS — R55 Syncope and collapse: Secondary | ICD-10-CM | POA: Insufficient documentation

## 2021-03-29 ENCOUNTER — Other Ambulatory Visit: Payer: Self-pay | Admitting: Neurology

## 2021-03-29 ENCOUNTER — Telehealth: Payer: Self-pay

## 2021-03-29 DIAGNOSIS — R55 Syncope and collapse: Secondary | ICD-10-CM

## 2021-03-29 NOTE — Telephone Encounter (Signed)
Pt. Given results and instructions, verbalizes understanding. ?

## 2021-04-04 LAB — HM DIABETES EYE EXAM

## 2021-04-05 ENCOUNTER — Other Ambulatory Visit: Payer: Self-pay | Admitting: Neurology

## 2021-04-05 DIAGNOSIS — G43719 Chronic migraine without aura, intractable, without status migrainosus: Secondary | ICD-10-CM

## 2021-04-06 ENCOUNTER — Other Ambulatory Visit: Payer: Self-pay

## 2021-04-06 ENCOUNTER — Ambulatory Visit
Admission: RE | Admit: 2021-04-06 | Discharge: 2021-04-06 | Disposition: A | Discharge status: Home / Self Care | Payer: 59 | Source: Ambulatory Visit | Attending: Neurology | Admitting: Neurology

## 2021-04-06 DIAGNOSIS — R55 Syncope and collapse: Secondary | ICD-10-CM | POA: Diagnosis present

## 2021-04-06 MED ORDER — GADOBUTROL 1 MMOL/ML IV SOLN
10.0000 mL | Freq: Once | INTRAVENOUS | Status: AC | PRN
  Administered 2021-04-06: 10 mL via INTRAVENOUS

## 2021-04-16 ENCOUNTER — Telehealth: Payer: Self-pay

## 2021-04-16 NOTE — Telephone Encounter (Signed)
Patient called this afternoon and left voice message to call back.  I called the patient back and he requested to change the scheduled procedure appointment from this Friday 04/19/2021 to next Friday 04/26/2021.  I had him repeat the request and he did and verbalized again wanting to change date from "this Friday to next Friday".  I repeated his request and he said "yes".  I let him know someone would be contacting him and his wife, Meriam Sprague, with procedure information and he said "OK" and ended call.  I spoke to [place] and appointment for procedure has been changed from Friday 04/19/2021 to Friday 04/26/2021.

## 2021-04-16 NOTE — Telephone Encounter (Signed)
I called patient today to update on time of arrival for procedure scheduled this Friday, 04/19/2021.  Patient states he saw his provider on 04/11/2021 and did not remember planning this procedure.  Patient states he wants more information and at this time states he is not coming for his procedure.  The information sent from the ordering provider office indicates that patient cannot sign own consent so I contacted patient's wife, Donald [name], at [phone].  [name] states she did not attend appointment with patient on 04/11/2021 and asked me to tell her information from that appointment.  I let her know she needed to contact the ordering provider's office, [facility], and ordering provider Harlin Rain, [place], at [phone], to obtain that information.  I let her know I was contacting her to update her on the date/time of the procedure on Friday 04/19/2021.  I also asked her if patient could sign own consent and she first indicated yes then said she was not sure.  I let her know that if they move forward with the procedure on Friday 04/19/2021, she would need to accompany him.  She states she will contact the ordering provider and obtain the information and then let us know by tomorrow, Wednesday afternoon 04/17/2021, if they are going to come for procedure on Friday 04/19/2021.  I provider her with the IR Nurse phone number of [phone].

## 2021-04-19 ENCOUNTER — Encounter: Payer: Self-pay | Admitting: Family Medicine

## 2021-04-19 ENCOUNTER — Ambulatory Visit: Admission: RE | Admit: 2021-04-19 | Payer: 59 | Source: Ambulatory Visit

## 2021-04-23 ENCOUNTER — Telehealth: Payer: Self-pay

## 2021-04-23 NOTE — Progress Notes (Signed)
Patient on schedule for LP 04/26/2021, called and spoke with patient on phone, made aware to be here @ 0800, driver post procedure/discharge, and will hold aspirin after 5/24 am dose. Stated understanding.

## 2021-04-23 NOTE — Progress Notes (Signed)
Left Voice message  to confirmed patient telephone appointment on 04/24/2021 for CCM at 10:00 am with Angelena Sole the Clinical pharmacist.  Left message to have all medications, supplements, blood pressure and/or blood sugar logs available during appointment and to return call if need to reschedule.   Everlean Cherry Clinical Pharmacist Assistant [phone]

## 2021-04-24 ENCOUNTER — Ambulatory Visit: Payer: 59

## 2021-04-24 DIAGNOSIS — I152 Hypertension secondary to endocrine disorders: Secondary | ICD-10-CM

## 2021-04-24 DIAGNOSIS — Z794 Long term (current) use of insulin: Secondary | ICD-10-CM

## 2021-04-24 DIAGNOSIS — E1159 Type 2 diabetes mellitus with other circulatory complications: Secondary | ICD-10-CM | POA: Diagnosis not present

## 2021-04-24 DIAGNOSIS — E785 Hyperlipidemia, unspecified: Secondary | ICD-10-CM

## 2021-04-24 DIAGNOSIS — G8929 Other chronic pain: Secondary | ICD-10-CM

## 2021-04-24 DIAGNOSIS — E1169 Type 2 diabetes mellitus with other specified complication: Secondary | ICD-10-CM | POA: Diagnosis not present

## 2021-04-24 DIAGNOSIS — E119 Type 2 diabetes mellitus without complications: Secondary | ICD-10-CM | POA: Diagnosis not present

## 2021-04-24 DIAGNOSIS — R519 Headache, unspecified: Secondary | ICD-10-CM

## 2021-04-24 NOTE — Patient Instructions (Signed)
Visit Information It was great speaking with you today!  Please let me know if you have any questions about our visit.  Goals Addressed            This Visit's Progress   . Monitor and Manage My Blood Sugar-Diabetes Type 2       Timeframe:  Long-Range Goal Priority:  High Start Date:  01/23/2021                           Expected End Date: 10/25/2021                      Follow Up Date 05/30/2021    - check blood sugar once daily before breakfast - check blood sugar if I feel it is too high or too low - enter blood sugar readings and medication or insulin into daily log    Why is this important?    Checking your blood sugar at home helps to keep it from getting very high or very low.   Writing the results in a diary or log helps the doctor know how to care for you.   Your blood sugar log should have the time, date and the results.   Also, write down the amount of insulin or other medicine that you take.   Other information, like what you ate, exercise done and how you were feeling, will also be helpful.     Notes:        Patient Care Plan: General Pharmacy (Adult)    Problem Identified: Hypertension, Hyperlipidemia, Diabetes, Tobacco use and Chronic Migraines, and History of TIA   Priority: High    Long-Range Goal: Patient-Specific Goal   Start Date: 01/23/2021  Expected End Date: 10/25/2021  This Visit's Progress: On track  Recent Progress: On track  Priority: High  Note:   Current Barriers:  . Unable to achieve control of diabetes   Pharmacist Clinical Goal(s):  Marland Kitchen Over the next 90 days, patient will achieve control of diabetes as evidenced by A1c less than 8% through collaboration with PharmD and provider.   Interventions: . 1:1 collaboration with Danelle Berry, PA-C regarding development and update of comprehensive plan of care as evidenced by provider attestation and co-signature . Inter-disciplinary care team collaboration (see longitudinal plan of  care) . Comprehensive medication review performed; medication list updated in electronic medical record  Hypertension (BP goal <140/90) -controlled -Current treatment: . Lisinopril 10 mg daily  -Medications previously tried: NA  -Current home readings: NA -Current dietary habits: Patient does not follow any specific dietary regimen -Current exercise habits: Patient does not follow any specific exercise regimen  -Denies hypotensive/hypertensive symptoms -Educated on Daily salt intake goal < 2300 mg; Exercise goal of 150 minutes per week; Importance of home blood pressure monitoring; -Counseled to monitor BP at home weekly, document, and provide log at future appointments -Recommended to continue current medication  Hyperlipidemia: (LDL goal < 70) -History of TIA -uncontrolled -Current treatment: . Rosuvastatin 40 mg daily  -Medications previously tried: NA  -Educated on Importance of limiting foods high in cholesterol; -Recommended starting Zetia 10 mg daily if repeat LDL is still greater than 70  Diabetes (A1c goal <7%) -Diagnosed 2019  -uncontrolled -Current medications: . Trulicity 1.5 mg weekly  . Levemir 20 units daily  . Metformin 1000 mg twice daily  -Medications previously tried: Jardiance (Cost)  -Current home glucose readings . fasting glucose: 130s  . post  prandial glucose: NA -Denies hypoglycemic/hyperglycemic symptoms -Counseled to increase levemir by 2 units if fasting blood sugars consistently greater than 150.  -Patient assistance paperwork was not returned, patient states wife "messed up" the paperwork. Will resend and add PAP for Levemir.  -Could consider restarting Jardiance with PAP, patient wants to wait at this time.  -Recommended to continue current medication  Chronic Migraines (Goal: Prevent migraines) -Managed by Dr. Sherryll Burger  -Uncontrolled -Current treatment  . Emgality 120 mg into the skin every 28 days  . Topiramate 150 mg twice daily   -Medications previously tried: NA  -Migraines daily, hoping lumbar puncture will provide adequate relief. States he is receiving no relief from medication regimen.  -Recommended to continue current medication  Patient Goals/Self-Care Activities . Over the next 90 days, patient will:  - check glucose daily (before breakfast), document, and provide at future appointments check blood pressure weekly, document, and provide at future appointments  Follow Up Plan: Telephone follow up appointment with care management team member scheduled for:  07/25/2021 at 10:00 AM      Patient agreed to services and verbal consent obtained.   The patient verbalized understanding of instructions, educational materials, and care plan provided today and declined offer to receive copy of patient instructions, educational materials, and care plan.   Garey Ham, CPP Clinical Pharmacist [facility] [phone]

## 2021-04-24 NOTE — Progress Notes (Signed)
Chronic Care Management Pharmacy Note  04/24/2021 Name:  Donald [name] MRN:  [id number] DOB:  1966-05-19  Subjective: Donald [name] is an 55 y.o. year old male who is a primary patient of Delsa Grana, [place].  The CCM team was consulted for assistance with disease management and care coordination needs.    Engaged with patient by telephone for follow up visit in response to provider referral for pharmacy case management and/or care coordination services.   Consent to Services:  The patient was given information about Chronic Care Management services, agreed to services, and gave verbal consent prior to initiation of services.  Please see initial visit note for detailed documentation.   Patient Care Team: Delsa Grana, PA-C as PCP - General (Family Medicine) Germaine Pomfret, [facility] (Pharmacist)  Recent office visits: 03/26/21: Patient presented to Delsa Grana, PA-C for follow-up. A1c improved to 7.9%. LDL improved to 54. Levemir increased to 22 units daily.  12/18/2020: Patient presented to Delsa Grana, PA-C for follow-up. A1c worsened to 13.6%. Levemir increased to 16 units daily   Recent consult visits: 04/11/21: Patient presented to Gayland Curry, [place] (neurology) for follow-up. Lumbar puncture scheduled for 04/26/21.  11/21/20: Patient presented to Dr. Manuella Ghazi (Neurology) for follow-up. No medication changes made.   Hospital visits: None in previous 6 months  Objective:  Lab Results  Component Value Date   CREATININE 1.23 03/26/2021   BUN 26 (H) 03/26/2021   GFRNONAA 65 03/26/2021   GFRAA 76 03/26/2021   NA 138 03/26/2021   K 4.9 03/26/2021   CALCIUM 9.9 03/26/2021   CO2 26 03/26/2021    Lab Results  Component Value Date/Time   HGBA1C 7.9 (H) 03/26/2021 11:46 AM   HGBA1C 13.6 (H) 12/18/2020 11:02 AM   MICROALBUR 1.3 01/17/2020 10:27 AM   MICROALBUR 38.9 11/15/2018 08:21 AM   MICROALBUR NEG 07/31/2016 04:14 PM    Last diabetic Eye exam:  Lab Results  Component Value  Date/Time   HMDIABEYEEXA No Retinopathy 04/04/2021 12:00 AM    Last diabetic Foot exam: No results found for: HMDIABFOOTEX   Lab Results  Component Value Date   CHOL 109 03/26/2021   HDL 38 (L) 03/26/2021   LDLCALC 54 03/26/2021   TRIG 83 03/26/2021   CHOLHDL 2.9 03/26/2021    Hepatic Function Latest Ref Rng & Units 03/26/2021 12/18/2020 07/12/2020  Total Protein 6.1 - 8.1 g/dL 7.8 7.4 8.1  Albumin 3.5 - 5.0 g/dL - - -  AST 10 - 35 U/L 14 15 22   ALT 9 - 46 U/L 18 25 37  Alk Phosphatase 38 - 126 U/L - - -  Total Bilirubin 0.2 - 1.2 mg/dL 0.2 0.3 0.3    Lab Results  Component Value Date/Time   TSH 0.73 04/15/2019 08:43 AM   TSH 0.44 03/27/2017 10:50 AM    CBC Latest Ref Rng & Units 03/26/2021 12/18/2020 07/12/2020  WBC 3.8 - 10.8 Thousand/uL 9.8 11.0(H) 10.2  Hemoglobin 13.2 - 17.1 g/dL 12.9(L) 13.0(L) 15.2  Hematocrit 38.5 - 50.0 % 39.3 39.3 44.8  Platelets 140 - 400 Thousand/uL 290 285 318    Lab Results  Component Value Date/Time   VD25OH 63 07/12/2020 03:39 PM   VD25OH 14 (L) 01/17/2020 10:27 AM    Clinical ASCVD: Yes  The ASCVD Risk score Mikey Bussing DC Jr., et al., 2013) failed to calculate for the following reasons:   The patient has a prior MI or stroke diagnosis    Depression screen [facility] 2/9 03/26/2021 12/18/2020  07/12/2020  Decreased Interest 0 2 0  Down, Depressed, Hopeless 0 2 0  PHQ - 2 Score 0 4 0  Altered sleeping 0 1 0  Tired, decreased energy 0 2 0  Change in appetite 0 0 0  Feeling bad or failure about yourself  0 0 0  Trouble concentrating 0 1 0  Moving slowly or fidgety/restless 0 0 0  Suicidal thoughts 0 0 0  PHQ-9 Score 0 8 0  Difficult doing work/chores Not difficult at all Somewhat difficult Not difficult at all  Some recent data might be hidden     Social History   Tobacco Use  Smoking Status Current Every Day Smoker  . Packs/day: 0.50  . Years: 30.00  . Pack years: 15.00  . Types: Cigarettes  Smokeless Tobacco Never Used   BP Readings  from Last 3 Encounters:  03/26/21 118/74  12/18/20 118/64  07/12/20 122/86   Pulse Readings from Last 3 Encounters:  03/26/21 100  12/18/20 74  07/12/20 92   Wt Readings from Last 3 Encounters:  03/26/21 217 lb 8 oz (98.7 kg)  12/18/20 214 lb 1.6 oz (97.1 kg)  07/12/20 219 lb 6.4 oz (99.5 kg)    Assessment/Interventions: Review of patient past medical history, allergies, medications, health status, including review of consultants reports, laboratory and other test data, was performed as part of comprehensive evaluation and provision of chronic care management services.   SDOH:  (Social Determinants of Health) assessments and interventions performed: Yes SDOH Interventions   Flowsheet Row Most Recent Value  SDOH Interventions   Financial Strain Interventions Other (Comment)  [PAP]      CCM Care Plan  Allergies  Allergen Reactions  . No Known Allergies     Medications Reviewed Today    Reviewed by Germaine Pomfret, [facility] (Pharmacist) on 04/24/21 at 1023  Med List Status: <None>  Medication Order Taking? Sig Documenting Provider Last Dose Status Informant  aspirin EC 81 MG EC tablet [id number]  Take 1 tablet (81 mg total) by mouth daily. Hillary Bow, MD  Active Self  Blood Glucose Monitoring Suppl (D-CARE GLUCOMETER) w/Device KIT [id number]  1 Units by Does not apply route 4 (four) times daily -  before meals and at bedtime. Hillary Bow, MD  Active Self  Dulaglutide (TRULICITY) 1.5 JQ/3.0SP SOPN [id number]  Inject 1.5 mg into the skin once a week. Delsa Grana, PA-C  Active   Galcanezumab-gnlm [facility]) 120 MG/ML Darden Palmer [id number]  Inject 120 mg into the skin. 28 days [provider]  Active   insulin detemir (LEVEMIR FLEXTOUCH) 100 UNIT/ML FlexPen [id number]  Inject 15-30 Units into the skin at bedtime.  Patient taking differently: Inject 20 Units into the skin at bedtime.   Delsa Grana, PA-C  Active   Insulin Pen Needle (BD PEN NEEDLE MICRO U/F) 32G X 6 MM MISC  [id number]  USE FOR LEVEMIR INJECTION DAILY Delsa Grana, PA-C  Active   lisinopril (ZESTRIL) 10 MG tablet [id number] Yes TAKE 1 TABLET BY MOUTH EVERY DAY Delsa Grana, PA-C Taking Active   metFORMIN (GLUCOPHAGE) 1000 MG tablet [id number] Yes Take 1 tablet (1,000 mg total) by mouth 2 (two) times daily with a meal. Delsa Grana, PA-C Taking Active   rosuvastatin (CRESTOR) 40 MG tablet [id number] Yes Take 1 tablet (40 mg total) by mouth at bedtime. Delsa Grana, PA-C Taking Active   topiramate (TOPAMAX) 50 MG tablet [id number] Yes 150 mg 2 (two) times daily. [provider] Taking Active  Med Note Maryruth Bun Jan 18, 2020 12:20 PM)            Patient Active Problem List   Diagnosis Date Noted  . Chronic intractable headache 02/10/2020  . Diabetes mellitus type 2 in obese ([place]) 11/12/2018  . 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 Obesity (BMI 30.0-34.9) 05/27/2018  . Edema of esophagus   . Angiodysplasia of stomach and duodenum   . Stomach irritation   . Heartburn   . Smoker 05/11/2018  . Hypertension 09/15/2017  . Hx of transient ischemic attack (TIA) 08/05/2017  . Peripheral vascular insufficiency ([place]) 07/06/2017  . Hyperlipidemia associated with type 2 diabetes mellitus ([place]) 10/31/2016    Immunization History  Administered Date(s) Administered  . Influenza,inj,Quad PF,6+ Mos 10/31/2016, 08/27/2017, 09/29/2018, 09/27/2019  . PFIZER(Purple Top)SARS-COV-2 Vaccination 03/10/2020, 04/04/2020  . Pneumococcal Polysaccharide-23 11/27/2017    Conditions to be addressed/monitored:  Hypertension, Hyperlipidemia, Diabetes, Tobacco use and Chronic Migraines, and History of TIA   Care Plan : General Pharmacy (Adult)  Updates made by Germaine Pomfret, [place] since 04/24/2021 12:00 AM    Problem: Hypertension, Hyperlipidemia, Diabetes, Tobacco use and Chronic Migraines, and History of TIA   Priority: High    Long-Range Goal: Patient-Specific Goal   Start Date:  01/23/2021  Expected End Date: 10/25/2021  This Visit's Progress: On track  Recent Progress: On track  Priority: High  Note:   Current Barriers:  . Unable to achieve control of diabetes   Pharmacist Clinical Goal(s):  Marland Kitchen Over the next 90 days, patient will achieve control of diabetes as evidenced by A1c less than 8% through collaboration with PharmD and provider.   Interventions: . 1:1 collaboration with Delsa Grana, PA-C regarding development and update of comprehensive plan of care as evidenced by provider attestation and co-signature . Inter-disciplinary care team collaboration (see longitudinal plan of care) . Comprehensive medication review performed; medication list updated in electronic medical record  Hypertension (BP goal <140/90) -controlled -Current treatment: . Lisinopril 10 mg daily  -Medications previously tried: NA  -Current home readings: NA -Current dietary habits: Patient does not follow any specific dietary regimen -Current exercise habits: Patient does not follow any specific exercise regimen  -Denies hypotensive/hypertensive symptoms -Educated on Daily salt intake goal < 2300 mg; Exercise goal of 150 minutes per week; Importance of home blood pressure monitoring; -Counseled to monitor BP at home weekly, document, and provide log at future appointments -Recommended to continue current medication  Hyperlipidemia: (LDL goal < 70) -History of TIA -uncontrolled -Current treatment: . Rosuvastatin 40 mg daily  -Medications previously tried: NA  -Educated on Importance of limiting foods high in cholesterol; -Recommended starting Zetia 10 mg daily if repeat LDL is still greater than 70  Diabetes (A1c goal <7%) -Diagnosed 2019  -uncontrolled -Current medications: . Trulicity 1.5 mg weekly  . Levemir 20 units daily  . Metformin 1000 mg twice daily  -Medications previously tried: Jardiance (Cost)  -Current home glucose readings . fasting glucose: 130s   . post prandial glucose: NA -Denies hypoglycemic/hyperglycemic symptoms -Counseled to increase levemir by 2 units if fasting blood sugars consistently greater than 150.  -Patient assistance paperwork was not returned, patient states wife "messed up" the paperwork. Will resend and add PAP for Levemir.  -Could consider restarting Jardiance with PAP, patient wants to wait at this time.  -Recommended to continue current medication  Chronic Migraines (Goal: Prevent migraines) -Managed by Dr. Manuella Ghazi  -Uncontrolled -Current treatment  . Emgality 120 mg into the  skin every 28 days  . Topiramate 150 mg twice daily  -Medications previously tried: NA  -Migraines daily, hoping lumbar puncture will provide adequate relief. States he is receiving no relief from medication regimen.  -Recommended to continue current medication  Patient Goals/Self-Care Activities . Over the next 90 days, patient will:  - check glucose daily (before breakfast), document, and provide at future appointments check blood pressure weekly, document, and provide at future appointments  Follow Up Plan: Telephone follow up appointment with care management team member scheduled for:  07/25/2021 at 10:00 AM      Medication Assistance: Application for Jardiance, Levemir, Trulicity  medication assistance program. in process.  Anticipated assistance start date 02/25/2021.  See plan of care for additional detail.  Patient's preferred pharmacy is:  [id number] [place] [id number]: [phone]: [phone] Uses pill box? Yes Pt endorses 100% compliance  We discussed: Current pharmacy is preferred with insurance plan and patient is satisfied with pharmacy services Patient decided to: Continue current medication management strategy  Care Plan and Follow Up Patient Decision:  Patient agrees to Care Plan and Follow-up.  Plan: Telephone follow up appointment  with care management team member scheduled for:  07/25/2021 at 10:00 AM  [phone]

## 2021-04-26 ENCOUNTER — Other Ambulatory Visit: Payer: Self-pay

## 2021-04-26 ENCOUNTER — Ambulatory Visit
Admission: RE | Admit: 2021-04-26 | Discharge: 2021-04-26 | Disposition: A | Discharge status: Home / Self Care | Payer: 59 | Source: Ambulatory Visit | Attending: Neurology | Admitting: Neurology

## 2021-04-26 DIAGNOSIS — G43719 Chronic migraine without aura, intractable, without status migrainosus: Secondary | ICD-10-CM

## 2021-04-26 LAB — CSF CELL COUNT WITH DIFFERENTIAL
Eosinophils, CSF: 0 %
Lymphs, CSF: 56 %
Monocyte-Macrophage-Spinal Fluid: 44 %
RBC Count, CSF: 0 /mm3 (ref 0–3)
Segmented Neutrophils-CSF: 0 %
Tube #: 3
WBC, CSF: 19 /mm3 (ref 0–5)

## 2021-04-26 LAB — GLUCOSE, CAPILLARY: Glucose-Capillary: 122 mg/dL — ABNORMAL HIGH (ref 70–99)

## 2021-04-26 LAB — GLUCOSE, CSF: Glucose, CSF: 98 mg/dL — ABNORMAL HIGH (ref 40–70)

## 2021-04-26 LAB — PROTEIN, CSF: Total  Protein, CSF: 53 mg/dL — ABNORMAL HIGH (ref 15–45)

## 2021-04-26 MED ORDER — LIDOCAINE HCL (PF) 1 % IJ SOLN
5.0000 mL | Freq: Once | INTRAMUSCULAR | Status: AC
  Administered 2021-04-26: 5 mL
  Filled 2021-04-26: qty 5

## 2021-04-26 NOTE — Progress Notes (Signed)
Danielle handed patient off to RN gretchen in specials bay 3

## 2021-04-30 LAB — PATHOLOGIST SMEAR REVIEW

## 2021-04-30 LAB — IGG CSF INDEX
Albumin CSF-mCnc: 23 mg/dL (ref 15–55)
Albumin: 3.8 g/dL (ref 3.8–4.9)
CSF IgG Index: 0.6 (ref 0.0–0.7)
IgG (Immunoglobin G), Serum: 1328 mg/dL (ref 603–1613)
IgG, CSF: 5 mg/dL (ref 0.0–10.3)
IgG/Alb Ratio, CSF: 0.22 (ref 0.00–0.25)

## 2021-05-02 ENCOUNTER — Telehealth: Payer: Self-pay

## 2021-05-02 LAB — OLIGOCLONAL BANDS, CSF + SERM

## 2021-05-02 NOTE — Chronic Care Management (AMB) (Signed)
    Chronic Care Management Pharmacy Assistant   Name: Donald [name]  MRN: [id number] DOB: May 26, 1966  Reason for Encounter: Patient Assistance Coordination   05/02/2021- Patient assistance form filled out for Levemir 30 units daily with [employer] Patient assistance program. Reprinted application for Trulicity with [employer]. Called patient to inform that I will place applications in the mail with the areas in which he needs to sign highlighted or he can take forms back to PCP office for Junius Argyle, CPP to assist in filling out. Left message for patient to return call for instructions or to return call when he receives forms in the mail. Junius Argyle, CPP notified.   Medications: Outpatient Encounter Medications as of 05/02/2021  Medication Sig  . aspirin EC 81 MG EC tablet Take 1 tablet (81 mg total) by mouth daily.  . Blood Glucose Monitoring Suppl (D-CARE GLUCOMETER) w/Device KIT 1 Units by Does not apply route 4 (four) times daily -  before meals and at bedtime.  . Dulaglutide (TRULICITY) 1.5 KG/2.5KY SOPN Inject 1.5 mg into the skin once a week.  . Galcanezumab-gnlm (EMGALITY) 120 MG/ML SOAJ Inject 120 mg into the skin. 28 days  . insulin detemir (LEVEMIR FLEXTOUCH) 100 UNIT/ML FlexPen Inject 15-30 Units into the skin at bedtime. (Patient taking differently: Inject 20 Units into the skin at bedtime.)  . Insulin Pen Needle (BD PEN NEEDLE MICRO U/F) 32G X 6 MM MISC USE FOR LEVEMIR INJECTION DAILY  . lisinopril (ZESTRIL) 10 MG tablet TAKE 1 TABLET BY MOUTH EVERY DAY  . metFORMIN (GLUCOPHAGE) 1000 MG tablet Take 1 tablet (1,000 mg total) by mouth 2 (two) times daily with a meal.  . rosuvastatin (CRESTOR) 40 MG tablet Take 1 tablet (40 mg total) by mouth at bedtime.  . topiramate (TOPAMAX) 50 MG tablet 150 mg 2 (two) times daily.   No facility-administered encounter medications on file as of 05/02/2021.    Star Rating Drugs: Trulicity- Last filled 7/0/6237  Jardiance 25 mg- [facility] Lisinopril 10 mg- Last filled [facility]25/2021 [facility] Rosuvastatin 40 mg- Last filled [facility]/25/2021 [facility] Metformin 1000 mg- Last [facility]18/20[facility].  SEG:BTDVVO [place], [place] Pharmacist Assistant [phone]

## 2021-05-30 ENCOUNTER — Telehealth: Payer: Self-pay

## 2021-05-30 NOTE — Progress Notes (Signed)
    Chronic Care Management Pharmacy Assistant   Name: Donald [name]  MRN: [id number] DOB: 1966-05-14    Reason for Encounter: Medication Review   Recent office visits:  No recent Office Visit  Recent consult visits:  No recent [place] [facility] visits:  None in previous 6 months  Medications: Outpatient Encounter Medications as of 05/30/2021  Medication Sig   aspirin EC 81 MG EC tablet Take 1 tablet (81 mg total) by mouth daily.   Blood Glucose Monitoring Suppl (D-CARE GLUCOMETER) w/Device KIT 1 Units by Does not apply route 4 (four) times daily -  before meals and at bedtime.   Dulaglutide (TRULICITY) 1.5 OI/5.1GF SOPN Inject 1.5 mg into the skin once a week.   Galcanezumab-gnlm (EMGALITY) 120 MG/ML SOAJ Inject 120 mg into the skin. 28 days   insulin detemir (LEVEMIR FLEXTOUCH) 100 UNIT/ML FlexPen Inject 15-30 Units into the skin at bedtime. (Patient taking differently: Inject 20 Units into the skin at bedtime.)   Insulin Pen Needle (BD PEN NEEDLE MICRO U/F) 32G X 6 MM MISC USE FOR LEVEMIR INJECTION DAILY   lisinopril (ZESTRIL) 10 MG tablet TAKE 1 TABLET BY MOUTH EVERY DAY   metFORMIN (GLUCOPHAGE) 1000 MG tablet Take 1 tablet (1,000 mg total) by mouth 2 (two) times daily with a meal.   rosuvastatin (CRESTOR) 40 MG tablet Take 1 tablet (40 mg total) by mouth at bedtime.   topiramate (TOPAMAX) 50 MG tablet 150 mg 2 (two) times daily.   No facility-administered encounter medications on file as of 05/30/2021.    Care Gaps: None ID Star Rating Drugs: Trulicity- [facility]42/10[facility] Jardiance 25 mg- Last filled [facility]/10/2020 [facility] Lisinopril 10 mg- Last filled 03/25/2021 [facility] Rosuvastatin 40 mg- Last filled 03/25/2021 [facility] Metformin 1000 mg- Last [facility]18/[facility]  Reviewed chart, patient has no Care gaps.  [place] Pharmacist  Assistant [phone]

## 2021-06-19 ENCOUNTER — Telehealth: Payer: Self-pay

## 2021-06-19 NOTE — Progress Notes (Signed)
Chronic Care Management Pharmacy Assistant   Name: HARGIS VANDYNE  MRN: [id number] DOB: 04-08-1966   Reason for Encounter:Diabetes  Disease State Call.   Recent office visits:  No recent Office Visit  Recent consult visits:  No recent [facility] visits:  None in previous 6 months  Medications: Outpatient Encounter Medications as of 06/19/2021  Medication Sig   aspirin EC 81 MG EC tablet Take 1 tablet (81 mg total) by mouth daily.   Blood Glucose Monitoring Suppl (D-CARE GLUCOMETER) w/Device KIT 1 Units by Does not apply route 4 (four) times daily -  before meals and at bedtime.   Dulaglutide (TRULICITY) 1.5 FO/2.7XA SOPN Inject 1.5 mg into the skin once a week.   Galcanezumab-gnlm (EMGALITY) 120 MG/ML SOAJ Inject 120 mg into the skin. 28 days   insulin detemir (LEVEMIR FLEXTOUCH) 100 UNIT/ML FlexPen Inject 15-30 Units into the skin at bedtime. (Patient taking differently: Inject 20 Units into the skin at bedtime.)   Insulin Pen Needle (BD PEN NEEDLE MICRO U/F) 32G X 6 MM MISC USE FOR LEVEMIR INJECTION DAILY   lisinopril (ZESTRIL) 10 MG tablet TAKE 1 TABLET BY MOUTH EVERY DAY   metFORMIN (GLUCOPHAGE) 1000 MG tablet Take 1 tablet (1,000 mg total) by mouth 2 (two) times daily with a meal.   rosuvastatin (CRESTOR) 40 MG tablet Take 1 tablet (40 mg total) by mouth at bedtime.   topiramate (TOPAMAX) 50 MG tablet 150 mg 2 (two) times daily.   No facility-administered encounter medications on file as of 06/19/2021.    Care Gaps: Shingrix Vaccine Pneumococcal Vaccine COVID-19 Vaccine Star Rating Drugs: Trulicity- Last [facility] 12/28/7865 [facility] Jardiance 25 mg- Last filled 02/10/2020 [facility] Lisinopril 10 mg- Last filled 03/25/2021 [facility] Rosuvastatin 40 mg- Last filled 03/25/2021 [facility] Metformin 1000 mg- Last filled [facility]/18/2021 [facility]. Medication fill gaps: Insulin detemir  100  UNIT/ML FlexPen last filled 07/16/2020 75 day supply  Recent Relevant Labs: Lab Results  Component Value Date/Time   HGBA1C 7.9 (H) 03/26/2021 11:46 AM   HGBA1C 13.6 (H) 12/18/2020 11:02 AM   MICROALBUR 1.3 01/17/2020 10:27 AM   MICROALBUR 38.9 11/15/2018 08:21 AM   MICROALBUR NEG 07/31/2016 04:14 PM    Kidney Function Lab Results  Component Value Date/Time   CREATININE 1.23 03/26/2021 11:46 AM   CREATININE 1.03 12/18/2020 11:02 AM   GFRNONAA 65 03/26/2021 11:46 AM   GFRAA 76 03/26/2021 11:46 AM    Current antihyperglycemic regimen:  Trulicity 1.5 mg weekly Levemir 20 units daily Metformin 1000 mg twice daily What recent interventions/DTPs have been made to improve glycemic control:   04/24/2021 Daron Offer RPH increase levemir by 2 units if fasting blood sugars consistently greater than 150 Have there been any recent hospitalizations or ED visits since last visit with CPP? No Patient denies hypoglycemic symptoms, including Pale, Sweaty, Shaky, Hungry, Nervous/irritable, and Vision changes Patient denies hyperglycemic symptoms, including blurry vision, excessive thirst, fatigue, polyuria, and weakness How often are you checking your blood sugar?  Patient states his doctor told him he did not have to check his blood sugar daily.Patient reports he checks his blood sugar every other day. What are your blood sugars ranging?  Fasting: N/A Before meals: 120,125 After meals: N/A Bedtime: N/A During the week, how often does your blood glucose drop below 70? Never Are you checking your feet  daily/regularly?   Patient denies numbness,tingling or pain sensation in his feet.  Adherence Review: Is the patient currently on a STATIN medication? Yes Is the patient currently on ACE/ARB medication? Yes Does the patient have >5 day gap between last estimated fill dates? No   Anderson [place] Clinical Production designer, theatre/television/film [phone]

## 2021-06-25 ENCOUNTER — Ambulatory Visit: Payer: 59 | Admitting: Family Medicine

## 2021-07-08 ENCOUNTER — Encounter: Payer: Self-pay | Admitting: Family Medicine

## 2021-07-08 ENCOUNTER — Other Ambulatory Visit: Payer: Self-pay

## 2021-07-08 ENCOUNTER — Ambulatory Visit (INDEPENDENT_AMBULATORY_CARE_PROVIDER_SITE_OTHER): Payer: 59 | Admitting: Family Medicine

## 2021-07-08 VITALS — BP 110/64 | HR 91 | Temp 98.7°F | Resp 18 | Ht 71.0 in | Wt 220.5 lb

## 2021-07-08 DIAGNOSIS — E669 Obesity, unspecified: Secondary | ICD-10-CM

## 2021-07-08 DIAGNOSIS — G8929 Other chronic pain: Secondary | ICD-10-CM

## 2021-07-08 DIAGNOSIS — I10 Essential (primary) hypertension: Secondary | ICD-10-CM

## 2021-07-08 DIAGNOSIS — E1165 Type 2 diabetes mellitus with hyperglycemia: Secondary | ICD-10-CM

## 2021-07-08 DIAGNOSIS — D649 Anemia, unspecified: Secondary | ICD-10-CM

## 2021-07-08 DIAGNOSIS — Z23 Encounter for immunization: Secondary | ICD-10-CM

## 2021-07-08 DIAGNOSIS — R519 Headache, unspecified: Secondary | ICD-10-CM

## 2021-07-08 DIAGNOSIS — Z794 Long term (current) use of insulin: Secondary | ICD-10-CM

## 2021-07-08 DIAGNOSIS — E1169 Type 2 diabetes mellitus with other specified complication: Secondary | ICD-10-CM | POA: Diagnosis not present

## 2021-07-08 DIAGNOSIS — IMO0002 Reserved for concepts with insufficient information to code with codable children: Secondary | ICD-10-CM

## 2021-07-08 DIAGNOSIS — E785 Hyperlipidemia, unspecified: Secondary | ICD-10-CM

## 2021-07-08 MED ORDER — ROSUVASTATIN CALCIUM 40 MG PO TABS: 40.0000 mg | ORAL_TABLET | Freq: Every day | ORAL | 3 refills | Status: DC

## 2021-07-08 NOTE — Progress Notes (Signed)
Name: Donald [name]   MRN: [id number]    DOB: 1966-03-28   Date:07/08/2021       Progress Note  Chief Complaint  Patient presents with   Hyperlipidemia   Hypertension   Diabetes     Subjective:   Donald [name] is a 55 y.o. male, presents to clinic for routine f/up  Hyperlipidemia: Currently treated with crestor 40 mg, pt reports good med compliance Last Lipids: Lab Results  Component Value Date   CHOL 109 03/26/2021   HDL 38 (L) 03/26/2021   LDLCALC 54 03/26/2021   TRIG 83 03/26/2021   CHOLHDL 2.9 03/26/2021   - Denies: Chest pain, shortness of breath, myalgias, claudication  Hypertension:  Currently managed on lisinopril 10 mg  - BP lower today than normal  Pt reports good med compliance and denies any SE.   Blood pressure today is well controlled. BP Readings from Last 3 Encounters:  07/08/21 99/62  04/26/21 127/84  03/26/21 118/74   Pt denies CP, SOB, exertional sx, LE edema, palpitation, visual disturbances, lightheadedness, hypotension, syncope.   DM:   Pt managing DM with 20 units and trulicity metformin 2248 mg bid Reports good med compliance Pt has no SE from meds. Denies: Polyuria, polydipsia, vision changes, neuropathy, hypoglycemia Recent pertinent labs: Lab Results  Component Value Date   HGBA1C 7.9 (H) 03/26/2021   HGBA1C 13.6 (H) 12/18/2020   HGBA1C 6.9 (H) 07/12/2020   Standard of care and health maintenance: Foot exam:  UTD DM eye exam:  done ACEI/ARB:  lisinopril Statin:  crestor  Weight stable   Wt Readings from Last 5 Encounters:  07/08/21 220 lb 8 oz (100 kg)  04/26/21 217 lb (98.4 kg)  03/26/21 217 lb 8 oz (98.7 kg)  12/18/20 214 lb 1.6 oz (97.1 kg)  07/12/20 219 lb 6.4 oz (99.5 kg)   BMI Readings from Last 5 Encounters:  07/08/21 30.75 kg/m  04/26/21 29.84 kg/m  03/26/21 30.34 kg/m  12/18/20 29.04 kg/m  07/12/20 29.76 kg/m   Cerebellar stroke and microvascular disease - on asa 81 and crestor - found on recent  MRI, not on prior MRI from 2020 Still having HA's and dizziness Did get LP Still on emgality and topamax   Current Outpatient Medications:    aspirin EC 81 MG EC tablet, Take 1 tablet (81 mg total) by mouth daily., Disp: , Rfl:    Blood Glucose Monitoring Suppl (D-CARE GLUCOMETER) w/Device KIT, 1 Units by Does not apply route 4 (four) times daily -  before meals and at bedtime., Disp: 1 kit, Rfl: 0   Dulaglutide (TRULICITY) 1.5 GN/0.0BB SOPN, Inject 1.5 mg into the skin once a week., Disp: 6 mL, Rfl: 3   Galcanezumab-gnlm (EMGALITY) 120 MG/ML SOAJ, Inject 120 mg into the skin. 28 days, Disp: , Rfl:    insulin detemir (LEVEMIR FLEXTOUCH) 100 UNIT/ML FlexPen, Inject 15-30 Units into the skin at bedtime. (Patient taking differently: Inject 20 Units into the skin at bedtime.), Disp: 15 mL, Rfl: 3   Insulin Pen Needle (BD PEN NEEDLE MICRO U/F) 32G X 6 MM MISC, USE FOR LEVEMIR INJECTION DAILY, Disp: 100 each, Rfl: 5   lisinopril (ZESTRIL) 10 MG tablet, TAKE 1 TABLET BY MOUTH EVERY DAY, Disp: 90 tablet, Rfl: 3   metFORMIN (GLUCOPHAGE) 1000 MG tablet, Take 1 tablet (1,000 mg total) by mouth 2 (two) times daily with a meal., Disp: 180 tablet, Rfl: 3   rosuvastatin (CRESTOR) 40 MG tablet, Take 1 tablet (40 mg  total) by mouth at bedtime., Disp: 90 tablet, Rfl: 3   topiramate (TOPAMAX) 50 MG tablet, 150 mg 2 (two) times daily., Disp: , Rfl:   Patient Active Problem List   Diagnosis Date Noted   Chronic intractable headache 02/10/2020   Diabetes mellitus type 2 in obese (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   Obesity (BMI 30.0-34.9) 05/27/2018   Edema of esophagus    Angiodysplasia of stomach and duodenum    Stomach irritation    Heartburn    Smoker 05/11/2018   Hypertension 09/15/2017   Hx of transient ischemic attack (TIA) 08/05/2017   Peripheral vascular insufficiency ([place]) 07/06/2017   Hyperlipidemia associated with type 2 diabetes mellitus ([place]) 10/31/2016    Past Surgical  History:  Procedure Laterality Date   ESOPHAGOGASTRODUODENOSCOPY (EGD) WITH PROPOFOL N/A 05/26/2018   Procedure: ESOPHAGOGASTRODUODENOSCOPY (EGD) WITH PROPOFOL;  Surgeon: Virgel Manifold, MD;  Location: [facility] ENDOSCOPY;  Service: Endoscopy;  Laterality: N/A;   KNEE SURGERY Right     Family History  Problem Relation Age of Onset   Hypertension Father    Diabetes Father    Liver disease Father    Diverticulitis Mother    Migraines Mother    Cancer Sister        tumors in the breast    Diabetes Maternal Grandfather    Cancer Sister        tumors in the breast    Cancer Sister        tumor in the breast     Social History   Tobacco Use   Smoking status: Every Day    Packs/day: 0.50    Years: 30.00    Pack years: 15.00    Types: Cigarettes   Smokeless tobacco: Never  Vaping Use   Vaping Use: Never used  Substance Use Topics   Alcohol use: Yes    Alcohol/week: 0.0 standard drinks    Comment: none in the last 3 weeks   Drug use: No     Allergies  Allergen Reactions   No Known Allergies     Health Maintenance  Topic Date Due   Pneumococcal Vaccine 69-72 Years old (2 - PCV) 11/27/2018   COVID-19 Vaccine (3 - Booster for Pfizer series) 07/24/2021 (Originally 09/04/2020)   INFLUENZA VACCINE  08/28/2021 (Originally 07/01/2021)   Zoster Vaccines- Shingrix (1 of 2) 10/08/2021 (Originally 06/13/2014)   HEMOGLOBIN A1C  09/25/2021   TETANUS/TDAP  12/01/2021   FOOT EXAM  03/26/2022   OPHTHALMOLOGY EXAM  04/08/2022   COLONOSCOPY (Pts 45-41yr Insurance coverage will need to be confirmed)  12/01/2024   PNEUMOCOCCAL POLYSACCHARIDE VACCINE AGE 18-64 HIGH RISK  Completed   Hepatitis C Screening  Completed   HIV Screening  Completed   HPV VACCINES  Aged Out    Chart Review Today: I personally reviewed active problem list, medication list, allergies, family history, social history, health maintenance, notes from last encounter, lab results, imaging with the patient/caregiver  today.   Review of Systems  Constitutional: Negative.   HENT: Negative.    Eyes: Negative.   Respiratory: Negative.    Cardiovascular: Negative.   Gastrointestinal: Negative.   Endocrine: Negative.   Genitourinary: Negative.   Musculoskeletal: Negative.   Skin: Negative.   Allergic/Immunologic: Negative.   Neurological: Negative.   Hematological: Negative.   Psychiatric/Behavioral: Negative.    All other systems reviewed and are negative.   Objective:   Vitals:   07/08/21 1442  BP: 99/62  Pulse: 91  Resp: 18  Temp: 98.7 F (37.1 C)  SpO2: 98%  Weight: 220 lb 8 oz (100 kg)  Height: 5' 11"  (1.803 m)    Body mass index is 30.75 kg/m.  Physical Exam Vitals and nursing note reviewed.  Constitutional:      General: He is not in acute distress.    Appearance: Normal appearance. He is well-developed. He is not ill-appearing, toxic-appearing or diaphoretic.     Interventions: Face mask in place.  HENT:     Head: Normocephalic and atraumatic.     Jaw: No trismus.     Right Ear: External ear normal.     Left Ear: External ear normal.  Eyes:     General: Lids are normal. No scleral icterus.       Right eye: No discharge.        Left eye: No discharge.     Conjunctiva/sclera: Conjunctivae normal.  Neck:     Trachea: Trachea and phonation normal. No tracheal deviation.  Cardiovascular:     Rate and Rhythm: Normal rate and regular rhythm.     Pulses: Normal pulses.          Radial pulses are 2+ on the right side and 2+ on the left side.       Posterior tibial pulses are 2+ on the right side and 2+ on the left side.     Heart sounds: Normal heart sounds. No murmur heard.   No friction rub. No gallop.  Pulmonary:     Effort: Pulmonary effort is normal. No respiratory distress.     Breath sounds: Normal breath sounds. No stridor. No wheezing, rhonchi or rales.  Abdominal:     General: Bowel sounds are normal. There is no distension.     Palpations: Abdomen is soft.   Musculoskeletal:     Right lower leg: No edema.     Left lower leg: No edema.  Skin:    General: Skin is warm and dry.     Coloration: Skin is not jaundiced.     Findings: No rash.     Nails: There is no clubbing.  Neurological:     Mental Status: He is alert. Mental status is at baseline.     Cranial Nerves: No dysarthria or facial asymmetry.     Motor: No tremor or abnormal muscle tone.     Gait: Gait normal.  Psychiatric:        Attention and Perception: Attention normal.        Mood and Affect: Mood is depressed. Mood is not anxious. Affect is not tearful or inappropriate.        Speech: Speech normal.        Behavior: Behavior normal. Behavior is cooperative.        Thought Content: Thought content normal.        Assessment & Plan:     ICD-10-CM   1. Insulin dependent type 2 diabetes mellitus, uncontrolled (HCC)  E11.65 Hemoglobin A1C   Z79.4    improving glycemic control - due for recheck of labs, continue trulicity and continue to titrate insulin     2. Essential hypertension  B63 COMPLETE METABOLIC PANEL WITH GFR   BP a little soft today, no syncope or near syncope    3. Diabetes mellitus type 2 in obese (HCC)  E11.69 Hemoglobin A1C   E66.9 rosuvastatin (CRESTOR) 40 MG tablet   same as above    4. Hyperlipidemia associated with type 2 diabetes mellitus ([place])  E11.69 COMPLETE  METABOLIC PANEL WITH GFR   E78.5 rosuvastatin (CRESTOR) 40 MG tablet   compliant with statin, no myalgias, due for labs    5. Need for pneumococcal vaccination  Z23 Pneumococcal conjugate vaccine 20-valent (Prevnar 20)    6. Anemia, unspecified type  D64.9 CBC with Differential/Platelet   recheck    7. Chronic intractable headache, unspecified headache type  R51.9    G89.29    had LP- reviewed records, pt did not get results - still having persistent HA, unhappy about sx and f/up again offered referral to different neuro for 2nd opini       Return for 3-4 month f/up on HTN IDDM .    Delsa Grana, PA-C 07/08/21 3:07 PM

## 2021-07-09 LAB — CBC WITH DIFFERENTIAL/PLATELET
Absolute Monocytes: 655 cells/uL (ref 200–950)
Basophils Absolute: 78 cells/uL (ref 0–200)
Basophils Relative: 0.7 %
Eosinophils Absolute: 222 cells/uL (ref 15–500)
Eosinophils Relative: 2 %
HCT: 38.8 % (ref 38.5–50.0)
Hemoglobin: 13.1 g/dL — ABNORMAL LOW (ref 13.2–17.1)
Lymphs Abs: 3208 cells/uL (ref 850–3900)
MCH: 31.1 pg (ref 27.0–33.0)
MCHC: 33.8 g/dL (ref 32.0–36.0)
MCV: 92.2 fL (ref 80.0–100.0)
MPV: 11.2 fL (ref 7.5–12.5)
Monocytes Relative: 5.9 %
Neutro Abs: 6938 cells/uL (ref 1500–7800)
Neutrophils Relative %: 62.5 %
Platelets: 284 10*3/uL (ref 140–400)
RBC: 4.21 10*6/uL (ref 4.20–5.80)
RDW: 13.1 % (ref 11.0–15.0)
Total Lymphocyte: 28.9 %
WBC: 11.1 10*3/uL — ABNORMAL HIGH (ref 3.8–10.8)

## 2021-07-09 LAB — COMPLETE METABOLIC PANEL WITH GFR
AG Ratio: 1.4 (calc) (ref 1.0–2.5)
ALT: 16 U/L (ref 9–46)
AST: 12 U/L (ref 10–35)
Albumin: 4.2 g/dL (ref 3.6–5.1)
Alkaline phosphatase (APISO): 64 U/L (ref 35–144)
BUN: 15 mg/dL (ref 7–25)
CO2: 26 mmol/L (ref 20–32)
Calcium: 10 mg/dL (ref 8.6–10.3)
Chloride: 107 mmol/L (ref 98–110)
Creat: 1.09 mg/dL (ref 0.70–1.30)
Globulin: 3.1 g/dL (calc) (ref 1.9–3.7)
Glucose, Bld: 121 mg/dL — ABNORMAL HIGH (ref 65–99)
Potassium: 4.3 mmol/L (ref 3.5–5.3)
Sodium: 141 mmol/L (ref 135–146)
Total Bilirubin: 0.2 mg/dL (ref 0.2–1.2)
Total Protein: 7.3 g/dL (ref 6.1–8.1)
eGFR: 79 mL/min/{1.73_m2} (ref >=60)

## 2021-07-09 LAB — HEMOGLOBIN A1C
Hgb A1c MFr Bld: 7 % of total Hgb — ABNORMAL HIGH (ref <5.7)
Mean Plasma Glucose: 154 mg/dL
eAG (mmol/L): 8.5 mmol/L

## 2021-07-24 ENCOUNTER — Telehealth: Payer: Self-pay

## 2021-07-24 NOTE — Progress Notes (Signed)
    Chronic Care Management Pharmacy Assistant   Name: Donald [name]  MRN: [id number] DOB: January 06, 1966   Patient called to be reminded of his appointment with Angelena Sole, CPP on 07/25/2021 @1000  via telephone.   Patient aware of appointment date, time, and type of appointment (either telephone or in person). Patient aware to have/bring all medications, supplements, blood pressure and/or blood sugar logs to visit.  Questions: Are there any concerns you would like to discuss during your office visit? No  Are you having any problems obtaining your medications? No  Star Rating Drug: Rosuvastatin 40 mg last filled on 07/08/2021 [facility] Trulicity 1.5 mg last filled on 06/12/2021 for an [facility]-Day supply [facility] Metformin 1000 mg last filled on 06/29/2021 for a [facility]-Day supply [facility] Lisinopril 10 mg last filled on 03/25/2021 for a [facility] supply [facility]  Any gaps in medications fill history? Yes  Care Gaps: PNA Vaccine   03/27/2021, CPA/CMA Clinical Pharmacist Assistant Phone: [phone]

## 2021-07-25 ENCOUNTER — Ambulatory Visit: Payer: 59

## 2021-07-25 DIAGNOSIS — E785 Hyperlipidemia, unspecified: Secondary | ICD-10-CM

## 2021-07-25 DIAGNOSIS — E1165 Type 2 diabetes mellitus with hyperglycemia: Secondary | ICD-10-CM

## 2021-07-25 DIAGNOSIS — IMO0002 Reserved for concepts with insufficient information to code with codable children: Secondary | ICD-10-CM

## 2021-07-25 DIAGNOSIS — E1169 Type 2 diabetes mellitus with other specified complication: Secondary | ICD-10-CM

## 2021-07-25 NOTE — Progress Notes (Signed)
Chronic Care Management Pharmacy Note  07/25/2021 Name:  Donald [name] MRN:  [id number] DOB:  Jun 24, 1966  Subjective: Donald [name] is an 55 y.o. year old male who is a primary patient of Delsa Grana, [place].  The CCM team was consulted for assistance with disease management and care coordination needs.    Engaged with patient by telephone for follow up visit in response to provider referral for pharmacy case management and/or care coordination services.   Consent to Services:  The patient was given information about Chronic Care Management services, agreed to services, and gave verbal consent prior to initiation of services.  Please see initial visit note for detailed documentation.   Patient Care Team: Delsa Grana, PA-C as PCP - General (Family Medicine) Germaine Pomfret, [facility] (Pharmacist)  Recent office visits: 07/08/21: Patient presented to Delsa Grana, PA-C for follow-up improved to 7.0%. Topiramate 150 mg twice daily.  03/26/21: Patient presented to Delsa Grana, PA-C for follow-up. A1c improved to 7.9%. LDL improved to 54. Levemir increased to 22 units daily.  12/18/2020: Patient presented to Delsa Grana, PA-C for follow-up. A1c worsened to 13.6%. Levemir increased to 16 units daily   Recent consult visits: 04/11/21: Patient presented to Gayland Curry, [place] (neurology) for follow-up. Lumbar puncture scheduled for 04/26/21.  11/21/20: Patient presented to Dr. Manuella Ghazi (Neurology) for follow-up. No medication changes made.   Hospital visits: None in previous 6 months  Objective:  Lab Results  Component Value Date   CREATININE 1.09 07/08/2021   BUN 15 07/08/2021   GFRNONAA 65 03/26/2021   GFRAA 76 03/26/2021   NA 141 07/08/2021   K 4.3 07/08/2021   CALCIUM 10.0 07/08/2021   CO2 26 07/08/2021    Lab Results  Component Value Date/Time   HGBA1C 7.0 (H) 07/08/2021 03:22 PM   HGBA1C 7.9 (H) 03/26/2021 11:46 AM   MICROALBUR 1.3 01/17/2020 10:27 AM   MICROALBUR 38.9 11/15/2018  08:21 AM   MICROALBUR NEG 07/31/2016 04:14 PM    Last diabetic Eye exam:  Lab Results  Component Value Date/Time   HMDIABEYEEXA No Retinopathy 04/04/2021 12:00 AM    Last diabetic Foot exam: No results found for: HMDIABFOOTEX   Lab Results  Component Value Date   CHOL 109 03/26/2021   HDL 38 (L) 03/26/2021   LDLCALC 54 03/26/2021   TRIG 83 03/26/2021   CHOLHDL 2.9 03/26/2021    Hepatic Function Latest Ref Rng & Units 07/08/2021 04/26/2021 03/26/2021  Total Protein 6.1 - 8.1 g/dL 7.3 - 7.8  Albumin 3.8 - 4.9 g/dL - 3.8 -  AST 10 - 35 U/L 12 - 14  ALT 9 - 46 U/L 16 - 18  Alk Phosphatase 38 - 126 U/L - - -  Total Bilirubin 0.2 - 1.2 mg/dL 0.2 - 0.2    Lab Results  Component Value Date/Time   TSH 0.73 04/15/2019 08:43 AM   TSH 0.44 03/27/2017 10:50 AM    CBC Latest Ref Rng & Units 07/08/2021 03/26/2021 12/18/2020  WBC 3.8 - 10.8 Thousand/uL 11.1(H) 9.8 11.0(H)  Hemoglobin 13.2 - 17.1 g/dL 13.1(L) 12.9(L) 13.0(L)  Hematocrit 38.5 - 50.0 % 38.8 39.3 39.3  Platelets 140 - 400 Thousand/uL 284 290 285    Lab Results  Component Value Date/Time   VD25OH 63 07/12/2020 03:39 PM   VD25OH 14 (L) 01/17/2020 10:27 AM    Clinical ASCVD: Yes  The ASCVD Risk score Mikey Bussing DC Jr., et al., 2013) failed to calculate for the following reasons:   The  patient has a prior MI or stroke diagnosis    Depression screen [facility] 2/9 07/08/2021 03/26/2021 12/18/2020  Decreased Interest 0 0 2  Down, Depressed, Hopeless 0 0 2  PHQ - 2 Score 0 0 4  Altered sleeping 0 0 1  Tired, decreased energy 0 0 2  Change in appetite 0 0 0  Feeling bad or failure about yourself  0 0 0  Trouble concentrating 0 0 1  Moving slowly or fidgety/restless 0 0 0  Suicidal thoughts 0 0 0  PHQ-9 Score 0 0 8  Difficult doing work/chores Not difficult at all Not difficult at all Somewhat difficult  Some recent data might be hidden     Social History   Tobacco Use  Smoking Status Every Day   Packs/day: 0.50   Years: 30.00    Pack years: 15.00   Types: Cigarettes  Smokeless Tobacco Never   BP Readings from Last 3 Encounters:  07/08/21 110/64  04/26/21 127/84  03/26/21 118/74   Pulse Readings from Last 3 Encounters:  07/08/21 91  04/26/21 80  03/26/21 100   Wt Readings from Last 3 Encounters:  07/08/21 220 lb 8 oz (100 kg)  04/26/21 217 lb (98.4 kg)  03/26/21 217 lb 8 oz (98.7 kg)    Assessment/Interventions: Review of patient past medical history, allergies, medications, health status, including review of consultants reports, laboratory and other test data, was performed as part of comprehensive evaluation and provision of chronic care management services.   SDOH:  (Social Determinants of Health) assessments and interventions performed: Yes    CCM Care Plan  Allergies  Allergen Reactions   No Known Allergies     Medications Reviewed Today     Reviewed by Cathrine Muster, [place] (Certified Medical Assistant) on 07/08/21 at 1446  Med List Status: <None>   Medication Order Taking? Sig Documenting Provider Last Dose Status Informant  aspirin EC 81 MG EC tablet [id number] Yes Take 1 tablet (81 mg total) by mouth daily. Hillary Bow, MD Taking Active Self  Blood Glucose Monitoring Suppl (D-CARE GLUCOMETER) w/Device KIT [id number] Yes 1 Units by Does not apply route 4 (four) times daily -  before meals and at bedtime. Hillary Bow, MD Taking Active Self  Dulaglutide (TRULICITY) 1.5 VV/6.1YW SOPN [id number] Yes Inject 1.5 mg into the skin once a week. Delsa Grana, PA-C Taking Active   Galcanezumab-gnlm [facility]) 120 MG/ML Darden Palmer [id number] Yes Inject 120 mg into the skin. 28 days [provider] Taking Active   insulin detemir (LEVEMIR FLEXTOUCH) 100 UNIT/ML FlexPen [id number] Yes Inject 15-30 Units into the skin at bedtime.  Patient taking differently: Inject 20 Units into the skin at bedtime.   Delsa Grana, PA-C Taking Active   Insulin Pen Needle (BD PEN NEEDLE MICRO U/F) 32G X 6 MM MISC  [id number] Yes USE FOR LEVEMIR INJECTION DAILY Delsa Grana, PA-C Taking Active   lisinopril (ZESTRIL) 10 MG tablet [id number] Yes TAKE 1 TABLET BY MOUTH EVERY DAY Delsa Grana, PA-C Taking Active   metFORMIN (GLUCOPHAGE) 1000 MG tablet [id number] Yes Take 1 tablet (1,000 mg total) by mouth 2 (two) times daily with a meal. Delsa Grana, PA-C Taking Active   rosuvastatin (CRESTOR) 40 MG tablet [id number] Yes Take 1 tablet (40 mg total) by mouth at bedtime. Delsa Grana, PA-C Taking Active   topiramate (TOPAMAX) 100 MG tablet [id number] Yes Take 100 mg by mouth 2 (two) times daily. [provider] Taking Active   topiramate (TOPAMAX) 50 MG tablet [id number] Yes  150 mg 2 (two) times daily. [provider] Taking Active            Med Note Kary Kos, Blair Heys   Wed Jan 18, 2020 12:20 PM)    Vitamin D, Ergocalciferol, (DRISDOL) 1.25 MG (50000 UNIT) CAPS capsule [id number] Yes Take 50,000 Units by mouth once a week. 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   Obesity (BMI 30.0-34.9) 05/27/2018   Edema of esophagus    Angiodysplasia of stomach and duodenum    Stomach irritation    Heartburn    Smoker 05/11/2018   Hypertension 09/15/2017   Hx of transient ischemic attack (TIA) 08/05/2017   Peripheral vascular insufficiency ([place]) 07/06/2017   Hyperlipidemia associated with type 2 diabetes mellitus ([place]) 10/31/2016    Immunization History  Administered Date(s) Administered   Influenza,inj,Quad PF,6+ Mos 10/31/2016, 08/27/2017, 09/29/2018, 09/27/2019   Influenza-Unspecified 10/31/2016, 08/27/2017, 09/29/2018, 09/27/2019   PFIZER(Purple Top)SARS-COV-2 Vaccination 03/10/2020, 04/04/2020   PNEUMOCOCCAL CONJUGATE-20 07/08/2021   Pneumococcal Polysaccharide-23 11/27/2017    Conditions to be  addressed/monitored:  Hypertension, Hyperlipidemia, Diabetes, Tobacco use and Chronic Migraines, and History of TIA   There are no care plans that you recently modified to display for this patient.    Medication Assistance: Application for Levemir, Trulicity  medication assistance program. in process.  Anticipated assistance start date 02/25/2021.  See plan of care for additional detail. Paperwork mailed twice to patient, left on front office desk for patient to sign as well.   [id number] [address] [place]Phone: [phone]: [phone] Uses pill box? Yes Pt endorses 100% compliance  We discussed: Current pharmacy is preferred with insurance plan and patient is satisfied with pharmacy services Patient decided to: Continue current medication management strategy  Care Plan and Follow Up Patient Decision:  Patient agrees to Care Plan and Follow-up.  Plan: No further follow up required: Patient is not CCM eligible.   [phone]

## 2021-08-28 ENCOUNTER — Other Ambulatory Visit: Payer: Self-pay | Admitting: Neurology

## 2021-08-28 DIAGNOSIS — Z8673 Personal history of transient ischemic attack (TIA), and cerebral infarction without residual deficits: Secondary | ICD-10-CM

## 2021-08-28 DIAGNOSIS — R55 Syncope and collapse: Secondary | ICD-10-CM

## 2021-09-09 ENCOUNTER — Ambulatory Visit: Payer: 59

## 2021-09-11 ENCOUNTER — Other Ambulatory Visit: Payer: Self-pay

## 2021-09-11 ENCOUNTER — Ambulatory Visit
Admission: RE | Admit: 2021-09-11 | Discharge: 2021-09-11 | Disposition: A | Discharge status: Home / Self Care | Payer: 59 | Source: Ambulatory Visit | Attending: Neurology | Admitting: Neurology

## 2021-09-11 DIAGNOSIS — R55 Syncope and collapse: Secondary | ICD-10-CM | POA: Diagnosis not present

## 2021-09-11 DIAGNOSIS — Z8673 Personal history of transient ischemic attack (TIA), and cerebral infarction without residual deficits: Secondary | ICD-10-CM | POA: Diagnosis present

## 2021-09-16 ENCOUNTER — Other Ambulatory Visit: Payer: Self-pay | Admitting: Neurology

## 2021-09-16 DIAGNOSIS — I2510 Atherosclerotic heart disease of native coronary artery without angina pectoris: Secondary | ICD-10-CM

## 2021-09-19 ENCOUNTER — Other Ambulatory Visit: Payer: Self-pay | Admitting: Family Medicine

## 2021-09-19 DIAGNOSIS — I10 Essential (primary) hypertension: Secondary | ICD-10-CM

## 2021-09-20 NOTE — Telephone Encounter (Signed)
Requested medication (s) are due for refill today: Yes  Requested medication (s) are on the active medication list: Yes  Last refill:  06/25/20  Future visit scheduled: Yes  Notes to clinic:  Prescription expired.    Requested Prescriptions  Pending Prescriptions Disp Refills   lisinopril (ZESTRIL) 10 MG tablet [Pharmacy Med Name: LISINOPRIL 10 MG TABLET] 90 tablet 3    Sig: TAKE 1 TABLET BY MOUTH EVERY DAY     Cardiovascular:  ACE Inhibitors Passed - 09/19/2021  8:45 PM      Passed - Cr in normal range and within 180 days    Creat  Date Value Ref Range Status  07/08/2021 1.09 0.70 - 1.30 mg/dL Final   Creatinine, POC  Date Value Ref Range Status  07/31/2016 NEG mg/dL Final   Creatinine, Urine  Date Value Ref Range Status  11/15/2018 349 (H) 20 - 320 mg/dL Final          Passed - K in normal range and within 180 days    Potassium  Date Value Ref Range Status  07/08/2021 4.3 3.5 - 5.3 mmol/L Final          Passed - Patient is not pregnant      Passed - Last BP in normal range    BP Readings from Last 1 Encounters:  07/08/21 110/64          Passed - Valid encounter within last 6 months    Recent Outpatient Visits           2 months ago Insulin dependent type 2 diabetes mellitus, uncontrolled (HCC)   [facility] [place], Sheliah Mends, PA-C   5 months ago Uncontrolled insulin-treated type 2 diabetes mellitus [facility])   [facility] [place], Sheliah Mends, PA-C   9 months ago Insulin dependent type 2 diabetes mellitus, uncontrolled [facility])   [facility] [place], Sheliah Mends, PA-C   1 year ago Diabetes mellitus type 2 in obese [facility])   [facility] Danelle Berry, PA-C   1 year ago Diabetes mellitus type 2 in obese [facility])   [facility] Danelle Berry, PA-C       Future Appointments             In 2 weeks Danelle Berry, PA-C [facility], [facility]

## 2021-10-02 ENCOUNTER — Ambulatory Visit: Admission: RE | Admit: 2021-10-02 | Payer: 59 | Source: Ambulatory Visit

## 2021-10-08 ENCOUNTER — Ambulatory Visit (INDEPENDENT_AMBULATORY_CARE_PROVIDER_SITE_OTHER): Payer: 59 | Admitting: Family Medicine

## 2021-10-08 ENCOUNTER — Encounter: Payer: Self-pay | Admitting: Family Medicine

## 2021-10-08 ENCOUNTER — Other Ambulatory Visit: Payer: Self-pay

## 2021-10-08 VITALS — BP 110/64 | HR 97 | Temp 98.8°F | Resp 16 | Ht 71.0 in | Wt 222.5 lb

## 2021-10-08 DIAGNOSIS — E785 Hyperlipidemia, unspecified: Secondary | ICD-10-CM

## 2021-10-08 DIAGNOSIS — I10 Essential (primary) hypertension: Secondary | ICD-10-CM

## 2021-10-08 DIAGNOSIS — Z8673 Personal history of transient ischemic attack (TIA), and cerebral infarction without residual deficits: Secondary | ICD-10-CM | POA: Diagnosis not present

## 2021-10-08 DIAGNOSIS — G8929 Other chronic pain: Secondary | ICD-10-CM

## 2021-10-08 DIAGNOSIS — F17200 Nicotine dependence, unspecified, uncomplicated: Secondary | ICD-10-CM

## 2021-10-08 DIAGNOSIS — E1169 Type 2 diabetes mellitus with other specified complication: Secondary | ICD-10-CM | POA: Diagnosis not present

## 2021-10-08 DIAGNOSIS — E669 Obesity, unspecified: Secondary | ICD-10-CM

## 2021-10-08 DIAGNOSIS — R519 Headache, unspecified: Secondary | ICD-10-CM

## 2021-10-08 LAB — POCT GLYCOSYLATED HEMOGLOBIN (HGB A1C): Hemoglobin A1C: 6.9 % — AB (ref 4.0–5.6)

## 2021-10-08 NOTE — Assessment & Plan Note (Signed)
Low normal today but without orthostatic sx. Will have patient monitor at home and alert for any hypotensive episodes. No med changes today.

## 2021-10-08 NOTE — Progress Notes (Signed)
    SUBJECTIVE:   CHIEF COMPLAINT / HPI:   Hypertension, OSA: - Medications: lisinopril - Compliance: good - Checking BP at home: yes, at the store. Doesn't remember numbers.  - Denies any LE edema, medication SEs, or symptoms of hypotension  Diabetes, Type 2 - Last A1c 7.0 07/2021 - Medications: metformin, trulicity, levemir - Compliance: good. Taking 18u of levemir. - Checking BG at home: yes, 120s usually, 120-140s. - Eye exam: UTD - Foot exam: UTD - Statin: yes - PNA vaccine: UTD - Denies symptoms of hypoglycemia, polyuria, polydipsia, numbness extremities, foot ulcers/trauma  HLD - medications: crestor - compliance: good - medication SEs: none  Migraine - follows with Neuro, last visit 9/23. Increased topamax at that time but reports neuro then decreased, no documentation of this. Currently taking 100mg  twice daily. Also on emgality. Changed ubrelvy to nurtec for rescue. Having daily headaches but not as intense. Taking nurtec maybe once per week.  Prior syncope - over a year ago. Follows with neuro and cardiology for w/u. negative EEG. MRI as below. Cardiology planning for holter monitor, ECHO, myoview. Reports no further episodes.   Cerebellar infarct - visualized on MRI 03/2021, new since 2020. F/u MRA normal. No showed CTA neck appt 11/2.  OBJECTIVE:   BP 110/64   Pulse 97   Temp 98.8 F (37.1 C) (Oral)   Resp 16   Ht 5\' 11"  (1.803 m)   Wt 222 lb 8 oz (100.9 kg)   SpO2 98%   BMI 31.03 kg/m   Gen: well appearing, in NAD Card: RRR Lungs: CTAB Ext: WWP, no edema  ASSESSMENT/PLAN:   Hypertension Low normal today but without orthostatic sx. Will have patient monitor at home and alert for any hypotensive episodes. No med changes today.  Diabetes mellitus type 2 in obese (HCC) Recheck a1c and adjust as indicated.  Hyperlipidemia associated with type 2 diabetes mellitus (HCC) Tolerant of statin, continue  Smoker Recommend cessation  Chronic  intractable headache No changes in symptoms. Continue to follow with neuro, recommend clarifying topamax dosing,  patient currently taking lower than suggested.      13/2, DO

## 2021-10-08 NOTE — Patient Instructions (Signed)
It was great to see you!  Our plans for today:  - No changes to your medications today.  - Call Neurology to double check your topamax dose.  - Continue to see the Cardiologist and Neurology.  - Come back in 6 months.   We are checking some labs today, we will release these results to your MyChart.  Take care and seek immediate care sooner if you develop any concerns.   Dr. Linwood Dibbles

## 2021-10-08 NOTE — Assessment & Plan Note (Addendum)
No changes in symptoms. Continue to follow with neuro, recommend clarifying topamax dosing,  patient currently taking lower than suggested.

## 2021-10-08 NOTE — Assessment & Plan Note (Signed)
Recommend cessation. ?

## 2021-10-08 NOTE — Assessment & Plan Note (Signed)
Recheck a1c and adjust as indicated. 

## 2021-10-08 NOTE — Assessment & Plan Note (Signed)
Tolerant of statin, continue. 

## 2021-10-23 DIAGNOSIS — E669 Obesity, unspecified: Secondary | ICD-10-CM | POA: Diagnosis not present

## 2021-10-23 DIAGNOSIS — Z720 Tobacco use: Secondary | ICD-10-CM | POA: Diagnosis not present

## 2021-10-23 DIAGNOSIS — G43909 Migraine, unspecified, not intractable, without status migrainosus: Secondary | ICD-10-CM | POA: Diagnosis not present

## 2021-10-23 DIAGNOSIS — E1142 Type 2 diabetes mellitus with diabetic polyneuropathy: Secondary | ICD-10-CM | POA: Diagnosis not present

## 2021-10-23 DIAGNOSIS — G8929 Other chronic pain: Secondary | ICD-10-CM | POA: Diagnosis not present

## 2021-10-23 DIAGNOSIS — I10 Essential (primary) hypertension: Secondary | ICD-10-CM | POA: Diagnosis not present

## 2021-10-23 DIAGNOSIS — E785 Hyperlipidemia, unspecified: Secondary | ICD-10-CM | POA: Diagnosis not present

## 2021-10-23 DIAGNOSIS — Z8673 Personal history of transient ischemic attack (TIA), and cerebral infarction without residual deficits: Secondary | ICD-10-CM | POA: Diagnosis not present

## 2021-10-23 DIAGNOSIS — Z794 Long term (current) use of insulin: Secondary | ICD-10-CM | POA: Diagnosis not present

## 2021-10-23 DIAGNOSIS — Z7984 Long term (current) use of oral hypoglycemic drugs: Secondary | ICD-10-CM | POA: Diagnosis not present

## 2021-10-23 DIAGNOSIS — G3184 Mild cognitive impairment, so stated: Secondary | ICD-10-CM | POA: Diagnosis not present

## 2021-10-23 DIAGNOSIS — Z7982 Long term (current) use of aspirin: Secondary | ICD-10-CM | POA: Diagnosis not present

## 2021-10-23 DIAGNOSIS — Z6832 Body mass index (BMI) 32.0-32.9, adult: Secondary | ICD-10-CM | POA: Diagnosis not present

## 2021-10-23 DIAGNOSIS — N529 Male erectile dysfunction, unspecified: Secondary | ICD-10-CM | POA: Diagnosis not present

## 2021-12-13 ENCOUNTER — Other Ambulatory Visit: Payer: Self-pay | Admitting: Family Medicine

## 2021-12-13 DIAGNOSIS — E1151 Type 2 diabetes mellitus with diabetic peripheral angiopathy without gangrene: Secondary | ICD-10-CM

## 2021-12-13 DIAGNOSIS — E1165 Type 2 diabetes mellitus with hyperglycemia: Secondary | ICD-10-CM

## 2021-12-13 NOTE — Telephone Encounter (Signed)
Requested Prescriptions  Pending Prescriptions Disp Refills   TRULICITY 1.5 MG/0.5ML SOPN [Pharmacy Med Name: TRULICITY 1.5 MG/0.5 ML PEN]  3    Sig: INJECT 1.5 MG INTO THE SKIN ONCE A WEEK.     Endocrinology:  Diabetes - GLP-1 Receptor Agonists Passed - 12/13/2021  1:36 PM      Passed - HBA1C is between 0 and 7.9 and within 180 days    Hemoglobin A1C  Date Value Ref Range Status  10/08/2021 6.9 (A) 4.0 - 5.6 % Final   Hgb A1c MFr Bld  Date Value Ref Range Status  07/08/2021 7.0 (H) <5.7 % of total Hgb Final    Comment:    For someone without known diabetes, a hemoglobin A1c value of 6.5% or greater indicates that they may have  diabetes and this should be confirmed with a follow-up  test. . For someone with known diabetes, a value <7% indicates  that their diabetes is well controlled and a value  greater than or equal to 7% indicates suboptimal  control. A1c targets should be individualized based on  duration of diabetes, age, comorbid conditions, and  other considerations. . Currently, no consensus exists regarding use of hemoglobin A1c for diagnosis of diabetes for children. Verna Czech - Valid encounter within last 6 months    Recent Outpatient Visits          2 months ago Diabetes mellitus type 2 in obese [facility])   [facility] [place] M, DO   5 months ago Insulin dependent type 2 diabetes mellitus, uncontrolled [facility])   [facility] [place], Sheliah Mends, PA-C   8 months ago Uncontrolled insulin-treated type 2 diabetes mellitus [facility])   [facility] [place], Sheliah Mends, PA-C   12 months ago Insulin dependent type 2 diabetes mellitus, uncontrolled [facility])   [facility] Danelle Berry, PA-C   1 year ago Diabetes mellitus type 2 in obese [facility])   [facility] Danelle Berry, PA-C      Future Appointments            In 3 months Danelle Berry, PA-C [facility], [facility]

## 2021-12-23 DIAGNOSIS — I6523 Occlusion and stenosis of bilateral carotid arteries: Secondary | ICD-10-CM | POA: Diagnosis not present

## 2021-12-23 DIAGNOSIS — I2510 Atherosclerotic heart disease of native coronary artery without angina pectoris: Secondary | ICD-10-CM | POA: Diagnosis not present

## 2021-12-23 DIAGNOSIS — Z8673 Personal history of transient ischemic attack (TIA), and cerebral infarction without residual deficits: Secondary | ICD-10-CM | POA: Diagnosis not present

## 2021-12-23 DIAGNOSIS — H4710 Unspecified papilledema: Secondary | ICD-10-CM | POA: Diagnosis not present

## 2021-12-23 DIAGNOSIS — G43719 Chronic migraine without aura, intractable, without status migrainosus: Secondary | ICD-10-CM | POA: Diagnosis not present

## 2021-12-23 DIAGNOSIS — E1169 Type 2 diabetes mellitus with other specified complication: Secondary | ICD-10-CM | POA: Diagnosis not present

## 2021-12-23 DIAGNOSIS — E669 Obesity, unspecified: Secondary | ICD-10-CM | POA: Diagnosis not present

## 2021-12-26 ENCOUNTER — Other Ambulatory Visit: Payer: Self-pay

## 2021-12-26 ENCOUNTER — Other Ambulatory Visit: Payer: Self-pay | Admitting: Student

## 2021-12-26 DIAGNOSIS — G43719 Chronic migraine without aura, intractable, without status migrainosus: Secondary | ICD-10-CM

## 2022-01-09 ENCOUNTER — Ambulatory Visit
Admission: RE | Admit: 2022-01-09 | Discharge: 2022-01-09 | Disposition: A | Discharge status: Home / Self Care | Payer: Medicare Other | Source: Ambulatory Visit | Attending: Student | Admitting: Student

## 2022-01-09 ENCOUNTER — Other Ambulatory Visit: Payer: Self-pay

## 2022-01-09 DIAGNOSIS — G43719 Chronic migraine without aura, intractable, without status migrainosus: Secondary | ICD-10-CM | POA: Insufficient documentation

## 2022-01-09 LAB — POCT I-STAT CREATININE: Creatinine, Ser: 1 mg/dL (ref 0.61–1.24)

## 2022-01-09 MED ORDER — IOHEXOL 350 MG/ML SOLN
75.0000 mL | Freq: Once | INTRAVENOUS | Status: AC | PRN
  Administered 2022-01-09: 75 mL via INTRAVENOUS

## 2022-01-17 ENCOUNTER — Ambulatory Visit (INDEPENDENT_AMBULATORY_CARE_PROVIDER_SITE_OTHER): Payer: Medicare Other | Admitting: Vascular Surgery

## 2022-01-17 ENCOUNTER — Encounter (INDEPENDENT_AMBULATORY_CARE_PROVIDER_SITE_OTHER): Payer: Self-pay | Admitting: Vascular Surgery

## 2022-01-17 ENCOUNTER — Other Ambulatory Visit: Payer: Self-pay

## 2022-01-17 VITALS — BP 118/71 | HR 116 | Ht 71.0 in | Wt 231.0 lb

## 2022-01-17 DIAGNOSIS — E119 Type 2 diabetes mellitus without complications: Secondary | ICD-10-CM

## 2022-01-17 DIAGNOSIS — I6523 Occlusion and stenosis of bilateral carotid arteries: Secondary | ICD-10-CM | POA: Diagnosis not present

## 2022-01-17 DIAGNOSIS — I6529 Occlusion and stenosis of unspecified carotid artery: Secondary | ICD-10-CM | POA: Insufficient documentation

## 2022-01-17 DIAGNOSIS — E1169 Type 2 diabetes mellitus with other specified complication: Secondary | ICD-10-CM

## 2022-01-17 DIAGNOSIS — I10 Essential (primary) hypertension: Secondary | ICD-10-CM | POA: Diagnosis not present

## 2022-01-17 DIAGNOSIS — E669 Obesity, unspecified: Secondary | ICD-10-CM

## 2022-01-17 DIAGNOSIS — E785 Hyperlipidemia, unspecified: Secondary | ICD-10-CM

## 2022-01-17 MED ORDER — CLOPIDOGREL BISULFATE 75 MG PO TABS: 75.0000 mg | ORAL_TABLET | Freq: Every day | ORAL | 6 refills | Status: DC

## 2022-01-17 NOTE — Assessment & Plan Note (Signed)
lipid control important in reducing the progression of atherosclerotic disease. Continue statin therapy  

## 2022-01-17 NOTE — Assessment & Plan Note (Signed)
blood glucose control important in reducing the progression of atherosclerotic disease. Also, involved in wound healing. On appropriate medications.  

## 2022-01-17 NOTE — Assessment & Plan Note (Signed)
blood pressure control important in reducing the progression of atherosclerotic disease. On appropriate oral medications.  

## 2022-01-17 NOTE — Assessment & Plan Note (Signed)
He has had work-up including a carotid duplex and a CT angiogram which I have independently reviewed.  The carotid duplex shows velocities in the 50 to 69% range in the left carotid artery with less than 50% right carotid stenosis.  The CT angiogram was reported as a 50% stenosis of the left carotid artery which I think is significantly underestimated by the official report.  I would estimate this closer to 65 to 70% by my interpretation.  The right carotid stenosis was less than 50%. We had a long talk today regarding treatment options.  He has evidence of an old left-sided infarct although it appears to be more posterior circulation.  Given these findings, consideration for intervention is reasonable but he is borderline for whether or not he needs treatment.  I think a reasonable option would be a carotid angiogram to more precisely assess the degree of stenosis, and if indeed greater than 60% stenosis is present in a patient who is symptomatic I think treatment would be reasonable.  Another option would be to increase his medical regimen adding Plavix and do a short interval follow-up of duplex.  This would be the most conservative way.  A third option would be proceeding straight to left carotid endarterectomy which would be the most aggressive option.  He wants to go home and discuss this with his wife.  For now, I will add Plavix and at least schedule a follow-up of 2 to 3 months and he will contact our office if he decides to have an angiogram and possible intervention.

## 2022-01-17 NOTE — Progress Notes (Signed)
Patient ID: Donald [name], male   DOB: 09-Nov-1966, 56 y.o.   MRN: [id number]  Chief Complaint  Patient presents with   New Patient (Initial Visit)    NP shah carotid/consult. Carotid. dz    HPI Donald [name] is a 56 y.o. male.  I am asked to see the patient by Dr. Manuella Ghazi for evaluation of carotid stenosis.  The patient reports having had previous stroke, visual changes, and a syncopal episode.  He has had work-up including a carotid duplex and a CT angiogram which I have independently reviewed.  The carotid duplex shows velocities in the 50 to 69% range in the left carotid artery with less than 50% right carotid stenosis.  The CT angiogram was reported as a 50% stenosis of the left carotid artery which I think is significantly underestimated by the official report.  I would estimate this closer to 65 to 70% by my interpretation.  The right carotid stenosis was less than 50%.   Past Medical History:  Diagnosis Date   Abdominal pain, epigastric    Diabetes mellitus without complication ([place])    Gout    R foot great toe    Hypercholesteremia 2017   Hypertension     Past Surgical History:  Procedure Laterality Date   ESOPHAGOGASTRODUODENOSCOPY (EGD) WITH PROPOFOL N/A 05/26/2018   Procedure: ESOPHAGOGASTRODUODENOSCOPY (EGD) WITH PROPOFOL;  Surgeon: Virgel Manifold, MD;  Location: [facility] ENDOSCOPY;  Service: Endoscopy;  Laterality: N/A;   KNEE SURGERY Right     Family History  Problem Relation Age of Onset   Hypertension Father    Diabetes Father    Liver disease Father    Diverticulitis Mother    Migraines Mother    Cancer Sister        tumors in the breast    Diabetes Maternal Grandfather    Cancer Sister        tumors in the breast    Cancer Sister        tumor in the breast       Social History   Tobacco Use   Smoking status: Every Day    Packs/day: 0.50    Years: 30.00    Pack years: 15.00    Types: Cigarettes   Smokeless tobacco: Never  Vaping Use    Vaping Use: Never used  Substance Use Topics   Alcohol use: Yes    Alcohol/week: 0.0 standard drinks    Comment: none in the last 3 weeks   Drug use: No    Allergies  Allergen Reactions   No Known Allergies     Current Outpatient Medications  Medication Sig Dispense Refill   aspirin EC 81 MG EC tablet Take 1 tablet (81 mg total) by mouth daily.     Blood Glucose Monitoring Suppl (D-CARE GLUCOMETER) w/Device KIT 1 Units by Does not apply route 4 (four) times daily -  before meals and at bedtime. 1 kit 0   Galcanezumab-gnlm (EMGALITY) 120 MG/ML SOAJ Inject 120 mg into the skin. 28 days     insulin detemir (LEVEMIR FLEXTOUCH) 100 UNIT/ML FlexPen Inject 15-30 Units into the skin at bedtime. (Patient taking differently: Inject 20 Units into the skin at bedtime.) 15 mL 3   Insulin Pen Needle (BD PEN NEEDLE MICRO U/F) 32G X 6 MM MISC USE FOR LEVEMIR INJECTION DAILY 100 each 5   lisinopril (ZESTRIL) 10 MG tablet TAKE 1 TABLET BY MOUTH EVERY DAY 90 tablet 3   metFORMIN (GLUCOPHAGE) 1000  MG tablet Take 1 tablet (1,000 mg total) by mouth 2 (two) times daily with a meal. 180 tablet 3   NURTEC 75 MG TBDP Take 1 tablet by mouth as directed.     rosuvastatin (CRESTOR) 40 MG tablet Take 1 tablet (40 mg total) by mouth at bedtime. 90 tablet 3   topiramate (TOPAMAX) 50 MG tablet 150 mg 2 (two) times daily.     TRULICITY 1.5 ZO/1.0RU SOPN INJECT 1.5 MG INTO THE SKIN ONCE A WEEK. 6 mL 3   No current facility-administered medications for this visit.      REVIEW OF SYSTEMS (Negative unless checked)  Constitutional: [] Weight loss  [] Fever  [] Chills Cardiac: [] Chest pain   [] Chest pressure   [] Palpitations   [] Shortness of breath when laying flat   [] Shortness of breath at rest   [] Shortness of breath with exertion. Vascular:  [] Pain in legs with walking   [] Pain in legs at rest   [] Pain in legs when laying flat   [] Claudication   [] Pain in feet when walking  [] Pain in feet at rest  [] Pain in feet when  laying flat   [] History of DVT   [] Phlebitis   [] Swelling in legs   [] Varicose veins   [] Non-healing ulcers Pulmonary:   [] Uses home oxygen   [] Productive cough   [] Hemoptysis   [] Wheeze  [] COPD   [] Asthma Neurologic:  [] Dizziness  [x] Blackouts   [] Seizures   [x] History of stroke   [] History of TIA  [] Aphasia   [] Temporary blindness   [] Dysphagia   [] Weakness or numbness in arms   [] Weakness or numbness in legs Musculoskeletal:  [x] Arthritis   [] Joint swelling   [x] Joint pain   [] Low back pain Hematologic:  [] Easy bruising  [] Easy bleeding   [] Hypercoagulable state   [] Anemic  [] Hepatitis Gastrointestinal:  [] Blood in stool   [] Vomiting blood  [] Gastroesophageal reflux/heartburn   [] Abdominal pain Genitourinary:  [] Chronic kidney disease   [] Difficult urination  [] Frequent urination  [] Burning with urination   [] Hematuria Skin:  [] Rashes   [] Ulcers   [] Wounds Psychological:  [] History of anxiety   []  History of major depression.    Physical Exam BP 118/71    Pulse (!) 116    Ht 5' 11"  (1.803 m)    Wt 231 lb (104.8 kg)    BMI 32.22 kg/m  Gen:  WD/WN, NAD Head: [place]/AT, No temporalis wasting. Ear/Nose/Throat: Hearing grossly intact, nares w/o erythema or drainage, oropharynx w/o Erythema/Exudate Eyes: Conjunctiva clear, sclera non-icteric  Neck: trachea midline.  Bruit left carotid Pulmonary:  Good air movement, clear to auscultation bilaterally.  Cardiac: RRR, normal S1, S2 Vascular:  Vessel Right Left  Radial Palpable Palpable           Musculoskeletal: M/S 5/5 throughout.  Extremities without ischemic changes.  No deformity or atrophy.  Trace lower extremity edema. Neurologic: Sensation grossly intact in extremities.  Symmetrical.  Speech is fluent. Motor exam as listed above. Psychiatric: Judgment intact, Mood & affect appropriate for pt's clinical situation. Dermatologic: No rashes or ulcers noted.  No cellulitis or open wounds. Lymph : No Cervical, Axillary, or Inguinal  lymphadenopathy.   Radiology CT ANGIO NECK W OR WO CONTRAST  Result Date: 01/11/2022 CLINICAL DATA:  Headaches for 2 years with dizziness. EXAM: CT ANGIOGRAPHY NECK TECHNIQUE: Multidetector CT imaging of the neck was performed using the standard protocol during bolus administration of intravenous contrast. Multiplanar CT image reconstructions and MIPs were obtained to evaluate the vascular anatomy. Carotid stenosis measurements (when  applicable) are obtained utilizing NASCET criteria, using the distal internal carotid diameter as the denominator. RADIATION DOSE REDUCTION: This exam was performed according to the departmental dose-optimization program which includes automated exposure control, adjustment of the mA and/or kV according to patient size and/or use of iterative reconstruction technique. CONTRAST:  26m OMNIPAQUE IOHEXOL 350 MG/ML SOLN COMPARISON:  Carotid Doppler 09/11/2021 FINDINGS: Aortic arch: Atheromatous wall thickening. No aneurysm. Three vessel branching Right carotid system: Low-density plaque/intimal thickening diffusely along the common carotid. Mildly accentuated plaque at the bifurcation and proximal ICA without stenosis or ulceration. Negative for beading. Left carotid system: Low-density atheromatous wall thickening of the common carotid. Mixed density plaque at the bifurcation and proximal ICA. Proximal ICA stenosis measures up to 50% as measured on sagittal reformats. No ulceration or bleeding. Vertebral arteries: No proximal subclavian stenosis. Right vertebral artery dominance. Calcified plaque at both vertebral ostia. The right vertebral artery is widely patent to the dura. Scattered atheromatous calcification along the left V1 segment with estimated maximal 30% narrowing as measured on coronal reformats. Skeleton: Cervical spine degeneration which is ordinary/generalized. Other neck: No acute finding. Upper chest: Paraseptal emphysema and mild bilateral airway thickening.  IMPRESSION: 1. Atherosclerosis with up to 50% proximal left ICA stenosis. 2. Vertebral atherosclerosis without evidence of flow limiting stenosis. Electronically Signed   By: JJorje GuildM.D.   On: 01/11/2022 06:31    Labs Recent Results (from the past 2160 hour(s))  I-STAT creatinine     Status: None   Collection Time: 01/09/22  3:46 PM  Result Value Ref Range   Creatinine, Ser 1.00 0.61 - 1.24 mg/dL    Assessment/Plan:  No problem-specific Assessment & Plan notes found for this encounter.      JLeotis Pain2/17/2023, 11:08 AM   This note was created with Dragon medical transcription system.  Any errors from dictation are unintentional.

## 2022-02-20 LAB — HM DIABETES EYE EXAM

## 2022-02-21 ENCOUNTER — Other Ambulatory Visit: Payer: Self-pay | Admitting: Family Medicine

## 2022-02-21 DIAGNOSIS — E1151 Type 2 diabetes mellitus with diabetic peripheral angiopathy without gangrene: Secondary | ICD-10-CM

## 2022-02-21 DIAGNOSIS — E1165 Type 2 diabetes mellitus with hyperglycemia: Secondary | ICD-10-CM

## 2022-02-24 NOTE — Telephone Encounter (Signed)
Transmission failed- need to resend ?

## 2022-02-26 ENCOUNTER — Encounter: Payer: Self-pay | Admitting: Family Medicine

## 2022-02-26 ENCOUNTER — Telehealth: Payer: Self-pay | Admitting: Family Medicine

## 2022-02-26 DIAGNOSIS — E1165 Type 2 diabetes mellitus with hyperglycemia: Secondary | ICD-10-CM

## 2022-02-26 DIAGNOSIS — E1151 Type 2 diabetes mellitus with diabetic peripheral angiopathy without gangrene: Secondary | ICD-10-CM

## 2022-02-26 NOTE — Telephone Encounter (Signed)
Please resend is Levemir. Transmission to pharmacy failed  ?

## 2022-02-26 NOTE — Telephone Encounter (Signed)
Pt is calling back requesting a follow up on medication. ? ?Pt is requesting a call back today if possible.  ? ? ?

## 2022-02-27 ENCOUNTER — Telehealth: Payer: Self-pay

## 2022-02-27 NOTE — Telephone Encounter (Signed)
Copied from CRM [id number]. Topic: General - Call Back - No Documentation >> Feb 27, 2022  9:16 AM Donald [name] wrote: Reason for CRM: Pt stated he just had a missed call from the office. Pt requests call back.

## 2022-02-27 NOTE — Telephone Encounter (Signed)
Called pharmacy spoke to Halls and she received verbal orders for rx of Levemir.  ?

## 2022-02-27 NOTE — Telephone Encounter (Signed)
Informed pt his rx Levemir has been fixed with his pharmacy. ?

## 2022-03-18 ENCOUNTER — Ambulatory Visit (INDEPENDENT_AMBULATORY_CARE_PROVIDER_SITE_OTHER): Payer: Medicare Other | Admitting: Vascular Surgery

## 2022-03-18 ENCOUNTER — Encounter (INDEPENDENT_AMBULATORY_CARE_PROVIDER_SITE_OTHER): Payer: Self-pay | Admitting: Vascular Surgery

## 2022-03-18 ENCOUNTER — Ambulatory Visit (INDEPENDENT_AMBULATORY_CARE_PROVIDER_SITE_OTHER): Payer: Medicare Other

## 2022-03-18 VITALS — BP 132/88 | HR 94 | Resp 18 | Ht 71.0 in | Wt 228.0 lb

## 2022-03-18 DIAGNOSIS — I10 Essential (primary) hypertension: Secondary | ICD-10-CM | POA: Diagnosis not present

## 2022-03-18 DIAGNOSIS — I6523 Occlusion and stenosis of bilateral carotid arteries: Secondary | ICD-10-CM | POA: Diagnosis not present

## 2022-03-18 DIAGNOSIS — E669 Obesity, unspecified: Secondary | ICD-10-CM | POA: Diagnosis not present

## 2022-03-18 DIAGNOSIS — E1169 Type 2 diabetes mellitus with other specified complication: Secondary | ICD-10-CM | POA: Diagnosis not present

## 2022-03-18 DIAGNOSIS — E785 Hyperlipidemia, unspecified: Secondary | ICD-10-CM

## 2022-03-18 NOTE — Assessment & Plan Note (Signed)
blood pressure control important in reducing the progression of atherosclerotic disease. On appropriate oral medications.  

## 2022-03-18 NOTE — Assessment & Plan Note (Signed)
blood glucose control important in reducing the progression of atherosclerotic disease. Also, involved in wound healing. On appropriate medications.  

## 2022-03-18 NOTE — Assessment & Plan Note (Signed)
lipid control important in reducing the progression of atherosclerotic disease. Continue statin therapy  

## 2022-03-18 NOTE — Assessment & Plan Note (Signed)
Carotid duplex today reveals velocities at least on the upper end of the 1 to 39% range on the right as well as the upper end of the 40 to 59% range on the left which is stable from his previous studies and CT scan on previous visits. ?Continue current medical regimen with aspirin, Plavix, and statin agent.  No role for intervention at this time.  Recheck in 6 months. ?

## 2022-03-18 NOTE — Progress Notes (Signed)
? ? ?MRN : [id number] ? ?Donald [name] is a 56 y.o. (October 16, 1966) male who presents with chief complaint of  ?Chief Complaint  ?Patient presents with  ? Follow-up  ?  ultrasound  ?. ? ?History of Present Illness: Patient returns in follow-up of his carotid disease.  He is doing well today.  No focal neurologic symptoms. Specifically, the patient denies amaurosis fugax, speech or swallowing difficulties, or arm or leg weakness or numbness.  Carotid duplex today reveals velocities at least on the upper end of the 1 to 39% range on the right as well as the upper end of the 40 to 59% range on the left which is stable from his previous studies and CT scan on previous visits. ? ?Current Outpatient Medications  ?Medication Sig Dispense Refill  ? aspirin EC 81 MG EC tablet Take 1 tablet (81 mg total) by mouth daily.    ? Blood Glucose Monitoring Suppl (D-CARE GLUCOMETER) w/Device KIT 1 Units by Does not apply route 4 (four) times daily -  before meals and at bedtime. 1 kit 0  ? clopidogrel (PLAVIX) 75 MG tablet Take 1 tablet (75 mg total) by mouth daily. 30 tablet 6  ? Galcanezumab-gnlm (EMGALITY) 120 MG/ML SOAJ Inject 120 mg into the skin. 28 days    ? Insulin Pen Needle (BD PEN NEEDLE MICRO U/F) 32G X 6 MM MISC USE FOR LEVEMIR INJECTION DAILY 100 each 5  ? LEVEMIR FLEXTOUCH 100 UNIT/ML FlexTouch Pen INJECT 15-30 UNITS INTO THE SKIN AT BEDTIME. 15 mL 3  ? lisinopril (ZESTRIL) 10 MG tablet TAKE 1 TABLET BY MOUTH EVERY DAY 90 tablet 3  ? metFORMIN (GLUCOPHAGE) 1000 MG tablet Take 1 tablet (1,000 mg total) by mouth 2 (two) times daily with a meal. 180 tablet 3  ? NURTEC 75 MG TBDP Take 1 tablet by mouth as directed.    ? rosuvastatin (CRESTOR) 40 MG tablet Take 1 tablet (40 mg total) by mouth at bedtime. 90 tablet 3  ? topiramate (TOPAMAX) 50 MG tablet 150 mg 2 (two) times daily.    ? TRULICITY 1.5 PV/3.7SM SOPN INJECT 1.5 MG INTO THE SKIN ONCE A WEEK. 6 mL 3  ? ?No current facility-administered medications for this visit.   ? ? ?Past Medical History:  ?Diagnosis Date  ? Abdominal pain, epigastric   ? Diabetes mellitus without complication ([place])   ? Gout   ? R foot great toe   ? Hypercholesteremia 2017  ? Hypertension   ? ? ?Past Surgical History:  ?Procedure Laterality Date  ? ESOPHAGOGASTRODUODENOSCOPY (EGD) WITH PROPOFOL N/A 05/26/2018  ? Procedure: ESOPHAGOGASTRODUODENOSCOPY (EGD) WITH PROPOFOL;  Surgeon: Virgel Manifold, MD;  Location: [facility] ENDOSCOPY;  Service: Endoscopy;  Laterality: N/A;  ? KNEE SURGERY Right   ? ? ? ?Social History  ? ?Tobacco Use  ? Smoking status: Every Day  ?  Packs/day: 0.50  ?  Years: 30.00  ?  Pack years: 15.00  ?  Types: Cigarettes  ? Smokeless tobacco: Never  ?Vaping Use  ? Vaping Use: Never used  ?Substance Use Topics  ? Alcohol use: Yes  ?  Alcohol/week: 0.0 standard drinks  ?  Comment: none in the last 3 weeks  ? Drug use: No  ? ? ? ? ?Family History  ?Problem Relation Age of Onset  ? Hypertension Father   ? Diabetes Father   ? Liver disease Father   ? Diverticulitis Mother   ? Migraines Mother   ? Cancer Sister   ?  tumors in the breast   ? Diabetes Maternal Grandfather   ? Cancer Sister   ?     tumors in the breast   ? Cancer Sister   ?     tumor in the breast   ? ? ? ?Allergies  ?Allergen Reactions  ? No Known Allergies   ? ? ?REVIEW OF SYSTEMS (Negative unless checked) ?  ?Constitutional: [] Weight loss  [] Fever  [] Chills ?Cardiac: [] Chest pain   [] Chest pressure   [] Palpitations   [] Shortness of breath when laying flat   [] Shortness of breath at rest   [] Shortness of breath with exertion. ?Vascular:  [] Pain in legs with walking   [] Pain in legs at rest   [] Pain in legs when laying flat   [] Claudication   [] Pain in feet when walking  [] Pain in feet at rest  [] Pain in feet when laying flat   [] History of DVT   [] Phlebitis   [] Swelling in legs   [] Varicose veins   [] Non-healing ulcers ?Pulmonary:   [] Uses home oxygen   [] Productive cough   [] Hemoptysis   [] Wheeze  [] COPD    [] Asthma ?Neurologic:  [] Dizziness  [x] Blackouts   [] Seizures   [x] History of stroke   [] History of TIA  [] Aphasia   [] Temporary blindness   [] Dysphagia   [] Weakness or numbness in arms   [] Weakness or numbness in legs ?Musculoskeletal:  [x] Arthritis   [] Joint swelling   [x] Joint pain   [] Low back pain ?Hematologic:  [] Easy bruising  [] Easy bleeding   [] Hypercoagulable state   [] Anemic  [] Hepatitis ?Gastrointestinal:  [] Blood in stool   [] Vomiting blood  [] Gastroesophageal reflux/heartburn   [] Abdominal pain ?Genitourinary:  [] Chronic kidney disease   [] Difficult urination  [] Frequent urination  [] Burning with urination   [] Hematuria ?Skin:  [] Rashes   [] Ulcers   [] Wounds ?Psychological:  [] History of anxiety   []  History of major depression. ? ? ?Physical Examination ? ?Vitals:  ? 03/18/22 1116 03/18/22 1121  ?BP: 130/73 132/88  ?Pulse: 94   ?Resp: 18   ?Weight: 228 lb (103.4 kg)   ?Height: 5' 11"  (1.803 m)   ? ?Body mass index is 31.8 kg/m?. ?Gen:  WD/WN, NAD ?Head: [place]/AT, No temporalis wasting. ?Ear/Nose/Throat: Hearing grossly intact, nares w/o erythema or drainage, trachea midline ?Eyes: Conjunctiva clear. Sclera non-icteric ?Neck: Supple.  Soft left bruit  ?Pulmonary:  Good air movement, equal and clear to auscultation bilaterally.  ?Cardiac: RRR, No JVD ?Vascular:  ?Vessel Right Left  ?Radial Palpable Palpable  ?    ? ?Musculoskeletal: M/S 5/5 throughout.  No deformity or atrophy.  No edema. ?Neurologic: CN 2-12 intact. Sensation grossly intact in extremities.  Symmetrical.  Speech is fluent. Motor exam as listed above. ?Psychiatric: Judgment intact, Mood & affect appropriate for pt's clinical situation. ?Dermatologic: No rashes or ulcers noted.  No cellulitis or open wounds. ? ? ? ? ?CBC ?Lab Results  ?Component Value Date  ? WBC 11.1 (H) 07/08/2021  ? HGB 13.1 (L) 07/08/2021  ? HCT 38.8 07/08/2021  ? MCV 92.2 07/08/2021  ? PLT 284 07/08/2021  ? ? ?BMET ?   ?Component Value Date/Time  ? NA 141 07/08/2021  1522  ? K 4.3 07/08/2021 1522  ? CL 107 07/08/2021 1522  ? CO2 26 07/08/2021 1522  ? GLUCOSE 121 (H) 07/08/2021 1522  ? BUN 15 07/08/2021 1522  ? CREATININE 1.00 01/09/2022 1546  ? CREATININE 1.09 07/08/2021 1522  ? CALCIUM 10.0 07/08/2021 1522  ? GFRNONAA 65 03/26/2021 1146  ?  GFRAA 76 03/26/2021 1146  ? ?CrCl cannot be calculated (Patient's most recent lab result is older than the maximum 21 days allowed.). ? ?COAG ?No results found for: INR, PROTIME ? ?Radiology ?No results found. ? ? ?Assessment/Plan ?Hypertension ?blood pressure control important in reducing the progression of atherosclerotic disease. On appropriate oral medications. ? ? ?Diabetes mellitus type 2 in obese [facility]) ?blood glucose control important in reducing the progression of atherosclerotic disease. Also, involved in wound healing. On appropriate medications. ? ? ?Hyperlipidemia associated with type 2 diabetes mellitus ([place]) ?lipid control important in reducing the progression of atherosclerotic disease. Continue statin therapy ? ? ?Carotid stenosis ?Carotid duplex today reveals velocities at least on the upper end of the 1 to 39% range on the right as well as the upper end of the 40 to 59% range on the left which is stable from his previous studies and CT scan on previous visits. ?Continue current medical regimen with aspirin, Plavix, and statin agent.  No role for intervention at this time.  Recheck in 6 months. ? ? ? ?Leotis Pain, MD ? ?03/18/2022 ?11:30 AM ? ? ? ?This note was created with Dragon medical transcription system.  Any errors from dictation are purely unintentional  ?

## 2022-04-07 ENCOUNTER — Ambulatory Visit (INDEPENDENT_AMBULATORY_CARE_PROVIDER_SITE_OTHER): Payer: Medicare Other | Admitting: Family Medicine

## 2022-04-07 ENCOUNTER — Encounter: Payer: Self-pay | Admitting: Family Medicine

## 2022-04-07 VITALS — BP 122/64 | HR 94 | Temp 98.4°F | Resp 16 | Ht 71.0 in | Wt 230.4 lb

## 2022-04-07 DIAGNOSIS — I739 Peripheral vascular disease, unspecified: Secondary | ICD-10-CM | POA: Diagnosis not present

## 2022-04-07 DIAGNOSIS — D649 Anemia, unspecified: Secondary | ICD-10-CM

## 2022-04-07 DIAGNOSIS — F17200 Nicotine dependence, unspecified, uncomplicated: Secondary | ICD-10-CM

## 2022-04-07 DIAGNOSIS — E1169 Type 2 diabetes mellitus with other specified complication: Secondary | ICD-10-CM

## 2022-04-07 DIAGNOSIS — Z5181 Encounter for therapeutic drug level monitoring: Secondary | ICD-10-CM

## 2022-04-07 DIAGNOSIS — Z7902 Long term (current) use of antithrombotics/antiplatelets: Secondary | ICD-10-CM

## 2022-04-07 DIAGNOSIS — E669 Obesity, unspecified: Secondary | ICD-10-CM

## 2022-04-07 DIAGNOSIS — G8929 Other chronic pain: Secondary | ICD-10-CM

## 2022-04-07 DIAGNOSIS — R519 Headache, unspecified: Secondary | ICD-10-CM

## 2022-04-07 DIAGNOSIS — I10 Essential (primary) hypertension: Secondary | ICD-10-CM | POA: Diagnosis not present

## 2022-04-07 DIAGNOSIS — Z6832 Body mass index (BMI) 32.0-32.9, adult: Secondary | ICD-10-CM

## 2022-04-07 DIAGNOSIS — E785 Hyperlipidemia, unspecified: Secondary | ICD-10-CM

## 2022-04-07 NOTE — Assessment & Plan Note (Signed)
Compliant with meds, no SE, no myalgias, fatigue or jaundice ?Due for LP and recheck CMP ?Continue crestor and diet/lifestyle efforts ? ?

## 2022-04-07 NOTE — Assessment & Plan Note (Signed)
Per Vascular ?On statin, plavix and asa ?No claudication sx ?

## 2022-04-07 NOTE — Assessment & Plan Note (Signed)
Reviewed and strongly encouraged smoking cessation explained how this will greatly benefit PAD, HTN, HLD management and lower his risk of heart attack/stroke ?(see HPI for smoking cessation counseling) ?

## 2022-04-07 NOTE — Assessment & Plan Note (Signed)
Monitoring H/H ?Hemoglobin  ?Date Value Ref Range Status  ?07/08/2021 13.1 (L) 13.2 - 17.1 g/dL Final  ?03/26/2021 12.9 (L) 13.2 - 17.1 g/dL Final  ?12/18/2020 13.0 (L) 13.2 - 17.1 g/dL Final  ?07/12/2020 15.2 13.2 - 17.1 g/dL Final  ? ? ?

## 2022-04-07 NOTE — Progress Notes (Signed)
? ?Name: Donald [name]   MRN: [id number]    DOB: June 09, 1966   Date:04/07/2022 ? ?     Progress Note ? ?Chief Complaint  ?Patient presents with  ? Diabetes  ?  Patient is not takingTrulicity to expensive, needs something else  ? Hyperlipidemia  ? Hypertension  ? ? ? ?Subjective:  ? ?Donald [name] is a 56 y.o. male, presents to clinic for  ? ?DM:  trulicity monthly $121 ?Pt managing DM with metformin and trulicity - ran out 2 weeks ago ?Reports good med compliance ?Pt has no SE from meds. ?Blood sugars 130's ?Denies: Polyuria, polydipsia, vision changes, neuropathy, hypoglycemia ?Recent pertinent labs: ?Lab Results  ?Component Value Date  ? HGBA1C 6.9 (A) 10/08/2021  ? HGBA1C 7.0 (H) 07/08/2021  ? HGBA1C 7.9 (H) 03/26/2021  ? ?Lab Results  ?Component Value Date  ? MICROALBUR 1.3 01/17/2020  ? [place] 54 03/26/2021  ? CREATININE 1.00 01/09/2022  ? ?Standard of care and health maintenance: ?Urine Microalbumin:  on ACEI ?Foot exam:  due today ?DM eye exam:  done ?ACEI/ARB:  yes ?Statin:  yes ? ? ?Hypertension:  ?Currently managed on lisinoptril 10 mg  ?Pt reports good med compliance and denies any SE.   ?Blood pressure today is well controlled. ?BP Readings from Last 3 Encounters:  ?04/07/22 122/64  ?03/18/22 132/88  ?01/17/22 118/71  ? ?Pt denies CP, SOB, exertional sx, LE edema, palpitation, Ha's, visual disturbances, lightheadedness, hypotension, syncope. ?Dietary efforts for BP?  none  ? ?Hyperlipidemia: ?Currently treated with crestor 40 mg, pt reports good med compliance ?Last Lipids: ?Lab Results  ?Component Value Date  ? CHOL 109 03/26/2021  ? HDL 38 (L) 03/26/2021  ? [place] 54 03/26/2021  ? TRIG 83 03/26/2021  ? CHOLHDL 2.9 03/26/2021  ? ?- Denies: Chest pain, shortness of breath, myalgias, claudication ? ?Mild anemia - monitoring ?Hemoglobin  ?Date Value Ref Range Status  ?07/08/2021 13.1 (L) 13.2 - 17.1 g/dL Final  ?03/26/2021 12.9 (L) 13.2 - 17.1 g/dL Final  ?12/18/2020 13.0 (L) 13.2 - 17.1 g/dL Final   ?07/12/2020 15.2 13.2 - 17.1 g/dL Final  ? ?PAD - sees vascular, on long term plavix + asa, statin ? ?Current 1ppd smoker ?Smoking cessation instruction/counseling given:  counseled patient on the dangers of tobacco use, advised patient to stop smoking, and reviewed strategies to maximize success ? ? ?Obesity:weight up roughly 10 lbs compared to 6 months ago ?Wt Readings from Last 5 Encounters:  ?04/07/22 230 lb 6.4 oz (104.5 kg)  ?03/18/22 228 lb (103.4 kg)  ?01/17/22 231 lb (104.8 kg)  ?10/08/21 222 lb 8 oz (100.9 kg)  ?07/08/21 220 lb 8 oz (100 kg)  ? ?BMI Readings from Last 5 Encounters:  ?04/07/22 32.13 kg/m?  ?03/18/22 31.80 kg/m?  ?01/17/22 32.22 kg/m?  ?10/08/21 31.03 kg/m?  ?07/08/21 30.75 kg/m?  ? ?Migraines - emgality injection $250 monthly, nurtec - working with neurology ? ? ?Current Outpatient Medications:  ?  aspirin EC 81 MG EC tablet, Take 1 tablet (81 mg total) by mouth daily., Disp: , Rfl:  ?  Blood Glucose Monitoring Suppl (D-CARE GLUCOMETER) w/Device KIT, 1 Units by Does not apply route 4 (four) times daily -  before meals and at bedtime., Disp: 1 kit, Rfl: 0 ?  clopidogrel (PLAVIX) 75 MG tablet, Take 1 tablet (75 mg total) by mouth daily., Disp: 30 tablet, Rfl: 6 ?  Insulin Pen Needle (BD PEN NEEDLE MICRO U/F) 32G X 6 MM MISC, USE FOR LEVEMIR INJECTION  DAILY, Disp: 100 each, Rfl: 5 ?  LEVEMIR FLEXTOUCH 100 UNIT/ML FlexTouch Pen, INJECT 15-30 UNITS INTO THE SKIN AT BEDTIME., Disp: 15 mL, Rfl: 3 ?  lisinopril (ZESTRIL) 10 MG tablet, TAKE 1 TABLET BY MOUTH EVERY DAY, Disp: 90 tablet, Rfl: 3 ?  metFORMIN (GLUCOPHAGE) 1000 MG tablet, Take 1 tablet (1,000 mg total) by mouth 2 (two) times daily with a meal., Disp: 180 tablet, Rfl: 3 ?  NURTEC 75 MG TBDP, Take 1 tablet by mouth as directed., Disp: , Rfl:  ?  rosuvastatin (CRESTOR) 40 MG tablet, Take 1 tablet (40 mg total) by mouth at bedtime., Disp: 90 tablet, Rfl: 3 ?  topiramate (TOPAMAX) 50 MG tablet, 150 mg 2 (two) times daily., Disp: , Rfl:  ?   Galcanezumab-gnlm (EMGALITY) 120 MG/ML SOAJ, Inject 120 mg into the skin. 28 days (Patient not taking: Reported on 04/07/2022), Disp: , Rfl:  ?  TRULICITY 1.5 NT/6.1WE SOPN, INJECT 1.5 MG INTO THE SKIN ONCE A WEEK. (Patient not taking: Reported on 04/07/2022), Disp: 6 mL, Rfl: 3 ? ?Patient Active Problem List  ? Diagnosis Date Noted  ? Carotid stenosis 01/17/2022  ? Syncope with normal neurologic examination 03/28/2021  ? Chronic intractable headache 02/10/2020  ? Diabetes mellitus type 2 in obese ([place]) 11/12/2018  ? 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 Obesity (BMI 30.0-34.9) 05/27/2018  ? Edema of esophagus   ? Angiodysplasia of stomach and duodenum   ? Stomach irritation   ? Heartburn   ? Smoker 05/11/2018  ? Hypertension 09/15/2017  ? Hx of transient ischemic attack (TIA) 08/05/2017  ? Peripheral vascular insufficiency ([place]) 07/06/2017  ? Hyperlipidemia associated with type 2 diabetes mellitus ([place]) 10/31/2016  ? ? ?Past Surgical History:  ?Procedure Laterality Date  ? ESOPHAGOGASTRODUODENOSCOPY (EGD) WITH PROPOFOL N/A 05/26/2018  ? Procedure: ESOPHAGOGASTRODUODENOSCOPY (EGD) WITH PROPOFOL;  Surgeon: Virgel Manifold, MD;  Location: [facility] END[facility]COPY;  Service: Endoscopy;  Laterality: N/A;  ? KNEE SURGERY Right   ? ? ?Family History  ?Problem Relation Age of Onset  ? Hypertension Father   ? Diabetes Father   ? Liver disease Father   ? Diverticulitis Mother   ? Migraines Mother   ? Cancer Sister   ?     tumors in the breast   ? Diabetes Maternal Grandfather   ? Cancer Sister   ?     tumors in the breast   ? Cancer Sister   ?     tumor in the breast   ? ? ?Social History  ? ?Tobacco Use  ? Smoking status: Every Day  ?  Packs/day: 0.50  ?  Years: 30.00  ?  Pack years: 15.00  ?  Types: Cigarettes  ? Smokeless tobacco: Never  ?Vaping Use  ? Vaping Use: Never used  ?Substance Use Topics  ? Alcohol use: Yes  ?  Alcohol/week: 0.0 standard drinks  ?  Comment: none in the last 3 weeks  ? Drug use: No  ?  ? ?Allergies   ?Allergen Reactions  ? No Known Allergies   ? ? ?Health Maintenance  ?Topic Date Due  ? Zoster Vaccines- Shingrix (1 of 2) Never done  ? COVID-19 Vaccine (3 - Booster for Pfizer series) 05/30/2020  ? FOOT EXAM  03/26/2022  ? HEMOGLOBIN A1C  04/07/2022  ? INFLUENZA VACCINE  07/01/2022  ? OPHTHALMOLOGY EXAM  02/21/2023  ? COLONOSCOPY (Pts 45-59yr Insurance coverage will need to be confirmed)  12/01/2024  ? Hepatitis C Screening  Completed  ?  HIV Screening  Completed  ? HPV VACCINES  Aged Out  ? TETANUS/TDAP  Discontinued  ? ? ?Chart Review Today: ?I personally reviewed active problem list, medication list, allergies, family history, social history, health maintenance, notes from last encounter, lab results, imaging with the patient/caregiver today. ? ? ?Review of Systems  ?Constitutional: Negative.   ?HENT: Negative.    ?Eyes: Negative.   ?Respiratory: Negative.    ?Cardiovascular: Negative.   ?Gastrointestinal: Negative.   ?Endocrine: Negative.   ?Genitourinary: Negative.   ?Musculoskeletal: Negative.   ?Skin: Negative.   ?Allergic/Immunologic: Negative.   ?Neurological: Negative.   ?Hematological: Negative.   ?Psychiatric/Behavioral: Negative.    ?All other systems reviewed and are negative. Other than noted in hpi ? ?Objective:  ? ?Vitals:  ? 04/07/22 0836  ?BP: 122/64  ?Pulse: 94  ?Resp: 16  ?Temp: 98.4 ?F (36.9 ?C)  ?SpO2: 96%  ?Weight: 230 lb 6.4 oz (104.5 kg)  ?Height: 5' 11"  (1.803 m)  ? ? ?Body mass index is 32.13 kg/m?. ? ?Physical Exam ?Vitals and nursing note reviewed.  ?Constitutional:   ?   General: He is not in acute distress. ?   Appearance: Normal appearance. He is well-developed. He is obese. He is not ill-appearing, toxic-appearing or diaphoretic.  ?HENT:  ?   Head: Normocephalic and atraumatic.  ?   Jaw: No trismus.  ?   Right Ear: External ear normal.  ?   Left Ear: External ear normal.  ?   Nose: Nose normal.  ?   Mouth/Throat:  ?   Mouth: Mucous membranes are moist.  ?   Pharynx: Oropharynx is  clear.  ?Eyes:  ?   General: Lids are normal. No scleral icterus.    ?   Right eye: No discharge.     ?   Left eye: No discharge.  ?   Conjunctiva/sclera: Conjunctivae normal.  ?Neck:  ?   Trachea: Trachea and phon

## 2022-04-07 NOTE — Assessment & Plan Note (Signed)
Per neuro ?On emgality injections - not able to afford with price increase ?

## 2022-04-07 NOTE — Assessment & Plan Note (Signed)
Well controlled, compliant with meds, no SE or concerning sx ?Recheck renal function and electrolytes ?Pt encouraged to continue to work on healthy diet (low salt) and lifestyle for improving HTN management - DASH diet handout offered today ?Continue lisinopril ? ?

## 2022-04-07 NOTE — Assessment & Plan Note (Signed)
weight up 10 lbs - encouraged healthy diet/exercise ?Multiple comorbidities HLD, HTN, DM, PAD ?

## 2022-04-07 NOTE — Assessment & Plan Note (Addendum)
DM has been well controlled with metformin, levemir and trulicity, but no longer able to afford ?Lab Results  ?Component Value Date  ? HGBA1C 6.9 (A) 10/08/2021  ? HGBA1C 7.0 (H) 07/08/2021  ? HGBA1C 7.9 (H) 03/26/2021  ? ?Standard of care and health maintenance: ?Urine Microalbumin:  on ACEI ?Foot exam:  Done today ?DM eye exam:  Done (march) ?ACEI/ARB:  yes ?Statin:  Yes ? ?Will likely have to change management due to med cost ?Will consult pharmacist for assistance ([place] [facility] - may be over income limits) ?Likely have to start other more affordable oral meds and recheck A1C in 3 months ?

## 2022-04-08 LAB — HEMOGLOBIN A1C
Hgb A1c MFr Bld: 10.6 % of total Hgb — ABNORMAL HIGH (ref <5.7)
Mean Plasma Glucose: 258 mg/dL
eAG (mmol/L): 14.3 mmol/L

## 2022-04-08 LAB — CBC WITH DIFFERENTIAL/PLATELET
Absolute Monocytes: 963 cells/uL — ABNORMAL HIGH (ref 200–950)
Basophils Absolute: 75 cells/uL (ref 0–200)
Basophils Relative: 0.7 %
Eosinophils Absolute: 96 cells/uL (ref 15–500)
Eosinophils Relative: 0.9 %
HCT: 34.6 % — ABNORMAL LOW (ref 38.5–50.0)
Hemoglobin: 11.4 g/dL — ABNORMAL LOW (ref 13.2–17.1)
Lymphs Abs: 2418 cells/uL (ref 850–3900)
MCH: 31.1 pg (ref 27.0–33.0)
MCHC: 32.9 g/dL (ref 32.0–36.0)
MCV: 94.5 fL (ref 80.0–100.0)
MPV: 12 fL (ref 7.5–12.5)
Monocytes Relative: 9 %
Neutro Abs: 7148 cells/uL (ref 1500–7800)
Neutrophils Relative %: 66.8 %
Platelets: 299 10*3/uL (ref 140–400)
RBC: 3.66 10*6/uL — ABNORMAL LOW (ref 4.20–5.80)
RDW: 12.6 % (ref 11.0–15.0)
Total Lymphocyte: 22.6 %
WBC: 10.7 10*3/uL (ref 3.8–10.8)

## 2022-04-08 LAB — LIPID PANEL
Cholesterol: 115 mg/dL (ref <200)
HDL: 40 mg/dL (ref >=40)
LDL Cholesterol (Calc): 52 mg/dL (calc)
Non-HDL Cholesterol (Calc): 75 mg/dL (calc) (ref <130)
Total CHOL/HDL Ratio: 2.9 (calc) (ref <5.0)
Triglycerides: 142 mg/dL (ref <150)

## 2022-04-08 LAB — COMPLETE METABOLIC PANEL WITH GFR
AG Ratio: 1.2 (calc) (ref 1.0–2.5)
ALT: 15 U/L (ref 9–46)
AST: 9 U/L — ABNORMAL LOW (ref 10–35)
Albumin: 4.1 g/dL (ref 3.6–5.1)
Alkaline phosphatase (APISO): 77 U/L (ref 35–144)
BUN: 16 mg/dL (ref 7–25)
CO2: 26 mmol/L (ref 20–32)
Calcium: 9.6 mg/dL (ref 8.6–10.3)
Chloride: 104 mmol/L (ref 98–110)
Creat: 1.12 mg/dL (ref 0.70–1.30)
Globulin: 3.5 g/dL (calc) (ref 1.9–3.7)
Glucose, Bld: 313 mg/dL — ABNORMAL HIGH (ref 65–99)
Potassium: 4.5 mmol/L (ref 3.5–5.3)
Sodium: 140 mmol/L (ref 135–146)
Total Bilirubin: 0.2 mg/dL (ref 0.2–1.2)
Total Protein: 7.6 g/dL (ref 6.1–8.1)
eGFR: 77 mL/min/{1.73_m2} (ref >=60)

## 2022-04-23 LAB — HM DIABETES EYE EXAM

## 2022-05-20 ENCOUNTER — Telehealth: Payer: Self-pay | Admitting: Family Medicine

## 2022-05-20 NOTE — Telephone Encounter (Signed)
Levemir rx was refilled last by Dr.Rumball approve for the same amount?

## 2022-05-20 NOTE — Telephone Encounter (Signed)
Medication Refill - Medication: LEVEMIR FLEXTOUCH 100 UNIT/ML FlexTouch Pen  Insulin Pen Needle (BD PEN NEEDLE MICRO U/F) 32G X 6 MM MISC  Has the patient contacted their pharmacy? No.  Preferred Pharmacy (with phone number or street name):  [id number] - [place], [place] [address] Phone:  [phone]  Fax:  [phone]     Has the patient been seen for an appointment in the last year OR does the patient have an upcoming appointment? Yes.    Agent: Please be advised that RX refills may take up to 3 business days. We ask that you follow-up with your pharmacy.

## 2022-05-21 ENCOUNTER — Other Ambulatory Visit: Payer: Self-pay

## 2022-05-21 DIAGNOSIS — E119 Type 2 diabetes mellitus without complications: Secondary | ICD-10-CM

## 2022-05-21 MED ORDER — BD PEN NEEDLE MICRO U/F 32G X 6 MM MISC: 5 refills | Status: DC

## 2022-05-21 NOTE — Telephone Encounter (Signed)
Called pt, pt states he needs more needles. [facility]. Pt is aware.

## 2022-06-12 ENCOUNTER — Other Ambulatory Visit: Payer: Self-pay | Admitting: Family Medicine

## 2022-06-12 DIAGNOSIS — E1169 Type 2 diabetes mellitus with other specified complication: Secondary | ICD-10-CM

## 2022-06-12 NOTE — Telephone Encounter (Signed)
Requested Prescriptions  Pending Prescriptions Disp Refills  . rosuvastatin (CRESTOR) 40 MG tablet [Pharmacy Med Name: ROSUVASTATIN CALCIUM 40 MG TAB] 90 tablet 3    Sig: TAKE 1 TABLET BY MOUTH EVERYDAY AT BEDTIME     Cardiovascular:  Antilipid - Statins 2 Failed - 06/12/2022  3:05 AM      Failed - Lipid Panel in normal range within the last 12 months    Cholesterol  Date Value Ref Range Status  04/07/2022 115 <200 mg/dL Final   LDL Cholesterol (Calc)  Date Value Ref Range Status  04/07/2022 52 mg/dL (calc) Final    Comment:    Reference range: <100 . Desirable range <100 mg/dL for primary prevention;   <70 mg/dL for patients with CHD or diabetic patients  with > or = 2 CHD risk factors. Marland Kitchen LDL-C is now calculated using the [name]-Hopkins  calculation, which is a validated novel method providing  better accuracy than the Friedewald equation in the  estimation of LDL-C.  Horald Pollen et al. Lenox Ahr. 6384;665(99): 2061-2068  (http://education.[facility].com/faq/FAQ164)    HDL  Date Value Ref Range Status  04/07/2022 40 > OR = 40 mg/dL Final   Triglycerides  Date Value Ref Range Status  04/07/2022 142 <150 mg/dL Final         Passed - Cr in normal range and within 360 days    Creat  Date Value Ref Range Status  04/07/2022 1.12 0.70 - 1.30 mg/dL Final   Creatinine, POC  Date Value Ref Range Status  07/31/2016 NEG mg/dL Final   Creatinine, Urine  Date Value Ref Range Status  11/15/2018 349 (H) 20 - 320 mg/dL Final         Passed - Patient is not pregnant      Passed - Valid encounter within last 12 months    Recent Outpatient Visits          2 months ago Diabetes mellitus type 2 in obese [facility])   [facility] [place], Sheliah Mends, PA-C   8 months ago Diabetes mellitus type 2 in obese [facility])   [facility] [place] M, DO   11 months ago Insulin dependent type 2 diabetes mellitus, uncontrolled [facility])   [facility] [place], Sheliah Mends, PA-C   1 year ago Uncontrolled insulin-treated type 2 diabetes mellitus [facility])   [facility] Danelle Berry, PA-C   1 year ago Insulin dependent type 2 diabetes mellitus, uncontrolled (HCC)   [facility] Danelle Berry, PA-C      Future Appointments            In 3 weeks Danelle Berry, PA-C [facility]

## 2022-06-16 ENCOUNTER — Other Ambulatory Visit (INDEPENDENT_AMBULATORY_CARE_PROVIDER_SITE_OTHER): Payer: Self-pay | Admitting: Vascular Surgery

## 2022-06-16 ENCOUNTER — Other Ambulatory Visit: Payer: Self-pay

## 2022-06-16 DIAGNOSIS — Z139 Encounter for screening, unspecified: Secondary | ICD-10-CM

## 2022-06-16 NOTE — Progress Notes (Signed)
Per provider request

## 2022-07-08 ENCOUNTER — Encounter: Payer: Self-pay | Admitting: Family Medicine

## 2022-07-08 ENCOUNTER — Ambulatory Visit (INDEPENDENT_AMBULATORY_CARE_PROVIDER_SITE_OTHER): Payer: Medicare Other | Admitting: Family Medicine

## 2022-07-08 VITALS — BP 118/64 | HR 97 | Temp 98.3°F | Resp 16 | Ht 71.0 in | Wt 217.6 lb

## 2022-07-08 DIAGNOSIS — I10 Essential (primary) hypertension: Secondary | ICD-10-CM

## 2022-07-08 DIAGNOSIS — D649 Anemia, unspecified: Secondary | ICD-10-CM | POA: Diagnosis not present

## 2022-07-08 DIAGNOSIS — R519 Headache, unspecified: Secondary | ICD-10-CM

## 2022-07-08 DIAGNOSIS — E1169 Type 2 diabetes mellitus with other specified complication: Secondary | ICD-10-CM | POA: Diagnosis not present

## 2022-07-08 DIAGNOSIS — E669 Obesity, unspecified: Secondary | ICD-10-CM | POA: Diagnosis not present

## 2022-07-08 DIAGNOSIS — Z23 Encounter for immunization: Secondary | ICD-10-CM | POA: Diagnosis not present

## 2022-07-08 DIAGNOSIS — E785 Hyperlipidemia, unspecified: Secondary | ICD-10-CM

## 2022-07-08 DIAGNOSIS — I739 Peripheral vascular disease, unspecified: Secondary | ICD-10-CM

## 2022-07-08 DIAGNOSIS — Z7902 Long term (current) use of antithrombotics/antiplatelets: Secondary | ICD-10-CM

## 2022-07-08 DIAGNOSIS — G8929 Other chronic pain: Secondary | ICD-10-CM

## 2022-07-08 MED ORDER — SHINGRIX 50 MCG/0.5ML IM SUSR: 0.5000 mL | Freq: Once | INTRAMUSCULAR | 0 refills | Status: AC

## 2022-07-08 NOTE — Progress Notes (Signed)
Name: Donald [name]   MRN: [id number]    DOB: 1966/03/16   Date:07/08/2022       Progress Note  Chief Complaint  Patient presents with   Follow-up   Diabetes     Subjective:   Donald [name] is a 56 y.o. male, presents to clinic for routine f/up  IDDM - med cost and insurance changes have been an issue and last A1C elevated DM:   Pt managing DM with levemir (cost went down) was on 16 units now increased to 18 units and metformin Not able to afford trulicity Working on diet  Morning blood sugars 140-150 Denies: Polyuria, polydipsia, vision changes, neuropathy, hypoglycemia Recent pertinent labs: Lab Results  Component Value Date   HGBA1C 10.6 (H) 04/07/2022   HGBA1C 6.9 (A) 10/08/2021   HGBA1C 7.0 (H) 07/08/2021   Lab Results  Component Value Date   MICROALBUR 1.3 01/17/2020   LDLCALC 52 04/07/2022   CREATININE 1.12 04/07/2022   Standard of care and health maintenance: Urine Microalbumin:  -= on ARB -  Foot exam:   done May 2023 DM eye exam:  done May 2023 ACEI/ARB:  yes - stopped lisinopril and started candelsartan  Statin:  yes     Current Outpatient Medications:    aspirin EC 81 MG EC tablet, Take 1 tablet (81 mg total) by mouth daily., Disp: , Rfl:    Blood Glucose Monitoring Suppl (D-CARE GLUCOMETER) w/Device KIT, 1 Units by Does not apply route 4 (four) times daily -  before meals and at bedtime., Disp: 1 kit, Rfl: 0   clopidogrel (PLAVIX) 75 MG tablet, TAKE 1 TABLET BY MOUTH EVERY DAY, Disp: 90 tablet, Rfl: 2   Insulin Pen Needle (BD PEN NEEDLE MICRO U/F) 32G X 6 MM MISC, USE FOR LEVEMIR INJECTION DAILY, Disp: 100 each, Rfl: 5   LEVEMIR FLEXTOUCH 100 UNIT/ML FlexTouch Pen, INJECT 15-30 UNITS INTO THE SKIN AT BEDTIME., Disp: 15 mL, Rfl: 3   lisinopril (ZESTRIL) 10 MG tablet, TAKE 1 TABLET BY MOUTH EVERY DAY, Disp: 90 tablet, Rfl: 3   metFORMIN (GLUCOPHAGE) 1000 MG tablet, Take 1 tablet (1,000 mg total) by mouth 2 (two) times daily with a meal., Disp: 180  tablet, Rfl: 3   NURTEC 75 MG TBDP, Take 1 tablet by mouth as directed., Disp: , Rfl:    rosuvastatin (CRESTOR) 40 MG tablet, TAKE 1 TABLET BY MOUTH EVERYDAY AT BEDTIME, Disp: 90 tablet, Rfl: 3   topiramate (TOPAMAX) 100 MG tablet, Take 100 mg by mouth 2 (two) times daily., Disp: , Rfl:    candesartan (ATACAND) 8 MG tablet, Take 8 mg by mouth daily., Disp: , Rfl:    Galcanezumab-gnlm (EMGALITY) 120 MG/ML SOAJ, Inject 120 mg into the skin. 28 days (Patient not taking: Reported on 04/07/2022), Disp: , Rfl:    topiramate (TOPAMAX) 50 MG tablet, 150 mg 2 (two) times daily. 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   Class 1 obesity with serious comorbidity and body mass index (BMI) of 32.0 to 32.9 in adult 05/27/2018   Edema of esophagus    Angiodysplasia of stomach and duodenum    Stomach irritation    Heartburn    Current  smoker 05/11/2018   Hypertension 09/15/2017   Hx of transient ischemic attack (TIA) 08/05/2017   Peripheral vascular insufficiency ([place]) 07/06/2017   Hyperlipidemia associated with type 2 diabetes mellitus ([place]) 10/31/2016    Past Surgical History:  Procedure Laterality Date   ESOPHAGOGASTRODUODENOSCOPY (EGD) WITH PROPOFOL N/A 05/26/2018   Procedure: ESOPHAGOGASTRODUODENOSCOPY (EGD) WITH PROPOFOL;  Surgeon: Virgel Manifold, MD;  Location: [facility] ENDOSCOPY;  Service: Endoscopy;  Laterality: N/A;   KNEE SURGERY Right     Family History  Problem Relation Age of Onset   Hypertension Father    Diabetes Father    Liver disease Father    Diverticulitis Mother    Migraines Mother    Cancer Sister        tumors in the breast    Diabetes Maternal Grandfather     Cancer Sister        tumors in the breast    Cancer Sister        tumor in the breast     Social History   Tobacco Use   Smoking status: Every Day    Packs/day: 0.50    Years: 30.00    Total pack years: 15.00    Types: Cigarettes   Smokeless tobacco: Never  Vaping Use   Vaping Use: Never used  Substance Use Topics   Alcohol use: Yes    Alcohol/week: 0.0 standard drinks of alcohol    Comment: none in the last 3 weeks   Drug use: No     Allergies  Allergen Reactions   No Known Allergies     Health Maintenance  Topic Date Due   Zoster Vaccines- Shingrix (1 of 2) Never done   COVID-19 Vaccine (3 - Pfizer series) 05/30/2020   INFLUENZA VACCINE  07/01/2022   HEMOGLOBIN A1C  10/08/2022   FOOT EXAM  04/08/2023   OPHTHALMOLOGY EXAM  04/24/2023   COLONOSCOPY (Pts 45-107yr Insurance coverage will need to be confirmed)  12/01/2024   Hepatitis C Screening  Completed   HIV Screening  Completed   HPV VACCINES  Aged Out   TETANUS/TDAP  Discontinued    Chart Review Today: I personally reviewed active problem list, medication list, allergies, family history, social history, health maintenance, notes from last encounter, lab results, imaging with the patient/caregiver today.   Review of Systems  Constitutional: Negative.   HENT: Negative.    Eyes: Negative.   Respiratory: Negative.    Cardiovascular: Negative.   Gastrointestinal: Negative.   Endocrine: Negative.   Genitourinary: Negative.   Musculoskeletal: Negative.   Skin: Negative.   Allergic/Immunologic: Negative.   Neurological: Negative.   Hematological: Negative.   Psychiatric/Behavioral: Negative.    All other systems reviewed and are negative.    Objective:   Vitals:   07/08/22 0849  BP: 118/64  Pulse: 97  Resp: 16  Temp: 98.3 F (36.8 C)  TempSrc: Oral  SpO2: 96%  Weight: 217 lb 9.6 oz (98.7 kg)  Height: _0  (1.803 m)    Body mass index is 30.35 kg/m.  Physical Exam Vitals and nursing  note reviewed.  Constitutional:      General: He is not in acute distress.    Appearance: Normal appearance. He is well-developed. He is not ill-appearing, toxic-appearing or diaphoretic.     Interventions: Face mask in place.  HENT:     Head: Normocephalic and atraumatic.     Jaw: No trismus.     Right Ear: External ear normal.     Left Ear:  External ear normal.  Eyes:     General: Lids are normal. No scleral icterus.       Right eye: No discharge.        Left eye: No discharge.     Conjunctiva/sclera: Conjunctivae normal.  Neck:     Trachea: Trachea and phonation normal. No tracheal deviation.  Cardiovascular:     Rate and Rhythm: Normal rate and regular rhythm.     Pulses: Normal pulses.          Radial pulses are 2+ on the right side and 2+ on the left side.       Posterior tibial pulses are 2+ on the right side and 2+ on the left side.     Heart sounds: Normal heart sounds. No murmur heard.    No friction rub. No gallop.  Pulmonary:     Effort: Pulmonary effort is normal. No respiratory distress.     Breath sounds: Normal breath sounds. No stridor. No wheezing, rhonchi or rales.  Abdominal:     General: Bowel sounds are normal. There is no distension.     Palpations: Abdomen is soft.  Musculoskeletal:     Right lower leg: No edema.     Left lower leg: No edema.  Skin:    General: Skin is warm and dry.     Coloration: Skin is not jaundiced.     Findings: No rash.     Nails: There is no clubbing.  Neurological:     Mental Status: He is alert. Mental status is at baseline.     Cranial Nerves: No dysarthria or facial asymmetry.     Motor: No tremor or abnormal muscle tone.     Gait: Gait normal.  Psychiatric:        Mood and Affect: Mood normal.        Speech: Speech normal.        Behavior: Behavior normal. Behavior is cooperative.         Assessment & Plan:   Problem List Items Addressed This Visit       Cardiovascular and Mediastinum   Peripheral  vascular insufficiency (HCC)   Relevant Medications   candesartan (ATACAND) 8 MG tablet   Hypertension   Relevant Medications   candesartan (ATACAND) 8 MG tablet     Endocrine   Hyperlipidemia associated with type 2 diabetes mellitus (HCC)   Relevant Medications   candesartan (ATACAND) 8 MG tablet   Diabetes mellitus type 2 in obese (HCC) - Primary   Relevant Medications   candesartan (ATACAND) 8 MG tablet   Other Relevant Orders   Hemoglobin A1c (Completed)   COMPLETE METABOLIC PANEL WITH GFR (Completed)   Microalbumin / creatinine urine ratio (Completed)     Other   Chronic intractable headache   Relevant Medications   topiramate (TOPAMAX) 100 MG tablet   Anemia   Relevant Orders   CBC with Differential/Platelet (Completed)   Other Visit Diagnoses     Need for shingles vaccine       Encounter for current long term use of antiplatelet drug            Return in about 3 months (around 10/08/2022) for IDDM recheck labs/adjust insulin/meds.   Delsa Grana, PA-C 07/08/22 9:05 AM

## 2022-07-09 LAB — CBC WITH DIFFERENTIAL/PLATELET
Absolute Monocytes: 690 cells/uL (ref 200–950)
Basophils Absolute: 55 cells/uL (ref 0–200)
Basophils Relative: 0.6 %
Eosinophils Absolute: 101 cells/uL (ref 15–500)
Eosinophils Relative: 1.1 %
HCT: 41.2 % (ref 38.5–50.0)
Hemoglobin: 13.3 g/dL (ref 13.2–17.1)
Lymphs Abs: 2392 cells/uL (ref 850–3900)
MCH: 30.6 pg (ref 27.0–33.0)
MCHC: 32.3 g/dL (ref 32.0–36.0)
MCV: 94.7 fL (ref 80.0–100.0)
MPV: 12.1 fL (ref 7.5–12.5)
Monocytes Relative: 7.5 %
Neutro Abs: 5962 cells/uL (ref 1500–7800)
Neutrophils Relative %: 64.8 %
Platelets: 253 10*3/uL (ref 140–400)
RBC: 4.35 10*6/uL (ref 4.20–5.80)
RDW: 13.7 % (ref 11.0–15.0)
Total Lymphocyte: 26 %
WBC: 9.2 10*3/uL (ref 3.8–10.8)

## 2022-07-09 LAB — COMPLETE METABOLIC PANEL WITH GFR
AG Ratio: 1.2 (calc) (ref 1.0–2.5)
ALT: 18 U/L (ref 9–46)
AST: 12 U/L (ref 10–35)
Albumin: 4 g/dL (ref 3.6–5.1)
Alkaline phosphatase (APISO): 86 U/L (ref 35–144)
BUN: 13 mg/dL (ref 7–25)
CO2: 25 mmol/L (ref 20–32)
Calcium: 10 mg/dL (ref 8.6–10.3)
Chloride: 106 mmol/L (ref 98–110)
Creat: 1.07 mg/dL (ref 0.70–1.30)
Globulin: 3.4 g/dL (calc) (ref 1.9–3.7)
Glucose, Bld: 360 mg/dL — ABNORMAL HIGH (ref 65–99)
Potassium: 4.7 mmol/L (ref 3.5–5.3)
Sodium: 141 mmol/L (ref 135–146)
Total Bilirubin: 0.2 mg/dL (ref 0.2–1.2)
Total Protein: 7.4 g/dL (ref 6.1–8.1)
eGFR: 80 mL/min/{1.73_m2} (ref >=60)

## 2022-07-09 LAB — MICROALBUMIN / CREATININE URINE RATIO
Creatinine, Urine: 95 mg/dL (ref 20–320)
Microalb Creat Ratio: 7 mcg/mg creat (ref <30)
Microalb, Ur: 0.7 mg/dL

## 2022-07-09 LAB — HEMOGLOBIN A1C
Hgb A1c MFr Bld: 13.8 % of total Hgb — ABNORMAL HIGH (ref <5.7)
Mean Plasma Glucose: 349 mg/dL
eAG (mmol/L): 19.4 mmol/L

## 2022-07-17 ENCOUNTER — Ambulatory Visit (INDEPENDENT_AMBULATORY_CARE_PROVIDER_SITE_OTHER): Payer: Medicare HMO | Admitting: Family Medicine

## 2022-07-17 ENCOUNTER — Encounter: Payer: Self-pay | Admitting: Family Medicine

## 2022-07-17 VITALS — BP 118/66 | HR 99 | Temp 98.3°F | Resp 16 | Ht 71.0 in | Wt 214.4 lb

## 2022-07-17 DIAGNOSIS — E669 Obesity, unspecified: Secondary | ICD-10-CM | POA: Diagnosis not present

## 2022-07-17 DIAGNOSIS — Z23 Encounter for immunization: Secondary | ICD-10-CM | POA: Diagnosis not present

## 2022-07-17 DIAGNOSIS — Z794 Long term (current) use of insulin: Secondary | ICD-10-CM | POA: Diagnosis not present

## 2022-07-17 DIAGNOSIS — E1165 Type 2 diabetes mellitus with hyperglycemia: Secondary | ICD-10-CM | POA: Insufficient documentation

## 2022-07-17 DIAGNOSIS — E119 Type 2 diabetes mellitus without complications: Secondary | ICD-10-CM

## 2022-07-17 DIAGNOSIS — E1169 Type 2 diabetes mellitus with other specified complication: Secondary | ICD-10-CM | POA: Diagnosis not present

## 2022-07-17 MED ORDER — TRULICITY 1.5 MG/0.5ML ~~LOC~~ SOAJ: 1.5000 mg | SUBCUTANEOUS | 1 refills | Status: DC

## 2022-07-17 MED ORDER — DAPAGLIFLOZIN PROPANEDIOL 10 MG PO TABS: 10.0000 mg | ORAL_TABLET | Freq: Every day | ORAL | 0 refills | Status: DC

## 2022-07-17 MED ORDER — TRULICITY 0.75 MG/0.5ML ~~LOC~~ SOAJ: 0.7500 mg | SUBCUTANEOUS | 0 refills | Status: AC

## 2022-07-17 MED ORDER — EMPAGLIFLOZIN 10 MG PO TABS: 10.0000 mg | ORAL_TABLET | Freq: Every day | ORAL | 1 refills | Status: DC

## 2022-07-17 NOTE — Progress Notes (Signed)
Patient ID: Donald [name], male    DOB: [birth year], 56 y.o.   MRN: [id number]  PCP: Delsa Grana, PA-C  Chief Complaint  Patient presents with   Follow-up   Diabetes    Uncontrolled DM due to labs    Subjective:   Donald [name] is a 56 y.o. male, presents to clinic with CC of the following:  PT presented recently for routine f/up and was found to have worsening uncontrolled DM Lab Results  Component Value Date   HGBA1C 13.8 (H) 07/08/2022  A1C has increased this year, unable to afford trulicity and Jardiance  Levemir dose 16 - he does not titrate Brings in his meter today, not very many readings on it - a few 200-300 range  Denies urinary frequency, polydipsia, weight loss He has lost 16 lbs in the past couple months Wt Readings from Last 8 Encounters:  07/17/22 214 lb 6.4 oz (97.3 kg)  07/08/22 217 lb 9.6 oz (98.7 kg)  04/07/22 230 lb 6.4 oz (104.5 kg)  03/18/22 228 lb (103.4 kg)  01/17/22 231 lb (104.8 kg)  10/08/21 222 lb 8 oz (100.9 kg)  07/08/21 220 lb 8 oz (100 kg)  04/26/21 217 lb (98.4 kg)   BMI Readings from Last 5 Encounters:  07/17/22 29.90 kg/m  07/08/22 30.35 kg/m  04/07/22 32.13 kg/m  03/18/22 31.80 kg/m  01/17/22 32.22 kg/m   He reports he got new insurance  Patient Active Problem List   Diagnosis Date Noted   Anemia 04/07/2022   Carotid stenosis 01/17/2022   Syncope with normal neurologic examination 03/28/2021   Chronic intractable headache 02/10/2020   Diabetes mellitus type 2 in obese ([place]) 11/12/2018   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   Class 1 obesity with serious comorbidity and body mass index (BMI) of 32.0 to 32.9 in adult 05/27/2018   Edema of esophagus    Angiodysplasia of stomach and duodenum    Stomach irritation    Heartburn    Current smoker 05/11/2018   Hypertension 09/15/2017   Hx of transient ischemic attack (TIA) 08/05/2017   Peripheral vascular insufficiency ([place]) 07/06/2017   Hyperlipidemia associated with  type 2 diabetes mellitus ([place]) 10/31/2016      Current Outpatient Medications:    aspirin EC 81 MG EC tablet, Take 1 tablet (81 mg total) by mouth daily., Disp: , Rfl:    Blood Glucose Monitoring Suppl (D-CARE GLUCOMETER) w/Device KIT, 1 Units by Does not apply route 4 (four) times daily -  before meals and at bedtime., Disp: 1 kit, Rfl: 0   candesartan (ATACAND) 8 MG tablet, Take 8 mg by mouth daily., Disp: , Rfl:    clopidogrel (PLAVIX) 75 MG tablet, TAKE 1 TABLET BY MOUTH EVERY DAY, Disp: 90 tablet, Rfl: 2   Dulaglutide (TRULICITY) 5.64 PP/2.9JJ SOPN, Inject 0.75 mg into the skin once a week for 4 doses., Disp: 2 mL, Rfl: 0   [START ON 08/11/2022] Dulaglutide (TRULICITY) 1.5 OA/4.1YS SOPN, Inject 1.5 mg into the skin once a week., Disp: 6 mL, Rfl: 1   Insulin Pen Needle (BD PEN NEEDLE MICRO U/F) 32G X 6 MM MISC, USE FOR LEVEMIR INJECTION DAILY, Disp: 100 each, Rfl: 5   LEVEMIR FLEXTOUCH 100 UNIT/ML FlexTouch Pen, INJECT 15-30 UNITS INTO THE SKIN AT BEDTIME., Disp: 15 mL, Rfl: 3   metFORMIN (GLUCOPHAGE) 1000 MG tablet, Take 1 tablet (1,000 mg total) by mouth 2 (two) times daily with a meal., Disp: 180 tablet, Rfl: 3   NURTEC 75  MG TBDP, Take 1 tablet by mouth as directed., Disp: , Rfl:    rosuvastatin (CRESTOR) 40 MG tablet, TAKE 1 TABLET BY MOUTH EVERYDAY AT BEDTIME, Disp: 90 tablet, Rfl: 3   topiramate (TOPAMAX) 100 MG tablet, Take 100 mg by mouth 2 (two) times daily., Disp: , Rfl:    Allergies  Allergen Reactions   No Known Allergies      Social History   Tobacco Use   Smoking status: Every Day    Packs/day: 0.50    Years: 30.00    Total pack years: 15.00    Types: Cigarettes   Smokeless tobacco: Never  Vaping Use   Vaping Use: Never used  Substance Use Topics   Alcohol use: Yes    Alcohol/week: 0.0 standard drinks of alcohol    Comment: none in the last 3 weeks   Drug use: No      Chart Review Today: I personally reviewed active problem list, medication list,  allergies, family history, social history, health maintenance, notes from last encounter, lab results, imaging with the patient/caregiver today.   Review of Systems  Constitutional: Negative.   HENT: Negative.    Eyes: Negative.   Respiratory: Negative.    Cardiovascular: Negative.   Gastrointestinal: Negative.   Endocrine: Negative.   Genitourinary: Negative.   Musculoskeletal: Negative.   Skin: Negative.   Allergic/Immunologic: Negative.   Neurological: Negative.   Hematological: Negative.   Psychiatric/Behavioral: Negative.    All other systems reviewed and are negative.      Objective:   Vitals:   07/17/22 0920  BP: 118/66  Pulse: 99  Resp: 16  Temp: 98.3 F (36.8 C)  TempSrc: Oral  SpO2: 97%  Weight: 214 lb 6.4 oz (97.3 kg)  Height: 5' 11"  (1.803 m)    Body mass index is 29.9 kg/m.  Physical Exam Vitals and nursing note reviewed.  Constitutional:      Appearance: He is well-developed.  HENT:     Head: Normocephalic and atraumatic.     Nose: Nose normal.  Eyes:     General:        Right eye: No discharge.        Left eye: No discharge.     Conjunctiva/sclera: Conjunctivae normal.  Neck:     Trachea: No tracheal deviation.  Cardiovascular:     Rate and Rhythm: Normal rate and regular rhythm.  Pulmonary:     Effort: Pulmonary effort is normal. No respiratory distress.     Breath sounds: No stridor.  Musculoskeletal:        General: Normal range of motion.  Skin:    General: Skin is warm and dry.     Findings: No rash.  Neurological:     Mental Status: He is alert.     Motor: No abnormal muscle tone.     Coordination: Coordination normal.  Psychiatric:        Behavior: Behavior normal.      Results for orders placed or performed in visit on 07/08/22  Hemoglobin A1c  Result Value Ref Range   Hgb A1c MFr Bld 13.8 (H) <5.7 % of total Hgb   Mean Plasma Glucose 349 mg/dL   eAG (mmol/L) 19.4 mmol/L  COMPLETE METABOLIC PANEL WITH GFR  Result  Value Ref Range   Glucose, Bld 360 (H) 65 - 99 mg/dL   BUN 13 7 - 25 mg/dL   Creat 1.07 0.70 - 1.30 mg/dL   eGFR 80 > OR = 60 mL/min/1.47m  BUN/Creatinine Ratio SEE NOTE: 6 - 22 (calc)   Sodium 141 135 - 146 mmol/L   Potassium 4.7 3.5 - 5.3 mmol/L   Chloride 106 98 - 110 mmol/L   CO2 25 20 - 32 mmol/L   Calcium 10.0 8.6 - 10.3 mg/dL   Total Protein 7.4 6.1 - 8.1 g/dL   Albumin 4.0 3.6 - 5.1 g/dL   Globulin 3.4 1.9 - 3.7 g/dL (calc)   AG Ratio 1.2 1.0 - 2.5 (calc)   Total Bilirubin 0.2 0.2 - 1.2 mg/dL   Alkaline phosphatase (APISO) 86 35 - 144 U/L   AST 12 10 - 35 U/L   ALT 18 9 - 46 U/L  Microalbumin / creatinine urine ratio  Result Value Ref Range   Creatinine, Urine 95 20 - 320 mg/dL   Microalb, Ur 0.7 mg/dL   Microalb Creat Ratio 7 <30 mcg/mg creat  CBC with Differential/Platelet  Result Value Ref Range   WBC 9.2 3.8 - 10.8 Thousand/uL   RBC 4.35 4.20 - 5.80 Million/uL   Hemoglobin 13.3 13.2 - 17.1 g/dL   HCT 41.2 38.5 - 50.0 %   MCV 94.7 80.0 - 100.0 fL   MCH 30.6 27.0 - 33.0 pg   MCHC 32.3 32.0 - 36.0 g/dL   RDW 13.7 11.0 - 15.0 %   Platelets 253 140 - 400 Thousand/uL   MPV 12.1 7.5 - 12.5 fL   Neutro Abs 5,962 1,500 - 7,800 cells/uL   Lymphs Abs 2,392 850 - 3,900 cells/uL   Absolute Monocytes 690 200 - 950 cells/uL   Eosinophils Absolute 101 15 - 500 cells/uL   Basophils Absolute 55 0 - 200 cells/uL   Neutrophils Relative % 64.8 %   Total Lymphocyte 26.0 %   Monocytes Relative 7.5 %   Eosinophils Relative 1.1 %   Basophils Relative 0.6 %       Assessment & Plan:     Problem List Items Addressed This Visit       Endocrine   Diabetes mellitus type 2 in obese (HCC) - Primary   Relevant Medications   Dulaglutide (TRULICITY) 7.28 AS/6.0RV SOPN   Dulaglutide (TRULICITY) 1.5 IF/5.3PH SOPN (Start on 08/11/2022)   dapagliflozin propanediol (FARXIGA) 10 MG TABS tablet   empagliflozin (JARDIANCE) 10 MG TABS tablet   Other Relevant Orders   AMB Referral to  [facility]   Uncontrolled type 2 diabetes mellitus with hyperglycemia (HCC)    Worsening A1C this year Could not afford trulicity or jardiance Only on metformin and levemir - but not titrating insulin - doing 16 unit dose daily, recent CBGs reported lowest ~220 Denies: Polyuria, polydipsia, vision changes, neuropathy, hypoglycemia Recent pertinent labs: Lab Results  Component Value Date   HGBA1C 13.8 (H) 07/08/2022   HGBA1C 10.6 (H) 04/07/2022   HGBA1C 6.9 (A) 10/08/2021    Plan -  He changed insurance believes he may now be able to afford trulicity Refill on trulicity one month of 4.32 mg dose and then resume 1.5 mg weekly dose Gave samples of farxiga from clinic - will see if farxiga or jardiance are covered - explained to pt they are same category of med, SE of MOA Continue metformin Titrate levemir for fasting CBG's - if >200 increase by 2 units and monitor for the next 3-7 d before any other dose adjustment (more conservative with adding back meds) Gave freestyle libre 3 meter sample Consult with CCM pharmacist between appts to help with management  Keep 3 month f/up  Get additional appt if problems with sugars or concerns       Relevant Medications   Dulaglutide (TRULICITY) 9.45 OP/9.2TW SOPN   Dulaglutide (TRULICITY) 1.5 KM/6.2MM SOPN (Start on 08/11/2022)   dapagliflozin propanediol (FARXIGA) 10 MG TABS tablet   empagliflozin (JARDIANCE) 10 MG TABS tablet   Other Relevant Orders   AMB Referral to [facility]   Insulin dependent type 2 diabetes mellitus ([place])   Relevant Medications   Dulaglutide (TRULICITY) 3.81 RR/1.1AF SOPN   Dulaglutide (TRULICITY) 1.5 BX/0.3YB SOPN (Start on 08/11/2022)   dapagliflozin propanediol (FARXIGA) 10 MG TABS tablet   empagliflozin (JARDIANCE) 10 MG TABS tablet   Other Relevant Orders   AMB Referral to [facility]   Other Visit Diagnoses     Need for influenza vaccination       Relevant  Orders   Flu Vaccine QUAD 6+ mos PF IM (Fluarix Quad PF) (Completed)         Delsa Grana, PA-C 07/17/22 9:48 AM

## 2022-07-17 NOTE — Assessment & Plan Note (Addendum)
Worsening A1C this year Could not afford trulicity or jardiance Only on metformin and levemir - but not titrating insulin - doing 16 unit dose daily, recent CBGs reported lowest ~220 Denies: Polyuria, polydipsia, vision changes, neuropathy, hypoglycemia Recent pertinent labs: Lab Results  Component Value Date   HGBA1C 13.8 (H) 07/08/2022   HGBA1C 10.6 (H) 04/07/2022   HGBA1C 6.9 (A) 10/08/2021    Plan -  Donald [name] changed insurance believes Donald [name] may now be able to afford trulicity Refill on trulicity one month of 0.75 mg dose and then resume 1.5 mg weekly dose Gave samples of farxiga from clinic - will see if farxiga or jardiance are covered - explained to pt they are same category of med, SE of MOA Continue metformin Titrate levemir for fasting CBG's - if >200 increase by 2 units and monitor for the next 3-7 d before any other dose adjustment (more conservative with adding back meds) Gave freestyle libre 3 meter sample Consult with CCM pharmacist between appts to help with management  Keep 3 month f/up Get additional appt if problems with sugars or concerns

## 2022-07-18 ENCOUNTER — Telehealth: Payer: Self-pay | Admitting: *Deleted

## 2022-07-18 NOTE — Chronic Care Management (AMB) (Signed)
  Chronic Care Management   Note  07/18/2022 Name: Donald [name] MRN: [id number] DOB: 1966/09/23  Donald [name] is a 56 y.o. year old male who is a primary care patient of Delsa Grana, [place]. I reached out to [place] by phone today in response to a referral sent by Mr. Donald [name]'s PCP.  Donald [name] was given information about Chronic Care Management services today including:  CCM service includes personalized support from designated clinical staff supervised by his physician, including individualized plan of care and coordination with other care providers 24/7 contact phone numbers for assistance for urgent and routine care needs. Service will only be billed when office clinical staff spend 20 minutes or more in a month to coordinate care. Only one practitioner may furnish and bill the service in a calendar month. The patient may stop CCM services at any time (effective at the end of the month) by phone call to the office staff. The patient is responsible for co-pay (up to 20% after annual deductible is met) if co-pay is required by the individual health plan.   Patient agreed to services and verbal consent obtained.   Follow up plan: Telephone appointment with care management team member scheduled for: 08/20/2022  Julian Hy, [place] Direct Dial: [phone]

## 2022-08-14 ENCOUNTER — Telehealth: Payer: Self-pay

## 2022-08-14 NOTE — Progress Notes (Signed)
Chronic Care Management Pharmacy Assistant   Name: Donald [name]  MRN: [id number] DOB: 1966-01-07  Reason for Encounter: Initial Visit Assessment for telephone visit with Junius Argyle, CPP on 08/20/2022 @ 0930   Conditions to be addressed/monitored: HTN, HLD, DMII, Tobacco Use, and Peripheral Vascular Insufficiency, Angiodysplasia of stomach and duodenum, Carotid Stenosis, Class 1 obesity with serious comorbidity, Chronic intractable headache, Anemia  Primary concerns for visit include: Per patient he wasn't home, but he can't think of any primary concerns at the moment. He will speak with CPP tomorrow about any issues he thinks of.  Recent office visits:  07/17/2022 Delsa Grana, PA-C (PCP Office Visit) for Follow-up- Start: Dapagliflozin Propanediol 10 mg before breakfast, Dulaglutide 0.75 weekly and 1.5 mg weekly, Empagliflozin 10 mg daily before breakfast, No orders placed, Patient to follow-up in 3 months  07/08/2022 Delsa Grana, PA-C (PCP Office Visit) for Follow-up- Stopped: Galcanezumab 120 mg due to patient not taking, Changed: Topiramate 150 mg twice daily to 100 mg twice daily, Levemir 16 units increased to 18 units, Lab orders placed, Patient to follow-up in 3 months  04/07/2022 Delsa Grana, PA-C (PCP Office Visit) for DM II- No medication changes noted, Lab orders placed, follow-up in 3 months  Recent consult visits:  04/22/2022 Ray Church, MD (Neurology) for Migraine- Stopped: Lisinopril 10 mg daily, Start: Candesartan 8 mg daily, Referral to [facility] Tobacco Cessation program but patient deferred, Patient to follow-up in 4-5 months  03/18/2022 Leotis Pain, MD (Vascular Surgery) for Follow-up- No medication changes noted, VAS US Carotid order placed, Patient to follow-up in 6 months  Hospital visits:  None in previous 6 months  Medications: Outpatient Encounter Medications as of 08/14/2022  Medication Sig   aspirin EC 81 MG EC tablet Take 1 tablet (81 mg  total) by mouth daily.   Blood Glucose Monitoring Suppl (D-CARE GLUCOMETER) w/Device KIT 1 Units by Does not apply route 4 (four) times daily -  before meals and at bedtime.   candesartan (ATACAND) 8 MG tablet Take 8 mg by mouth daily.   clopidogrel (PLAVIX) 75 MG tablet TAKE 1 TABLET BY MOUTH EVERY DAY   dapagliflozin propanediol (FARXIGA) 10 MG TABS tablet Take 1 tablet (10 mg total) by mouth daily before breakfast.   Dulaglutide (TRULICITY) 1.5 CR/7.5OH SOPN Inject 1.5 mg into the skin once a week.   empagliflozin (JARDIANCE) 10 MG TABS tablet Take 1 tablet (10 mg total) by mouth daily before breakfast.   Insulin Pen Needle (BD PEN NEEDLE MICRO U/F) 32G X 6 MM MISC USE FOR LEVEMIR INJECTION DAILY   LEVEMIR FLEXTOUCH 100 UNIT/ML FlexTouch Pen INJECT 15-30 UNITS INTO THE SKIN AT BEDTIME.   metFORMIN (GLUCOPHAGE) 1000 MG tablet Take 1 tablet (1,000 mg total) by mouth 2 (two) times daily with a meal.   NURTEC 75 MG TBDP Take 1 tablet by mouth as directed.   rosuvastatin (CRESTOR) 40 MG tablet TAKE 1 TABLET BY MOUTH EVERYDAY AT BEDTIME   topiramate (TOPAMAX) 100 MG tablet Take 100 mg by mouth 2 (two) times daily.   No facility-administered encounter medications on file as of 08/14/2022.   Care Gaps: Zoster Vaccine A1C>9  Star Rating Drugs: Farxiga 10 mg No fill history noted Trulicity 1.5 mg last filled on 07/17/2022 for a 28-Day supply with [facility] Jardiance 10 mg last filled on 07/17/2022 for a 30-Day supply with [facility] Metformin 1000 mg last filled on 09/26/2021 for a 90-Day supply with [facility] Rosuvastatin 40 mg last  filled on 06/12/2022 for a 90-Day supply with [facility]  Questions for Clinical Pharmacist:   1.Are you able to connect with Patient?    Yes    2.Confirmed appointment date/time with patient/caregiver? Confirm appointment on 08/20/2022 at 0930 with Riki Rusk, CPP   3.Visit type telephone     4.Patient/Caregiver instructed to bring  medications to appointment. Patient is aware to bring all medications and supplements to appointment    5.What, if any, problems do you have getting your medications from the pharmacy? Financial barriers, the patient stated he wasn't home so he was unable to provide me with what medications he needed assistance with. I did advise him to make sure he has that information ready to provide to CPP tomorrow.   6.What is your top health concern to discuss at your upcoming visit?Patient stated he wasn't home he was getting his car fixed so he will speak with CPP tomorrow.   7.Have you seen any other providers since your last visit? No     [name], CPA/CMA Clinical Pharmacist Assistant Phone: [phone]

## 2022-08-20 ENCOUNTER — Ambulatory Visit (INDEPENDENT_AMBULATORY_CARE_PROVIDER_SITE_OTHER): Payer: Medicare HMO

## 2022-08-20 DIAGNOSIS — I10 Essential (primary) hypertension: Secondary | ICD-10-CM

## 2022-08-20 DIAGNOSIS — Z8673 Personal history of transient ischemic attack (TIA), and cerebral infarction without residual deficits: Secondary | ICD-10-CM

## 2022-08-20 DIAGNOSIS — G43719 Chronic migraine without aura, intractable, without status migrainosus: Secondary | ICD-10-CM

## 2022-08-20 DIAGNOSIS — E1169 Type 2 diabetes mellitus with other specified complication: Secondary | ICD-10-CM

## 2022-08-20 MED ORDER — TRESIBA FLEXTOUCH 200 UNIT/ML ~~LOC~~ SOPN: PEN_INJECTOR | SUBCUTANEOUS | Status: DC

## 2022-08-20 MED ORDER — OZEMPIC (0.25 OR 0.5 MG/DOSE) 2 MG/3ML ~~LOC~~ SOPN: 0.5000 mg | PEN_INJECTOR | SUBCUTANEOUS | Status: DC

## 2022-08-20 NOTE — Patient Instructions (Signed)
Visit Information It was great speaking with you today!  Please let me know if you have any questions about our visit.   Goals Addressed             This Visit's Progress    Monitor and Manage My Blood Sugar-Diabetes Type 2   On track    Timeframe:  Long-Range Goal Priority:  High Start Date:  01/23/2021                           Expected End Date: 10/26/2023                      Follow within one week   - check blood sugar once daily before breakfast - check blood sugar if I feel it is too high or too low - enter blood sugar readings and medication or insulin into daily log    Why is this important?   Checking your blood sugar at home helps to keep it from getting very high or very low.  Writing the results in a diary or log helps the doctor know how to care for you.  Your blood sugar log should have the time, date and the results.  Also, write down the amount of insulin or other medicine that you take.  Other information, like what you ate, exercise done and how you were feeling, will also be helpful.     Notes:         Patient Care Plan: General Pharmacy (Adult)     Problem Identified: Hypertension, Hyperlipidemia, Diabetes, Tobacco use and Chronic Migraines, and History of TIA   Priority: High     Long-Range Goal: Patient-Specific Goal   Start Date: 08/20/2022  Expected End Date: 08/21/2023  This Visit's Progress: On track  Recent Progress: On track  Priority: High  Note:   Current Barriers:  Unable to achieve control of diabetes   Pharmacist Clinical Goal(s):  Over the next 90 days, patient will achieve control of diabetes as evidenced by A1c less than 8% through collaboration with PharmD and provider.   Interventions: 1:1 collaboration with Delsa Grana, PA-C regarding development and update of comprehensive plan of care as evidenced by provider attestation and co-signature Inter-disciplinary care team collaboration (see longitudinal plan of  care) Comprehensive medication review performed; medication list updated in electronic medical record  Hypertension (BP goal <130/80) -Controlled -Current treatment: Candesartan 8 mg daily  -Medications previously tried: NA  -Current home readings: NA -Recommended to continue current medication  Hyperlipidemia: (LDL goal < 55) -History of TIA -Controlled -Current treatment: Rosuvastatin 40 mg daily  -Medications previously tried: NA  -Educated on Importance of limiting foods high in cholesterol; -Continue current medications  Diabetes (A1c goal <7%) -Diagnosed 2019  -Controlled -Current medications: Levemir 16 units daily  Metformin 1000 mg twice daily  -Medications previously tried: Jardiance (Cost)  -Current home glucose readings fasting glucose: 200-300 post prandial glucose: NA -Denies hypoglycemic/hyperglycemic symptoms -Questionable adherence to metformin, states he takes twice daily but has not filled since Oct 2022. States he has excess supply from early fills from pharmacy. Not interested in changing pharmacy.  -Only taking Farxiga samples once weekly, thought it was like Trulicity.  -Finish supply of [place], then STOP. Will consider restarting once blood sugars improve.  -INCREASE Levemir to 18 units daily.  -START PAP for Ozempic 0.5 mg weekly + Tresiba 48 units daily   Chronic Migraines (Goal: Prevent migraines) -Managed  by Dr. Sherryll Burger  -Uncontrolled -Current treatment  Nurtec 75 mg daily as needed  Topiramate 100 mg twice daily  -Medications previously tried: [employer]   -Migraines daily, States he is receiving only mild relief from topiramate. Despite this he rarely needs to use Nurtec only using it for debilitating migraines. He also states he is using Emgality, but recent notes indicate he may be having cost challenges. [facility]  -Recommended to continue current medication  Patient Goals/Self-Care Activities Over the next  90 days, patient will:  - check glucose daily (before breakfast), document, and provide at future appointments check blood pressure weekly, document, and provide at future appointments  Follow Up Plan: Telephone follow up appointment with care management team member scheduled for:  09/10/22 at 8:30 AM    Donald [name] was given information about Chronic Care Management services today including:  CCM service includes personalized support from designated clinical staff supervised by his physician, including individualized plan of care and coordination with other care providers 24/7 contact phone numbers for assistance for urgent and routine care needs. Standard insurance, coinsurance, copays and deductibles apply for chronic care management only during months in which we provide at least 20 minutes of these services. Most insurances cover these services at 100%, however patients may be responsible for any copay, coinsurance and/or deductible if applicable. This service may help you avoid the need for more expensive face-to-face services. Only one practitioner may furnish and bill the service in a calendar month. The patient may stop CCM services at any time (effective at the end of the month) by phone call to the office staff.  Patient agreed to services and verbal consent obtained.   Patient verbalizes understanding of instructions and care plan provided today and agrees to view in MyChart. Active MyChart status and patient understanding of how to access instructions and care plan via MyChart confirmed with patient.     Cheyenne Adas, CPP Clinical Pharmacist Practitioner  [facility] [phone]

## 2022-08-20 NOTE — Progress Notes (Signed)
Chronic Care Management Pharmacy Note  08/20/2022 Name:  Donald [name] MRN:  [id number] DOB:  Sep 13, 1966  Summary: Patient presents for initial CCM consult.   -Unable to afford Lovie Macadamia, or Trulicity.   -Questionable adherence to metformin, states he takes twice daily but has not filled since Oct 2022. States he has excess supply from early fills from pharmacy. Not interested in changing pharmacy.   -Only taking Farxiga samples once weekly, thought it was like trulicity.   Recommendations/Changes made from today's visit: -Finish supply of Farxiga, then STOP. Will consider restarting once blood sugars improve.  -INCREASE Levemir to 18 units daily.  -START PAP for Ozempic 0.5 mg weekly + Tresiba 48 units daily   Plan: CPP follow-up 1 month  HC DM assessment every 3-4 days for insulin titration.   Subjective: Donald [name] is an 56 y.o. year old male who is a primary patient of Delsa Grana, [place].  The CCM team was consulted for assistance with disease management and care coordination needs.    Engaged with patient by telephone for follow up visit in response to provider referral for pharmacy case management and/or care coordination services.   Consent to Services:  The patient was given information about Chronic Care Management services, agreed to services, and gave verbal consent prior to initiation of services.  Please see initial visit note for detailed documentation.   Patient Care Team: Delsa Grana, PA-C as PCP - General (Family Medicine) Germaine Pomfret, [facility] (Pharmacist)  Recent office visits: 07/17/22: Patient presented to Delsa Grana, PA-C for follow-up. A1c 13.8%.  07/08/22: Patient presented to Delsa Grana, PA-C for follow-up.  Recent consult visits: 04/22/22: Patient presented to Dr. Manuella Ghazi (neurology) for follow-up.  03/18/22: Patient presented to Dr. Lucky Cowboy (vascular) for follow-up.   Hospital visits: None in previous 6 months  Objective:  Lab  Results  Component Value Date   CREATININE 1.07 07/08/2022   BUN 13 07/08/2022   GFRNONAA 65 03/26/2021   GFRAA 76 03/26/2021   NA 141 07/08/2022   K 4.7 07/08/2022   CALCIUM 10.0 07/08/2022   CO2 25 07/08/2022    Lab Results  Component Value Date/Time   HGBA1C 13.8 (H) 07/08/2022 09:33 AM   HGBA1C 10.6 (H) 04/07/2022 09:10 AM   MICROALBUR 0.7 07/08/2022 09:33 AM   MICROALBUR 1.3 01/17/2020 10:27 AM   MICROALBUR NEG 07/31/2016 04:14 PM    Last diabetic Eye exam:  Lab Results  Component Value Date/Time   HMDIABEYEEXA No Retinopathy 04/23/2022 12:00 AM    Last diabetic Foot exam: No results found for: "HMDIABFOOTEX"   Lab Results  Component Value Date   CHOL 115 04/07/2022   HDL 40 04/07/2022   LDLCALC 52 04/07/2022   TRIG 142 04/07/2022   CHOLHDL 2.9 04/07/2022       Latest Ref Rng & Units 07/08/2022    9:33 AM 04/07/2022    9:10 AM 07/08/2021    3:22 PM  Hepatic Function  Total Protein 6.1 - 8.1 g/dL 7.4  7.6  7.3   AST 10 - 35 U/L _0 ALT 9 - 46 U/L _1 Total Bilirubin 0.2 - 1.2 mg/dL 0.2  0.2  0.2     Lab Results  Component Value Date/Time   TSH 0.73 04/15/2019 08:43 AM   TSH 0.44 03/27/2017 10:50 AM       Latest Ref Rng & Units 07/08/2022    9:33 AM 04/07/2022  9:10 AM 07/08/2021    3:22 PM  CBC  WBC 3.8 - 10.8 Thousand/uL 9.2  10.7  11.1   Hemoglobin 13.2 - 17.1 g/dL 13.3  11.4  13.1   Hematocrit 38.5 - 50.0 % 41.2  34.6  38.8   Platelets 140 - 400 Thousand/uL 253  299  284     Lab Results  Component Value Date/Time   VD25OH 63 07/12/2020 03:39 PM   VD25OH 14 (L) 01/17/2020 10:27 AM    Clinical ASCVD: Yes  The ASCVD Risk score (Arnett DK, et al., 2019) failed to calculate for the following reasons:   The valid total cholesterol range is 130 to 320 mg/dL       07/17/2022    9:21 AM 07/08/2022    8:45 AM 04/07/2022    8:40 AM  Depression screen PHQ 2/9  Decreased Interest 0 0 0  Down, Depressed, Hopeless 0 0 0  PHQ - 2 Score 0 0  0  Altered sleeping 0 0 0  Tired, decreased energy 0 0 0  Change in appetite 0 0 0  Feeling bad or failure about yourself  0 0 0  Trouble concentrating 0 0 0  Moving slowly or fidgety/restless 0 0 0  Suicidal thoughts 0 0 0  PHQ-9 Score 0 0 0  Difficult doing work/chores Not difficult at all Not difficult at all Not difficult at all     Social History   Tobacco Use  Smoking Status Every Day   Packs/day: 0.50   Years: 30.00   Total pack years: 15.00   Types: Cigarettes  Smokeless Tobacco Never   BP Readings from Last 3 Encounters:  07/17/22 118/66  07/08/22 118/64  04/07/22 122/64   Pulse Readings from Last 3 Encounters:  07/17/22 99  07/08/22 97  04/07/22 94   Wt Readings from Last 3 Encounters:  07/17/22 214 lb 6.4 oz (97.3 kg)  07/08/22 217 lb 9.6 oz (98.7 kg)  04/07/22 230 lb 6.4 oz (104.5 kg)    Assessment/Interventions: Review of patient past medical history, allergies, medications, health status, including review of consultants reports, laboratory and other test data, was performed as part of comprehensive evaluation and provision of chronic care management services.   SDOH:  (Social Determinants of Health) assessments and interventions performed: Yes SDOH Interventions    Flowsheet Row Chronic Care Management from 07/25/2021 in [place] [facility] from 04/24/2021 in [facility]  SDOH Interventions    Financial Strain Interventions Patient Refused Other (Comment)  [PAP]       CCM Care Plan  Allergies  Allergen Reactions   No Known Allergies     Medications Reviewed Today     Reviewed by Germaine Pomfret, [facility] (Pharmacist) on 08/20/22 at 1041  Med List Status: <None>   Medication Order Taking? Sig Documenting Provider Last Dose Status Informant  aspirin EC 81 MG EC tablet [id number] Yes Take 1 tablet (81 mg total) by mouth daily. Hillary Bow, MD Taking Active Self  Blood Glucose Monitoring  Suppl (D-CARE GLUCOMETER) w/Device KIT [id number]  1 Units by Does not apply route 4 (four) times daily -  before meals and at bedtime. Hillary Bow, MD  Active Self  candesartan (ATACAND) 8 MG tablet [id number] Yes Take 8 mg by mouth daily. [provider] Taking Active   clopidogrel (PLAVIX) 75 MG tablet [id number] Yes TAKE 1 TABLET BY MOUTH EVERY DAY Fayrene Helper, MD Taking Active   insulin degludec (  TRESIBA FLEXTOUCH) 200 UNIT/ML FlexTouch Pen [id number] Yes Inject 16-48 units in the skin nightly. [place] Patient Assistance [place], Kristeen Miss, PA-C  Active            Med Note Michaelle Birks, Laekyn Rayos A   Wed Aug 20, 2022 10:41 AM) Has not started yet.  Insulin Pen Needle (BD PEN NEEDLE MICRO U/F) 32G X 6 MM MISC [id number]  USE FOR LEVEMIR INJECTION DAILY Delsa Grana, PA-C  Active   LEVEMIR FLEXTOUCH 100 UNIT/ML FlexTouch Pen [id number] Yes INJECT 15-30 UNITS INTO THE SKIN AT BEDTIME. Myles Gip, DO Taking Active            Med Note Michaelle Birks, Saleema Weppler A   Wed Aug 20, 2022 10:29 AM) Currently 18 units daily  metFORMIN (GLUCOPHAGE) 1000 MG tablet [id number] Yes Take 1 tablet (1,000 mg total) by mouth 2 (two) times daily with a meal. Delsa Grana, PA-C Taking Active   NURTEC 75 MG TBDP [id number] Yes Take 1 tablet by mouth as directed. [provider] Taking Active   rosuvastatin (CRESTOR) 40 MG tablet [id number] Yes TAKE 1 TABLET BY MOUTH EVERYDAY AT BEDTIME Delsa Grana, PA-C Taking Active   Semaglutide,0.25 or 0.5MG/DOS, (OZEMPIC, 0.25 OR 0.5 MG/DOSE,) 2 MG/3ML SOPN [id number] Yes Inject 0.5 mg into the skin once a week. [place] Patient Assistance [place], Kristeen Miss, PA-C  Active            Med Note Michaelle Birks, Arpita Fentress A   Wed Aug 20, 2022 10:41 AM) Has not started yet.  topiramate (TOPAMAX) 100 MG tablet [id number] Yes Take 100 mg by mouth 2 (two) times daily. 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   Class 1 obesity with serious comorbidity and body mass index (BMI) of 32.0 to 32.9 in adult 05/27/2018   Edema of esophagus    Angiodysplasia of stomach and duodenum    Stomach irritation    Heartburn    Current smoker 05/11/2018   Hypertension 09/15/2017   Hx of transient ischemic attack (TIA) 08/05/2017   Peripheral vascular insufficiency ([place]) 07/06/2017   Hyperlipidemia associated with type 2 diabetes mellitus ([place]) 10/31/2016    Immunization History  Administered Date(s) Administered   Influenza,inj,Quad PF,6+ Mos 10/31/2016, 08/27/2017, 09/29/2018, 09/27/2019, 07/17/2022   Influenza-Unspecified 10/31/2016, 08/27/2017, 09/29/2018, 09/27/2019   PFIZER(Purple Top)SARS-COV-2 Vaccination 03/10/2020, 04/04/2020   PNEUMOCOCCAL CONJUGATE-20 07/08/2021   Pneumococcal Polysaccharide-23 11/27/2017    Conditions to be addressed/monitored:  Hypertension, Hyperlipidemia, Diabetes, Tobacco use and Chronic Migraines, and History of TIA   Care Plan : General Pharmacy (Adult)  Updates made by Germaine Pomfret, RPH since 08/20/2022 12:00 AM     Problem: Hypertension, Hyperlipidemia, Diabetes, Tobacco use and Chronic Migraines, and History of TIA   Priority: High     Long-Range Goal: Patient-Specific Goal   Start Date: 08/20/2022  Expected End Date: 08/21/2023  This Visit's Progress: On track  Recent Progress: On track  Priority: High  Note:   Current Barriers:  Unable to achieve control of diabetes   Pharmacist Clinical  Goal(s):  Over the next 90 days, patient will achieve control of diabetes as evidenced by A1c less than 8% through collaboration with PharmD and provider.   Interventions: 1:1 collaboration with Delsa Grana, PA-C regarding development and update of comprehensive plan of care as evidenced by provider attestation and co-signature Inter-disciplinary care team collaboration (see longitudinal plan of care) Comprehensive medication review performed; medication list updated in electronic medical record  Hypertension (BP goal <130/80) -Controlled -Current treatment: Candesartan 8 mg daily  -Medications previously tried: NA  -Current home readings: NA -Recommended to continue current medication  Hyperlipidemia: (LDL goal < 55) -History of TIA -Controlled -Current treatment: Rosuvastatin 40 mg daily  -Medications previously tried: NA  -Educated on Importance of limiting foods high in cholesterol; -Continue current medications  Diabetes (A1c goal <7%) -Diagnosed 2019  -Controlled -Current medications: Levemir 16 units daily  Metformin 1000 mg twice daily  -Medications previously tried: Jardiance (Cost)  -Current home glucose readings fasting glucose: 200-300 post prandial glucose: NA -Denies hypoglycemic/hyperglycemic symptoms -Questionable adherence to metformin, states he takes twice daily but has not filled since Oct 2022. States he has excess supply from early fills from pharmacy. Not interested in changing pharmacy.  -Only taking Farxiga samples once weekly, thought it was like Trulicity.  -Finish supply of [place], then STOP. Will consider restarting once blood sugars improve.  -INCREASE Levemir to 18 units daily.  -START PAP for Ozempic 0.5 mg weekly + Tresiba 48 units daily   Chronic Migraines (Goal: Prevent migraines) -Managed by Dr. Manuella Ghazi  -Uncontrolled -Current treatment  Nurtec 75 mg daily as needed  Topiramate 100 mg twice daily  -Medications previously tried:  [employer]   -Migraines daily, States he is receiving only mild relief from topiramate. Despite this he rarely needs to use Nurtec only using it for debilitating migraines. He also states he is using Emgality, but recent notes indicate he may be having cost challenges. [facility]  -Recommended to continue current medication  Patient Goals/Self-Care Activities Over the next 90 days, patient will:  - check glucose daily (before breakfast), document, and provide at future appointments check blood pressure weekly, document, and provide at future appointments  Follow Up Plan: Telephone follow up appointment with care management team member scheduled for:  09/10/22 at 8:30 AM    Medication Assistance: Application for Jardiance, Levemir, Trulicity  medication assistance program. in process.  Anticipated assistance start date 02/25/2021.  See plan of care for additional detail.  [id number] [id number]: [phone]: [phone] Uses pill box? Yes Pt endorses 100% compliance  We discussed: Current pharmacy is preferred with insurance plan and patient is satisfied with pharmacy services Patient decided to: Continue current medication management strategy  Care Plan and Follow Up Patient Decision:  Patient agrees to Care Plan and Follow-up.  Plan: Telephone follow up appointment with care management team member scheduled for:  09/10/22 at 8:30 AM  AMalva Limes CMaricopaPharmacist Practitioner  [phone]

## 2022-08-21 ENCOUNTER — Telehealth: Payer: Self-pay

## 2022-08-21 NOTE — Progress Notes (Signed)
    Chronic Care Management Pharmacy Assistant   Name: Donald [name]  MRN: [id number] DOB: [birth year]  Reason for Encounter: Patient Assistance Application for Ozempic and Tresiba   I received a task from Junius Argyle, CPP requesting that I start the process for patient assistance for the patient's Ozempic 0.5 mg weekly and Tresiba 16-48 units nightly.  The patient assistance application will be started through [employer]. The application has been started by me. Application has been e-mailed to CPP/PTM for mailing to the patient's home address.   Patient can contact me directly @ [phone] if he has any questions. Medications: Outpatient Encounter Medications as of 08/21/2022  Medication Sig Note   aspirin EC 81 MG EC tablet Take 1 tablet (81 mg total) by mouth daily.    Blood Glucose Monitoring Suppl (D-CARE GLUCOMETER) w/Device KIT 1 Units by Does not apply route 4 (four) times daily -  before meals and at bedtime.    candesartan (ATACAND) 8 MG tablet Take 8 mg by mouth daily.    clopidogrel (PLAVIX) 75 MG tablet TAKE 1 TABLET BY MOUTH EVERY DAY    insulin degludec (TRESIBA FLEXTOUCH) 200 UNIT/ML FlexTouch Pen Inject 16-48 units in the skin nightly. [place] Patient Assistance 08/20/2022: Has not started yet.   Insulin Pen Needle (BD PEN NEEDLE MICRO U/F) 32G X 6 MM MISC USE FOR LEVEMIR INJECTION DAILY    LEVEMIR FLEXTOUCH 100 UNIT/ML FlexTouch Pen INJECT 15-30 UNITS INTO THE SKIN AT BEDTIME. 08/20/2022: Currently 18 units daily   metFORMIN (GLUCOPHAGE) 1000 MG tablet Take 1 tablet (1,000 mg total) by mouth 2 (two) times daily with a meal.    NURTEC 75 MG TBDP Take 1 tablet by mouth as directed.    rosuvastatin (CRESTOR) 40 MG tablet TAKE 1 TABLET BY MOUTH EVERYDAY AT BEDTIME    Semaglutide,0.25 or 0.5MG /DOS, (OZEMPIC, 0.25 OR 0.5 MG/DOSE,) 2 MG/3ML SOPN Inject 0.5 mg into the skin once a week. [place] Patient Assistance 08/20/2022: Has not  started yet.   topiramate (TOPAMAX) 100 MG tablet Take 100 mg by mouth 2 (two) times daily.    No facility-administered encounter medications on file as of 08/21/2022.    [name], CPA/CMA Clinical Pharmacist Assistant Phone: [phone]

## 2022-08-26 ENCOUNTER — Telehealth: Payer: Self-pay

## 2022-08-26 NOTE — Progress Notes (Signed)
    Chronic Care Management Pharmacy Assistant   Name: Donald [name]  MRN: [id number] DOB: 12-18-1965  Reason for Encounter: Diabetes Follow-up   I received a task from Junius Argyle, CPP requesting that I contact the patient to do a check-in. Per CPP he would like me to Review the patient's last checks of his blood sugars and confirm the dosage of his Levemir that he is taking daily  I contacted the patient and he reports that his fasting blood sugars have been running between 200-240 with a usage of 18 units of Levemir nightly.   I informed CPP of the patient's numbers and insulin dosage usage and per CPP  08/27/2022   Junius Argyle, CPP sent me a task asking me to advise the patient that he would like him to increase his Levemir to 20 units daily.  I contacted the patient and informed him starting today to increase his Levemir to 20 units daily, and I would contact patient next week to get updates on his blood sugar levels so CPP will know how he is doing on the adjustment. Patient verbalized understanding and instructed to call me directly at [phone] if he has any questions or concerns.  Medications: Outpatient Encounter Medications as of 08/26/2022  Medication Sig Note   aspirin EC 81 MG EC tablet Take 1 tablet (81 mg total) by mouth daily.    Blood Glucose Monitoring Suppl (D-CARE GLUCOMETER) w/Device KIT 1 Units by Does not apply route 4 (four) times daily -  before meals and at bedtime.    candesartan (ATACAND) 8 MG tablet Take 8 mg by mouth daily.    clopidogrel (PLAVIX) 75 MG tablet TAKE 1 TABLET BY MOUTH EVERY DAY    insulin degludec (TRESIBA FLEXTOUCH) 200 UNIT/ML FlexTouch Pen Inject 16-48 units in the skin nightly. [place] Patient Assistance 08/20/2022: Has not started yet.   Insulin Pen Needle (BD PEN NEEDLE MICRO U/F) 32G X 6 MM MISC USE FOR LEVEMIR INJECTION DAILY    LEVEMIR FLEXTOUCH 100 UNIT/ML FlexTouch Pen INJECT 15-30 UNITS INTO THE SKIN AT BEDTIME.  08/20/2022: Currently 18 units daily   metFORMIN (GLUCOPHAGE) 1000 MG tablet Take 1 tablet (1,000 mg total) by mouth 2 (two) times daily with a meal.    NURTEC 75 MG TBDP Take 1 tablet by mouth as directed.    rosuvastatin (CRESTOR) 40 MG tablet TAKE 1 TABLET BY MOUTH EVERYDAY AT BEDTIME    Semaglutide,0.25 or 0.5MG/DOS, (OZEMPIC, 0.25 OR 0.5 MG/DOSE,) 2 MG/3ML SOPN Inject 0.5 mg into the skin once a week. [place] Patient Assistance 08/20/2022: Has not started yet.   topiramate (TOPAMAX) 100 MG tablet Take 100 mg by mouth 2 (two) times daily.    No facility-administered encounter medications on file as of 08/26/2022.    [name], CPA/CMA Clinical Pharmacist Assistant Phone: [phone]

## 2022-08-30 DIAGNOSIS — E785 Hyperlipidemia, unspecified: Secondary | ICD-10-CM

## 2022-08-30 DIAGNOSIS — E1159 Type 2 diabetes mellitus with other circulatory complications: Secondary | ICD-10-CM

## 2022-08-30 DIAGNOSIS — I10 Essential (primary) hypertension: Secondary | ICD-10-CM | POA: Diagnosis not present

## 2022-08-30 DIAGNOSIS — F17210 Nicotine dependence, cigarettes, uncomplicated: Secondary | ICD-10-CM | POA: Diagnosis not present

## 2022-08-30 DIAGNOSIS — Z794 Long term (current) use of insulin: Secondary | ICD-10-CM

## 2022-09-02 ENCOUNTER — Telehealth: Payer: Self-pay

## 2022-09-02 NOTE — Progress Notes (Signed)
    Chronic Care Management Pharmacy Assistant   Name: Donald [name]  MRN: [id number] DOB: 1966/01/29  Reason for Encounter: Mediation updated Insulin Instructions.   I spoke with the patient and he reports that since he has increased his insulin to 20 units his blood sugars are still running around 220-225.  CPP notified of the numbers, and advised that patient should increase his insulin to 24 units daily.  I tried calling the patient to give him the instructions, but I had to LVM requesting the patient to return my call.  Patient called me back and I instructed the patient to increase his Levemir to 24 units. Patient verbalized understanding. Medications: Outpatient Encounter Medications as of 09/02/2022  Medication Sig Note   aspirin EC 81 MG EC tablet Take 1 tablet (81 mg total) by mouth daily.    Blood Glucose Monitoring Suppl (D-CARE GLUCOMETER) w/Device KIT 1 Units by Does not apply route 4 (four) times daily -  before meals and at bedtime.    candesartan (ATACAND) 8 MG tablet Take 8 mg by mouth daily.    clopidogrel (PLAVIX) 75 MG tablet TAKE 1 TABLET BY MOUTH EVERY DAY    insulin degludec (TRESIBA FLEXTOUCH) 200 UNIT/ML FlexTouch Pen Inject 16-48 units in the skin nightly. [place] Patient Assistance 08/20/2022: Has not started yet.   Insulin Pen Needle (BD PEN NEEDLE MICRO U/F) 32G X 6 MM MISC USE FOR LEVEMIR INJECTION DAILY    LEVEMIR FLEXTOUCH 100 UNIT/ML FlexTouch Pen INJECT 15-30 UNITS INTO THE SKIN AT BEDTIME. 08/27/2022: Currently 20 units daily   metFORMIN (GLUCOPHAGE) 1000 MG tablet Take 1 tablet (1,000 mg total) by mouth 2 (two) times daily with a meal.    NURTEC 75 MG TBDP Take 1 tablet by mouth as directed.    rosuvastatin (CRESTOR) 40 MG tablet TAKE 1 TABLET BY MOUTH EVERYDAY AT BEDTIME    Semaglutide,0.25 or 0.5MG /DOS, (OZEMPIC, 0.25 OR 0.5 MG/DOSE,) 2 MG/3ML SOPN Inject 0.5 mg into the skin once a week. [place] Patient Assistance 08/20/2022: Has not  started yet.   topiramate (TOPAMAX) 100 MG tablet Take 100 mg by mouth 2 (two) times daily.    No facility-administered encounter medications on file as of 09/02/2022.    [name], CPA/CMA Clinical Pharmacist Assistant Phone: [phone]

## 2022-09-10 ENCOUNTER — Telehealth: Payer: Medicare HMO

## 2022-09-10 NOTE — Progress Notes (Deleted)
Chronic Care Management Pharmacy Note  09/10/2022 Name:  Donald [name] MRN:  [id number] DOB:  1966/09/20  Summary: Patient presents for initial CCM consult.   -Unable to afford Lovie Macadamia, or Trulicity.   -Questionable adherence to metformin, states he takes twice daily but has not filled since Oct 2022. States he has excess supply from early fills from pharmacy. Not interested in changing pharmacy.   -Only taking Farxiga samples once weekly, thought it was like trulicity.   Recommendations/Changes made from today's visit: -Finish supply of Farxiga, then STOP. Will consider restarting once blood sugars improve.  -INCREASE Levemir to 18 units daily.  -START PAP for Ozempic 0.5 mg weekly + Tresiba 48 units daily   Plan: CPP follow-up 1 month  HC DM assessment every 3-4 days for insulin titration.   Subjective: Donald [name] is an 56 y.o. year old male who is a primary patient of Delsa Grana, [place].  The CCM team was consulted for assistance with disease management and care coordination needs.    Engaged with patient by telephone for follow up visit in response to provider referral for pharmacy case management and/or care coordination services.   Consent to Services:  The patient was given information about Chronic Care Management services, agreed to services, and gave verbal consent prior to initiation of services.  Please see initial visit note for detailed documentation.   Patient Care Team: Delsa Grana, PA-C as PCP - General (Family Medicine) Germaine Pomfret, [facility] (Pharmacist)  Recent office visits: 07/17/22: Patient presented to Delsa Grana, PA-C for follow-up. A1c 13.8%.  07/08/22: Patient presented to Delsa Grana, PA-C for follow-up.  Recent consult visits: 04/22/22: Patient presented to Dr. Manuella Ghazi (neurology) for follow-up.  03/18/22: Patient presented to Dr. Lucky Cowboy (vascular) for follow-up.   Hospital visits: None in previous 6 months  Objective:  Lab  Results  Component Value Date   CREATININE 1.07 07/08/2022   BUN 13 07/08/2022   GFRNONAA 65 03/26/2021   GFRAA 76 03/26/2021   NA 141 07/08/2022   K 4.7 07/08/2022   CALCIUM 10.0 07/08/2022   CO2 25 07/08/2022    Lab Results  Component Value Date/Time   HGBA1C 13.8 (H) 07/08/2022 09:33 AM   HGBA1C 10.6 (H) 04/07/2022 09:10 AM   MICROALBUR 0.7 07/08/2022 09:33 AM   MICROALBUR 1.3 01/17/2020 10:27 AM   MICROALBUR NEG 07/31/2016 04:14 PM    Last diabetic Eye exam:  Lab Results  Component Value Date/Time   HMDIABEYEEXA No Retinopathy 04/23/2022 12:00 AM    Last diabetic Foot exam: No results found for: "HMDIABFOOTEX"   Lab Results  Component Value Date   CHOL 115 04/07/2022   HDL 40 04/07/2022   LDLCALC 52 04/07/2022   TRIG 142 04/07/2022   CHOLHDL 2.9 04/07/2022       Latest Ref Rng & Units 07/08/2022    9:33 AM 04/07/2022    9:10 AM 07/08/2021    3:22 PM  Hepatic Function  Total Protein 6.1 - 8.1 g/dL 7.4  7.6  7.3   AST 10 - 35 U/L _0 ALT 9 - 46 U/L _1 Total Bilirubin 0.2 - 1.2 mg/dL 0.2  0.2  0.2     Lab Results  Component Value Date/Time   TSH 0.73 04/15/2019 08:43 AM   TSH 0.44 03/27/2017 10:50 AM       Latest Ref Rng & Units 07/08/2022    9:33 AM 04/07/2022  9:10 AM 07/08/2021    3:22 PM  CBC  WBC 3.8 - 10.8 Thousand/uL 9.2  10.7  11.1   Hemoglobin 13.2 - 17.1 g/dL 13.3  11.4  13.1   Hematocrit 38.5 - 50.0 % 41.2  34.6  38.8   Platelets 140 - 400 Thousand/uL 253  299  284     Lab Results  Component Value Date/Time   VD25OH 63 07/12/2020 03:39 PM   VD25OH 14 (L) 01/17/2020 10:27 AM    Clinical ASCVD: Yes  The ASCVD Risk score (Arnett DK, et al., 2019) failed to calculate for the following reasons:   The valid total cholesterol range is 130 to 320 mg/dL       07/17/2022    9:21 AM 07/08/2022    8:45 AM 04/07/2022    8:40 AM  Depression screen PHQ 2/9  Decreased Interest 0 0 0  Down, Depressed, Hopeless 0 0 0  PHQ - 2 Score 0 0  0  Altered sleeping 0 0 0  Tired, decreased energy 0 0 0  Change in appetite 0 0 0  Feeling bad or failure about yourself  0 0 0  Trouble concentrating 0 0 0  Moving slowly or fidgety/restless 0 0 0  Suicidal thoughts 0 0 0  PHQ-9 Score 0 0 0  Difficult doing work/chores Not difficult at all Not difficult at all Not difficult at all     Social History   Tobacco Use  Smoking Status Every Day   Packs/day: 0.50   Years: 30.00   Total pack years: 15.00   Types: Cigarettes  Smokeless Tobacco Never   BP Readings from Last 3 Encounters:  07/17/22 118/66  07/08/22 118/64  04/07/22 122/64   Pulse Readings from Last 3 Encounters:  07/17/22 99  07/08/22 97  04/07/22 94   Wt Readings from Last 3 Encounters:  07/17/22 214 lb 6.4 oz (97.3 kg)  07/08/22 217 lb 9.6 oz (98.7 kg)  04/07/22 230 lb 6.4 oz (104.5 kg)    Assessment/Interventions: Review of patient past medical history, allergies, medications, health status, including review of consultants reports, laboratory and other test data, was performed as part of comprehensive evaluation and provision of chronic care management services.   SDOH:  (Social Determinants of Health) assessments and interventions performed: Yes SDOH Interventions    Flowsheet Row Chronic Care Management from 07/25/2021 in [place] [facility] from 04/24/2021 in [facility]  SDOH Interventions    Financial Strain Interventions Patient Refused Other (Comment)  [PAP]       CCM Care Plan  Allergies  Allergen Reactions   No Known Allergies     Medications Reviewed Today     Reviewed by Germaine Pomfret, [facility] (Pharmacist) on 08/20/22 at 1041  Med List Status: <None>   Medication Order Taking? Sig Documenting Provider Last Dose Status Informant  aspirin EC 81 MG EC tablet [id number] Yes Take 1 tablet (81 mg total) by mouth daily. Hillary Bow, MD Taking Active Self  Blood Glucose Monitoring  Suppl (D-CARE GLUCOMETER) w/Device KIT [id number]  1 Units by Does not apply route 4 (four) times daily -  before meals and at bedtime. Hillary Bow, MD  Active Self  candesartan (ATACAND) 8 MG tablet [id number] Yes Take 8 mg by mouth daily. [provider] Taking Active   clopidogrel (PLAVIX) 75 MG tablet [id number] Yes TAKE 1 TABLET BY MOUTH EVERY DAY Fayrene Helper, MD Taking Active   insulin degludec (  TRESIBA FLEXTOUCH) 200 UNIT/ML FlexTouch Pen [id number] Yes Inject 16-48 units in the skin nightly. [place] Patient Assistance [place], Kristeen Miss, PA-C  Active            Med Note Michaelle Birks, Tovah Slavick A   Wed Aug 20, 2022 10:41 AM) Has not started yet.  Insulin Pen Needle (BD PEN NEEDLE MICRO U/F) 32G X 6 MM MISC [id number]  USE FOR LEVEMIR INJECTION DAILY Delsa Grana, PA-C  Active   LEVEMIR FLEXTOUCH 100 UNIT/ML FlexTouch Pen [id number] Yes INJECT 15-30 UNITS INTO THE SKIN AT BEDTIME. Myles Gip, DO Taking Active            Med Note Michaelle Birks, Daekwon Beswick A   Wed Aug 20, 2022 10:29 AM) Currently 18 units daily  metFORMIN (GLUCOPHAGE) 1000 MG tablet [id number] Yes Take 1 tablet (1,000 mg total) by mouth 2 (two) times daily with a meal. Delsa Grana, PA-C Taking Active   NURTEC 75 MG TBDP [id number] Yes Take 1 tablet by mouth as directed. [provider] Taking Active   rosuvastatin (CRESTOR) 40 MG tablet [id number] Yes TAKE 1 TABLET BY MOUTH EVERYDAY AT BEDTIME Delsa Grana, PA-C Taking Active   Semaglutide,0.25 or 0.5MG/DOS, (OZEMPIC, 0.25 OR 0.5 MG/DOSE,) 2 MG/3ML SOPN [id number] Yes Inject 0.5 mg into the skin once a week. [place] Patient Assistance [place], Kristeen Miss, PA-C  Active            Med Note Michaelle Birks, Kayonna Lawniczak A   Wed Aug 20, 2022 10:41 AM) Has not started yet.  topiramate (TOPAMAX) 100 MG tablet [id number] Yes Take 100 mg by mouth 2 (two) times daily. 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   Class 1 obesity with serious comorbidity and body mass index (BMI) of 32.0 to 32.9 in adult 05/27/2018   Edema of esophagus    Angiodysplasia of stomach and duodenum    Stomach irritation    Heartburn    Current smoker 05/11/2018   Hypertension 09/15/2017   Hx of transient ischemic attack (TIA) 08/05/2017   Peripheral vascular insufficiency ([place]) 07/06/2017   Hyperlipidemia associated with type 2 diabetes mellitus ([place]) 10/31/2016    Immunization History  Administered Date(s) Administered   Influenza,inj,Quad PF,6+ Mos 10/31/2016, 08/27/2017, 09/29/2018, 09/27/2019, 07/17/2022   Influenza-Unspecified 10/31/2016, 08/27/2017, 09/29/2018, 09/27/2019   PFIZER(Purple Top)SARS-COV-2 Vaccination 03/10/2020, 04/04/2020   PNEUMOCOCCAL CONJUGATE-20 07/08/2021   Pneumococcal Polysaccharide-23 11/27/2017    Conditions to be addressed/monitored:  Hypertension, Hyperlipidemia, Diabetes, Tobacco use and Chronic Migraines, and History of TIA   There are no care plans that you recently modified to display for this patient.   Medication Assistance: Application for [place], Levemir, Trulicity  medication assistance program. in process.  Anticipated assistance start date 02/25/2021.  See plan of care for additional detail.  Patient's preferred pharmacy is:  [id number] [place] [id number]: [phone]: [phone] Uses pill box? Yes Pt endorses 100%  compliance  We  discussed: Current pharmacy is preferred with insurance plan and patient is satisfied with pharmacy services Patient decided to: Continue current medication management strategy  Care Plan and Follow Up Patient Decision:  Patient agrees to Care Plan and Follow-up.  Plan: Telephone follow up appointment with care management team member scheduled for:  09/10/22 at 8:30 AM  Malva Limes, [place] Pharmacist Practitioner  [facility] [phone]  Patient Care Plan: General Pharmacy (Adult)     Problem Identified: Hypertension, Hyperlipidemia, Diabetes, Tobacco use and Chronic Migraines, and History of TIA   Priority: High     Long-Range Goal: Patient-Specific Goal   Start Date: 08/20/2022  Expected End Date: 08/21/2023  This Visit's Progress: On track  Recent Progress: On track  Priority: High  Note:       Current Barriers:  Unable to achieve control of diabetes   Pharmacist Clinical Goal(s):  Over the next 90 days, patient will achieve control of diabetes as evidenced by A1c less than 8% through collaboration with PharmD and provider.   Interventions: 1:1 collaboration with Delsa Grana, PA-C regarding development and update of comprehensive plan of care as evidenced by provider attestation and co-signature Inter-disciplinary care team collaboration (see longitudinal plan of care) Comprehensive medication review performed; medication list updated in electronic medical record  Hypertension (BP goal <130/80) -Controlled -Current treatment: Candesartan 8 mg daily  -Medications previously tried: NA  -Current home readings: NA -Recommended to continue current medication  Hyperlipidemia: (LDL goal < 55) -History of TIA -Controlled -Current treatment: Rosuvastatin 40 mg daily  -Medications previously tried: NA  -Educated on Importance of limiting foods high in cholesterol; -Continue current medications  Diabetes (A1c goal <7%) -Diagnosed  2019  -Uncontrolled -Current medications: Levemir 24 units daily  Metformin 1000 mg twice daily  -Medications previously tried: Geneticist, molecular ([place])  -Current home glucose readings fasting glucose:  post prandial glucose: NA -Denies hypoglycemic/hyperglycemic symptoms  Chronic Migraines (Goal: Prevent migraines) -Managed by Dr. Manuella Ghazi  -Uncontrolled -Current treatment  Nurtec 75 mg daily as needed  Topiramate 100 mg twice daily  -Medications previously tried: [employer]   -Recommended to continue current medication  Patient Goals/Self-Care Activities Over the next 90 days, patient will:  - check glucose daily (before breakfast), document, and provide at future appointments check blood pressure weekly, document, and provide at future appointments  Follow Up Plan: ***

## 2022-09-16 ENCOUNTER — Ambulatory Visit (INDEPENDENT_AMBULATORY_CARE_PROVIDER_SITE_OTHER): Payer: Medicare HMO | Admitting: Vascular Surgery

## 2022-09-16 ENCOUNTER — Encounter (INDEPENDENT_AMBULATORY_CARE_PROVIDER_SITE_OTHER): Payer: Medicare Other

## 2022-09-17 ENCOUNTER — Telehealth: Payer: Self-pay

## 2022-09-17 NOTE — Progress Notes (Signed)
    Chronic Care Management Pharmacy Assistant   Name: Donald [name]  MRN: [id number] DOB: 04-04-1966  Reason for Encounter: Unenrollment from Program   I contacted the patient to see if we could get him rescheduled for his missed appointment and he stated he would prefer to follow-up with PCP and be disenrolled from the [facility] program.  CPP has been notified. Patient has been unenrolled.   Medications: Outpatient Encounter Medications as of 09/17/2022  Medication Sig Note   aspirin EC 81 MG EC tablet Take 1 tablet (81 mg total) by mouth daily.    Blood Glucose Monitoring Suppl (D-CARE GLUCOMETER) w/Device KIT 1 Units by Does not apply route 4 (four) times daily -  before meals and at bedtime.    candesartan (ATACAND) 8 MG tablet Take 8 mg by mouth daily.    clopidogrel (PLAVIX) 75 MG tablet TAKE 1 TABLET BY MOUTH EVERY DAY    insulin degludec (TRESIBA FLEXTOUCH) 200 UNIT/ML FlexTouch Pen Inject 16-48 units in the skin nightly. [place] Patient Assistance 08/20/2022: Has not started yet.   Insulin Pen Needle (BD PEN NEEDLE MICRO U/F) 32G X 6 MM MISC USE FOR LEVEMIR INJECTION DAILY    LEVEMIR FLEXTOUCH 100 UNIT/ML FlexTouch Pen INJECT 15-30 UNITS INTO THE SKIN AT BEDTIME. 09/03/2022: Currently 24 units daily   metFORMIN (GLUCOPHAGE) 1000 MG tablet Take 1 tablet (1,000 mg total) by mouth 2 (two) times daily with a meal.    NURTEC 75 MG TBDP Take 1 tablet by mouth as directed.    rosuvastatin (CRESTOR) 40 MG tablet TAKE 1 TABLET BY MOUTH EVERYDAY AT BEDTIME    Semaglutide,0.25 or 0.5MG /DOS, (OZEMPIC, 0.25 OR 0.5 MG/DOSE,) 2 MG/3ML SOPN Inject 0.5 mg into the skin once a week. [place] Patient Assistance 08/20/2022: Has not started yet.   topiramate (TOPAMAX) 100 MG tablet Take 100 mg by mouth 2 (two) times daily.    No facility-administered encounter medications on file as of 09/17/2022.    [name], CPA/CMA Clinical Pharmacist Assistant Phone: [phone]

## 2022-09-29 ENCOUNTER — Encounter (INDEPENDENT_AMBULATORY_CARE_PROVIDER_SITE_OTHER): Payer: Self-pay

## 2022-10-09 ENCOUNTER — Encounter: Payer: Self-pay | Admitting: Family Medicine

## 2022-10-09 ENCOUNTER — Ambulatory Visit (INDEPENDENT_AMBULATORY_CARE_PROVIDER_SITE_OTHER): Payer: Medicare HMO | Admitting: Family Medicine

## 2022-10-09 VITALS — BP 112/66 | HR 99 | Temp 98.1°F | Resp 16 | Ht 71.0 in | Wt 215.8 lb

## 2022-10-09 DIAGNOSIS — Z794 Long term (current) use of insulin: Secondary | ICD-10-CM

## 2022-10-09 DIAGNOSIS — E1169 Type 2 diabetes mellitus with other specified complication: Secondary | ICD-10-CM | POA: Diagnosis not present

## 2022-10-09 DIAGNOSIS — E1165 Type 2 diabetes mellitus with hyperglycemia: Secondary | ICD-10-CM

## 2022-10-09 DIAGNOSIS — E669 Obesity, unspecified: Secondary | ICD-10-CM | POA: Diagnosis not present

## 2022-10-09 DIAGNOSIS — E119 Type 2 diabetes mellitus without complications: Secondary | ICD-10-CM | POA: Diagnosis not present

## 2022-10-09 MED ORDER — SEMAGLUTIDE(0.25 OR 0.5MG/DOS) 2 MG/3ML ~~LOC~~ SOPN: PEN_INJECTOR | SUBCUTANEOUS | 1 refills | Status: AC

## 2022-10-09 NOTE — Assessment & Plan Note (Signed)
See below Lab Results  Component Value Date   HGBA1C 13.8 (H) 07/08/2022  Pt has been difficult to work with despite CCM pharmacy efforts from [place] - we have discussed this personally Educated pt today about IDDM, T2DM, explained various meds and how they work and also discussed different factors of disease control - genetics, diet, meds etc Reviewed significant risk of having high sugars and A1C -including but not limited to stroke, heart attack, infections, kidney disease, vision damage, neuropathy, wounds/infections I reiterated that our goal is to help him be healthy since he can only afford certain medications now those medication doses will need to be adjusted to get his blood sugars at goal and A1c at goal I commended him for all of his diet and lifestyle efforts and explained that there may be a limit to what his diet lifestyle efforts can do to influence glycemic control and then we just have to adjust medications at that point and its not because he is done anything wrong is just different now than it used to be and that's not his fault.

## 2022-10-09 NOTE — Assessment & Plan Note (Signed)
Pts lowest glucose is reported today to be 200 I explained how he needs to increase levemir dose and titrate that to a goal of 80-150 fasting glucose  Explained how it takes only 3-5 d to know if dose adjustment is needed again Pt instructed to increase to 25 units daily if sugars are still always over 200 Continue his diet/lifestyle efforts - doing well to cut out sugary drinks Unfortunately its been hard to get GLP1 again that he can afford - but sent in ozempic to pharmacy for him to find out price/coverage

## 2022-10-09 NOTE — Progress Notes (Signed)
Name: ANJELO PULLMAN   MRN: [id number]    DOB: November 26, 1966   Date:10/09/2022       Progress Note  Chief Complaint  Patient presents with   Follow-up   Diabetes     Subjective:   Donald [name] is a 56 y.o. male, presents to clinic for f/up  DM:   Doing levemir 20 units at bedtime, metformin 1000 MG BID, and has been noticing a difference in sugars with better diet efforts, fasting sugars are still ~ 200 Denies: Polyuria, polydipsia, vision changes, neuropathy, hypoglycemia Recent pertinent labs: Lab Results  Component Value Date   HGBA1C 13.8 (H) 07/08/2022   HGBA1C 10.6 (H) 04/07/2022   HGBA1C 6.9 (A) 10/08/2021   Lab Results  Component Value Date   MICROALBUR 0.7 07/08/2022   LDLCALC 52 04/07/2022   CREATININE 1.07 07/08/2022   Standard of care and health maintenance: Urine Microalbumin:  done 3 months ago -good Foot exam:  UTD DM eye exam:  recently done ACEI/ARB:  yes Statin:  yes   He has worse A1C when insurance no longer covered trulicity, could not afford, he has not gotten tresiba or ozempic and was previously working with [employer], but are not longer working together. Pt states they were calling him too often and it wasn't long enough for meds to work. I explained how insulin titration works and how he can see a difference in only a few days following a levemir dose change and they were just trying to help get his sugars in a safer range  HTN on caandesartan, BP at goal today BP Readings from Last 3 Encounters:  10/09/22 112/66  07/17/22 118/66  07/08/22 118/64   HLD on crestor 40 Lab Results  Component Value Date   CHOL 115 04/07/2022   HDL 40 04/07/2022   LDLCALC 52 04/07/2022   TRIG 142 04/07/2022   CHOLHDL 2.9 04/07/2022   HA's per specialists - a little better now managed on nurtec    Current Outpatient Medications:    aspirin EC 81 MG EC tablet, Take 1 tablet (81 mg total) by mouth daily., Disp: , Rfl:    Blood Glucose Monitoring  Suppl (D-CARE GLUCOMETER) w/Device KIT, 1 Units by Does not apply route 4 (four) times daily -  before meals and at bedtime., Disp: 1 kit, Rfl: 0   candesartan (ATACAND) 8 MG tablet, Take 8 mg by mouth daily., Disp: , Rfl:    clopidogrel (PLAVIX) 75 MG tablet, TAKE 1 TABLET BY MOUTH EVERY DAY, Disp: 90 tablet, Rfl: 2   Insulin Pen Needle (BD PEN NEEDLE MICRO U/F) 32G X 6 MM MISC, USE FOR LEVEMIR INJECTION DAILY, Disp: 100 each, Rfl: 5   LEVEMIR FLEXTOUCH 100 UNIT/ML FlexTouch Pen, INJECT 15-30 UNITS INTO THE SKIN AT BEDTIME., Disp: 15 mL, Rfl: 3   metFORMIN (GLUCOPHAGE) 1000 MG tablet, Take 1 tablet (1,000 mg total) by mouth 2 (two) times daily with a meal., Disp: 180 tablet, Rfl: 3   NURTEC 75 MG TBDP, Take 1 tablet by mouth as directed., Disp: , Rfl:    rosuvastatin (CRESTOR) 40 MG tablet, TAKE 1 TABLET BY MOUTH EVERYDAY AT BEDTIME, Disp: 90 tablet, Rfl: 3   topiramate (TOPAMAX) 100 MG tablet, Take 100 mg by mouth 2 (two) times daily., Disp: , Rfl:    insulin degludec (TRESIBA FLEXTOUCH) 200 UNIT/ML FlexTouch Pen, Inject 16-48 units in the skin nightly. Via [employer] Patient Assistance (Patient not taking: Reported on 10/09/2022), Disp: , Rfl:  Semaglutide,0.25 or 0.5MG/DOS, (OZEMPIC, 0.25 OR 0.5 MG/DOSE,) 2 MG/3ML SOPN, Inject 0.5 mg into the skin once a week. 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   Class 1 obesity with serious  comorbidity and body mass index (BMI) of 32.0 to 32.9 in adult 05/27/2018   Edema of esophagus    Angiodysplasia of stomach and duodenum    Stomach irritation    Heartburn    Current smoker 05/11/2018   Hypertension 09/15/2017   Hx of transient ischemic attack (TIA) 08/05/2017   Peripheral vascular insufficiency ([place]) 07/06/2017   Hyperlipidemia associated with type 2 diabetes mellitus ([place]) 10/31/2016    Past Surgical History:  Procedure Laterality Date   ESOPHAGOGASTRODUODENOSCOPY (EGD) WITH PROPOFOL N/A 05/26/2018   Procedure: ESOPHAGOGASTRODUODENOSCOPY (EGD) WITH PROPOFOL;  Surgeon: Virgel Manifold, MD;  Location: [facility] ENDOSCOPY;  Service: Endoscopy;  Laterality: N/A;   KNEE SURGERY Right     Family History  Problem Relation Age of Onset   Hypertension Father    Diabetes Father    Liver disease Father    Diverticulitis Mother    Migraines Mother    Cancer Sister        tumors in the breast    Diabetes Maternal Grandfather    Cancer Sister        tumors in the breast    Cancer Sister        tumor in the breast     Social History   Tobacco Use   Smoking status: Every Day    Packs/day: 0.50    Years: 30.00    Total pack years: 15.00    Types: Cigarettes   Smokeless tobacco: Never  Vaping Use   Vaping Use: Never used  Substance Use Topics   Alcohol use: Yes    Alcohol/week: 0.0 standard drinks of alcohol    Comment: none in the last 3 weeks   Drug use: No     Allergies  Allergen Reactions   No Known Allergies     Health Maintenance  Topic Date Due   Medicare Annual Wellness (AWV)  Never done   Zoster Vaccines- Shingrix (1 of 2) Never done   COVID-19 Vaccine (3 - Pfizer series) 05/30/2020   HEMOGLOBIN A1C  01/08/2023   FOOT EXAM  04/08/2023   OPHTHALMOLOGY EXAM  04/24/2023   Diabetic kidney evaluation - GFR measurement  07/09/2023   Diabetic kidney evaluation - Urine ACR  07/09/2023   COLONOSCOPY (Pts 45-106yr Insurance coverage will need to be  confirmed)  12/01/2024   INFLUENZA VACCINE  Completed   Hepatitis C Screening  Completed   HIV Screening  Completed   HPV VACCINES  Aged Out   TETANUS/TDAP  Discontinued    Chart Review Today: I personally reviewed active problem list, medication list, allergies, family history, social history, health maintenance, notes from last encounter, lab results, imaging with the patient/caregiver today.   Review of Systems  Constitutional: Negative.   HENT: Negative.  Eyes: Negative.   Respiratory: Negative.    Cardiovascular: Negative.   Gastrointestinal: Negative.   Endocrine: Negative.   Genitourinary: Negative.   Musculoskeletal: Negative.   Skin: Negative.   Allergic/Immunologic: Negative.   Neurological: Negative.   Hematological: Negative.   Psychiatric/Behavioral: Negative.    All other systems reviewed and are negative.    Objective:   Vitals:   10/09/22 0915  BP: 112/66  Pulse: 99  Resp: 16  Temp: 98.1 F (36.7 C)  TempSrc: Oral  SpO2: 98%  Weight: 215 lb 12.8 oz (97.9 kg)  Height: _0  (1.803 m)    Body mass index is 30.1 kg/m.  Physical Exam Vitals and nursing note reviewed.  Constitutional:      General: He is not in acute distress.    Appearance: Normal appearance. He is obese. He is not ill-appearing, toxic-appearing or diaphoretic.  HENT:     Head: Normocephalic and atraumatic.     Right Ear: External ear normal.     Left Ear: External ear normal.     Nose: Nose normal.  Eyes:     General: No scleral icterus.       Right eye: No discharge.        Left eye: No discharge.     Conjunctiva/sclera: Conjunctivae normal.  Cardiovascular:     Rate and Rhythm: Normal rate and regular rhythm.     Pulses: Normal pulses.     Heart sounds: Normal heart sounds. No murmur heard.    No friction rub. No gallop.  Pulmonary:     Effort: Pulmonary effort is normal. No respiratory distress.     Breath sounds: Normal breath sounds. No stridor. No wheezing,  rhonchi or rales.  Neurological:     Mental Status: He is alert. Mental status is at baseline.         Assessment & Plan:   Problem List Items Addressed This Visit       Endocrine   Uncontrolled type 2 diabetes mellitus with hyperglycemia ([place]) - Primary    See below Lab Results  Component Value Date   HGBA1C 13.8 (H) 07/08/2022  Pt has been difficult to work with despite CCM pharmacy efforts from [place] - we have discussed this personally Educated pt today about IDDM, T2DM, explained various meds and how they work and also discussed different factors of disease control - genetics, diet, meds etc Reviewed significant risk of having high sugars and A1C -including but not limited to stroke, heart attack, infections, kidney disease, vision damage, neuropathy, wounds/infections I reiterated that our goal is to help him be healthy since he can only afford certain medications now those medication doses will need to be adjusted to get his blood sugars at goal and A1c at goal I commended him for all of his diet and lifestyle efforts and explained that there may be a limit to what his diet lifestyle efforts can do to influence glycemic control and then we just have to adjust medications at that point and its not because he is done anything wrong is just different now than it used to be and that's not his fault.      Relevant Medications   Semaglutide,0.25 or 0.5MG/DOS, 2 MG/3ML SOPN   Other Relevant Orders   Hemoglobin A1c   COMPLETE METABOLIC PANEL WITH GFR   Insulin dependent type 2 diabetes mellitus ([place])    Pts lowest glucose is reported today to be 200 I explained how he needs to increase levemir dose  and titrate that to a goal of 80-150 fasting glucose  Explained how it takes only 3-5 d to know if dose adjustment is needed again Pt instructed to increase to 25 units daily if sugars are still always over 200 Continue his diet/lifestyle efforts - doing well to cut out sugary  drinks Unfortunately its been hard to get GLP1 again that he can afford - but sent in ozempic to pharmacy for him to find out price/coverage      Relevant Medications   Semaglutide,0.25 or 0.5MG/DOS, 2 MG/3ML SOPN   Other Relevant Orders   Hemoglobin A1c   COMPLETE METABOLIC PANEL WITH GFR     Return for 1 month f/up with glucometer in office .   Delsa Grana, PA-C 10/09/22 9:54 AM

## 2022-10-10 LAB — COMPLETE METABOLIC PANEL WITH GFR
AG Ratio: 1.3 (calc) (ref 1.0–2.5)
ALT: 13 U/L (ref 9–46)
AST: 12 U/L (ref 10–35)
Albumin: 4.3 g/dL (ref 3.6–5.1)
Alkaline phosphatase (APISO): 81 U/L (ref 35–144)
BUN: 19 mg/dL (ref 7–25)
CO2: 23 mmol/L (ref 20–32)
Calcium: 9.6 mg/dL (ref 8.6–10.3)
Chloride: 101 mmol/L (ref 98–110)
Creat: 1.08 mg/dL (ref 0.70–1.30)
Globulin: 3.3 g/dL (calc) (ref 1.9–3.7)
Glucose, Bld: 343 mg/dL — ABNORMAL HIGH (ref 65–99)
Potassium: 4.7 mmol/L (ref 3.5–5.3)
Sodium: 135 mmol/L (ref 135–146)
Total Bilirubin: 0.3 mg/dL (ref 0.2–1.2)
Total Protein: 7.6 g/dL (ref 6.1–8.1)
eGFR: 80 mL/min/{1.73_m2} (ref >=60)

## 2022-10-10 LAB — HEMOGLOBIN A1C
Hgb A1c MFr Bld: 12.7 % of total Hgb — ABNORMAL HIGH (ref <5.7)
Mean Plasma Glucose: 318 mg/dL
eAG (mmol/L): 17.6 mmol/L

## 2022-11-10 ENCOUNTER — Ambulatory Visit: Payer: Medicare HMO | Admitting: Family Medicine

## 2022-11-10 DIAGNOSIS — E1169 Type 2 diabetes mellitus with other specified complication: Secondary | ICD-10-CM

## 2022-11-10 DIAGNOSIS — E1165 Type 2 diabetes mellitus with hyperglycemia: Secondary | ICD-10-CM

## 2022-11-10 DIAGNOSIS — E119 Type 2 diabetes mellitus without complications: Secondary | ICD-10-CM

## 2023-02-23 ENCOUNTER — Other Ambulatory Visit: Payer: Self-pay | Admitting: Family Medicine

## 2023-02-23 DIAGNOSIS — E1165 Type 2 diabetes mellitus with hyperglycemia: Secondary | ICD-10-CM

## 2023-02-23 DIAGNOSIS — E1151 Type 2 diabetes mellitus with diabetic peripheral angiopathy without gangrene: Secondary | ICD-10-CM

## 2023-03-03 LAB — HEMOGLOBIN A1C: Hemoglobin A1C: 11.3

## 2023-03-03 LAB — HM DIABETES FOOT EXAM: HM Diabetic Foot Exam: NORMAL

## 2023-03-14 ENCOUNTER — Other Ambulatory Visit (INDEPENDENT_AMBULATORY_CARE_PROVIDER_SITE_OTHER): Payer: Self-pay | Admitting: Family Medicine

## 2023-06-12 ENCOUNTER — Other Ambulatory Visit: Payer: Self-pay | Admitting: Family Medicine

## 2023-06-12 DIAGNOSIS — E1169 Type 2 diabetes mellitus with other specified complication: Secondary | ICD-10-CM

## 2023-06-12 NOTE — Telephone Encounter (Signed)
Pt needs f/u appt

## 2023-06-12 NOTE — Telephone Encounter (Signed)
Tried to contact pt to schedule appt no answer.

## 2023-06-16 ENCOUNTER — Encounter: Payer: Self-pay | Admitting: Family Medicine

## 2023-06-16 ENCOUNTER — Ambulatory Visit (INDEPENDENT_AMBULATORY_CARE_PROVIDER_SITE_OTHER): Payer: Medicare Other | Admitting: Family Medicine

## 2023-06-16 VITALS — BP 130/72 | HR 99 | Temp 98.4°F | Resp 16 | Ht 71.0 in | Wt 207.5 lb

## 2023-06-16 DIAGNOSIS — R519 Headache, unspecified: Secondary | ICD-10-CM

## 2023-06-16 DIAGNOSIS — I10 Essential (primary) hypertension: Secondary | ICD-10-CM | POA: Diagnosis not present

## 2023-06-16 DIAGNOSIS — Z6832 Body mass index (BMI) 32.0-32.9, adult: Secondary | ICD-10-CM

## 2023-06-16 DIAGNOSIS — Z794 Long term (current) use of insulin: Secondary | ICD-10-CM | POA: Diagnosis not present

## 2023-06-16 DIAGNOSIS — E669 Obesity, unspecified: Secondary | ICD-10-CM

## 2023-06-16 DIAGNOSIS — E1169 Type 2 diabetes mellitus with other specified complication: Secondary | ICD-10-CM | POA: Diagnosis not present

## 2023-06-16 DIAGNOSIS — Z5181 Encounter for therapeutic drug level monitoring: Secondary | ICD-10-CM | POA: Diagnosis not present

## 2023-06-16 DIAGNOSIS — D649 Anemia, unspecified: Secondary | ICD-10-CM | POA: Diagnosis not present

## 2023-06-16 DIAGNOSIS — E119 Type 2 diabetes mellitus without complications: Secondary | ICD-10-CM | POA: Diagnosis not present

## 2023-06-16 DIAGNOSIS — I739 Peripheral vascular disease, unspecified: Secondary | ICD-10-CM | POA: Diagnosis not present

## 2023-06-16 DIAGNOSIS — E1165 Type 2 diabetes mellitus with hyperglycemia: Secondary | ICD-10-CM

## 2023-06-16 DIAGNOSIS — E785 Hyperlipidemia, unspecified: Secondary | ICD-10-CM

## 2023-06-16 DIAGNOSIS — G8929 Other chronic pain: Secondary | ICD-10-CM

## 2023-06-16 MED ORDER — OZEMPIC (0.25 OR 0.5 MG/DOSE) 2 MG/3ML ~~LOC~~ SOPN
PEN_INJECTOR | SUBCUTANEOUS | 0 refills | Status: DC
Start: 2023-06-16 — End: 2023-06-17

## 2023-06-16 NOTE — Progress Notes (Signed)
Name: Donald [name]   MRN: [id number]    DOB: 31-Jan-1966   Date:06/16/2023       Progress Note  Chief Complaint  Patient presents with   Follow-up   Hypertension   Hyperlipidemia   Diabetes     Subjective:   Donald [name] is a 57 y.o. male, presents to clinic for routine f/up  He has not f/up in office in over 8 months or so, multiple uncontrolled conditions, most concerning including uncontrolled diabetes with last A1C in clinic in November 12.7.  we planned to adjust insulin and f/up closely in office but pt was a no show in December. When he sent refill requests he was asked to come in for his OV in order to help him, but he still did not come in  DM:   Pt managing DM with metformin, levemir Denies: Polyuria, polydipsia, vision changes, neuropathy, hypoglycemia Recent pertinent labs: Lab Results  Component Value Date   HGBA1C 11.3 03/03/2023   HGBA1C 12.7 (H) 10/09/2022   HGBA1C 13.8 (H) 07/08/2022   Carotid stenosis - lost to f/up with vascular, off plavix due to running out of refills and missed an appt  Hyperlipidemia:   Currently treated with crestor 40 Last Lipids: Lab Results  Component Value Date   CHOL 115 04/07/2022   HDL 40 04/07/2022   LDLCALC 52 04/07/2022   TRIG 142 04/07/2022   CHOLHDL 2.9 04/07/2022   - Denies: Chest pain, shortness of breath, myalgias, claudication    Current Outpatient Medications:    aspirin EC 81 MG EC tablet, Take 1 tablet (81 mg total) by mouth daily., Disp: , Rfl:    Blood Glucose Monitoring Suppl (D-CARE GLUCOMETER) w/Device KIT, 1 Units by Does not apply route 4 (four) times daily -  before meals and at bedtime., Disp: 1 kit, Rfl: 0   candesartan (ATACAND) 8 MG tablet, Take 8 mg by mouth daily., Disp: , Rfl:    clopidogrel (PLAVIX) 75 MG tablet, TAKE 1 TABLET BY MOUTH EVERY DAY, Disp: 90 tablet, Rfl: 2   insulin detemir (LEVEMIR FLEXPEN) 100 UNIT/ML FlexPen, TAKE 15 TO 30 UNITS UNDER THE SKIN ONCE AT BEDTIME, Disp: 15  mL, Rfl: 0   Insulin Pen Needle (BD PEN NEEDLE MICRO U/F) 32G X 6 MM MISC, USE FOR LEVEMIR INJECTION DAILY, Disp: 100 each, Rfl: 5   metFORMIN (GLUCOPHAGE) 1000 MG tablet, Take 1 tablet (1,000 mg total) by mouth 2 (two) times daily with a meal., Disp: 180 tablet, Rfl: 3   NURTEC 75 MG TBDP, Take 1 tablet by mouth as directed., Disp: , Rfl:    rosuvastatin (CRESTOR) 40 MG tablet, TAKE 1 TABLET BY MOUTH EVERYDAY AT BEDTIME, Disp: 30 tablet, Rfl: 0   topiramate (TOPAMAX) 100 MG tablet, Take 100 mg by mouth 2 (two) times daily., Disp: , Rfl:   Patient Active Problem List   Diagnosis Date Noted   Intractable chronic migraine without aura and without status migrainosus 08/20/2022   Uncontrolled type 2 diabetes mellitus with hyperglycemia (HCC) 07/17/2022   Insulin dependent type 2 diabetes mellitus (HCC) 07/17/2022   Anemia 04/07/2022   Carotid stenosis 01/17/2022   Syncope with normal neurologic examination 03/28/2021   Chronic intractable headache 02/10/2020   Diabetes mellitus type 2 in obese 11/12/2018   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   Class 1 obesity with serious comorbidity and body mass index (BMI) of 32.0 to 32.9 in adult 05/27/2018   Edema of esophagus    Angiodysplasia of stomach  and duodenum    Stomach irritation    Heartburn    Current smoker 05/11/2018   Hypertension 09/15/2017   Hx of transient ischemic attack (TIA) 08/05/2017   Peripheral vascular insufficiency (HCC) 07/06/2017   Hyperlipidemia associated with type 2 diabetes mellitus (HCC) 10/31/2016    Past Surgical History:  Procedure Laterality Date   ESOPHAGOGASTRODUODENOSCOPY (EGD) WITH PROPOFOL N/A 05/26/2018   Procedure: ESOPHAGOGASTRODUODENOSCOPY (EGD) WITH PROPOFOL;  Surgeon: Pasty Spillers, MD;  Location: [facility] ENDOSCOPY;  Service: Endoscopy;  Laterality: N/A;   KNEE SURGERY Right     Family History  Problem Relation Age of Onset   Hypertension Father    Diabetes Father    Liver disease Father     Diverticulitis Mother    Migraines Mother    Cancer Sister        tumors in the breast    Diabetes Maternal Grandfather    Cancer Sister        tumors in the breast    Cancer Sister        tumor in the breast     Social History   Tobacco Use   Smoking status: Every Day    Current packs/day: 0.50    Average packs/day: 0.5 packs/day for 30.0 years (15.0 ttl pk-yrs)    Types: Cigarettes   Smokeless tobacco: Never  Vaping Use   Vaping status: Never Used  Substance Use Topics   Alcohol use: Yes    Alcohol/week: 0.0 standard drinks of alcohol    Comment: none in the last 3 weeks   Drug use: No     Allergies  Allergen Reactions   No Known Allergies     Health Maintenance  Topic Date Due   Medicare Annual Wellness (AWV)  Never done   DTaP/Tdap/Td (1 - Tdap) Never done   Zoster Vaccines- Shingrix (1 of 2) Never done   COVID-19 Vaccine (3 - 2023-24 season) 08/01/2022   OPHTHALMOLOGY EXAM  04/24/2023   Diabetic kidney evaluation - Urine ACR  07/09/2023   INFLUENZA VACCINE  07/02/2023   HEMOGLOBIN A1C  09/02/2023   Diabetic kidney evaluation - eGFR measurement  10/10/2023   FOOT EXAM  03/02/2024   Colonoscopy  12/01/2024   Hepatitis C Screening  Completed   HIV Screening  Completed   HPV VACCINES  Aged Out    Chart Review Today: I personally reviewed active problem list, medication list, allergies, family history, social history, health maintenance, notes from last encounter, lab results, imaging with the patient/caregiver today.  Review of Systems  Constitutional: Negative.   HENT: Negative.    Eyes: Negative.   Respiratory: Negative.    Cardiovascular: Negative.   Gastrointestinal: Negative.   Endocrine: Negative.   Genitourinary: Negative.   Musculoskeletal: Negative.   Skin: Negative.   Allergic/Immunologic: Negative.   Neurological: Negative.   Hematological: Negative.   Psychiatric/Behavioral: Negative.    All other systems reviewed and are negative.     Objective:   Vitals:   06/16/23 0912  BP: 130/72  Pulse: 99  Resp: 16  Temp: 98.4 F (36.9 C)  TempSrc: Oral  SpO2: 96%  Weight: 207 lb 8 oz (94.1 kg)  Height: 5\' 11"  (1.803 m)    Body mass index is 28.94 kg/m.  Physical Exam Vitals and nursing note reviewed.  Constitutional:      General: He is not in acute distress.    Appearance: Normal appearance. He is well-developed. He is obese. He is not ill-appearing, toxic-appearing or  diaphoretic.  HENT:     Head: Normocephalic and atraumatic.     Right Ear: External ear normal.     Left Ear: External ear normal.     Nose: Nose normal.  Eyes:     General:        Right eye: No discharge.        Left eye: No discharge.     Conjunctiva/sclera: Conjunctivae normal.  Neck:     Trachea: No tracheal deviation.  Cardiovascular:     Rate and Rhythm: Normal rate and regular rhythm.     Pulses: Normal pulses.     Heart sounds: Normal heart sounds.  Pulmonary:     Effort: Pulmonary effort is normal. No respiratory distress.     Breath sounds: Normal breath sounds. No stridor.  Musculoskeletal:        General: Normal range of motion.  Skin:    General: Skin is warm and dry.     Findings: No rash.  Neurological:     Mental Status: He is alert.     Motor: No abnormal muscle tone.     Coordination: Coordination normal.     Gait: Gait normal.  Psychiatric:        Mood and Affect: Mood normal.        Behavior: Behavior normal.         Assessment & Plan:   1. Uncontrolled type 2 diabetes mellitus with hyperglycemia (HCC) Due for for DM eye exam, labs, out of meds (GLP-1 due to cost) A1C always becomes uncontrolled when off GLP-1, will refill meds with new insurance and see if he is able to afford He is only on metformin and insulin - Ambulatory referral to Ophthalmology - COMPLETE METABOLIC PANEL WITH GFR - Hemoglobin A1c - Microalbumin / creatinine urine ratio - Lipid Panel w/reflex Direct LDL  2. Insulin dependent  type 2 diabetes mellitus (HCC) See #1 - Ambulatory referral to Ophthalmology - COMPLETE METABOLIC PANEL WITH GFR - Hemoglobin A1c - Microalbumin / creatinine urine ratio - Lipid Panel w/reflex Direct LDL  3. Primary hypertension Well controlled on candesartan 8 mg daily BP Readings from Last 3 Encounters:  06/16/23 130/72  10/09/22 112/66  07/17/22 118/66  - COMPLETE METABOLIC PANEL WITH GFR  4. Hyperlipidemia associated with type 2 diabetes mellitus (HCC) On crestor 40 mg Due for labs - COMPLETE METABOLIC PANEL WITH GFR - Lipid Panel w/reflex Direct LDL  5. Class 1 obesity with serious comorbidity and body mass index (BMI) of 32.0 to 32.9 in adult, unspecified obesity type He has lost weight - BMI now 28 Wt Readings from Last 5 Encounters:  06/16/23 207 lb 8 oz (94.1 kg)  10/09/22 215 lb 12.8 oz (97.9 kg)  07/17/22 214 lb 6.4 oz (97.3 kg)  07/08/22 217 lb 9.6 oz (98.7 kg)  04/07/22 230 lb 6.4 oz (104.5 kg)   BMI Readings from Last 5 Encounters:  06/16/23 28.94 kg/m  10/09/22 30.10 kg/m  07/17/22 29.90 kg/m  07/08/22 30.35 kg/m  04/07/22 32.13 kg/m    - COMPLETE METABOLIC PANEL WITH GFR  6. Peripheral vascular insufficiency (HCC) Due to f/up with vascular specialists on plavix and statin   7. Chronic intractable headache, unspecified headache type Per neurology  8. Anemia, unspecified type Recheck labs  9. Encounter for medication monitoring  - COMPLETE METABOLIC PANEL WITH GFR - Hemoglobin A1c - CBC with Differential/Platelet - Microalbumin / creatinine urine ratio - Lipid Panel w/reflex Direct LDL  Return for 4 week DM/insulin f/up.   Danelle Berry, PA-C 06/16/23 8:44 AM

## 2023-06-17 ENCOUNTER — Other Ambulatory Visit: Payer: Self-pay | Admitting: Family Medicine

## 2023-06-17 DIAGNOSIS — E119 Type 2 diabetes mellitus without complications: Secondary | ICD-10-CM

## 2023-06-17 DIAGNOSIS — E1151 Type 2 diabetes mellitus with diabetic peripheral angiopathy without gangrene: Secondary | ICD-10-CM

## 2023-06-17 DIAGNOSIS — E1165 Type 2 diabetes mellitus with hyperglycemia: Secondary | ICD-10-CM

## 2023-06-17 DIAGNOSIS — E1169 Type 2 diabetes mellitus with other specified complication: Secondary | ICD-10-CM

## 2023-06-17 LAB — CBC WITH DIFFERENTIAL/PLATELET
Absolute Monocytes: 791 cells/uL (ref 200–950)
Basophils Absolute: 69 cells/uL (ref 0–200)
Basophils Relative: 0.8 %
Eosinophils Absolute: 129 cells/uL (ref 15–500)
Eosinophils Relative: 1.5 %
HCT: 37.2 % — ABNORMAL LOW (ref 38.5–50.0)
Hemoglobin: 12.4 g/dL — ABNORMAL LOW (ref 13.2–17.1)
Lymphs Abs: 2649 cells/uL (ref 850–3900)
MCH: 31.3 pg (ref 27.0–33.0)
MCHC: 33.3 g/dL (ref 32.0–36.0)
MCV: 93.9 fL (ref 80.0–100.0)
MPV: 11.6 fL (ref 7.5–12.5)
Monocytes Relative: 9.2 %
Neutro Abs: 4962 cells/uL (ref 1500–7800)
Neutrophils Relative %: 57.7 %
Platelets: 291 10*3/uL (ref 140–400)
RBC: 3.96 10*6/uL — ABNORMAL LOW (ref 4.20–5.80)
RDW: 12.7 % (ref 11.0–15.0)
Total Lymphocyte: 30.8 %
WBC: 8.6 10*3/uL (ref 3.8–10.8)

## 2023-06-17 LAB — COMPLETE METABOLIC PANEL WITH GFR
AG Ratio: 1.3 (calc) (ref 1.0–2.5)
ALT: 13 U/L (ref 9–46)
AST: 11 U/L (ref 10–35)
Albumin: 4.2 g/dL (ref 3.6–5.1)
Alkaline phosphatase (APISO): 73 U/L (ref 35–144)
BUN: 10 mg/dL (ref 7–25)
CO2: 25 mmol/L (ref 20–32)
Calcium: 9.7 mg/dL (ref 8.6–10.3)
Chloride: 107 mmol/L (ref 98–110)
Creat: 0.99 mg/dL (ref 0.70–1.30)
Globulin: 3.3 g/dL (calc) (ref 1.9–3.7)
Glucose, Bld: 227 mg/dL — ABNORMAL HIGH (ref 65–99)
Potassium: 4.4 mmol/L (ref 3.5–5.3)
Sodium: 139 mmol/L (ref 135–146)
Total Bilirubin: 0.2 mg/dL (ref 0.2–1.2)
Total Protein: 7.5 g/dL (ref 6.1–8.1)
eGFR: 88 mL/min/{1.73_m2} (ref >=60)

## 2023-06-17 LAB — HEMOGLOBIN A1C
Hgb A1c MFr Bld: 12.4 % of total Hgb — ABNORMAL HIGH (ref <5.7)
Mean Plasma Glucose: 309 mg/dL
eAG (mmol/L): 17.1 mmol/L

## 2023-06-17 LAB — LIPID PANEL W/REFLEX DIRECT LDL
Cholesterol: 98 mg/dL (ref <200)
HDL: 34 mg/dL — ABNORMAL LOW (ref >=40)
LDL Cholesterol (Calc): 45 mg/dL (calc)
Non-HDL Cholesterol (Calc): 64 mg/dL (calc) (ref <130)
Total CHOL/HDL Ratio: 2.9 (calc) (ref <5.0)
Triglycerides: 107 mg/dL (ref <150)

## 2023-06-17 LAB — MICROALBUMIN / CREATININE URINE RATIO
Creatinine, Urine: 235 mg/dL (ref 20–320)
Microalb Creat Ratio: 16 mg/g creat (ref <30)
Microalb, Ur: 3.7 mg/dL

## 2023-06-17 MED ORDER — CANDESARTAN CILEXETIL 8 MG PO TABS: 8.0000 mg | ORAL_TABLET | Freq: Every day | ORAL | 1 refills | Status: DC

## 2023-06-17 MED ORDER — OZEMPIC (0.25 OR 0.5 MG/DOSE) 2 MG/3ML ~~LOC~~ SOPN
PEN_INJECTOR | SUBCUTANEOUS | 0 refills | Status: AC
Start: 2023-06-17 — End: 2023-09-09

## 2023-06-17 MED ORDER — BD PEN NEEDLE MICRO U/F 32G X 6 MM MISC
1 refills | Status: DC
Start: 2023-06-17 — End: 2024-06-28

## 2023-06-17 MED ORDER — LEVEMIR FLEXPEN 100 UNIT/ML ~~LOC~~ SOPN
20.0000 [IU] | PEN_INJECTOR | Freq: Every day | SUBCUTANEOUS | 1 refills | Status: DC
Start: 2023-06-17 — End: 2023-06-17

## 2023-06-17 MED ORDER — ROSUVASTATIN CALCIUM 40 MG PO TABS
40.0000 mg | ORAL_TABLET | Freq: Every day | ORAL | 1 refills | Status: DC
Start: 2023-06-17 — End: 2024-04-06

## 2023-06-17 MED ORDER — ROSUVASTATIN CALCIUM 40 MG PO TABS
40.0000 mg | ORAL_TABLET | Freq: Every day | ORAL | 1 refills | Status: DC
Start: 2023-06-17 — End: 2023-06-17

## 2023-06-17 MED ORDER — BD PEN NEEDLE MICRO U/F 32G X 6 MM MISC
5 refills | Status: DC
Start: 2023-06-17 — End: 2023-06-17

## 2023-06-17 MED ORDER — LEVEMIR FLEXPEN 100 UNIT/ML ~~LOC~~ SOPN
20.0000 [IU] | PEN_INJECTOR | Freq: Every day | SUBCUTANEOUS | 1 refills | Status: DC
Start: 2023-06-17 — End: 2023-06-18

## 2023-06-17 MED ORDER — CLOPIDOGREL BISULFATE 75 MG PO TABS: 75.0000 mg | ORAL_TABLET | Freq: Every day | ORAL | 1 refills | Status: DC

## 2023-06-17 MED ORDER — METFORMIN HCL 1000 MG PO TABS
1000.0000 mg | ORAL_TABLET | Freq: Two times a day (BID) | ORAL | 1 refills | Status: DC
Start: 2023-06-17 — End: 2024-04-06

## 2023-06-17 MED ORDER — METFORMIN HCL 1000 MG PO TABS
1000.0000 mg | ORAL_TABLET | Freq: Two times a day (BID) | ORAL | 1 refills | Status: DC
Start: 2023-06-17 — End: 2023-06-17

## 2023-06-17 NOTE — Progress Notes (Signed)
All meds refilled for 6 months sent to mail order and to [facility] - pt does need to investigate pharmacy benefits with his insurance and find out the preferred pharmacy, amount etc We are also waiting to see the price on GLP-1 or get PA All [facility]s sent to [facility] and [facility]  Danelle Berry, PA-C 06/17/23 3:07 PM

## 2023-06-18 MED ORDER — LANTUS SOLOSTAR 100 UNIT/ML ~~LOC~~ SOPN: 20.0000 [IU] | PEN_INJECTOR | Freq: Every day | SUBCUTANEOUS | 1 refills | Status: DC

## 2023-07-08 ENCOUNTER — Encounter: Payer: Self-pay | Admitting: Family Medicine

## 2023-07-14 ENCOUNTER — Ambulatory Visit: Payer: Medicare Other | Admitting: Family Medicine

## 2023-07-14 ENCOUNTER — Encounter: Payer: Self-pay | Admitting: Family Medicine

## 2023-07-14 VITALS — BP 132/76 | HR 99 | Temp 98.6°F | Resp 16 | Ht 71.0 in | Wt 206.9 lb

## 2023-07-14 DIAGNOSIS — E119 Type 2 diabetes mellitus without complications: Secondary | ICD-10-CM | POA: Diagnosis not present

## 2023-07-14 DIAGNOSIS — E1165 Type 2 diabetes mellitus with hyperglycemia: Secondary | ICD-10-CM | POA: Diagnosis not present

## 2023-07-14 DIAGNOSIS — E669 Obesity, unspecified: Secondary | ICD-10-CM

## 2023-07-14 DIAGNOSIS — Z794 Long term (current) use of insulin: Secondary | ICD-10-CM | POA: Diagnosis not present

## 2023-07-14 MED ORDER — SEMAGLUTIDE (1 MG/DOSE) 4 MG/3ML ~~LOC~~ SOPN: 1.0000 mg | PEN_INJECTOR | SUBCUTANEOUS | 1 refills | Status: DC

## 2023-07-14 MED ORDER — LANTUS SOLOSTAR 100 UNIT/ML ~~LOC~~ SOPN
20.0000 [IU] | PEN_INJECTOR | Freq: Every day | SUBCUTANEOUS | 1 refills | Status: DC
Start: 2023-07-14 — End: 2023-10-05

## 2023-07-14 NOTE — Progress Notes (Signed)
Name: Donald [name]   MRN: [id number]    DOB: [birth year]   Date:07/14/2023       Progress Note  Chief Complaint  Patient presents with   Follow-up   Diabetes    Pt did not pick up lantus due to cost     Subjective:   Donald [name] is a 57 y.o. male, presents to clinic for f/up on uncontrolled DM  Pt was able to get ozempic from pharmacy last month =- started 0.25 mg dose weekly, doing on Sundays, he has decreased appetite but no GI upset, abd pain, N, V Did not get insulin from the pharmacy - the cost was too high  He reports sugars decreasing a little since starting ozempic but still running >200 on average  He took some of his left over levemir 20 units   Current Outpatient Medications:    aspirin EC 81 MG EC tablet, Take 1 tablet (81 mg total) by mouth daily., Disp: , Rfl:    Blood Glucose Monitoring Suppl (D-CARE GLUCOMETER) w/Device KIT, 1 Units by Does not apply route 4 (four) times daily -  before meals and at bedtime., Disp: 1 kit, Rfl: 0   candesartan (ATACAND) 8 MG tablet, Take 1 tablet (8 mg total) by mouth daily., Disp: 100 tablet, Rfl: 1   clopidogrel (PLAVIX) 75 MG tablet, Take 1 tablet (75 mg total) by mouth daily., Disp: 100 tablet, Rfl: 1   Insulin Pen Needle (BD PEN NEEDLE MICRO U/F) 32G X 6 MM MISC, USE FOR LEVEMIR INJECTION ONCE TO TWICE DAILY, Disp: 200 each, Rfl: 1   metFORMIN (GLUCOPHAGE) 1000 MG tablet, Take 1 tablet (1,000 mg total) by mouth 2 (two) times daily with a meal., Disp: 200 tablet, Rfl: 1   rosuvastatin (CRESTOR) 40 MG tablet, Take 1 tablet (40 mg total) by mouth daily., Disp: 100 tablet, Rfl: 1   Semaglutide,0.25 or 0.5MG /DOS, (OZEMPIC, 0.25 OR 0.5 MG/DOSE,) 2 MG/3ML SOPN, Inject 0.25 mg into the skin once a week for 28 days, THEN 0.5 mg once a week., Disp: 6 mL, Rfl: 0   topiramate (TOPAMAX) 100 MG tablet, Take 100 mg by mouth 2 (two) times daily., Disp: , Rfl:    insulin glargine (LANTUS SOLOSTAR) 100 UNIT/ML Solostar Pen, Inject 20-50 Units  into the skin daily. (Patient not taking: Reported on 07/14/2023), Disp: 30 mL, Rfl: 1   NURTEC 75 MG TBDP, Take 1 tablet by mouth as directed. (Patient not taking: Reported on 06/16/2023), Disp: , Rfl:   Patient Active Problem List   Diagnosis Date Noted   Intractable chronic migraine without aura and without status migrainosus 08/20/2022   Uncontrolled type 2 diabetes mellitus with hyperglycemia (HCC) 07/17/2022   Insulin dependent type 2 diabetes mellitus (HCC) 07/17/2022   Anemia 04/07/2022   Carotid stenosis 01/17/2022   Syncope with normal neurologic examination 03/28/2021   Chronic intractable headache 02/10/2020   Diabetes mellitus type 2 in obese 11/12/2018   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   Class 1 obesity with serious comorbidity and body mass index (BMI) of 32.0 to 32.9 in adult 05/27/2018   Edema of esophagus    Angiodysplasia of stomach and duodenum    Stomach irritation    Heartburn    Current smoker 05/11/2018   Hypertension 09/15/2017   Hx of transient ischemic attack (TIA) 08/05/2017   Peripheral vascular insufficiency (HCC) 07/06/2017   Hyperlipidemia associated with type 2 diabetes mellitus (HCC) 10/31/2016    Past Surgical History:  Procedure Laterality Date  ESOPHAGOGASTRODUODENOSCOPY (EGD) WITH PROPOFOL N/A 05/26/2018   Procedure: ESOPHAGOGASTRODUODENOSCOPY (EGD) WITH PROPOFOL;  Surgeon: Pasty Spillers, MD;  Location: [facility] ENDOSCOPY;  Service: Endoscopy;  Laterality: N/A;   KNEE SURGERY Right     Family History  Problem Relation Age of Onset   Hypertension Father    Diabetes Father    Liver disease Father    Diverticulitis Mother    Migraines Mother    Cancer Sister        tumors in the breast    Diabetes Maternal Grandfather    Cancer Sister        tumors in the breast    Cancer Sister        tumor in the breast     Social History   Tobacco Use   Smoking status: Every Day    Current packs/day: 0.50    Average packs/day: 0.5  packs/day for 30.0 years (15.0 ttl pk-yrs)    Types: Cigarettes   Smokeless tobacco: Never  Vaping Use   Vaping status: Never Used  Substance Use Topics   Alcohol use: Yes    Alcohol/week: 0.0 standard drinks of alcohol    Comment: none in the last 3 weeks   Drug use: No     Allergies  Allergen Reactions   No Known Allergies     Health Maintenance  Topic Date Due   Medicare Annual Wellness (AWV)  Never done   DTaP/Tdap/Td (1 - Tdap) Never done   OPHTHALMOLOGY EXAM  04/24/2023   INFLUENZA VACCINE  07/02/2023   COVID-19 Vaccine (3 - 2023-24 season) 07/30/2023 (Originally 08/01/2022)   Zoster Vaccines- Shingrix (1 of 2) 09/16/2023 (Originally 06/13/2014)   HEMOGLOBIN A1C  12/17/2023   FOOT EXAM  03/02/2024   Diabetic kidney evaluation - eGFR measurement  06/15/2024   Diabetic kidney evaluation - Urine ACR  06/15/2024   Colonoscopy  12/01/2024   Hepatitis C Screening  Completed   HIV Screening  Completed   HPV VACCINES  Aged Out    Chart Review Today: I personally reviewed active problem list, medication list, allergies, family history, social history, health maintenance, notes from last encounter, lab results, imaging with the patient/caregiver today.   Review of Systems  Constitutional: Negative.   HENT: Negative.    Eyes: Negative.   Respiratory: Negative.    Cardiovascular: Negative.   Gastrointestinal: Negative.   Endocrine: Negative.   Genitourinary: Negative.   Musculoskeletal: Negative.   Skin: Negative.   Allergic/Immunologic: Negative.   Neurological: Negative.   Hematological: Negative.   Psychiatric/Behavioral: Negative.    All other systems reviewed and are negative.    Objective:   Vitals:   07/14/23 0927  BP: 132/76  Pulse: 99  Resp: 16  Temp: 98.6 F (37 C)  TempSrc: Oral  SpO2: 95%  Weight: 206 lb 14.4 oz (93.8 kg)  Height: 5\' 11"  (1.803 m)    Body mass index is 28.86 kg/m.  Physical Exam Vitals and nursing note reviewed.   Constitutional:      Appearance: He is well-developed.  HENT:     Head: Normocephalic and atraumatic.     Nose: Nose normal.  Eyes:     General:        Right eye: No discharge.        Left eye: No discharge.     Conjunctiva/sclera: Conjunctivae normal.  Neck:     Trachea: No tracheal deviation.  Cardiovascular:     Rate and Rhythm: Normal rate and regular rhythm.  Pulmonary:     Effort: Pulmonary effort is normal. No respiratory distress.     Breath sounds: No stridor.  Musculoskeletal:        General: Normal range of motion.  Skin:    General: Skin is warm and dry.     Findings: No rash.  Neurological:     Mental Status: He is alert.     Motor: No abnormal muscle tone.     Coordination: Coordination normal.  Psychiatric:        Behavior: Behavior normal.         Assessment & Plan:   1. Uncontrolled type 2 diabetes mellitus with hyperglycemia (HCC) He got and started ozempic - tolerating so far, did not get insulin Advised to increase ozempic dose to 0.50 mg weekly shot until his 2 pens are out and then after that we will try to increase dose to 1mg   He will need to increase lantus dose if fasting CBGs are still >200 After increasing ozempic dose he may need to do further insulin dose adjustments Overall continue other DM meds and keep f/up appt in Oct All instructions, doses and dates for meds written out for him and med prescribed and info given, printed and annotated for him on AVS today since he is having a hard time understanding what meds and what doses to get from which pharmacy - insulin glargine (LANTUS SOLOSTAR) 100 UNIT/ML Solostar Pen; Inject 20-50 Units into the skin daily.  Dispense: 30 mL; Refill: 1  2. Insulin dependent type 2 diabetes mellitus (HCC) See above - insulin glargine (LANTUS SOLOSTAR) 100 UNIT/ML Solostar Pen; Inject 20-50 Units into the skin daily.  Dispense: 30 mL; Refill: 1      Danelle Berry, PA-C 07/14/23 9:44 AM

## 2023-07-14 NOTE — Patient Instructions (Addendum)
Sunday 8/18 do the 0.50 mg ozempic dose weekly for 6 doses  After that you will get the 1.0 mg dose and do that weekly If you have upsetting stomach symptoms or cannot tolerate a dose increase then I need you to let us know right away so we can change the next refill.   Semaglutide, 1 MG/DOSE, 4 MG/3ML SOPN [[id number]]  Dose: 1 mg Route: Injection Frequency: Weekly  Dispense Quantity: 9 mL Refills: 1        Sig: Inject 1 mg as directed once a week.       Start Date: 08/30/23 End Date: --  Written Date: 07/14/23 Expiration Date: 07/13/24     Disp Refills Start End   Semaglutide, 1 MG/DOSE, 4 MG/3ML SOPN 9 mL 1 08/30/2023 --   Sig - Route: Inject 1 mg as directed once a week. - Injection   Sent to pharmacy as: Semaglutide, 1 MG/DOSE, 4 MG/3ML Solution Pen-injector   E-Prescribing Status: Transmission to pharmacy in progress (07/14/2023  9:59 AM EDT)     [facility] - [place], [place] - [address] [place] [address] Phone: [phone]  Fax: [phone]

## 2023-08-23 ENCOUNTER — Other Ambulatory Visit: Payer: Self-pay | Admitting: Family Medicine

## 2023-08-23 DIAGNOSIS — E1165 Type 2 diabetes mellitus with hyperglycemia: Secondary | ICD-10-CM

## 2023-08-23 DIAGNOSIS — E119 Type 2 diabetes mellitus without complications: Secondary | ICD-10-CM

## 2023-09-21 ENCOUNTER — Other Ambulatory Visit: Payer: Self-pay | Admitting: Pharmacist

## 2023-09-21 NOTE — Progress Notes (Unsigned)
09/23/2023 Name: Donald [name] MRN: [id number] DOB: September 20, 1966  Chief Complaint  Patient presents with   Medication Management   Medication Assistance    Donald [name] is a 57 y.o. year old male who was referred to the pharmacist for assistance in managing medication access.    Subjective:  Care Team: Primary Care Provider: Danelle Berry, PA-C Neurologist: Jolene Provost, MD  Medication Access/Adherence  Current Pharmacy:  [facility] [phone] [place], [place] - [address] [place] [address] Phone: [phone] Fax: [phone]  [facility] - [place], [place] - [address] [place] [address] Phone: [phone] Fax: [phone]   Patient reports affordability concerns with their medications: No  Patient reports access/transportation concerns to their pharmacy: No  Patient reports adherence concerns with their medications:  No    Reports his medications are now all affordable since switched to a new Medicare plan (do not yet see scan of this card in chart)  Diabetes:  Current medications:  - Metformin 1000 mg twice daily - Ozempic 1 mg weekly (increased to current dose ~1 month ago) Tolerating well - Lantus 20 units daily  Current glucose readings: morning fasting ranging 110-120  Patient denies hypoglycemic s/sx including dizziness, shakiness, sweating. Patient   Statin therapy: rosuvastatin 40 mg daily  Current medication access support: none   Objective:  Lab Results  Component Value Date   HGBA1C 12.4 (H) 06/16/2023    Lab Results  Component Value Date   CREATININE 0.99 06/16/2023   BUN 10 06/16/2023   NA 139 06/16/2023   K 4.4 06/16/2023   CL 107 06/16/2023   CO2 25 06/16/2023    Lab Results  Component Value Date   CHOL 98 06/16/2023   HDL 34 (L) 06/16/2023   LDLCALC 45 06/16/2023   TRIG 107 06/16/2023   CHOLHDL 2.9 06/16/2023    Medications  Reviewed Today     Reviewed by Manuela Neptune, RPH-CPP (Pharmacist) on 09/23/23 at 0806  Med List Status: <None>   Medication Order Taking? Sig Documenting Provider Last Dose Status Informant  aspirin EC 81 MG EC tablet [id number]  Take 1 tablet (81 mg total) by mouth daily. Milagros Loll, MD  Active Self  Blood Glucose Monitoring Suppl (D-CARE GLUCOMETER) w/Device KIT [id number]  1 Units by Does not apply route 4 (four) times daily -  before meals and at bedtime. Milagros Loll, MD  Active Self  candesartan (ATACAND) 8 MG tablet [id number]  Take 1 tablet (8 mg total) by mouth daily. Danelle Berry, PA-C  Active   clopidogrel (PLAVIX) 75 MG tablet [id number]  Take 1 tablet (75 mg total) by mouth daily. Danelle Berry, PA-C  Active   insulin glargine (LANTUS SOLOSTAR) 100 UNIT/ML Solostar Pen [id number] Yes Inject 20-50 Units into the skin daily.  Patient taking differently: Inject 20 Units into the skin daily.   Danelle Berry, PA-C Taking Active   Insulin Pen Needle (BD PEN NEEDLE MICRO U/F) 32G X 6 MM MISC [id number]  USE FOR LEVEMIR INJECTION ONCE TO TWICE DAILY Danelle Berry, PA-C  Active   metFORMIN (GLUCOPHAGE) 1000 MG tablet [id number] Yes Take 1 tablet (1,000 mg total) by mouth 2 (two) times daily with a meal. Danelle Berry, PA-C Taking Active   NURTEC 75 MG TBDP [id number]  Take 1 tablet by mouth as directed.  Patient not taking: Reported on 06/16/2023   [provider]  Active  rosuvastatin (CRESTOR) 40 MG tablet [id number]  Take 1 tablet (40 mg total) by mouth daily. Danelle Berry, PA-C  Active   Semaglutide, 1 MG/DOSE, 4 MG/3ML SOPN [id number] Yes Inject 1 mg as directed once a week. Danelle Berry, PA-C Taking Active   topiramate (TOPAMAX) 100 MG tablet [id number]  Take 100 mg by mouth 2 (two) times daily. [provider]  Active               Assessment/Plan:   Encourage patient to contact office to schedule next follow up appointment with PCP  Diabetes: - Currently  control improved based on reported home blood sugar readings - Reviewed long term cardiovascular and renal outcomes of uncontrolled blood sugar - Reviewed goal A1c, goal fasting, and goal 2 hour post prandial glucose - Discuss importance of having regular well-balanced meals and snacks throughout the day, while controlling carbohydrate portion sizes - Recommend to check glucose, keep log of results and have this record to review at upcoming medical appointments. Patient to contact provider office sooner if needed for readings outside of established parameters or symptoms    Follow Up Plan:   Patient denies further medication questions or concerns today Provide patient with contact information for clinic pharmacist to contact if needed in future for medication questions/concerns   Estelle Grumbles, PharmD, [facility] [phone]

## 2023-09-23 NOTE — Patient Instructions (Signed)
Goals Addressed             This Visit's Progress    Pharmacy Goals       The goal A1c is less than 7%. This is the best way to reduce the risk of the long term complications of diabetes, including heart disease, kidney disease, eye disease, strokes, and nerve damage. An A1c of less than 7% corresponds with fasting sugars less than 130 and 2 hour after meal sugars less than 180. Please check your blood sugar daily.  Please feel free to give me a call if you have any medication related questions or concerns in the future.  Estelle Grumbles, PharmD, [facility] [phone]

## 2023-10-05 ENCOUNTER — Other Ambulatory Visit: Payer: Self-pay | Admitting: Family Medicine

## 2023-10-05 DIAGNOSIS — Z794 Long term (current) use of insulin: Secondary | ICD-10-CM

## 2023-10-05 DIAGNOSIS — E1165 Type 2 diabetes mellitus with hyperglycemia: Secondary | ICD-10-CM

## 2023-11-02 ENCOUNTER — Ambulatory Visit: Payer: Medicare Other | Admitting: Physician Assistant

## 2023-11-03 ENCOUNTER — Encounter: Payer: Self-pay | Admitting: Physician Assistant

## 2023-11-03 ENCOUNTER — Ambulatory Visit (INDEPENDENT_AMBULATORY_CARE_PROVIDER_SITE_OTHER): Payer: Medicare Other | Admitting: Physician Assistant

## 2023-11-03 VITALS — BP 126/68 | HR 91 | Resp 16 | Ht 71.0 in | Wt 208.0 lb

## 2023-11-03 DIAGNOSIS — I10 Essential (primary) hypertension: Secondary | ICD-10-CM | POA: Diagnosis not present

## 2023-11-03 DIAGNOSIS — E1169 Type 2 diabetes mellitus with other specified complication: Secondary | ICD-10-CM

## 2023-11-03 DIAGNOSIS — E785 Hyperlipidemia, unspecified: Secondary | ICD-10-CM

## 2023-11-03 DIAGNOSIS — F17200 Nicotine dependence, unspecified, uncomplicated: Secondary | ICD-10-CM | POA: Diagnosis not present

## 2023-11-03 DIAGNOSIS — E1165 Type 2 diabetes mellitus with hyperglycemia: Secondary | ICD-10-CM | POA: Diagnosis not present

## 2023-11-03 DIAGNOSIS — Z794 Long term (current) use of insulin: Secondary | ICD-10-CM | POA: Diagnosis not present

## 2023-11-03 NOTE — Progress Notes (Unsigned)
Established Patient Office Visit  Name: Donald ULRICH Sr.   MRN: [id number]    DOB: 23-May-1966   Date:11/04/2023  Today's Provider: Jacquelin Hawking, MHS, PA-C Introduced myself to the patient as a PA-C and provided education on APPs in clinical practice.         Subjective  Chief Complaint  Chief Complaint  Patient presents with   Follow-up    3 month   Diabetes    HPI   Diabetes, Type 2 - Last A1c 12.4% - Medications: Lantus 20 units at bedtime, Ozempic 1 mg weekly injection, Metformin 1000 mg PO BID - Compliance: good  - Checking BG at home: yes, reports fasting has been 120s-130s  - Diet: He has tried to reduce sugar in diet  - Exercise:  he is walking every day for about 30 minutes  - Eye exam: ordered in July - he has not been to the ophthalmologist  - Foot exam: UTD - Microalbumin: UTD - Statin: on statin therapy  - PNA vaccine: NA  - Denies symptoms of hypoglycemia, polyuria, polydipsia, numbness extremities, foot ulcers/trauma  HYPERTENSION / HYPERLIPIDEMIA Satisfied with current treatment? 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   He is interested in quitting smoking but is worried about making too many changes    Patient Active Problem List   Diagnosis Date Noted   Intractable chronic migraine without aura and without status migrainosus 08/20/2022   Uncontrolled type 2 diabetes mellitus with  hyperglycemia (HCC) 07/17/2022   Insulin dependent type 2 diabetes mellitus (HCC) 07/17/2022   Anemia 04/07/2022   Carotid stenosis 01/17/2022   Syncope with normal neurologic examination 03/28/2021   Chronic intractable headache 02/10/2020   Type 2 diabetes mellitus with obesity (HCC) 11/12/2018   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   Class 1 obesity with serious comorbidity and body mass index (BMI) of 32.0 to 32.9 in adult 05/27/2018   Edema of esophagus    Angiodysplasia of stomach and duodenum    Stomach irritation    Heartburn    Current smoker 05/11/2018   Hypertension 09/15/2017   Hx of transient ischemic attack (TIA) 08/05/2017   Peripheral vascular insufficiency (HCC) 07/06/2017   Hyperlipidemia associated with type 2 diabetes mellitus (HCC) 10/31/2016    Past Surgical History:  Procedure Laterality Date   ESOPHAGOGASTRODUODENOSCOPY (EGD) WITH PROPOFOL N/A 05/26/2018   Procedure: ESOPHAGOGASTRODUODENOSCOPY (EGD) WITH PROPOFOL;  Surgeon: Pasty Spillers, MD;  Location: [facility];  Service: Endoscopy;  Laterality: N/A;   KNEE SURGERY Right     Family History  Problem Relation Age of Onset   Hypertension Father    Diabetes Father    Liver disease Father    Diverticulitis Mother    Migraines Mother    Cancer Sister        tumors in the breast    Diabetes Maternal Grandfather    Cancer Sister        tumors in the breast    Cancer Sister  tumor in the breast     Social History   Tobacco Use   Smoking status: Every Day    Current packs/day: 0.50    Average packs/day: 0.5 packs/day for 30.0 years (15.0 ttl pk-yrs)    Types: Cigarettes   Smokeless tobacco: Never  Substance Use Topics   Alcohol use: Yes    Alcohol/week: 0.0 standard drinks of alcohol    Comment: none in the last 3 weeks     Current Outpatient Medications:    aspirin EC 81 MG EC tablet, Take 1 tablet (81 mg total) by mouth daily., Disp: , Rfl:    Blood Glucose Monitoring Suppl  (D-CARE GLUCOMETER) w/Device KIT, 1 Units by Does not apply route 4 (four) times daily -  before meals and at bedtime., Disp: 1 kit, Rfl: 0   candesartan (ATACAND) 8 MG tablet, Take 1 tablet (8 mg total) by mouth daily., Disp: 100 tablet, Rfl: 1   clopidogrel (PLAVIX) 75 MG tablet, Take 1 tablet (75 mg total) by mouth daily., Disp: 100 tablet, Rfl: 1   insulin glargine (LANTUS SOLOSTAR) 100 UNIT/ML Solostar Pen, INJECT SUBCUTANEOUSLY 20 TO 50  UNITS DAILY, Disp: 60 mL, Rfl: 0   Insulin Pen Needle (BD PEN NEEDLE MICRO U/F) 32G X 6 MM MISC, USE FOR LEVEMIR INJECTION ONCE TO TWICE DAILY, Disp: 200 each, Rfl: 1   metFORMIN (GLUCOPHAGE) 1000 MG tablet, Take 1 tablet (1,000 mg total) by mouth 2 (two) times daily with a meal., Disp: 200 tablet, Rfl: 1   rosuvastatin (CRESTOR) 40 MG tablet, Take 1 tablet (40 mg total) by mouth daily., Disp: 100 tablet, Rfl: 1   Semaglutide, 1 MG/DOSE, 4 MG/3ML SOPN, Inject 1 mg as directed once a week., Disp: 9 mL, Rfl: 1   topiramate (TOPAMAX) 100 MG tablet, Take 100 mg by mouth 2 (two) times daily., Disp: , Rfl:    NURTEC 75 MG TBDP, Take 1 tablet by mouth as directed. (Patient not taking: Reported on 06/16/2023), Disp: , Rfl:   Allergies  Allergen Reactions   No Known Allergies     I personally reviewed active problem list, medication list, allergies, health maintenance, notes from last encounter, lab results with the patient/caregiver today.   Review of Systems  Skin:  Positive for itching and rash (along forearms).      Objective  Vitals:   11/03/23 1115  BP: 126/68  Pulse: 91  Resp: 16  SpO2: 100%  Weight: 208 lb (94.3 kg)  Height: 5\' 11"  (1.803 m)    Body mass index is 29.01 kg/m.  Physical Exam Vitals reviewed.  Constitutional:      General: He is awake.     Appearance: Normal appearance. He is well-developed and well-groomed.  HENT:     Head: Normocephalic and atraumatic.  Cardiovascular:     Rate and Rhythm: Normal rate and regular  rhythm.     Pulses: Normal pulses.          Radial pulses are 2+ on the right side and 2+ on the left side.     Heart sounds: Normal heart sounds. No murmur heard.    No friction rub. No gallop.  Pulmonary:     Effort: Pulmonary effort is normal.     Breath sounds: Normal breath sounds. No decreased air movement. No decreased breath sounds, wheezing, rhonchi or rales.  Musculoskeletal:     Cervical back: Normal range of motion.     Right lower leg: No edema.  Left lower leg: No edema.  Skin:    Comments: Several eczema lesions along both forearms - scaling border with mild redness throughout    Neurological:     General: No focal deficit present.     Mental Status: He is alert and oriented to person, place, and time. Mental status is at baseline.     GCS: GCS eye subscore is 4. GCS verbal subscore is 5. GCS motor subscore is 6.  Psychiatric:        Attention and Perception: Attention and perception normal.        Mood and Affect: Mood and affect normal.        Speech: Speech normal.        Behavior: Behavior normal. Behavior is cooperative.        Thought Content: Thought content normal.        Cognition and Memory: Cognition normal.      Recent Results (from the past 2160 hour(s))  HgB A1c     Status: Abnormal   Collection Time: 11/03/23 11:33 AM  Result Value Ref Range   Hgb A1c MFr Bld 7.2 (H) <5.7 % of total Hgb    Comment: For someone without known diabetes, a hemoglobin A1c value of 6.5% or greater indicates that they may have  diabetes and this should be confirmed with a follow-up  test. . For someone with known diabetes, a value <7% indicates  that their diabetes is well controlled and a value  greater than or equal to 7% indicates suboptimal  control. A1c targets should be individualized based on  duration of diabetes, age, comorbid conditions, and  other considerations. . Currently, no consensus exists regarding use of hemoglobin A1c for diagnosis of  diabetes for children. .    Mean Plasma Glucose 160 mg/dL   eAG (mmol/L) 8.9 mmol/L  Lipid Profile     Status: None   Collection Time: 11/03/23 11:33 AM  Result Value Ref Range   Cholesterol 95 <200 mg/dL   HDL 40 > OR = 40 mg/dL   Triglycerides 95 <409 mg/dL   LDL Cholesterol (Calc) 37 mg/dL (calc)    Comment: Reference range: <100 . Desirable range <100 mg/dL for primary prevention;   <70 mg/dL for patients with CHD or diabetic patients  with > or = 2 CHD risk factors. Marland Kitchen LDL-C is now calculated using the [name]-Hopkins  calculation, which is a validated novel method providing  better accuracy than the Friedewald equation in the  estimation of LDL-C.  Horald Pollen et al. Lenox Ahr. 8119;147(82): 2061-2068  (http://education.[employer].com/faq/FAQ164)    Total CHOL/HDL Ratio 2.4 <5.0 (calc)   Non-HDL Cholesterol (Calc) 55 <956 mg/dL (calc)    Comment: For patients with diabetes plus 1 major ASCVD risk  factor, treating to a non-HDL-C goal of <100 mg/dL  (LDL-C of <21 mg/dL) is considered a therapeutic  option.   COMPLETE METABOLIC PANEL WITH GFR     Status: Abnormal   Collection Time: 11/03/23 11:33 AM  Result Value Ref Range   Glucose, Bld 155 (H) 65 - 99 mg/dL    Comment: .            Fasting reference interval . For someone without known diabetes, a glucose value >125 mg/dL indicates that they may have diabetes and this should be confirmed with a follow-up test. .    BUN 13 7 - 25 mg/dL   Creat 3.08 6.57 - 8.46 mg/dL   eGFR 84 > OR = 60 NG/EXB/2.84X3  BUN/Creatinine Ratio SEE NOTE: 6 - 22 (calc)    Comment:    Not Reported: BUN and Creatinine are within    reference range. .    Sodium 141 135 - 146 mmol/L   Potassium 4.6 3.5 - 5.3 mmol/L   Chloride 107 98 - 110 mmol/L   CO2 26 20 - 32 mmol/L   Calcium 9.9 8.6 - 10.3 mg/dL   Total Protein 7.6 6.1 - 8.1 g/dL   Albumin 4.1 3.6 - 5.1 g/dL   Globulin 3.5 1.9 - 3.7 g/dL (calc)   AG Ratio 1.2 1.0 - 2.5 (calc)    Total Bilirubin 0.2 0.2 - 1.2 mg/dL   Alkaline phosphatase (APISO) 78 35 - 144 U/L   AST 12 10 - 35 U/L   ALT 16 9 - 46 U/L  CBC w/Diff/Platelet     Status: Abnormal   Collection Time: 11/03/23 11:33 AM  Result Value Ref Range   WBC 9.3 3.8 - 10.8 Thousand/uL   RBC 3.66 (L) 4.20 - 5.80 Million/uL   Hemoglobin 10.9 (L) 13.2 - 17.1 g/dL   HCT 56.2 (L) 13.0 - 86.5 %   MCV 93.2 80.0 - 100.0 fL   MCH 29.8 27.0 - 33.0 pg   MCHC 32.0 32.0 - 36.0 g/dL    Comment: For adults, a slight decrease in the calculated MCHC value (in the range of 30 to 32 g/dL) is most likely not clinically significant; however, it should be interpreted with caution in correlation with other red cell parameters and the patient's clinical condition.    RDW 13.0 11.0 - 15.0 %   Platelets 383 140 - 400 Thousand/uL   MPV 11.1 7.5 - 12.5 fL   Neutro Abs 5,831 1,500 - 7,800 cells/uL   Absolute Lymphocytes 2,511 850 - 3,900 cells/uL   Absolute Monocytes 735 200 - 950 cells/uL   Eosinophils Absolute 121 15 - 500 cells/uL   Basophils Absolute 102 0 - 200 cells/uL   Neutrophils Relative % 62.7 %   Total Lymphocyte 27.0 %   Monocytes Relative 7.9 %   Eosinophils Relative 1.3 %   Basophils Relative 1.1 %     PHQ2/9:    11/03/2023   11:14 AM 07/14/2023    9:27 AM 06/16/2023    9:12 AM 10/09/2022    9:14 AM 07/17/2022    9:21 AM  Depression screen PHQ 2/9  Decreased Interest 0 0 0 0 0  Down, Depressed, Hopeless 0 0 0 0 0  PHQ - 2 Score 0 0 0 0 0  Altered sleeping  0 0 0 0  Tired, decreased energy  0 0 0 0  Change in appetite  0 0 0 0  Feeling bad or failure about yourself   0 0 0 0  Trouble concentrating  0 0 0 0  Moving slowly or fidgety/restless  0 0 0 0  Suicidal thoughts  0 0 0 0  PHQ-9 Score  0 0 0 0  Difficult doing work/chores  Not difficult at all Not difficult at all Not difficult at all Not difficult at all      Fall Risk:    11/03/2023   11:14 AM 07/14/2023    9:27 AM 06/16/2023    9:11 AM  10/09/2022    9:14 AM 07/17/2022    9:21 AM  Fall Risk   Falls in the past year? 0 0 0 0 0  Number falls in past yr: 0 0 0 0 0  Injury with  Fall? 0 0 0 0 0  Risk for fall due to : No Fall Risks No Fall Risks No Fall Risks No Fall Risks No Fall Risks  Follow up Falls prevention discussed Falls prevention discussed;Education provided;Falls evaluation completed Falls prevention discussed;Education provided;Falls evaluation completed Falls prevention discussed;Education provided;Falls evaluation completed Falls prevention discussed;Education provided      Functional Status Survey: Is the patient deaf or have difficulty hearing?: No Does the patient have difficulty seeing, even when wearing glasses/contacts?: No Does the patient have difficulty concentrating, remembering, or making decisions?: No Does the patient have difficulty walking or climbing stairs?: No Does the patient have difficulty dressing or bathing?: No Does the patient have difficulty doing errands alone such as visiting a doctor's office or shopping?: No    Assessment & Plan  Problem List Items Addressed This Visit       Cardiovascular and Mediastinum   Hypertension - Primary    Chronic, historic condition Appears well managed with current regimen comprised of Candesartan 8 mg PO every day Continue current regimen BP appears in goal today in office  Recommend further diet and lifestyle changes for continued CV benefit Follow up in 6 months or sooner if concerns arise        Relevant Orders   COMPLETE METABOLIC PANEL WITH GFR (Completed)   CBC w/Diff/Platelet (Completed)     Endocrine   Hyperlipidemia associated with type 2 diabetes mellitus (HCC)    Chronic, historic condition Appears managed with current regimen comprised of Rosuvastatin 40 mg PO every day Continue current regimen Recheck lipid panel Results to dictate further management  Follow up in 6 months or sooner if concerns arise        Relevant  Orders   Lipid Profile (Completed)   Uncontrolled type 2 diabetes mellitus with hyperglycemia (HCC)    Chronic, historic condition  Most recent A1c was 12.4 %  Currently taking Lantus 20 units at bedtime, Ozempic 1 mg weekly injection, Metformin 1000 mg PO BID Continue current regimen Recheck A1c today- Results to dictate further management  Continue checking glucose at home regularly  Reviewed importance of annual eye exam Follow up in 3 months or sooner if concerns arise        Relevant Orders   HgB A1c (Completed)     Other   Current smoker    Chronic, ongoing He is still smoking currently- about a pack per day He is interested in quitting but does not have a plan right now Reports he stopped drinking about 8 months ago and is worried about making too many changes at once and getting stressed Recommend he makes decreases slowly as tolerated so this does not cause increased stress and we can discuss further when he is more committed to cessation          No follow-ups on file.   I, Meline Russaw E Chelsey Redondo, PA-C, have reviewed all documentation for this visit. The documentation on 11/04/23 for the exam, diagnosis, procedures, and orders are all accurate and complete.   Jacquelin Hawking, MHS, PA-C [facility]

## 2023-11-03 NOTE — Assessment & Plan Note (Signed)
He is still smoking currently- about a pack per day He is interested in quitting but does not have a plan right

## 2023-11-04 LAB — CBC WITH DIFFERENTIAL/PLATELET
Absolute Lymphocytes: 2511 {cells}/uL (ref 850–3900)
Absolute Monocytes: 735 {cells}/uL (ref 200–950)
Basophils Absolute: 102 {cells}/uL (ref 0–200)
Basophils Relative: 1.1 %
Eosinophils Absolute: 121 {cells}/uL (ref 15–500)
Eosinophils Relative: 1.3 %
HCT: 34.1 % — ABNORMAL LOW (ref 38.5–50.0)
Hemoglobin: 10.9 g/dL — ABNORMAL LOW (ref 13.2–17.1)
MCH: 29.8 pg (ref 27.0–33.0)
MCHC: 32 g/dL (ref 32.0–36.0)
MCV: 93.2 fL (ref 80.0–100.0)
MPV: 11.1 fL (ref 7.5–12.5)
Monocytes Relative: 7.9 %
Neutro Abs: 5831 {cells}/uL (ref 1500–7800)
Neutrophils Relative %: 62.7 %
Platelets: 383 10*3/uL (ref 140–400)
RBC: 3.66 10*6/uL — ABNORMAL LOW (ref 4.20–5.80)
RDW: 13 % (ref 11.0–15.0)
Total Lymphocyte: 27 %
WBC: 9.3 10*3/uL (ref 3.8–10.8)

## 2023-11-04 LAB — COMPLETE METABOLIC PANEL WITH GFR
AG Ratio: 1.2 (calc) (ref 1.0–2.5)
ALT: 16 U/L (ref 9–46)
AST: 12 U/L (ref 10–35)
Albumin: 4.1 g/dL (ref 3.6–5.1)
Alkaline phosphatase (APISO): 78 U/L (ref 35–144)
BUN: 13 mg/dL (ref 7–25)
CO2: 26 mmol/L (ref 20–32)
Calcium: 9.9 mg/dL (ref 8.6–10.3)
Chloride: 107 mmol/L (ref 98–110)
Creat: 1.03 mg/dL (ref 0.70–1.30)
Globulin: 3.5 g/dL (ref 1.9–3.7)
Glucose, Bld: 155 mg/dL — ABNORMAL HIGH (ref 65–99)
Potassium: 4.6 mmol/L (ref 3.5–5.3)
Sodium: 141 mmol/L (ref 135–146)
Total Bilirubin: 0.2 mg/dL (ref 0.2–1.2)
Total Protein: 7.6 g/dL (ref 6.1–8.1)
eGFR: 84 mL/min/{1.73_m2} (ref >=60)

## 2023-11-04 LAB — LIPID PANEL
Cholesterol: 95 mg/dL (ref <200)
HDL: 40 mg/dL (ref >=40)
LDL Cholesterol (Calc): 37 mg/dL
Non-HDL Cholesterol (Calc): 55 mg/dL (ref <130)
Total CHOL/HDL Ratio: 2.4 (calc) (ref <5.0)
Triglycerides: 95 mg/dL (ref <150)

## 2023-11-04 LAB — HEMOGLOBIN A1C
Hgb A1c MFr Bld: 7.2 %{Hb} — ABNORMAL HIGH (ref <5.7)
Mean Plasma Glucose: 160 mg/dL
eAG (mmol/L): 8.9 mmol/L

## 2023-11-04 NOTE — Assessment & Plan Note (Signed)
Chronic, historic condition Appears managed with current regimen comprised of Rosuvastatin 40 mg PO every day Continue current regimen Recheck lipid panel Results to dictate further management  Follow up in 6 months or sooner if concerns arise

## 2023-11-04 NOTE — Assessment & Plan Note (Signed)
Chronic, historic condition  Most recent A1c was 12.4 %  Currently taking Lantus 20 units at bedtime, Ozempic 1 mg weekly injection, Metformin 1000 mg PO BID Continue current regimen Recheck A1c today- Results to dictate further management  Continue checking glucose at home regularly  Reviewed importance of annual eye exam Follow up in 3 months or sooner if concerns arise

## 2023-11-04 NOTE — Assessment & Plan Note (Signed)
Chronic, historic condition Appears well managed with current regimen comprised of Candesartan 8 mg PO every day Continue current regimen BP appears in goal today in office  Recommend further diet and lifestyle changes for continued CV benefit Follow up in 6 months or sooner if concerns arise

## 2023-11-05 NOTE — Progress Notes (Signed)
Your labs are back Your A1c has improved to 7.2%.  This is excellent work.  We generally prefer patients to be less than 7.0 so you are very close to being in goal.  Please make sure that you are following your diabetes regimen as directed and reducing excess sugar in your diet. Your electrolytes, liver and kidney function are overall in normal ranges Your CBC shows mild anemia.  You appear to have an ongoing anemia compared to previous results but it looks like your hemoglobin is a little bit lower than your typical range.  Please let us know if you have been showing any signs of bleeding such as dark, tarry stools, active bleeding. Your cholesterol looks great Please let us know if you have further questions or concerns

## 2023-12-02 ENCOUNTER — Other Ambulatory Visit: Payer: Self-pay | Admitting: Family Medicine

## 2023-12-14 ENCOUNTER — Telehealth: Payer: Self-pay

## 2023-12-14 NOTE — Progress Notes (Signed)
 Care Guide Pharmacy Note  12/14/2023 Name: Donald STOGSDILL Sr. MRN: [id number] DOB: 06-22-1966  Referred By: Leavy Mole, PA-C Reason for referral: Care Coordination ([facility] Diabetes. )   Donald D Krajewski Sr. is a 58 y.o. year old male who is a primary care patient of Tapia, Leisa, PA-C.  Donald D Bellanca Sr. was referred to the pharmacist for assistance related to: DMII  Successful contact was made with the patient to discuss pharmacy services.  Patient declines engagement at this time. Contact information was provided to the patient should they wish to reach out for assistance at a later time.  Dreama Lynwood [facility] Assistant [phone]

## 2024-02-02 ENCOUNTER — Encounter: Payer: Self-pay | Admitting: Family Medicine

## 2024-02-02 ENCOUNTER — Ambulatory Visit: Payer: Medicare Other | Admitting: Family Medicine

## 2024-02-02 ENCOUNTER — Ambulatory Visit: Payer: Medicare Other | Admitting: Physician Assistant

## 2024-02-02 VITALS — BP 124/68 | HR 73 | Resp 16 | Ht 71.0 in | Wt 217.0 lb

## 2024-02-02 DIAGNOSIS — E1169 Type 2 diabetes mellitus with other specified complication: Secondary | ICD-10-CM | POA: Diagnosis not present

## 2024-02-02 DIAGNOSIS — G8929 Other chronic pain: Secondary | ICD-10-CM

## 2024-02-02 DIAGNOSIS — I10 Essential (primary) hypertension: Secondary | ICD-10-CM | POA: Diagnosis not present

## 2024-02-02 DIAGNOSIS — D649 Anemia, unspecified: Secondary | ICD-10-CM | POA: Diagnosis not present

## 2024-02-02 DIAGNOSIS — E119 Type 2 diabetes mellitus without complications: Secondary | ICD-10-CM

## 2024-02-02 DIAGNOSIS — Z8673 Personal history of transient ischemic attack (TIA), and cerebral infarction without residual deficits: Secondary | ICD-10-CM

## 2024-02-02 DIAGNOSIS — E785 Hyperlipidemia, unspecified: Secondary | ICD-10-CM

## 2024-02-02 DIAGNOSIS — R519 Headache, unspecified: Secondary | ICD-10-CM

## 2024-02-02 DIAGNOSIS — E1165 Type 2 diabetes mellitus with hyperglycemia: Secondary | ICD-10-CM

## 2024-02-02 DIAGNOSIS — Z794 Long term (current) use of insulin: Secondary | ICD-10-CM | POA: Diagnosis not present

## 2024-02-02 DIAGNOSIS — I739 Peripheral vascular disease, unspecified: Secondary | ICD-10-CM

## 2024-02-02 DIAGNOSIS — E66811 Obesity, class 1: Secondary | ICD-10-CM

## 2024-02-02 DIAGNOSIS — Z6830 Body mass index (BMI) 30.0-30.9, adult: Secondary | ICD-10-CM

## 2024-02-02 MED ORDER — LANTUS SOLOSTAR 100 UNIT/ML ~~LOC~~ SOPN: 20.0000 [IU] | PEN_INJECTOR | Freq: Every day | SUBCUTANEOUS | 1 refills | Status: DC

## 2024-02-02 NOTE — Assessment & Plan Note (Signed)
 See above

## 2024-02-02 NOTE — Assessment & Plan Note (Signed)
 On candesartan 8 mg daily BP well controlled and at goal today with current meds and diet/lifestyle efforts BP Readings from Last 3 Encounters:  02/02/24 124/68  11/03/23 126/68  07/14/23 132/76  No med changes

## 2024-02-02 NOTE — Assessment & Plan Note (Signed)
 On statin for HLD, labs recently done, ldl at goal Lab Results  Component Value Date   CHOL 95 11/03/2023   HDL 40 11/03/2023   LDLCALC 37 11/03/2023   TRIG 95 11/03/2023   CHOLHDL 2.4 11/03/2023  Continue crestor 40 mg and diet/lifestyle efforts

## 2024-02-02 NOTE — Assessment & Plan Note (Addendum)
 Ozempic and tomamax have helped with some weight loss but pt BMI remains in obese category with associated comorbidities HTN, HLD, IDDM uncontrolled, hx of TIA, PAD Wt Readings from Last 5 Encounters:  02/02/24 217 lb (98.4 kg)  11/03/23 208 lb (94.3 kg)  07/14/23 206 lb 14.4 oz (93.8 kg)  06/16/23 207 lb 8 oz (94.1 kg)  10/09/22 215 lb 12.8 oz (97.9 kg)   BMI Readings from Last 5 Encounters:  02/02/24 30.27 kg/m  11/03/23 29.01 kg/m  07/14/23 28.86 kg/m  06/16/23 28.94 kg/m  10/09/22 30.10 kg/m  Consider increase ozempic dose if A1C remains above goal - may also help weight

## 2024-02-02 NOTE — Assessment & Plan Note (Signed)
 A1C improving but not yet at goal Better now that he was able to get back on GLP-1, currently ozempic 1 mg dose weekly, lantus 20 unit daily and metformin Due to DM eye exam Repeat A1C today Could increase ozempic to 2 mg dose and try to get him off insulin, also consider mounjaro if covered by insurance Lab Results  Component Value Date   HGBA1C 7.2 (H) 11/03/2023   HGBA1C 12.4 (H) 06/16/2023   HGBA1C 11.3 03/03/2023   HGBA1C 12.7 (H) 10/09/2022   HGBA1C 13.8 (H) 07/08/2022   HGBA1C 10.6 (H) 04/07/2022

## 2024-02-02 NOTE — Assessment & Plan Note (Signed)
 Hgb last two labs trending down, recheck today If any worsening will need to work up Hemoglobin  Date Value Ref Range Status  11/03/2023 10.9 (L) 13.2 - 17.1 g/dL Final  16/09/9603 54.0 (L) 13.2 - 17.1 g/dL Final  98/10/9146 82.9 13.2 - 17.1 g/dL Final  56/21/3086 57.8 (L) 13.2 - 17.1 g/dL Final

## 2024-02-02 NOTE — Assessment & Plan Note (Signed)
 Sx improved with current management of topamax

## 2024-02-02 NOTE — Assessment & Plan Note (Signed)
 Managed on statin and plavix

## 2024-02-02 NOTE — Progress Notes (Signed)
 Name: Donald SARDO Sr.   MRN: [id number]    DOB: 1966-09-08   Date:02/02/2024       Progress Note  Chief Complaint  Patient presents with   Medical Management of Chronic Issues   Diabetes     Subjective:   Donald D Acord Sr. is a 58 y.o. male, presents to clinic for routine follow up on chronic conditions  DM:   Pt managing DM with lantus 20 units ozempic 1 mg, metformin 1000 mg  Reports good med compliance Pt has no SE from meds. Blood sugars - says they are good around 120, denies any low blood sugar readings Denies: Polyuria, polydipsia, vision changes, neuropathy, hypoglycemia Recent pertinent labs: Lab Results  Component Value Date   HGBA1C 7.2 (H) 11/03/2023   HGBA1C 12.4 (H) 06/16/2023   HGBA1C 11.3 03/03/2023   Lab Results  Component Value Date   MICROALBUR 3.7 06/16/2023   LDLCALC 37 11/03/2023   CREATININE 1.03 11/03/2023   Standard of care and health maintenance: Urine Microalbumin:  done, UTD Foot exam:  due in april DM eye exam:  due - overdue  ACEI/ARB:  yes Statin:  yes   On statin for HLD, labs recently done, ldl at goal Lab Results  Component Value Date   CHOL 95 11/03/2023   HDL 40 11/03/2023   LDLCALC 37 11/03/2023   TRIG 95 11/03/2023   CHOLHDL 2.4 11/03/2023   Hypertension:  Currently managed on candelsartan 8 mg daily Pt reports good med compliance and denies any SE.   Blood pressure today is well controlled. BP Readings from Last 3 Encounters:  02/02/24 124/68  11/03/23 126/68  07/14/23 132/76   Pt denies CP, SOB, exertional sx, LE edema, palpitation, Ha's, visual disturbances, lightheadedness, hypotension, syncope.  Chronic daily HA/IIH - sx much better, currently managed on tomamax, previously on shots and nurtec  Obesity -  Wt Readings from Last 5 Encounters:  02/02/24 217 lb (98.4 kg)  11/03/23 208 lb (94.3 kg)  07/14/23 206 lb 14.4 oz (93.8 kg)  06/16/23 207 lb 8 oz (94.1 kg)  10/09/22 215 lb 12.8 oz (97.9 kg)   BMI  Readings from Last 5 Encounters:  02/02/24 30.27 kg/m  11/03/23 29.01 kg/m  07/14/23 28.86 kg/m  06/16/23 28.94 kg/m  10/09/22 30.10 kg/m        Current Outpatient Medications:    aspirin EC 81 MG EC tablet, Take 1 tablet (81 mg total) by mouth daily., Disp: , Rfl:    Blood Glucose Monitoring Suppl (D-CARE GLUCOMETER) w/Device KIT, 1 Units by Does not apply route 4 (four) times daily -  before meals and at bedtime., Disp: 1 kit, Rfl: 0   candesartan (ATACAND) 8 MG tablet, Take 1 tablet (8 mg total) by mouth daily., Disp: 100 tablet, Rfl: 1   clopidogrel (PLAVIX) 75 MG tablet, Take 1 tablet (75 mg total) by mouth daily., Disp: 100 tablet, Rfl: 1   insulin glargine (LANTUS SOLOSTAR) 100 UNIT/ML Solostar Pen, INJECT SUBCUTANEOUSLY 20 TO 50  UNITS DAILY, Disp: 60 mL, Rfl: 0   Insulin Pen Needle (BD PEN NEEDLE MICRO U/F) 32G X 6 MM MISC, USE FOR LEVEMIR INJECTION ONCE TO TWICE DAILY, Disp: 200 each, Rfl: 1   metFORMIN (GLUCOPHAGE) 1000 MG tablet, Take 1 tablet (1,000 mg total) by mouth 2 (two) times daily with a meal., Disp: 200 tablet, Rfl: 1   rosuvastatin (CRESTOR) 40 MG tablet, Take 1 tablet (40 mg total) by mouth daily., Disp: 100 tablet,  Rfl: 1   Semaglutide, 1 MG/DOSE, (OZEMPIC, 1 MG/DOSE,) 4 MG/3ML SOPN, INJECT SUBCUTANEOUSLY 1 MG EVERY WEEK, Disp: 3 mL, Rfl: 1   topiramate (TOPAMAX) 100 MG tablet, Take 100 mg by mouth 2 (two) times daily., Disp: , Rfl:    NURTEC 75 MG TBDP, Take 1 tablet by mouth as directed. 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   Class 1 obesity with serious comorbidity and body mass index (BMI) of 32.0 to 32.9 in adult 05/27/2018   Edema of esophagus    Angiodysplasia of stomach and duodenum    Stomach irritation    Heartburn    Current smoker 05/11/2018   Hypertension 09/15/2017   Hx of transient ischemic attack (TIA) 08/05/2017   Peripheral vascular insufficiency (HCC) 07/06/2017   Hyperlipidemia associated with type 2 diabetes mellitus (HCC) 10/31/2016    Past Surgical History:  Procedure Laterality Date   ESOPHAGOGASTRODUODENOSCOPY (EGD) WITH PROPOFOL N/A 05/26/2018   Procedure: ESOPHAGOGASTRODUODENOSCOPY (EGD) WITH PROPOFOL;  Surgeon: Pasty Spillers, MD;  Location: [facility] ENDOSCOPY;  Service: Endoscopy;  Laterality: N/A;   KNEE SURGERY Right     Family History  Problem Relation Age of Onset   Hypertension Father    Diabetes Father    Liver disease Father    Diverticulitis Mother    Migraines Mother    Cancer Sister        tumors in the breast    Diabetes Maternal Grandfather    Cancer Sister        tumors in the breast    Cancer Sister        tumor in the breast     Social History   Tobacco Use   Smoking status: Every Day    Current packs/day: 0.50    Average packs/day: 0.5 packs/day for 30.0 years (15.0 ttl pk-yrs)    Types: Cigarettes   Smokeless tobacco: Never  Vaping Use   Vaping status: Never Used  Substance Use Topics   Alcohol use: Yes    Alcohol/week: 0.0 standard drinks of alcohol    Comment: none in the last 3 weeks   Drug use: No     Allergies  Allergen Reactions   No Known Allergies     Health Maintenance  Topic Date Due   Medicare Annual Wellness (AWV)  Never done   OPHTHALMOLOGY EXAM  02/02/2024 (Originally 04/24/2023)   COVID-19 Vaccine (3 - 2024-25 season) 02/17/2024 (Originally 08/02/2023)   INFLUENZA VACCINE  02/29/2024 (Originally 07/02/2023)    Zoster Vaccines- Shingrix (1 of 2) 05/04/2024 (Originally 06/13/2014)   DTaP/Tdap/Td (1 - Tdap) 11/02/2024 (Originally 06/14/1983)   FOOT EXAM  03/02/2024   HEMOGLOBIN A1C  05/03/2024   Diabetic kidney evaluation - Urine ACR  06/15/2024   Diabetic kidney evaluation - eGFR measurement  11/02/2024   Colonoscopy  12/01/2024   Pneumococcal Vaccine 83-33 Years old  Completed   Hepatitis C Screening  Completed   HIV Screening  Completed   HPV VACCINES  Aged Out  Chart Review Today: I personally reviewed active problem list, medication list, allergies, family history, social history, health maintenance, notes from last encounter, lab results, imaging with the patient/caregiver today.   Review of Systems  Constitutional: Negative.   HENT: Negative.    Eyes: Negative.   Respiratory: Negative.    Cardiovascular: Negative.   Gastrointestinal: Negative.   Endocrine: Negative.   Genitourinary: Negative.   Musculoskeletal: Negative.   Skin: Negative.   Allergic/Immunologic: Negative.   Neurological: Negative.   Hematological: Negative.   Psychiatric/Behavioral: Negative.    All other systems reviewed and are negative.    Objective:   Vitals:   02/02/24 0900  BP: 124/68  Pulse: 73  Resp: 16  SpO2: 96%  Weight: 217 lb (98.4 kg)  Height: 5\' 11"  (1.803 m)    Body mass index is 30.27 kg/m.  Physical Exam Vitals and nursing note reviewed.  Constitutional:      General: He is not in acute distress.    Appearance: Normal appearance. He is well-developed. He is not ill-appearing, toxic-appearing or diaphoretic.  HENT:     Head: Normocephalic and atraumatic.     Nose: Nose normal.  Eyes:     General:        Right eye: No discharge.        Left eye: No discharge.     Conjunctiva/sclera: Conjunctivae normal.  Neck:     Trachea: No tracheal deviation.  Cardiovascular:     Rate and Rhythm: Normal rate and regular rhythm.     Pulses: Normal pulses.     Heart sounds: Normal  heart sounds.  Pulmonary:     Effort: Pulmonary effort is normal. No respiratory distress.     Breath sounds: No stridor.  Musculoskeletal:        General: Normal range of motion.  Skin:    General: Skin is warm and dry.     Findings: No rash.  Neurological:     Mental Status: He is alert.     Motor: No abnormal muscle tone.     Coordination: Coordination normal.  Psychiatric:        Mood and Affect: Mood normal.        Behavior: Behavior normal.      Functional Status Survey:   Results for orders placed or performed in visit on 11/03/23  HgB A1c   Collection Time: 11/03/23 11:33 AM  Result Value Ref Range   Hgb A1c MFr Bld 7.2 (H) <5.7 % of total Hgb   Mean Plasma Glucose 160 mg/dL   eAG (mmol/L) 8.9 mmol/L  Lipid Profile   Collection Time: 11/03/23 11:33 AM  Result Value Ref Range   Cholesterol 95 <200 mg/dL   HDL 40 > OR = 40 mg/dL   Triglycerides 95 <161 mg/dL   LDL Cholesterol (Calc) 37 mg/dL (calc)   Total CHOL/HDL Ratio 2.4 <5.0 (calc)   Non-HDL Cholesterol (Calc) 55 <096 mg/dL (calc)  COMPLETE METABOLIC PANEL WITH GFR   Collection Time: 11/03/23 11:33 AM  Result Value Ref Range   Glucose, Bld 155 (H) 65 - 99 mg/dL   BUN 13 7 - 25 mg/dL   Creat 0.45 4.09 - 8.11 mg/dL   eGFR 84 > OR = 60 BJ/YNW/2.95A2   BUN/Creatinine Ratio SEE NOTE: 6 - 22 (calc)   Sodium 141 135 - 146 mmol/L   Potassium 4.6 3.5 - 5.3 mmol/L   Chloride 107 98 - 110 mmol/L   CO2 26 20 - 32 mmol/L  Calcium 9.9 8.6 - 10.3 mg/dL   Total Protein 7.6 6.1 - 8.1 g/dL   Albumin 4.1 3.6 - 5.1 g/dL   Globulin 3.5 1.9 - 3.7 g/dL (calc)   AG Ratio 1.2 1.0 - 2.5 (calc)   Total Bilirubin 0.2 0.2 - 1.2 mg/dL   Alkaline phosphatase (APISO) 78 35 - 144 U/L   AST 12 10 - 35 U/L   ALT 16 9 - 46 U/L  CBC w/Diff/Platelet   Collection Time: 11/03/23 11:33 AM  Result Value Ref Range   WBC 9.3 3.8 - 10.8 Thousand/uL   RBC 3.66 (L) 4.20 - 5.80 Million/uL   Hemoglobin 10.9 (L) 13.2 - 17.1 g/dL   HCT 04.5  (L) 40.9 - 50.0 %   MCV 93.2 80.0 - 100.0 fL   MCH 29.8 27.0 - 33.0 pg   MCHC 32.0 32.0 - 36.0 g/dL   RDW 81.1 91.4 - 78.2 %   Platelets 383 140 - 400 Thousand/uL   MPV 11.1 7.5 - 12.5 fL   Neutro Abs 5,831 1,500 - 7,800 cells/uL   Absolute Lymphocytes 2,511 850 - 3,900 cells/uL   Absolute Monocytes 735 200 - 950 cells/uL   Eosinophils Absolute 121 15 - 500 cells/uL   Basophils Absolute 102 0 - 200 cells/uL   Neutrophils Relative % 62.7 %   Total Lymphocyte 27.0 %   Monocytes Relative 7.9 %   Eosinophils Relative 1.3 %   Basophils Relative 1.1 %      Assessment & Plan:   Hyperlipidemia associated with type 2 diabetes mellitus (HCC) Assessment & Plan: On statin for HLD, labs recently done, ldl at goal Lab Results  Component Value Date   CHOL 95 11/03/2023   HDL 40 11/03/2023   LDLCALC 37 11/03/2023   TRIG 95 11/03/2023   CHOLHDL 2.4 11/03/2023  Continue crestor 40 mg and diet/lifestyle efforts  Orders: -     Hemoglobin A1c  Primary hypertension Assessment & Plan: On candesartan 8 mg daily BP well controlled and at goal today with current meds and diet/lifestyle efforts BP Readings from Last 3 Encounters:  02/02/24 124/68  11/03/23 126/68  07/14/23 132/76  No med changes   Peripheral vascular insufficiency (HCC) Assessment & Plan: Managed on statin and plavix   Hx of transient ischemic attack (TIA)  Anemia, unspecified type Assessment & Plan: Hgb last two labs trending down, recheck today If any worsening will need to work up Hemoglobin  Date Value Ref Range Status  11/03/2023 10.9 (L) 13.2 - 17.1 g/dL Final  95/62/1308 65.7 (L) 13.2 - 17.1 g/dL Final  84/69/6295 28.4 13.2 - 17.1 g/dL Final  13/24/4010 27.2 (L) 13.2 - 17.1 g/dL Final     Orders: -     CBC with Differential/Platelet  Insulin dependent type 2 diabetes mellitus (HCC) Assessment & Plan: See above  Orders: -     Lantus SoloStar; Inject 20-60 Units into the skin daily.  Dispense:  60 mL; Refill: 1 -     Ambulatory referral to Ophthalmology  Uncontrolled type 2 diabetes mellitus with hyperglycemia (HCC) Assessment & Plan: A1C improving but not yet at goal Better now that he was able to get back on GLP-1, currently ozempic 1 mg dose weekly, lantus 20 unit daily and metformin Due to DM eye exam Repeat A1C today Could increase ozempic to 2 mg dose and try to get him off insulin, also consider mounjaro if covered by insurance Lab Results  Component Value Date   HGBA1C 7.2 (H)  11/03/2023   HGBA1C 12.4 (H) 06/16/2023   HGBA1C 11.3 03/03/2023   HGBA1C 12.7 (H) 10/09/2022   HGBA1C 13.8 (H) 07/08/2022   HGBA1C 10.6 (H) 04/07/2022     Orders: -     Lantus SoloStar; Inject 20-60 Units into the skin daily.  Dispense: 60 mL; Refill: 1  Class 1 obesity with serious comorbidity and body mass index (BMI) of 30.0 to 30.9 in adult, unspecified obesity type Assessment & Plan: Ozempic and tomamax have helped with some weight loss but pt BMI remains in obese category with associated comorbidities HTN, HLD, IDDM uncontrolled, hx of TIA, PAD Wt Readings from Last 5 Encounters:  02/02/24 217 lb (98.4 kg)  11/03/23 208 lb (94.3 kg)  07/14/23 206 lb 14.4 oz (93.8 kg)  06/16/23 207 lb 8 oz (94.1 kg)  10/09/22 215 lb 12.8 oz (97.9 kg)   BMI Readings from Last 5 Encounters:  02/02/24 30.27 kg/m  11/03/23 29.01 kg/m  07/14/23 28.86 kg/m  06/16/23 28.94 kg/m  10/09/22 30.10 kg/m  Consider increase ozempic dose if A1C remains above goal - may also help weight    Chronic intractable headache, unspecified headache type Assessment & Plan: Sx improved with current management of topamax      Return in about 3 months (around 05/04/2024) for Routine follow-up.   Danelle Berry, PA-C 02/02/24 9:15 AM

## 2024-02-03 LAB — CBC WITH DIFFERENTIAL/PLATELET
Absolute Lymphocytes: 2584 {cells}/uL (ref 850–3900)
Absolute Monocytes: 789 {cells}/uL (ref 200–950)
Basophils Absolute: 67 {cells}/uL (ref 0–200)
Basophils Relative: 0.7 %
Eosinophils Absolute: 238 {cells}/uL (ref 15–500)
Eosinophils Relative: 2.5 %
HCT: 34.5 % — ABNORMAL LOW (ref 38.5–50.0)
Hemoglobin: 11.2 g/dL — ABNORMAL LOW (ref 13.2–17.1)
MCH: 28.9 pg (ref 27.0–33.0)
MCHC: 32.5 g/dL (ref 32.0–36.0)
MCV: 89.1 fL (ref 80.0–100.0)
MPV: 10.7 fL (ref 7.5–12.5)
Monocytes Relative: 8.3 %
Neutro Abs: 5824 {cells}/uL (ref 1500–7800)
Neutrophils Relative %: 61.3 %
Platelets: 336 10*3/uL (ref 140–400)
RBC: 3.87 10*6/uL — ABNORMAL LOW (ref 4.20–5.80)
RDW: 13.7 % (ref 11.0–15.0)
Total Lymphocyte: 27.2 %
WBC: 9.5 10*3/uL (ref 3.8–10.8)

## 2024-02-03 LAB — HEMOGLOBIN A1C
Hgb A1c MFr Bld: 7.6 %{Hb} — ABNORMAL HIGH (ref <5.7)
Mean Plasma Glucose: 171 mg/dL
eAG (mmol/L): 9.5 mmol/L

## 2024-02-16 ENCOUNTER — Ambulatory Visit: Admitting: Family Medicine

## 2024-02-16 ENCOUNTER — Encounter: Payer: Self-pay | Admitting: Family Medicine

## 2024-02-16 VITALS — BP 122/70 | HR 81 | Resp 16 | Ht 71.0 in | Wt 217.0 lb

## 2024-02-16 DIAGNOSIS — E1165 Type 2 diabetes mellitus with hyperglycemia: Secondary | ICD-10-CM

## 2024-02-16 DIAGNOSIS — Z794 Long term (current) use of insulin: Secondary | ICD-10-CM

## 2024-02-16 MED ORDER — OZEMPIC (1 MG/DOSE) 4 MG/3ML ~~LOC~~ SOPN
1.0000 mg | PEN_INJECTOR | SUBCUTANEOUS | 1 refills | Status: AC
Start: 2024-02-16 — End: ?

## 2024-02-16 NOTE — Progress Notes (Signed)
 Patient ID: Donald MAHRT Sr., male    DOB: 1966-10-31, 58 y.o.   MRN: [id number]  PCP: Danelle Berry, PA-C  Chief Complaint  Patient presents with   Follow-up    Subjective:   Donald Pevehouse Millstein Sr. is a 58 y.o. male, presents to clinic with CC of the following:  HPI  Here for f/up on uncontrolled DM after labs, he did not bring glucometer with him Lab Results  Component Value Date   HGBA1C 7.6 (H) 02/02/2024  He states he has been drinking tea and making his sugars higher  After his last OV 2 weeks ago he started checking blood sugars and it was 260's and he stopped drinking the tea He also had gained some weight Blood sugar this am was 110  Doing insulin 20 units daily at bedtime     Patient Active Problem List   Diagnosis Date Noted   Intractable chronic migraine without aura and without status migrainosus 08/20/2022   Uncontrolled type 2 diabetes mellitus with hyperglycemia (HCC) 07/17/2022   Insulin dependent type 2 diabetes mellitus (HCC) 07/17/2022   Anemia 04/07/2022   Carotid stenosis 01/17/2022   Chronic intractable headache 02/10/2020   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   Class 1 obesity with serious comorbidity and body mass index (BMI) of 30.0 to 30.9 in adult 05/27/2018   Angiodysplasia of stomach and duodenum    Current smoker 05/11/2018   Hypertension 09/15/2017   Hx of transient ischemic attack (TIA) 08/05/2017   Peripheral vascular insufficiency (HCC) 07/06/2017   Hyperlipidemia associated with type 2 diabetes mellitus (HCC) 10/31/2016      Current Outpatient Medications:    aspirin EC 81 MG EC tablet, Take 1 tablet (81 mg total) by mouth daily., Disp: , Rfl:    Blood Glucose Monitoring Suppl (D-CARE GLUCOMETER) w/Device KIT, 1 Units by Does not apply route 4 (four) times daily -  before meals and at bedtime., Disp: 1 kit, Rfl: 0   candesartan (ATACAND) 8 MG tablet, Take 1 tablet (8 mg total) by mouth daily., Disp: 100 tablet, Rfl: 1   clopidogrel  (PLAVIX) 75 MG tablet, Take 1 tablet (75 mg total) by mouth daily., Disp: 100 tablet, Rfl: 1   insulin glargine (LANTUS SOLOSTAR) 100 UNIT/ML Solostar Pen, Inject 20-60 Units into the skin daily., Disp: 60 mL, Rfl: 1   Insulin Pen Needle (BD PEN NEEDLE MICRO U/F) 32G X 6 MM MISC, USE FOR LEVEMIR INJECTION ONCE TO TWICE DAILY, Disp: 200 each, Rfl: 1   metFORMIN (GLUCOPHAGE) 1000 MG tablet, Take 1 tablet (1,000 mg total) by mouth 2 (two) times daily with a meal., Disp: 200 tablet, Rfl: 1   rosuvastatin (CRESTOR) 40 MG tablet, Take 1 tablet (40 mg total) by mouth daily., Disp: 100 tablet, Rfl: 1   Semaglutide, 1 MG/DOSE, (OZEMPIC, 1 MG/DOSE,) 4 MG/3ML SOPN, INJECT SUBCUTANEOUSLY 1 MG EVERY WEEK, Disp: 3 mL, Rfl: 1   topiramate (TOPAMAX) 100 MG tablet, Take 100 mg by mouth 2 (two) times daily., Disp: , Rfl:    Allergies  Allergen Reactions   No Known Allergies      Social History   Tobacco Use   Smoking status: Every Day    Current packs/day: 0.50    Average packs/day: 0.5 packs/day for 30.0 years (15.0 ttl pk-yrs)    Types: Cigarettes   Smokeless tobacco: Never  Vaping Use   Vaping status: Never Used  Substance Use Topics   Alcohol use: Yes    Alcohol/week: 0.0  standard drinks of alcohol    Comment: none in the last 3 weeks   Drug use: No      Chart Review Today: I personally reviewed active problem list, medication list, allergies, family history, social history, health maintenance, notes from last encounter, lab results, imaging with the patient/caregiver today.   Review of Systems  Constitutional: Negative.   HENT: Negative.    Eyes: Negative.   Respiratory: Negative.    Cardiovascular: Negative.   Gastrointestinal: Negative.   Endocrine: Negative.   Genitourinary: Negative.   Musculoskeletal: Negative.   Skin: Negative.   Allergic/Immunologic: Negative.   Neurological: Negative.   Hematological: Negative.   Psychiatric/Behavioral: Negative.    All other systems  reviewed and are negative.      Objective:   Vitals:   02/16/24 0831  BP: 122/70  Pulse: 81  Resp: 16  SpO2: 99%  Weight: 217 lb (98.4 kg)  Height: 5\' 11"  (1.803 m)    Body mass index is 30.27 kg/m.  Physical Exam Vitals and nursing note reviewed.  Constitutional:      Appearance: He is well-developed.  HENT:     Head: Normocephalic and atraumatic.     Nose: Nose normal.  Eyes:     General:        Right eye: No discharge.        Left eye: No discharge.     Conjunctiva/sclera: Conjunctivae normal.  Neck:     Trachea: No tracheal deviation.  Cardiovascular:     Rate and Rhythm: Normal rate and regular rhythm.  Pulmonary:     Effort: Pulmonary effort is normal. No respiratory distress.     Breath sounds: No stridor.  Musculoskeletal:        General: Normal range of motion.  Skin:    General: Skin is warm and dry.     Findings: No rash.  Neurological:     Mental Status: He is alert.     Motor: No abnormal muscle tone.     Coordination: Coordination normal.  Psychiatric:        Behavior: Behavior normal.      Results for orders placed or performed in visit on 02/02/24  HgB A1c   Collection Time: 02/02/24  9:29 AM  Result Value Ref Range   Hgb A1c MFr Bld 7.6 (H) <5.7 % of total Hgb   Mean Plasma Glucose 171 mg/dL   eAG (mmol/L) 9.5 mmol/L  CBC with Differential/Platelet   Collection Time: 02/02/24  9:29 AM  Result Value Ref Range   WBC 9.5 3.8 - 10.8 Thousand/uL   RBC 3.87 (L) 4.20 - 5.80 Million/uL   Hemoglobin 11.2 (L) 13.2 - 17.1 g/dL   HCT 14.7 (L) 82.9 - 56.2 %   MCV 89.1 80.0 - 100.0 fL   MCH 28.9 27.0 - 33.0 pg   MCHC 32.5 32.0 - 36.0 g/dL   RDW 13.0 86.5 - 78.4 %   Platelets 336 140 - 400 Thousand/uL   MPV 10.7 7.5 - 12.5 fL   Neutro Abs 5,824 1,500 - 7,800 cells/uL   Absolute Lymphocytes 2,584 850 - 3,900 cells/uL   Absolute Monocytes 789 200 - 950 cells/uL   Eosinophils Absolute 238 15 - 500 cells/uL   Basophils Absolute 67 0 - 200  cells/uL   Neutrophils Relative % 61.3 %   Total Lymphocyte 27.2 %   Monocytes Relative 8.3 %   Eosinophils Relative 2.5 %   Basophils Relative 0.7 %  Assessment & Plan:   1. Uncontrolled type 2 diabetes mellitus with hyperglycemia (HCC) (Primary) Managed on metformin, insulin and ozempic (unclear if he has been compliant with ozempic) Last OV labs showed increased A1C Lab Results  Component Value Date   HGBA1C 7.6 (H) 02/02/2024  He reports he has gained weight and was drinking a lot of iced tea he is going to decrease his tea intake Monitor morning blood sugars daily to help adjust insulin if needed he states his morning blood sugar today was 110 Reviewed basal insulin titration instructions and goal morning blood sugars with him and they were also typed up and printed on his AVS  We will plan to have him do labs prior to his regular office visits since he is not getting the results or messages or understanding the plan when we call him with his result notes.  We will repeat A1c after June 4 and then do his appointment shortly after He was asked to bring his glucometer with him or a record of his blood sugar readings  - Semaglutide, 1 MG/DOSE, (OZEMPIC, 1 MG/DOSE,) 4 MG/3ML SOPN; Inject 1 mg into the skin once a week.  Dispense: 9 mL; Refill: 1      Danelle Berry, PA-C 02/16/24 8:42 AM

## 2024-02-16 NOTE — Patient Instructions (Addendum)
 Goal morning sugars 80-130 If your sugars in the morning are over 130's then you need to increase your insulin by 2 units and wait three days and continue to monitor your morning fasting sugars   Long Acting Insulin:  You will keep taking 20 units, administer daily in am (same time of day if able).  Begin checking your fasting blood sugar every morning - keep record on log or make sure always bring meter with you to appointments.  Goal range for fasting blood sugars:  80-130 - keep insulin dose the same    >130 - Increase insulin dose by 2-3 units and do not adjust again for 3 days    <80 or under 100 and feeling symptomatic - decrease insulin dose by 2-3 units and do not adjust again for 3 days  Example of how to titrate:  Fasting blood sugars around 180's every morning and I am taking 18 units daily Increase dose to 20 or 21, give at the same time for the next 3 days, and continue to watch morning fasting blood sugars. If after 3 days the blood sugar has not come into normal range, then can increase again, + 2 or 3 = 23 or 24 units, and then give 24 units daily for the next 3-4 days and continue to monitor the fasting blood sugars.  Please reach out to me and let me know when you get to fasting blood sugars in goal range and what dose. We will record the dose in your chart, and keep you at that dose.  Bring blood sugar log or glucometer with you to office visit.

## 2024-02-21 LAB — LAB REPORT - SCANNED: Microalb Creat Ratio: 29.999

## 2024-04-05 ENCOUNTER — Other Ambulatory Visit: Payer: Self-pay | Admitting: Family Medicine

## 2024-04-05 DIAGNOSIS — E119 Type 2 diabetes mellitus without complications: Secondary | ICD-10-CM

## 2024-04-05 DIAGNOSIS — E1169 Type 2 diabetes mellitus with other specified complication: Secondary | ICD-10-CM

## 2024-04-05 DIAGNOSIS — E669 Obesity, unspecified: Secondary | ICD-10-CM

## 2024-04-06 NOTE — Telephone Encounter (Signed)
 Requested Prescriptions  Pending Prescriptions Disp Refills   rosuvastatin  (CRESTOR ) 40 MG tablet [Pharmacy Med Name: Rosuvastatin  Calcium  40 MG Oral Tablet] 100 tablet 1    Sig: TAKE 1 TABLET BY MOUTH DAILY     Cardiovascular:  Antilipid - Statins 2 Failed - 04/06/2024  3:21 PM      Failed - Lipid Panel in normal range within the last 12 months    Cholesterol  Date Value Ref Range Status  11/03/2023 95 <200 mg/dL Final   LDL Cholesterol (Calc)  Date Value Ref Range Status  11/03/2023 37 mg/dL (calc) Final    Comment:    Reference range: <100 . Desirable range <100 mg/dL for primary prevention;   <70 mg/dL for patients with CHD or diabetic patients  with > or = 2 CHD risk factors. Aaron Aas LDL-C is now calculated using the [name]-Hopkins  calculation, which is a validated novel method providing  better accuracy than the Friedewald equation in the  estimation of LDL-C.  Melinda Sprawls et al. Erroll Heard. 4098;119(14): 2061-2068  (http://education.[facility].com/faq/FAQ164)    HDL  Date Value Ref Range Status  11/03/2023 40 > OR = 40 mg/dL Final   Triglycerides  Date Value Ref Range Status  11/03/2023 95 <150 mg/dL Final         Passed - Cr in normal range and within 360 days    Creat  Date Value Ref Range Status  11/03/2023 1.03 0.70 - 1.30 mg/dL Final   Creatinine, POC  Date Value Ref Range Status  07/31/2016 NEG mg/dL Final   Creatinine, Urine  Date Value Ref Range Status  06/16/2023 235 20 - 320 mg/dL Final         Passed - Patient is not pregnant      Passed - Valid encounter within last 12 months    Recent Outpatient Visits           1 month ago Uncontrolled type 2 diabetes mellitus with hyperglycemia (HCC)   [place] [facility] Adeline Hone, PA-C   2 months ago Hyperlipidemia associated with type 2 diabetes mellitus [facility])   [place] [facility] Adeline Hone, PA-C       Future Appointments             In 1 month Adeline Hone, PA-C [facility]             clopidogrel  (PLAVIX ) 75 MG tablet [Pharmacy Med Name: Clopidogrel  Bisulfate 75 MG Oral Tablet] 100 tablet     Sig: TAKE 1 TABLET BY MOUTH DAILY     Hematology: Antiplatelets - clopidogrel  Failed - 04/06/2024  3:21 PM      Failed - HCT in normal range and within 180 days    HCT  Date Value Ref Range Status  02/02/2024 34.5 (L) 38.5 - 50.0 % Final         Failed - HGB in normal range and within 180 days    Hemoglobin  Date Value Ref Range Status  02/02/2024 11.2 (L) 13.2 - 17.1 g/dL Final         Passed - PLT in normal range and within 180 days    Platelets  Date Value Ref Range Status  02/02/2024 336 140 - 400 Thousand/uL Final         Passed - Cr in normal range and within 360 days    Creat  Date Value Ref Range Status  11/03/2023 1.03 0.70 - 1.30 mg/dL Final  Creatinine, POC  Date Value Ref Range Status  07/31/2016 NEG mg/dL Final   Creatinine, Urine  Date Value Ref Range Status  06/16/2023 235 20 - 320 mg/dL Final         Passed - Valid encounter within last 6 months    Recent Outpatient Visits           1 month ago Uncontrolled type 2 diabetes mellitus with hyperglycemia (HCC)   [place] [facility] Adeline Hone, PA-C   2 months ago Hyperlipidemia associated with type 2 diabetes mellitus [facility])   [place] [facility] Adeline Hone, PA-C       Future Appointments             In 1 month Adeline Hone, PA-C [place] [facility]             metFORMIN  (GLUCOPHAGE ) 1000 MG tablet [Pharmacy Med Name: metFORMIN  HCl 1000 MG Oral Tablet] 200 tablet 0    Sig: TAKE 1 TABLET BY MOUTH TWICE  DAILY WITH MEALS     Endocrinology:  Diabetes - Biguanides Failed - 04/06/2024  3:21 PM      Failed - B12 Level in normal range and within 720 days    No results found for: "VITAMINB12"       Passed - Cr in normal range and within 360 days    Creat   Date Value Ref Range Status  11/03/2023 1.03 0.70 - 1.30 mg/dL Final   Creatinine, POC  Date Value Ref Range Status  07/31/2016 NEG mg/dL Final   Creatinine, Urine  Date Value Ref Range Status  06/16/2023 235 20 - 320 mg/dL Final         Passed - HBA1C is between 0 and 7.9 and within 180 days    Hemoglobin A1C  Date Value Ref Range Status  03/03/2023 11.3  Final   Hgb A1c MFr Bld  Date Value Ref Range Status  02/02/2024 7.6 (H) <5.7 % of total Hgb Final    Comment:    For someone without known diabetes, a hemoglobin A1c value of 6.5% or greater indicates that they may have  diabetes and this should be confirmed with a follow-up  test. . For someone with known diabetes, a value <7% indicates  that their diabetes is well controlled and a value  greater than or equal to 7% indicates suboptimal  control. A1c targets should be individualized based on  duration of diabetes, age, comorbid conditions, and  other considerations. . Currently, no consensus exists regarding use of hemoglobin A1c for diagnosis of diabetes for children. .          Passed - eGFR in normal range and within 360 days    GFR, Est African American  Date Value Ref Range Status  03/26/2021 76 > OR = 60 mL/min/1.35m2 Final   GFR, Est Non African American  Date Value Ref Range Status  03/26/2021 65 > OR = 60 mL/min/1.80m2 Final   eGFR  Date Value Ref Range Status  11/03/2023 84 > OR = 60 mL/min/1.24m2 Final         Passed - Valid encounter within last 6 months    Recent Outpatient Visits           1 month ago Uncontrolled type 2 diabetes mellitus with hyperglycemia [facility])   [place] [facility] Adeline Hone, PA-C   2 months ago Hyperlipidemia associated with type 2 diabetes mellitus (HCC)   [facility] Adeline Hone, PA-C       Future Appointments             In 1 month Adeline Hone, PA-C [facility]             Passed - CBC within normal limits and completed in the last 12 months    WBC  Date Value Ref Range Status  02/02/2024 9.5 3.8 - 10.8 Thousand/uL Final   RBC  Date Value Ref Range Status  02/02/2024 3.87 (L) 4.20 - 5.80 Million/uL Final   Hemoglobin  Date Value Ref Range Status  02/02/2024 11.2 (L) 13.2 - 17.1 g/dL Final   HCT  Date Value Ref Range Status  02/02/2024 34.5 (L) 38.5 - 50.0 % Final   MCHC  Date Value Ref Range Status  02/02/2024 32.5 32.0 - 36.0 g/dL Final    Comment:    For adults, a slight decrease in the calculated MCHC value (in the range of 30 to 32 g/dL) is most likely not clinically significant; however, it should be interpreted with caution in correlation with other red cell parameters and the patient's clinical condition.    [facility]  Date Value Ref Range Status  02/02/2024 28.9 27.0 - 33.0 pg Final   MCV  Date Value Ref Range Status  02/02/2024 89.1 80.0 - 100.0 fL Final   No results found for: "PLTCOUNTKUC", "LABPLAT", "POCPLA" RDW  Date Value Ref Range Status  02/02/2024 13.7 11.0 - 15.0 % Final          candesartan  (ATACAND ) 8 MG tablet [Pharmacy Med Name: CANDESARTAN   8MG   TAB] 100 tablet 0    Sig: TAKE 1 TABLET BY MOUTH DAILY     Cardiovascular:  Angiotensin Receptor Blockers - candesartan  cilexetil Passed - 04/06/2024  3:21 PM      Passed - K in normal range and within 180 days    Potassium  Date Value Ref Range Status  11/03/2023 4.6 3.5 - 5.3 mmol/L Final         Passed - Cr in normal range and within 180 days    Creat  Date Value Ref Range Status  11/03/2023 1.03 0.70 - 1.30 mg/dL Final   Creatinine, POC  Date Value Ref Range Status  07/31/2016 NEG mg/dL Final   Creatinine, Urine  Date Value Ref Range Status  06/16/2023 235 20 - 320 mg/dL Final         Passed - AST in normal range and within 360 days    AST  Date Value Ref Range Status  11/03/2023 12 10 - 35 U/L Final         Passed - ALT in normal range and  within 360 days    ALT  Date Value Ref Range Status  11/03/2023 16 9 - 46 U/L Final         Passed - Patient is not pregnant      Passed - Last BP in normal range    BP Readings from Last 1 Encounters:  02/16/24 122/70         Passed - Valid encounter within last 6 months    Recent Outpatient Visits           1 month ago Uncontrolled type 2 diabetes mellitus with hyperglycemia [facility])   [facility] Adeline Hone, PA-C   2 months ago Hyperlipidemia associated with type 2 diabetes mellitus [facility])   [place] [facility] Tapia, Leisa, PA-C       Future Appointments             In 1 month Tapia, Leisa, PA-C [facility]

## 2024-04-06 NOTE — Telephone Encounter (Signed)
 Requested medication (s) are due for refill today: yes  Requested medication (s) are on the active medication list: yes  Last refill:  06/17/23 #100 1 RF  Future visit scheduled: yes  Notes to clinic:  abnormal Hgb and Hct   Requested Prescriptions  Pending Prescriptions Disp Refills   clopidogrel  (PLAVIX ) 75 MG tablet [Pharmacy Med Name: Clopidogrel  Bisulfate 75 MG Oral Tablet] 100 tablet     Sig: TAKE 1 TABLET BY MOUTH DAILY     Hematology: Antiplatelets - clopidogrel  Failed - 04/06/2024  3:22 PM      Failed - HCT in normal range and within 180 days    HCT  Date Value Ref Range Status  02/02/2024 34.5 (L) 38.5 - 50.0 % Final         Failed - HGB in normal range and within 180 days    Hemoglobin  Date Value Ref Range Status  02/02/2024 11.2 (L) 13.2 - 17.1 g/dL Final         Passed - PLT in normal range and within 180 days    Platelets  Date Value Ref Range Status  02/02/2024 336 140 - 400 Thousand/uL Final         Passed - Cr in normal range and within 360 days    Creat  Date Value Ref Range Status  11/03/2023 1.03 0.70 - 1.30 mg/dL Final   Creatinine, POC  Date Value Ref Range Status  07/31/2016 NEG mg/dL Final   Creatinine, Urine  Date Value Ref Range Status  06/16/2023 235 20 - 320 mg/dL Final         Passed - Valid encounter within last 6 months    Recent Outpatient Visits           1 month ago Uncontrolled type 2 diabetes mellitus with hyperglycemia [facility])   [place] [facility] Adeline Hone, PA-C   2 months ago Hyperlipidemia associated with type 2 diabetes mellitus [facility])   [facility] Adeline Hone, PA-C       Future Appointments             In 1 month Adeline Hone, PA-C [place] [facility]            Signed Prescriptions Disp Refills   rosuvastatin  (CRESTOR ) 40 MG tablet 100 tablet 1    Sig: TAKE 1 TABLET BY MOUTH DAILY     Cardiovascular:  Antilipid - Statins 2 Failed  - 04/06/2024  3:22 PM      Failed - Lipid Panel in normal range within the last 12 months    Cholesterol  Date Value Ref Range Status  11/03/2023 95 <200 mg/dL Final   LDL Cholesterol (Calc)  Date Value Ref Range Status  11/03/2023 37 mg/dL (calc) Final    Comment:    Reference range: <100 . Desirable range <100 mg/dL for primary prevention;   <70 mg/dL for patients with CHD or diabetic patients  with > or = 2 CHD risk factors. Aaron Aas LDL-C is now calculated using the [name]-Hopkins  calculation, which is a validated novel method providing  better accuracy than the Friedewald equation in the  estimation of LDL-C.  Melinda Sprawls et al. Erroll Heard. 0454;098(11): 2061-2068  (http://education.[facility].com/faq/FAQ164)    HDL  Date Value Ref Range Status  11/03/2023 40 > OR = 40 mg/dL Final   Triglycerides  Date Value Ref Range Status  11/03/2023 95 <150 mg/dL Final  Passed - Cr in normal range and within 360 days    Creat  Date Value Ref Range Status  11/03/2023 1.03 0.70 - 1.30 mg/dL Final   Creatinine, POC  Date Value Ref Range Status  07/31/2016 NEG mg/dL Final   Creatinine, Urine  Date Value Ref Range Status  06/16/2023 235 20 - 320 mg/dL Final         Passed - Patient is not pregnant      Passed - Valid encounter within last 12 months    Recent Outpatient Visits           1 month ago Uncontrolled type 2 diabetes mellitus with hyperglycemia (HCC)   [place] [facility] Adeline Hone, PA-C   2 months ago Hyperlipidemia associated with type 2 diabetes mellitus [facility])   [place] [facility] [facility] Adeline Hone, PA-C       Future Appointments             In 1 month Adeline Hone, PA-C [facility]             metFORMIN  (GLUCOPHAGE ) 1000 MG tablet 200 tablet 0    Sig: TAKE 1 TABLET BY MOUTH TWICE  DAILY WITH MEALS     Endocrinology:  Diabetes - Biguanides Failed - 04/06/2024  3:22 PM       Failed - B12 Level in normal range and within 720 days    No results found for: "VITAMINB12"       Passed - Cr in normal range and within 360 days    Creat  Date Value Ref Range Status  11/03/2023 1.03 0.70 - 1.30 mg/dL Final   Creatinine, POC  Date Value Ref Range Status  07/31/2016 NEG mg/dL Final   Creatinine, Urine  Date Value Ref Range Status  06/16/2023 235 20 - 320 mg/dL Final         Passed - HBA1C is between 0 and 7.9 and within 180 days    Hemoglobin A1C  Date Value Ref Range Status  03/03/2023 11.3  Final   Hgb A1c MFr Bld  Date Value Ref Range Status  02/02/2024 7.6 (H) <5.7 % of total Hgb Final    Comment:    For someone without known diabetes, a hemoglobin A1c value of 6.5% or greater indicates that they may have  diabetes and this should be confirmed with a follow-up  test. . For someone with known diabetes, a value <7% indicates  that their diabetes is well controlled and a value  greater than or equal to 7% indicates suboptimal  control. A1c targets should be individualized based on  duration of diabetes, age, comorbid conditions, and  other considerations. . Currently, no consensus exists regarding use of hemoglobin A1c for diagnosis of diabetes for children. .          Passed - eGFR in normal range and within 360 days    GFR, Est African American  Date Value Ref Range Status  03/26/2021 76 > OR = 60 mL/min/1.25m2 Final   GFR, Est Non African American  Date Value Ref Range Status  03/26/2021 65 > OR = 60 mL/min/1.51m2 Final   eGFR  Date Value Ref Range Status  11/03/2023 84 > OR = 60 mL/min/1.88m2 Final         Passed - Valid encounter within last 6 months    Recent Outpatient Visits           1 month ago Uncontrolled type  2 diabetes mellitus with hyperglycemia [facility])   [place] [facility] Adeline Hone, PA-C   2 months ago Hyperlipidemia associated with type 2 diabetes mellitus [facility])   [place] Adeline Hone, PA-C       Future Appointments             In 1 month Adeline Hone, PA-C [place] [facility]            Passed - CBC within normal limits and completed in the last 12 months    WBC  Date Value Ref Range Status  02/02/2024 9.5 3.8 - 10.8 Thousand/uL Final   RBC  Date Value Ref Range Status  02/02/2024 3.87 (L) 4.20 - 5.80 Million/uL Final   Hemoglobin  Date Value Ref Range Status  02/02/2024 11.2 (L) 13.2 - 17.1 g/dL Final   HCT  Date Value Ref Range Status  02/02/2024 34.5 (L) 38.5 - 50.0 % Final   MCHC  Date Value Ref Range Status  02/02/2024 32.5 32.0 - 36.0 g/dL Final    Comment:    For adults, a slight decrease in the calculated MCHC value (in the range of 30 to 32 g/dL) is most likely not clinically significant; however, it should be interpreted with caution in correlation with other red cell parameters and the patient's clinical condition.    [facility]  Date Value Ref Range Status  02/02/2024 28.9 27.0 - 33.0 pg Final   MCV  Date Value Ref Range Status  02/02/2024 89.1 80.0 - 100.0 fL Final   No results found for: "PLTCOUNTKUC", "LABPLAT", "POCPLA" RDW  Date Value Ref Range Status  02/02/2024 13.7 11.0 - 15.0 % Final          candesartan  (ATACAND ) 8 MG tablet 100 tablet 0    Sig: TAKE 1 TABLET BY MOUTH DAILY     Cardiovascular:  Angiotensin Receptor Blockers - candesartan  cilexetil Passed - 04/06/2024  3:22 PM      Passed - K in normal range and within 180 days    Potassium  Date Value Ref Range Status  11/03/2023 4.6 3.5 - 5.3 mmol/L Final         Passed - Cr in normal range and within 180 days    Creat  Date Value Ref Range Status  11/03/2023 1.03 0.70 - 1.30 mg/dL Final   Creatinine, POC  Date Value Ref Range Status  07/31/2016 NEG mg/dL Final   Creatinine, Urine  Date Value Ref Range Status  06/16/2023 235 20 - 320 mg/dL Final         Passed - AST in normal range and within 360 days     AST  Date Value Ref Range Status  11/03/2023 12 10 - 35 U/L Final         Passed - ALT in normal range and within 360 days    ALT  Date Value Ref Range Status  11/03/2023 16 9 - 46 U/L Final         Passed - Patient is not pregnant      Passed - Last BP in normal range    BP Readings from Last 1 Encounters:  02/16/24 122/70         Passed - Valid encounter within last 6 months    Recent Outpatient Visits           1 month ago Uncontrolled type 2 diabetes mellitus with hyperglycemia (HCC)   [place] [facility] Adeline Hone, [facility]-C   2 months ago Hyperlipidemia associated with type 2 diabetes mellitus [facility])   [place] [facility] Adeline Hone, PA-C       Future Appointments             In 1 month Adeline Hone, PA-C [facility], [facility]

## 2024-05-04 ENCOUNTER — Ambulatory Visit: Admitting: Family Medicine

## 2024-05-18 ENCOUNTER — Encounter: Payer: Self-pay | Admitting: Family Medicine

## 2024-05-18 ENCOUNTER — Ambulatory Visit: Admitting: Family Medicine

## 2024-05-18 VITALS — BP 122/68 | HR 100 | Temp 98.0°F | Resp 18 | Ht 71.0 in | Wt 214.1 lb

## 2024-05-18 DIAGNOSIS — E1165 Type 2 diabetes mellitus with hyperglycemia: Secondary | ICD-10-CM

## 2024-05-18 DIAGNOSIS — F17210 Nicotine dependence, cigarettes, uncomplicated: Secondary | ICD-10-CM

## 2024-05-18 DIAGNOSIS — E1169 Type 2 diabetes mellitus with other specified complication: Secondary | ICD-10-CM | POA: Diagnosis not present

## 2024-05-18 DIAGNOSIS — Z794 Long term (current) use of insulin: Secondary | ICD-10-CM | POA: Diagnosis not present

## 2024-05-18 DIAGNOSIS — D649 Anemia, unspecified: Secondary | ICD-10-CM | POA: Diagnosis not present

## 2024-05-18 DIAGNOSIS — I6523 Occlusion and stenosis of bilateral carotid arteries: Secondary | ICD-10-CM

## 2024-05-18 DIAGNOSIS — I739 Peripheral vascular disease, unspecified: Secondary | ICD-10-CM

## 2024-05-18 DIAGNOSIS — E785 Hyperlipidemia, unspecified: Secondary | ICD-10-CM | POA: Diagnosis not present

## 2024-05-18 DIAGNOSIS — I10 Essential (primary) hypertension: Secondary | ICD-10-CM | POA: Diagnosis not present

## 2024-05-18 DIAGNOSIS — F17200 Nicotine dependence, unspecified, uncomplicated: Secondary | ICD-10-CM | POA: Diagnosis not present

## 2024-05-18 DIAGNOSIS — Z7902 Long term (current) use of antithrombotics/antiplatelets: Secondary | ICD-10-CM

## 2024-05-18 NOTE — Assessment & Plan Note (Signed)
 A1C improving but not yet at goal, still uncontrolled even withgetting him back on ozempic  , currently on 1 mg dose, lantus  20 unit daily and metformin  full dose Discussed increasing ozempic  but he may not tolerate due to decreased appetite Could do slow lantus  dose titration if A1c is still not at goal He states cbgs are usually around 120, no lows Still due for DM eye exam DM foot exam done today He is still on ARB and statin  Repeat A1C today Lab Results  Component Value Date   HGBA1C 7.6 (H) 02/02/2024   HGBA1C 7.2 (H) 11/03/2023   HGBA1C 12.4 (H) 06/16/2023   HGBA1C 11.3 03/03/2023   HGBA1C 12.7 (H) 10/09/2022   HGBA1C 13.8 (H) 07/08/2022

## 2024-05-18 NOTE — Patient Instructions (Signed)
 Last Vascular appointment was in 2023 and they did want to recheck you again. This is from the vascular note:  Dew, Donald Frost, MD (Physician)             Carotid duplex today reveals velocities at least on the upper end of the 1 to 39% range on the right as well as the upper end of the 40 to 59% range on the left which is stable from his previous studies and CT scan on previous visits. Continue current medical regimen with aspirin , Plavix , and statin agent.  No role for intervention at this time.  Recheck in 6 months.

## 2024-05-18 NOTE — Assessment & Plan Note (Signed)
 On statin for HLD, labs recently done, ldl at goal Lab Results  Component Value Date   CHOL 95 11/03/2023   HDL 40 11/03/2023   LDLCALC 37 11/03/2023   TRIG 95 11/03/2023   CHOLHDL 2.4 11/03/2023  Continue crestor 40 mg and diet/lifestyle efforts

## 2024-05-18 NOTE — Assessment & Plan Note (Signed)
 Last A&P from Dr. Vonna Guardian 03/2022 and then he was lost to f/up Referred pt back to vascular specialists  Prior A&P Carotid duplex today reveals velocities at least on the upper end of the 1 to 39% range on the right as well as the upper end of the 40 to 59% range on the left which is stable from his previous studies and CT scan on previous visits. Continue current medical regimen with aspirin , Plavix , and statin agent.  No role for intervention at this time.  Recheck in 6 months.

## 2024-05-18 NOTE — Assessment & Plan Note (Signed)
 On candesartan  8 mg daily BP well controlled and at goal today with current meds and diet/lifestyle efforts BP Readings from Last 3 Encounters:  05/18/24 122/68  02/16/24 122/70  02/02/24 124/68  No med changes

## 2024-05-18 NOTE — Assessment & Plan Note (Signed)
 Offered smoking cessation resources and discussed med tx options Smoking cessation instruction/counseling given:  counseled patient on the dangers of tobacco use, advised patient to stop smoking, and reviewed strategies to maximize success Pt not interested in meds or resources today

## 2024-05-18 NOTE — Progress Notes (Signed)
 Name: Donald RESS Sr.   MRN: [id number]    DOB: 1966/05/24   Date:05/18/2024       Progress Note  Chief Complaint  Patient presents with   Medical Management of Chronic Issues    3 month follow-up   Diabetes   Hypertension     Subjective:   Donald D Welsch Sr. is a 58 y.o. male, presents to clinic for routine follow up on chronic conditions  Here for DM , HTN and HLD f/up He continues to follow with neurology and ophtho for HA's  On insulin  20 units daily, GLP with decreased appetite A1c was uncontrolled as of last OV Lab Results  Component Value Date   HGBA1C 7.6 (H) 02/02/2024   Lab Results  Component Value Date   CHOL 95 11/03/2023   HDL 40 11/03/2023   LDLCALC 37 11/03/2023   TRIG 95 11/03/2023   CHOLHDL 2.4 11/03/2023       Current Outpatient Medications:    aspirin  EC 81 MG EC tablet, Take 1 tablet (81 mg total) by mouth daily., Disp: , Rfl:    Blood Glucose Monitoring Suppl (D-CARE GLUCOMETER) w/Device KIT, 1 Units by Does not apply route 4 (four) times daily -  before meals and at bedtime., Disp: 1 kit, Rfl: 0   candesartan  (ATACAND ) 8 MG tablet, TAKE 1 TABLET BY MOUTH DAILY, Disp: 100 tablet, Rfl: 0   clopidogrel  (PLAVIX ) 75 MG tablet, TAKE 1 TABLET BY MOUTH DAILY, Disp: 90 tablet, Rfl: 0   insulin  glargine (LANTUS  SOLOSTAR) 100 UNIT/ML Solostar Pen, Inject 20-60 Units into the skin daily., Disp: 60 mL, Rfl: 1   Insulin  Pen Needle (BD PEN NEEDLE MICRO U/F) 32G X 6 MM MISC, USE FOR LEVEMIR  INJECTION ONCE TO TWICE DAILY, Disp: 200 each, Rfl: 1   metFORMIN  (GLUCOPHAGE ) 1000 MG tablet, TAKE 1 TABLET BY MOUTH TWICE  DAILY WITH MEALS, Disp: 200 tablet, Rfl: 0   rosuvastatin  (CRESTOR ) 40 MG tablet, TAKE 1 TABLET BY MOUTH DAILY, Disp: 100 tablet, Rfl: 1   Semaglutide , 1 MG/DOSE, (OZEMPIC , 1 MG/DOSE,) 4 MG/3ML SOPN, Inject 1 mg into the skin once a week., Disp: 9 mL, Rfl: 1   topiramate (TOPAMAX) 100 MG tablet, Take 100 mg by mouth 2 (two) times daily., Disp: , Rfl:    Patient Active Problem List   Diagnosis Date Noted   Intractable chronic migraine without aura and without status migrainosus 08/20/2022   Uncontrolled type 2 diabetes mellitus with hyperglycemia (HCC) 07/17/2022   Insulin  dependent type 2 diabetes mellitus (HCC) 07/17/2022   Anemia 04/07/2022   Carotid stenosis 01/17/2022   Chronic intractable headache 02/10/2020   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   Class 1 obesity with serious comorbidity and body mass index (BMI) of 30.0 to 30.9 in adult 05/27/2018   Angiodysplasia of stomach and duodenum    Current smoker 05/11/2018   Hypertension 09/15/2017   Hx of transient ischemic attack (TIA) 08/05/2017   Peripheral vascular insufficiency (HCC) 07/06/2017   Hyperlipidemia associated with type 2 diabetes mellitus (HCC) 10/31/2016    Past Surgical History:  Procedure Laterality Date   ESOPHAGOGASTRODUODENOSCOPY (EGD) WITH PROPOFOL  N/A 05/26/2018   Procedure: ESOPHAGOGASTRODUODENOSCOPY (EGD) WITH PROPOFOL ;  Surgeon: Irby Mannan, MD;  Location: [facility] ENDOSCOPY;  Service: Endoscopy;  Laterality: N/A;   KNEE SURGERY Right     Family History  Problem Relation Age of Onset   Hypertension Father    Diabetes Father    Liver disease Father    Diverticulitis Mother  Migraines Mother    Cancer Sister        tumors in the breast    Diabetes Maternal Grandfather    Cancer Sister        tumors in the breast    Cancer Sister        tumor in the breast     Social History   Tobacco Use   Smoking status: Every Day    Current packs/day: 1.00    Average packs/day: 1 pack/day for 36.5 years (36.5 ttl pk-yrs)    Types: Cigarettes    Start date: 12/02/1987   Smokeless tobacco: Never   Tobacco comments:    Late 20's started smoking, most years 1 ppd and some times he cuts back to 1/2 ppd  Vaping Use   Vaping status: Never Used  Substance Use Topics   Alcohol use: Not Currently    Alcohol/week: 3.0 standard drinks of alcohol     Types: 3 Cans of beer per week   Drug use: No     Allergies  Allergen Reactions   No Known Allergies     Health Maintenance  Topic Date Due   Medicare Annual Wellness (AWV)  Never done   OPHTHALMOLOGY EXAM  04/24/2023   FOOT EXAM  03/02/2024   COVID-19 Vaccine (3 - 2024-25 season) 06/03/2024 (Originally 08/02/2023)   Zoster Vaccines- Shingrix  (1 of 2) 08/18/2024 (Originally 06/13/2014)   DTaP/Tdap/Td (1 - Tdap) 11/02/2024 (Originally 06/14/1983)   INFLUENZA VACCINE  07/01/2024   HEMOGLOBIN A1C  08/04/2024   Diabetic kidney evaluation - eGFR measurement  11/02/2024   Colonoscopy  12/01/2024   Diabetic kidney evaluation - Urine ACR  02/20/2025   Pneumococcal Vaccine 40-39 Years old  Completed   Hepatitis C Screening  Completed   HIV Screening  Completed   HPV VACCINES  Aged Out   Meningococcal B Vaccine  Aged Out    Chart Review Today: I personally reviewed active problem list, medication list, allergies, family history, social history, health maintenance, notes from last encounter, lab results, imaging with the patient/caregiver today.   Review of Systems  Constitutional: Negative.   HENT: Negative.    Eyes: Negative.   Respiratory: Negative.    Cardiovascular: Negative.   Gastrointestinal: Negative.   Endocrine: Negative.   Genitourinary: Negative.   Musculoskeletal: Negative.   Skin: Negative.   Allergic/Immunologic: Negative.   Neurological: Negative.   Hematological: Negative.   Psychiatric/Behavioral: Negative.    All other systems reviewed and are negative.    Objective:   Vitals:   05/18/24 0900  BP: 122/68  Pulse: 100  Resp: 18  Temp: 98 F (36.7 C)  SpO2: 98%  Weight: 214 lb 1.6 oz (97.1 kg)  Height: 5' 11 (1.803 m)    Body mass index is 29.86 kg/m.  Physical Exam Vitals and nursing note reviewed.  Constitutional:      General: He is not in acute distress.    Appearance: Normal appearance. He is well-developed. He is not ill-appearing,  toxic-appearing or diaphoretic.  HENT:     Head: Normocephalic and atraumatic.     Right Ear: External ear normal.     Left Ear: External ear normal.     Nose: Nose normal.   Eyes:     General: No scleral icterus.       Right eye: No discharge.        Left eye: No discharge.     Conjunctiva/sclera: Conjunctivae normal.   Neck:  Trachea: No tracheal deviation.   Cardiovascular:     Rate and Rhythm: Normal rate.     Pulses: Normal pulses.     Heart sounds: Normal heart sounds.  Pulmonary:     Effort: Pulmonary effort is normal. No respiratory distress.     Breath sounds: Normal breath sounds. No stridor.   Skin:    General: Skin is warm and dry.     Findings: No rash.   Neurological:     Mental Status: He is alert.     Motor: No abnormal muscle tone.     Coordination: Coordination normal.     Gait: Gait normal.   Psychiatric:        Mood and Affect: Mood normal.        Behavior: Behavior normal.     Diabetic Foot Exam - Simple   Simple Foot Form Diabetic Foot exam was performed with the following findings: Yes 05/18/2024  9:13 AM  Visual Inspection No deformities, no ulcerations, no other skin breakdown bilaterally: Yes Sensation Testing Intact to touch and monofilament testing bilaterally: Yes Pulse Check Posterior Tibialis and Dorsalis pulse intact bilaterally: Yes Comments      Functional Status Survey:   Results for orders placed or performed in visit on 02/29/24  Lab report - scanned   Collection Time: 02/21/24 12:18 PM  Result Value Ref Range   Microalb Creat Ratio 29.999       Assessment & Plan:   Uncontrolled type 2 diabetes mellitus with hyperglycemia (HCC) Assessment & Plan: A1C improving but not yet at goal, still uncontrolled even withgetting him back on ozempic  , currently on 1 mg dose, lantus  20 unit daily and metformin  full dose Discussed increasing ozempic  but he may not tolerate due to decreased appetite Could do slow lantus  dose  titration if A1c is still not at goal He states cbgs are usually around 120, no lows Still due for DM eye exam DM foot exam done today He is still on ARB and statin  Repeat A1C today Lab Results  Component Value Date   HGBA1C 7.6 (H) 02/02/2024   HGBA1C 7.2 (H) 11/03/2023   HGBA1C 12.4 (H) 06/16/2023   HGBA1C 11.3 03/03/2023   HGBA1C 12.7 (H) 10/09/2022   HGBA1C 13.8 (H) 07/08/2022     Orders: -     Hemoglobin A1c -     Comprehensive metabolic panel with GFR -     HM Diabetes Foot Exam -     Ambulatory referral to Ophthalmology  Primary hypertension Assessment & Plan: On candesartan  8 mg daily BP well controlled and at goal today with current meds and diet/lifestyle efforts BP Readings from Last 3 Encounters:  05/18/24 122/68  02/16/24 122/70  02/02/24 124/68  No med changes  Orders: -     Comprehensive metabolic panel with GFR  Peripheral vascular insufficiency (HCC) Assessment & Plan: Managed on statin and plavix    Anemia, unspecified type Assessment & Plan: Recheck H/H and stool test done today to ensure no GI blood loss  Orders: -     CBC with Differential/Platelet -     Fecal Globin By Immunochemistry  Current smoker Assessment & Plan: Offered smoking cessation resources and discussed med tx options Smoking cessation instruction/counseling given:  counseled patient on the dangers of tobacco use, advised patient to stop smoking, and reviewed strategies to maximize success Pt not interested in meds or resources today   Orders: -     Ambulatory Referral for Lung Cancer Scre  Smokes less than 1 pack a day with greater than 20 pack year history -     Ambulatory Referral for Lung Cancer Scre  Encounter for current long term use of antiplatelet drug -     Comprehensive metabolic panel with GFR -     CBC with Differential/Platelet  Bilateral carotid artery stenosis Assessment & Plan: Last A&P from Dr. Vonna Guardian 03/2022 and then he was lost to f/up Referred  pt back to vascular specialists  Prior A&P Carotid duplex today reveals velocities at least on the upper end of the 1 to 39% range on the right as well as the upper end of the 40 to 59% range on the left which is stable from his previous studies and CT scan on previous visits. Continue current medical regimen with aspirin , Plavix , and statin agent.  No role for intervention at this time.  Recheck in 6 months.  Orders: -     Ambulatory referral to Vascular Surgery  Hyperlipidemia associated with type 2 diabetes mellitus (HCC) Assessment & Plan: On statin for HLD, labs recently done, ldl at goal Lab Results  Component Value Date   CHOL 95 11/03/2023   HDL 40 11/03/2023   LDLCALC 37 11/03/2023   TRIG 95 11/03/2023   CHOLHDL 2.4 11/03/2023  Continue crestor  40 mg and diet/lifestyle efforts  Orders: -     Comprehensive metabolic panel with GFR     Return for needs MWV, f/up pending A1c result (3 to 6 month?) .   Adeline Hone, PA-C 05/18/24 9:13 AM

## 2024-05-18 NOTE — Assessment & Plan Note (Signed)
 Managed on statin and plavix

## 2024-05-18 NOTE — Assessment & Plan Note (Signed)
 Recheck H/H and stool test done today to ensure no GI blood loss

## 2024-05-19 ENCOUNTER — Ambulatory Visit: Payer: Self-pay | Admitting: Family Medicine

## 2024-05-19 LAB — CBC WITH DIFFERENTIAL/PLATELET
Absolute Lymphocytes: 2033 {cells}/uL (ref 850–3900)
Absolute Monocytes: 637 {cells}/uL (ref 200–950)
Basophils Absolute: 57 {cells}/uL (ref 0–200)
Basophils Relative: 0.6 %
Eosinophils Absolute: 162 {cells}/uL (ref 15–500)
Eosinophils Relative: 1.7 %
HCT: 36.2 % — ABNORMAL LOW (ref 38.5–50.0)
Hemoglobin: 11.2 g/dL — ABNORMAL LOW (ref 13.2–17.1)
MCH: 29.6 pg (ref 27.0–33.0)
MCHC: 30.9 g/dL — ABNORMAL LOW (ref 32.0–36.0)
MCV: 95.5 fL (ref 80.0–100.0)
MPV: 10.8 fL (ref 7.5–12.5)
Monocytes Relative: 6.7 %
Neutro Abs: 6612 {cells}/uL (ref 1500–7800)
Neutrophils Relative %: 69.6 %
Platelets: 334 10*3/uL (ref 140–400)
RBC: 3.79 10*6/uL — ABNORMAL LOW (ref 4.20–5.80)
RDW: 15.1 % — ABNORMAL HIGH (ref 11.0–15.0)
Total Lymphocyte: 21.4 %
WBC: 9.5 10*3/uL (ref 3.8–10.8)

## 2024-05-19 LAB — COMPREHENSIVE METABOLIC PANEL WITH GFR
AG Ratio: 1.2 (calc) (ref 1.0–2.5)
ALT: 13 U/L (ref 9–46)
AST: 16 U/L (ref 10–35)
Albumin: 3.9 g/dL (ref 3.6–5.1)
Alkaline phosphatase (APISO): 69 U/L (ref 35–144)
BUN: 12 mg/dL (ref 7–25)
CO2: 24 mmol/L (ref 20–32)
Calcium: 9.4 mg/dL (ref 8.6–10.3)
Chloride: 106 mmol/L (ref 98–110)
Creat: 1.06 mg/dL (ref 0.70–1.30)
Globulin: 3.3 g/dL (ref 1.9–3.7)
Glucose, Bld: 136 mg/dL — ABNORMAL HIGH (ref 65–99)
Potassium: 4.2 mmol/L (ref 3.5–5.3)
Sodium: 139 mmol/L (ref 135–146)
Total Bilirubin: 0.2 mg/dL (ref 0.2–1.2)
Total Protein: 7.2 g/dL (ref 6.1–8.1)
eGFR: 81 mL/min/{1.73_m2} (ref >=60)

## 2024-05-19 LAB — HEMOGLOBIN A1C
Hgb A1c MFr Bld: 6.9 % — ABNORMAL HIGH (ref <5.7)
Mean Plasma Glucose: 151 mg/dL
eAG (mmol/L): 8.4 mmol/L

## 2024-05-24 ENCOUNTER — Other Ambulatory Visit: Payer: Self-pay | Admitting: Family Medicine

## 2024-05-24 DIAGNOSIS — E1165 Type 2 diabetes mellitus with hyperglycemia: Secondary | ICD-10-CM

## 2024-05-24 DIAGNOSIS — E119 Type 2 diabetes mellitus without complications: Secondary | ICD-10-CM

## 2024-05-25 NOTE — Telephone Encounter (Signed)
 Requested medication (s) are due for refill today: na  Requested medication (s) are on the active medication list: yes  Last refill:  02/02/24 #60 ml 1 refills  Future visit scheduled: no  Notes to clinic:    To pharmacy: Please send a replace/new response with 100-Day Supply if appropriate to maximize member benefit. Requesting 1 year supply.   Do you want to refill for 1 year supply?     Requested Prescriptions  Pending Prescriptions Disp Refills   LANTUS  SOLOSTAR 100 UNIT/ML Solostar Pen [Pharmacy Med Name: Lantus  SoloStar 100 UNIT/ML Subcutaneous Solution Pen-injector] 60 mL 2    Sig: INJECT 20 TO 60 UNITS INTO THE  SKIN DAILY     Endocrinology:  Diabetes - Insulins Passed - 05/25/2024  4:06 PM      Passed - HBA1C is between 0 and 7.9 and within 180 days    Hemoglobin A1C  Date Value Ref Range Status  03/03/2023 11.3  Final   Hgb A1c MFr Bld  Date Value Ref Range Status  05/18/2024 6.9 (H) <5.7 % Final    Comment:    For someone without known diabetes, a hemoglobin A1c value of 6.5% or greater indicates that they may have  diabetes and this should be confirmed with a follow-up  test. . For someone with known diabetes, a value <7% indicates  that their diabetes is well controlled and a value  greater than or equal to 7% indicates suboptimal  control. A1c targets should be individualized based on  duration of diabetes, age, comorbid conditions, and  other considerations. . Currently, no consensus exists regarding use of hemoglobin A1c for diagnosis of diabetes for children. SABRA Amy - Valid encounter within last 6 months    Recent Outpatient Visits           1 week ago Uncontrolled type 2 diabetes mellitus with hyperglycemia [facility])   [place] [facility] Leavy Mole, PA-C   3 months ago Uncontrolled type 2 diabetes mellitus with hyperglycemia [facility])   [place] [facility] Leavy Mole, PA-C   3 months ago  Hyperlipidemia associated with type 2 diabetes mellitus [facility])   [facility] Leavy Mole, PA-C

## 2024-06-06 ENCOUNTER — Other Ambulatory Visit: Payer: Self-pay | Admitting: Family Medicine

## 2024-06-09 ENCOUNTER — Telehealth: Payer: Self-pay | Admitting: Acute Care

## 2024-06-09 DIAGNOSIS — Z87891 Personal history of nicotine dependence: Secondary | ICD-10-CM

## 2024-06-09 DIAGNOSIS — Z122 Encounter for screening for malignant neoplasm of respiratory organs: Secondary | ICD-10-CM

## 2024-06-09 DIAGNOSIS — F17210 Nicotine dependence, cigarettes, uncomplicated: Secondary | ICD-10-CM

## 2024-06-09 NOTE — Telephone Encounter (Signed)
 Requested Prescriptions  Pending Prescriptions Disp Refills   clopidogrel  (PLAVIX ) 75 MG tablet [Pharmacy Med Name: Clopidogrel  Bisulfate 75 MG Oral Tablet] 90 tablet 0    Sig: TAKE 1 TABLET BY MOUTH DAILY     Hematology: Antiplatelets - clopidogrel  Failed - 06/09/2024  9:53 AM      Failed - HCT in normal range and within 180 days    HCT  Date Value Ref Range Status  05/18/2024 36.2 (L) 38.5 - 50.0 % Final         Failed - HGB in normal range and within 180 days    Hemoglobin  Date Value Ref Range Status  05/18/2024 11.2 (L) 13.2 - 17.1 g/dL Final         Passed - PLT in normal range and within 180 days    Platelets  Date Value Ref Range Status  05/18/2024 334 140 - 400 Thousand/uL Final         Passed - Cr in normal range and within 360 days    Creat  Date Value Ref Range Status  05/18/2024 1.06 0.70 - 1.30 mg/dL Final   Creatinine, POC  Date Value Ref Range Status  07/31/2016 NEG mg/dL Final   Creatinine, Urine  Date Value Ref Range Status  06/16/2023 235 20 - 320 mg/dL Final         Passed - Valid encounter within last 6 months    Recent Outpatient Visits           3 weeks ago Uncontrolled type 2 diabetes mellitus with hyperglycemia [facility])   [facility] Leavy Mole, PA-C   3 months ago Uncontrolled type 2 diabetes mellitus with hyperglycemia [facility])    [facility] Leavy Mole, PA-C   4 months ago Hyperlipidemia associated with type 2 diabetes mellitus [facility])   [facility] Leavy Mole, PA-C

## 2024-06-09 NOTE — Telephone Encounter (Signed)
 Lung Cancer Screening Narrative/Criteria Questionnaire (Cigarette Smokers Only- No Cigars/Pipes/vapes)   Donald D Clock Sr.   SDMV:06/13/24 at 10am/KATY                                           07/25/1966               LDCT: 06/16/24 at 1030a/[facility]    58 y.o.   Phone: [phone]  Lung Screening Narrative (confirm age 69-77 yrs Medicare / 50-80 yrs Private pay insurance)   Insurance information:[employer]   Referring Provider:[facility]   This screening involves an initial phone call with a team member from our program. It is called a shared decision making visit. The initial meeting is required by insurance and Medicare to make sure you understand the program. This appointment takes about 15-20 minutes to complete. The CT scan will completed at a separate date/time. This scan takes about 5-10 minutes to complete and you may eat and drink before and after the scan.  Criteria questions for Lung Cancer Screening:   Are you a current or former smoker? Current Age began smoking: 25 yrs   If you are a former smoker, what year did you quit smoking? NA   To calculate your smoking history, I need an accurate estimate of how many packs of cigarettes you smoked per day and for how many years. (Not just the number of PPD you are now smoking)   Years smoking 32 x Packs per day 1 = Pack years 32   (at least 20 pack yrs)   (Make sure they understand that we need to know how much they have smoked in the past, not just the number of PPD they are smoking now)  Do you have a personal history of cancer?  No    Do you have a family history of cancer? No  Are you coughing up blood?  No  Have you had unexplained weight loss of 15 lbs or more in the last 6 months? No  It looks like you meet all criteria.     Additional information: N/A

## 2024-06-13 ENCOUNTER — Encounter: Payer: Self-pay | Admitting: Adult Health

## 2024-06-13 ENCOUNTER — Ambulatory Visit: Admitting: Adult Health

## 2024-06-13 DIAGNOSIS — F17210 Nicotine dependence, cigarettes, uncomplicated: Secondary | ICD-10-CM

## 2024-06-13 NOTE — Patient Instructions (Signed)

## 2024-06-13 NOTE — Progress Notes (Signed)
  Virtual Visit via Telephone Note  I connected with Shadman D Hopkinson Sr. , 06/13/24 10:00 AM by a telemedicine application and verified that I am speaking with the correct person using two identifiers.  Location: Patient: home Provider: home   I discussed the limitations of evaluation and management by telemedicine and the availability of in person appointments. The patient expressed understanding and agreed to proceed.   Shared Decision Making Visit Lung Cancer Screening Program [phone])   Eligibility: 58 y.o. Pack Years Smoking History Calculation =32 pack years  (# packs/per year x # years smoked) Recent History of coughing up blood  no Unexplained weight loss? no ( >Than 15 pounds within the last 6 months ) Prior History Lung / other cancer no (Diagnosis within the last 5 years already requiring surveillance chest CT Scans). Smoking Status Current Smoker   Visit Components: Discussion included one or more decision making aids. YES Discussion included risk/benefits of screening. YES Discussion included potential follow up diagnostic testing for abnormal scans. YES Discussion included meaning and risk of over diagnosis. YES Discussion included meaning and risk of False Positives. YES Discussion included meaning of total radiation exposure. YES  Counseling Included: Importance of adherence to annual lung cancer LDCT screening. YES Impact of comorbidities on ability to participate in the program. YES Ability and willingness to under diagnostic treatment. YES  Smoking Cessation Counseling: Current Smokers:  Discussed importance of smoking cessation. yes Information about tobacco cessation classes and interventions provided to patient. yes Patient provided with ticket for LDCT Scan. yes Symptomatic Patient. NO Diagnosis Code: Tobacco Use Z72.0 Asymptomatic Patient yes  Counseling - 4 minutes of smoking cessation counseling (CT Chest Lung Cancer Screening Low Dose W/O  CM) [id number]  Z12.2-Screening of respiratory organs Z87.891-Personal history of nicotine  dependence   Lamarr Myers 06/13/24

## 2024-06-15 ENCOUNTER — Other Ambulatory Visit: Payer: Self-pay | Admitting: Family Medicine

## 2024-06-15 DIAGNOSIS — Z794 Long term (current) use of insulin: Secondary | ICD-10-CM

## 2024-06-16 ENCOUNTER — Ambulatory Visit
Admission: RE | Admit: 2024-06-16 | Discharge: 2024-06-16 | Disposition: A | Discharge status: Home / Self Care | Source: Ambulatory Visit | Attending: Acute Care | Admitting: Acute Care

## 2024-06-16 DIAGNOSIS — F17210 Nicotine dependence, cigarettes, uncomplicated: Secondary | ICD-10-CM | POA: Insufficient documentation

## 2024-06-16 DIAGNOSIS — Z122 Encounter for screening for malignant neoplasm of respiratory organs: Secondary | ICD-10-CM | POA: Insufficient documentation

## 2024-06-16 DIAGNOSIS — Z87891 Personal history of nicotine dependence: Secondary | ICD-10-CM | POA: Insufficient documentation

## 2024-06-17 NOTE — Telephone Encounter (Signed)
 Requested Prescriptions  Pending Prescriptions Disp Refills   metFORMIN  (GLUCOPHAGE ) 1000 MG tablet [Pharmacy Med Name: metFORMIN  HCl 1000 MG Oral Tablet] 200 tablet 1    Sig: TAKE 1 TABLET BY MOUTH TWICE  DAILY WITH MEALS     Endocrinology:  Diabetes - Biguanides Failed - 06/17/2024 10:49 AM      Failed - B12 Level in normal range and within 720 days    No results found for: VITAMINB12       Passed - Cr in normal range and within 360 days    Creat  Date Value Ref Range Status  05/18/2024 1.06 0.70 - 1.30 mg/dL Final   Creatinine, POC  Date Value Ref Range Status  07/31/2016 NEG mg/dL Final   Creatinine, Urine  Date Value Ref Range Status  06/16/2023 235 20 - 320 mg/dL Final         Passed - HBA1C is between 0 and 7.9 and within 180 days    Hemoglobin A1C  Date Value Ref Range Status  03/03/2023 11.3  Final   Hgb A1c MFr Bld  Date Value Ref Range Status  05/18/2024 6.9 (H) <5.7 % Final    Comment:    For someone without known diabetes, a hemoglobin A1c value of 6.5% or greater indicates that they may have  diabetes and this should be confirmed with a follow-up  test. . For someone with known diabetes, a value <7% indicates  that their diabetes is well controlled and a value  greater than or equal to 7% indicates suboptimal  control. A1c targets should be individualized based on  duration of diabetes, age, comorbid conditions, and  other considerations. . Currently, no consensus exists regarding use of hemoglobin A1c for diagnosis of diabetes for children. .          Passed - eGFR in normal range and within 360 days    GFR, Est African American  Date Value Ref Range Status  03/26/2021 76 > OR = 60 mL/min/1.63m2 Final   GFR, Est Non African American  Date Value Ref Range Status  03/26/2021 65 > OR = 60 mL/min/1.63m2 Final   eGFR  Date Value Ref Range Status  05/18/2024 81 > OR = 60 mL/min/1.51m2 Final         Passed - Valid encounter within last 6  months    Recent Outpatient Visits           1 month ago Uncontrolled type 2 diabetes mellitus with hyperglycemia [facility])   [place] [facility] Leavy Mole, PA-C   4 months ago Uncontrolled type 2 diabetes mellitus with hyperglycemia [facility])   [place] [facility] Leavy Mole, PA-C   4 months ago Hyperlipidemia associated with type 2 diabetes mellitus [facility])   [place] [facility] [place], [place], PA-C              Passed - CBC within normal limits and completed in the last 12 months    WBC  Date Value Ref Range Status  05/18/2024 9.5 3.8 - 10.8 Thousand/uL Final   RBC  Date Value Ref Range Status  05/18/2024 3.79 (L) 4.20 - 5.80 Million/uL Final   Hemoglobin  Date Value Ref Range Status  05/18/2024 11.2 (L) 13.2 - 17.1 g/dL Final   HCT  Date Value Ref Range Status  05/18/2024 36.2 (L) 38.5 - 50.0 % Final   MCHC  Date Value Ref Range Status  05/18/2024 30.9 (L) 32.0 - 36.0 g/dL Final  Comment:    For adults, a slight decrease in the calculated MCHC value (in the range of 30 to 32 g/dL) is most likely not clinically significant; however, it should be interpreted with caution in correlation with other red cell parameters and the patient's clinical condition.    [facility]  Date Value Ref Range Status  05/18/2024 29.6 27.0 - 33.0 pg Final   MCV  Date Value Ref Range Status  05/18/2024 95.5 80.0 - 100.0 fL Final   No results found for: PLTCOUNTKUC, LABPLAT, POCPLA RDW  Date Value Ref Range Status  05/18/2024 15.1 (H) 11.0 - 15.0 % Final          candesartan  (ATACAND ) 8 MG tablet [Pharmacy Med Name: CANDESARTAN   8MG   TAB] 100 tablet 1    Sig: TAKE 1 TABLET BY MOUTH DAILY     Cardiovascular:  Angiotensin Receptor Blockers - candesartan  cilexetil Passed - 06/17/2024 10:49 AM      Passed - K in normal range and within 180 days    Potassium  Date Value Ref Range Status  05/18/2024 4.2 3.5 - 5.3 mmol/L Final          Passed - Cr in normal range and within 180 days    Creat  Date Value Ref Range Status  05/18/2024 1.06 0.70 - 1.30 mg/dL Final   Creatinine, POC  Date Value Ref Range Status  07/31/2016 NEG mg/dL Final   Creatinine, Urine  Date Value Ref Range Status  06/16/2023 235 20 - 320 mg/dL Final         Passed - AST in normal range and within 360 days    AST  Date Value Ref Range Status  05/18/2024 16 10 - 35 U/L Final         Passed - ALT in normal range and within 360 days    ALT  Date Value Ref Range Status  05/18/2024 13 9 - 46 U/L Final         Passed - Patient is not pregnant      Passed - Last BP in normal range    BP Readings from Last 1 Encounters:  05/18/24 122/68         Passed - Valid encounter within last 6 months    Recent Outpatient Visits           1 month ago Uncontrolled type 2 diabetes mellitus with hyperglycemia [facility])   [place] [facility] Leavy Mole, PA-C   4 months ago Uncontrolled type 2 diabetes mellitus with hyperglycemia [facility])   [place] [facility] Leavy Mole, PA-C   4 months ago Hyperlipidemia associated with type 2 diabetes mellitus [facility])   [facility] Leavy Mole, PA-C

## 2024-06-27 ENCOUNTER — Other Ambulatory Visit: Payer: Self-pay

## 2024-06-27 ENCOUNTER — Other Ambulatory Visit: Payer: Self-pay | Admitting: Family Medicine

## 2024-06-27 DIAGNOSIS — Z122 Encounter for screening for malignant neoplasm of respiratory organs: Secondary | ICD-10-CM

## 2024-06-27 DIAGNOSIS — F17210 Nicotine dependence, cigarettes, uncomplicated: Secondary | ICD-10-CM

## 2024-06-27 DIAGNOSIS — Z87891 Personal history of nicotine dependence: Secondary | ICD-10-CM

## 2024-06-27 DIAGNOSIS — Z794 Long term (current) use of insulin: Secondary | ICD-10-CM

## 2024-06-28 NOTE — Telephone Encounter (Signed)
 Requested Prescriptions  Pending Prescriptions Disp Refills   BD PEN NEEDLE MICRO ULTRAFINE 32G X 6 MM MISC [Pharmacy Med Name: [id number]] 200 each 1    Sig: USE FOR INJECTION 1 TO 2 TIMES  DAILY     Endocrinology: Diabetes - Testing Supplies Passed - 06/28/2024  2:42 PM      Passed - Valid encounter within last 12 months    Recent Outpatient Visits           1 month ago Uncontrolled type 2 diabetes mellitus with hyperglycemia [facility])   [facility] Leavy Mole, PA-C   4 months ago Uncontrolled type 2 diabetes mellitus with hyperglycemia [facility])   [place] [facility] Leavy Mole, PA-C   4 months ago Hyperlipidemia associated with type 2 diabetes mellitus [facility])   [facility] Leavy Mole, PA-C

## 2024-07-06 LAB — HM DIABETES EYE EXAM

## 2024-07-21 ENCOUNTER — Other Ambulatory Visit (INDEPENDENT_AMBULATORY_CARE_PROVIDER_SITE_OTHER): Payer: Self-pay | Admitting: Vascular Surgery

## 2024-07-21 DIAGNOSIS — I6523 Occlusion and stenosis of bilateral carotid arteries: Secondary | ICD-10-CM

## 2024-08-02 ENCOUNTER — Ambulatory Visit (INDEPENDENT_AMBULATORY_CARE_PROVIDER_SITE_OTHER): Admitting: Vascular Surgery

## 2024-08-02 ENCOUNTER — Encounter (INDEPENDENT_AMBULATORY_CARE_PROVIDER_SITE_OTHER): Payer: Self-pay | Admitting: Vascular Surgery

## 2024-08-02 ENCOUNTER — Ambulatory Visit (INDEPENDENT_AMBULATORY_CARE_PROVIDER_SITE_OTHER)

## 2024-08-02 VITALS — BP 135/81 | HR 94 | Ht 71.0 in | Wt 212.0 lb

## 2024-08-02 DIAGNOSIS — I6523 Occlusion and stenosis of bilateral carotid arteries: Secondary | ICD-10-CM | POA: Diagnosis not present

## 2024-08-02 DIAGNOSIS — E785 Hyperlipidemia, unspecified: Secondary | ICD-10-CM

## 2024-08-02 DIAGNOSIS — E1165 Type 2 diabetes mellitus with hyperglycemia: Secondary | ICD-10-CM

## 2024-08-02 DIAGNOSIS — I10 Essential (primary) hypertension: Secondary | ICD-10-CM | POA: Diagnosis not present

## 2024-08-02 DIAGNOSIS — E1169 Type 2 diabetes mellitus with other specified complication: Secondary | ICD-10-CM

## 2024-08-02 NOTE — Assessment & Plan Note (Signed)
 Carotid duplex today demonstrates stable stenosis in the 1-39 range on the right and in the upper end of the 40 to 59% range on the left which is unchanged from his previous CT scan and duplex studies. He will continue his aspirin , Plavix , and Crestor .  No role for intervention at this level.  We will continue to follow this on an annual basis with duplex going forward.

## 2024-08-02 NOTE — Progress Notes (Signed)
 Patient ID: Donald REIER Sr., male   DOB: January 04, 1966, 58 y.o.   MRN: [id number]  Chief Complaint  Patient presents with   Follow-up    HPI Donald Mcewen Konicek Sr. is a 58 y.o. male.  I am asked to see the patient by Raynard Cower for evaluation of his carotid disease.  He is known to our practice but had not been seen in a little over 2 years for his carotid disease.  He had many other ongoing issues and was lost to follow-up for some time, but his primary care provider astutely noted that he had not had a carotid duplex since April 2023.  Patient reports he is doing well.  Since he started the Plavix  he has had no further headaches has been very pleased with this.  He has had no focal neurologic symptoms since his last visit. Specifically, the patient denies amaurosis fugax, speech or swallowing difficulties, or arm or leg weakness or numbness.  Carotid duplex today demonstrates stable stenosis in the 1-39 range on the right and in the upper end of the 40 to 59% range on the left which is unchanged from his previous CT scan and duplex studies.   Past Medical History:  Diagnosis Date   Abdominal pain, epigastric    Diabetes mellitus without complication (HCC)    Gout    R foot great toe    Hypercholesteremia 2017   Hypertension     Past Surgical History:  Procedure Laterality Date   ESOPHAGOGASTRODUODENOSCOPY (EGD) WITH PROPOFOL  N/A 05/26/2018   Procedure: ESOPHAGOGASTRODUODENOSCOPY (EGD) WITH PROPOFOL ;  Surgeon: Janalyn Keene NOVAK, MD;  Location: [facility] ENDOSCOPY;  Service: Endoscopy;  Laterality: N/A;   KNEE SURGERY Right      Family History  Problem Relation Age of Onset   Hypertension Father    Diabetes Father    Liver disease Father    Diverticulitis Mother    Migraines Mother    Cancer Sister        tumors in the breast    Diabetes Maternal Grandfather    Cancer Sister        tumors in the breast    Cancer Sister        tumor in the breast       Social History    Tobacco Use   Smoking status: Every Day    Current packs/day: 1.00    Average packs/day: 1 pack/day for 36.7 years (36.7 ttl pk-yrs)    Types: Cigarettes    Start date: 12/02/1987   Smokeless tobacco: Never   Tobacco comments:    Late 20's started smoking, most years 1 ppd and some times he cuts back to 1/2 ppd  Vaping Use   Vaping status: Never Used  Substance Use Topics   Alcohol use: Not Currently    Alcohol/week: 3.0 standard drinks of alcohol    Types: 3 Cans of beer per week   Drug use: No     Allergies  Allergen Reactions   No Known Allergies     Current Outpatient Medications  Medication Sig Dispense Refill   aspirin  EC 81 MG EC tablet Take 1 tablet (81 mg total) by mouth daily.     BD PEN NEEDLE MICRO ULTRAFINE 32G X 6 MM MISC USE FOR INJECTION 1 TO 2 TIMES  DAILY 200 each 1   Blood Glucose Monitoring Suppl (D-CARE GLUCOMETER) w/Device KIT 1 Units by Does not apply route 4 (four) times daily -  before meals  and at bedtime. 1 kit 0   candesartan  (ATACAND ) 8 MG tablet TAKE 1 TABLET BY MOUTH DAILY 100 tablet 1   clopidogrel  (PLAVIX ) 75 MG tablet TAKE 1 TABLET BY MOUTH DAILY 100 tablet 1   LANTUS  SOLOSTAR 100 UNIT/ML Solostar Pen INJECT 20 TO 60 UNITS INTO THE  SKIN DAILY 60 mL 2   metFORMIN  (GLUCOPHAGE ) 1000 MG tablet TAKE 1 TABLET BY MOUTH TWICE  DAILY WITH MEALS 200 tablet 1   rosuvastatin  (CRESTOR ) 40 MG tablet TAKE 1 TABLET BY MOUTH DAILY 100 tablet 1   Semaglutide , 1 MG/DOSE, (OZEMPIC , 1 MG/DOSE,) 4 MG/3ML SOPN Inject 1 mg into the skin once a week. 9 mL 1   topiramate (TOPAMAX) 100 MG tablet Take 100 mg by mouth 2 (two) times daily.     No current facility-administered medications for this visit.      REVIEW OF SYSTEMS (Negative unless checked)   Constitutional: [] Weight loss  [] Fever  [] Chills Cardiac: [] Chest pain   [] Chest pressure   [] Palpitations   [] Shortness of breath when laying flat   [] Shortness of breath at rest   [] Shortness of breath with  exertion. Vascular:  [] Pain in legs with walking   [] Pain in legs at rest   [] Pain in legs when laying flat   [] Claudication   [] Pain in feet when walking  [] Pain in feet at rest  [] Pain in feet when laying flat   [] History of DVT   [] Phlebitis   [] Swelling in legs   [] Varicose veins   [] Non-healing ulcers Pulmonary:   [] Uses home oxygen   [] Productive cough   [] Hemoptysis   [] Wheeze  [] COPD   [] Asthma Neurologic:  [] Dizziness  [x] Blackouts   [] Seizures   [] History of stroke   [] History of TIA  [] Aphasia   [] Temporary blindness   [] Dysphagia   [] Weakness or numbness in arms   [] Weakness or numbness in legs Musculoskeletal:  [x] Arthritis   [] Joint swelling   [x] Joint pain   [] Low back pain Hematologic:  [] Easy bruising  [] Easy bleeding   [] Hypercoagulable state   [] Anemic  [] Hepatitis Gastrointestinal:  [] Blood in stool   [] Vomiting blood  [] Gastroesophageal reflux/heartburn   [] Abdominal pain Genitourinary:  [] Chronic kidney disease   [] Difficult urination  [] Frequent urination  [] Burning with urination   [] Hematuria Skin:  [] Rashes   [] Ulcers   [] Wounds Psychological:  [] History of anxiety   []  History of major depression.    Physical Exam BP 135/81   Pulse 94   Ht 5' 11 (1.803 m)   Wt 212 lb (96.2 kg)   BMI 29.57 kg/m  Gen:  WD/WN, NAD Head: [place]/AT, No temporalis wasting. Ear/Nose/Throat: Hearing grossly intact, nares w/o erythema or drainage, oropharynx w/o Erythema/Exudate Eyes: Conjunctiva clear, sclera non-icteric  Neck: trachea midline.  No JVD.  Pulmonary:  Good air movement, respirations not labored, no use of accessory muscles  Cardiac: RRR, no JVD Vascular:  Vessel Right Left  Radial Palpable Palpable                                   Gastrointestinal:. No masses, surgical incisions, or scars. Musculoskeletal: M/S 5/5 throughout.  Extremities without ischemic changes.  No deformity or atrophy. No edema. Neurologic: Sensation grossly intact in extremities.   Symmetrical.  Speech is fluent. Motor exam as listed above. Psychiatric: Judgment intact, Mood & affect appropriate for pt's clinical situation. Dermatologic: No rashes or ulcers noted.  No  cellulitis or open wounds.    Radiology No results found.  Labs Recent Results (from the past 2160 hours)  HgB A1c     Status: Abnormal   Collection Time: 05/18/24  9:34 AM  Result Value Ref Range   Hgb A1c MFr Bld 6.9 (H) <5.7 %    Comment: For someone without known diabetes, a hemoglobin A1c value of 6.5% or greater indicates that they may have  diabetes and this should be confirmed with a follow-up  test. . For someone with known diabetes, a value <7% indicates  that their diabetes is well controlled and a value  greater than or equal to 7% indicates suboptimal  control. A1c targets should be individualized based on  duration of diabetes, age, comorbid conditions, and  other considerations. . Currently, no consensus exists regarding use of hemoglobin A1c for diagnosis of diabetes for children. .    Mean Plasma Glucose 151 mg/dL   eAG (mmol/L) 8.4 mmol/L  Comprehensive Metabolic Panel (CMET)     Status: Abnormal   Collection Time: 05/18/24  9:34 AM  Result Value Ref Range   Glucose, Bld 136 (H) 65 - 99 mg/dL    Comment: .            Fasting reference interval . For someone without known diabetes, a glucose value >125 mg/dL indicates that they may have diabetes and this should be confirmed with a follow-up test. .    BUN 12 7 - 25 mg/dL   Creat 8.93 9.29 - 8.69 mg/dL   eGFR 81 > OR = 60 fO/fpw/8.26f7   BUN/Creatinine Ratio SEE NOTE: 6 - 22 (calc)    Comment:    Not Reported: BUN and Creatinine are within    reference range. .    Sodium 139 135 - 146 mmol/L   Potassium 4.2 3.5 - 5.3 mmol/L   Chloride 106 98 - 110 mmol/L   CO2 24 20 - 32 mmol/L   Calcium  9.4 8.6 - 10.3 mg/dL   Total Protein 7.2 6.1 - 8.1 g/dL   Albumin 3.9 3.6 - 5.1 g/dL   Globulin 3.3 1.9 - 3.7 g/dL  (calc)   AG Ratio 1.2 1.0 - 2.5 (calc)   Total Bilirubin 0.2 0.2 - 1.2 mg/dL   Alkaline phosphatase (APISO) 69 35 - 144 U/L   AST 16 10 - 35 U/L   ALT 13 9 - 46 U/L  CBC with Differential/Platelet     Status: Abnormal   Collection Time: 05/18/24  9:34 AM  Result Value Ref Range   WBC 9.5 3.8 - 10.8 Thousand/uL   RBC 3.79 (L) 4.20 - 5.80 Million/uL   Hemoglobin 11.2 (L) 13.2 - 17.1 g/dL   HCT 63.7 (L) 61.4 - 49.9 %   MCV 95.5 80.0 - 100.0 fL   MCH 29.6 27.0 - 33.0 pg   MCHC 30.9 (L) 32.0 - 36.0 g/dL    Comment: For adults, a slight decrease in the calculated MCHC value (in the range of 30 to 32 g/dL) is most likely not clinically significant; however, it should be interpreted with caution in correlation with other red cell parameters and the patient's clinical condition.    RDW 15.1 (H) 11.0 - 15.0 %   Platelets 334 140 - 400 Thousand/uL   MPV 10.8 7.5 - 12.5 fL   Neutro Abs 6,612 1,500 - 7,800 cells/uL   Absolute Lymphocytes 2,033 850 - 3,900 cells/uL   Absolute Monocytes 637 200 - 950 cells/uL   Eosinophils  Absolute 162 15 - 500 cells/uL   Basophils Absolute 57 0 - 200 cells/uL   Neutrophils Relative % 69.6 %   Total Lymphocyte 21.4 %   Monocytes Relative 6.7 %   Eosinophils Relative 1.7 %   Basophils Relative 0.6 %    Assessment/Plan:  Carotid stenosis Carotid duplex today demonstrates stable stenosis in the 1-39 range on the right and in the upper end of the 40 to 59% range on the left which is unchanged from his previous CT scan and duplex studies. He will continue his aspirin , Plavix , and Crestor .  No role for intervention at this level.  We will continue to follow this on an annual basis with duplex going forward.  Hypertension blood pressure control important in reducing the progression of atherosclerotic disease. On appropriate oral medications.     Diabetes mellitus type 2 in obese (HCC) blood glucose control important in reducing the progression of  atherosclerotic disease. Also, involved in wound healing. On appropriate medications.     Hyperlipidemia associated with type 2 diabetes mellitus (HCC) lipid control important in reducing the progression of atherosclerotic disease. Continue statin therapy    Selinda Gu 08/02/2024, 3:46 PM   This note was created with Dragon medical transcription system.  Any errors from dictation are unintentional.

## 2024-09-18 ENCOUNTER — Other Ambulatory Visit: Payer: Self-pay | Admitting: Family Medicine

## 2024-09-18 DIAGNOSIS — E1169 Type 2 diabetes mellitus with other specified complication: Secondary | ICD-10-CM

## 2024-09-18 DIAGNOSIS — E669 Obesity, unspecified: Secondary | ICD-10-CM

## 2024-09-20 NOTE — Telephone Encounter (Signed)
 Requested Prescriptions  Pending Prescriptions Disp Refills   rosuvastatin  (CRESTOR ) 40 MG tablet [Pharmacy Med Name: Rosuvastatin  Calcium  40 MG Oral Tablet] 100 tablet 0    Sig: TAKE 1 TABLET BY MOUTH DAILY     Cardiovascular:  Antilipid - Statins 2 Failed - 09/20/2024  2:20 PM      Failed - Lipid Panel in normal range within the last 12 months    Cholesterol  Date Value Ref Range Status  11/03/2023 95 <200 mg/dL Final   LDL Cholesterol (Calc)  Date Value Ref Range Status  11/03/2023 37 mg/dL (calc) Final    Comment:    Reference range: <100 . Desirable range <100 mg/dL for primary prevention;   <70 mg/dL for patients with CHD or diabetic patients  with > or = 2 CHD risk factors. SABRA LDL-C is now calculated using the [name]-Hopkins  calculation, which is a validated novel method providing  better accuracy than the Friedewald equation in the  estimation of LDL-C.  Gladis APPLETHWAITE et al. SANDREA. 7986;689(80): 2061-2068  (http://education.[employer].com/faq/FAQ164)    HDL  Date Value Ref Range Status  11/03/2023 40 > OR = 40 mg/dL Final   Triglycerides  Date Value Ref Range Status  11/03/2023 95 <150 mg/dL Final         Passed - Cr in normal range and within 360 days    Creat  Date Value Ref Range Status  05/18/2024 1.06 0.70 - 1.30 mg/dL Final   Creatinine, POC  Date Value Ref Range Status  07/31/2016 NEG mg/dL Final   Creatinine, Urine  Date Value Ref Range Status  06/16/2023 235 20 - 320 mg/dL Final         Passed - Patient is not pregnant      Passed - Valid encounter within last 12 months    Recent Outpatient Visits           4 months ago Uncontrolled type 2 diabetes mellitus with hyperglycemia [facility])   [place] [facility] Leavy Mole, PA-C   7 months ago Uncontrolled type 2 diabetes mellitus with hyperglycemia [facility])   [place] [facility] Leavy Mole, [facility]-C   7 months ago Hyperlipidemia associated with type 2  diabetes mellitus [facility])   [facility] Leavy Mole, PA-C

## 2024-10-07 ENCOUNTER — Ambulatory Visit

## 2024-10-07 VITALS — BP 135/81 | Ht 71.0 in | Wt 210.0 lb

## 2024-10-07 DIAGNOSIS — Z0000 Encounter for general adult medical examination without abnormal findings: Secondary | ICD-10-CM

## 2024-10-07 NOTE — Patient Instructions (Signed)
 Mr. Donald [name],  Thank you for taking the time for your Medicare Wellness Visit. I appreciate your continued commitment to your health goals. Please review the care plan we discussed, and feel free to reach out if I can assist you further.  Please note that Annual Wellness Visits do not include a physical exam. Some assessments may be limited, especially if the visit was conducted virtually. If needed, we may recommend an in-person follow-up with your provider.  Ongoing Care Seeing your primary care provider every 3 to 6 months helps us  monitor your health and provide consistent, personalized care.   Referrals If a referral was made during today's visit and you haven't received any updates within two weeks, please contact the referred provider directly to check on the status.  Recommended Screenings:  Health Maintenance  Topic Date Due   Medicare Annual Wellness Visit  Never done   Zoster (Shingles) Vaccine (1 of 2) Never done   Flu Shot  07/01/2024   COVID-19 Vaccine (3 - 2025-26 season) 08/01/2024   Colon Cancer Screening  12/01/2024   DTaP/Tdap/Td vaccine (1 - Tdap) 11/02/2024*   Hemoglobin A1C  11/17/2024   Yearly kidney health urinalysis for diabetes  02/20/2025   Yearly kidney function blood test for diabetes  05/18/2025   Complete foot exam   05/18/2025   Screening for Lung Cancer  06/16/2025   Eye exam for diabetics  07/06/2025   Pneumococcal Vaccine for age over 31  Completed   Hepatitis C Screening  Completed   HIV Screening  Completed   Hepatitis B Vaccine  Aged Out   HPV Vaccine  Aged Out   Meningitis B Vaccine  Aged Out  *Topic was postponed. The date shown is not the original due date.       10/07/2024    8:31 AM  Advanced Directives  Does Patient Have a Medical Advance Directive? No  Would patient like information on creating a medical advance directive? No - Patient declined    Vision: Annual vision screenings are recommended for early detection of glaucoma,  cataracts, and diabetic retinopathy. These exams can also reveal signs of chronic conditions such as diabetes and high blood pressure.  Dental: Annual dental screenings help detect early signs of oral cancer, gum disease, and other conditions linked to overall health, including heart disease and diabetes.  Please see the attached documents for additional preventive care recommendations.

## 2024-10-07 NOTE — Progress Notes (Signed)
 I connected with  Donald D Win Sr. on 10/07/24 by a audio enabled telemedicine application and verified that I am speaking with the correct person using two identifiers.  Patient Location: Home  Provider Location: Home Office  Persons Participating in Visit: Patient.  I discussed the limitations of evaluation and management by telemedicine. The patient expressed understanding and agreed to proceed.  Vital Signs: Because this visit was a virtual/telehealth visit, some criteria may be missing or patient reported. Any vitals not documented were not able to be obtained and vitals that have been documented are patient reported.  Because this visit was a virtual/telehealth visit,  certain criteria was not obtained, such a blood pressure, CBG if applicable, and timed get up and go. Any medications not marked as taking were not mentioned during the medication reconciliation part of the visit. Any vitals not documented were not able to be obtained due to this being a telehealth visit or patient was unable to self-report a recent blood pressure reading due to a lack of equipment at home via telehealth. Vitals that have been documented are verbally provided by the patient.   This visit was performed by a medical professional under my direct supervision. I was immediately available for consultation/collaboration. I have reviewed and agree with the Annual Wellness Visit documentation.   . Subjective:   Donald D Mikelson Sr. is a 58 y.o. male who presents for a Medicare Annual Wellness Visit.  Allergies (verified) No known allergies   History: Past Medical History:  Diagnosis Date   Abdominal pain, epigastric    Diabetes mellitus without complication (HCC)    Gout    R foot great toe    Hypercholesteremia 2017   Hypertension    Past Surgical History:  Procedure Laterality Date   ESOPHAGOGASTRODUODENOSCOPY (EGD) WITH PROPOFOL  N/A 05/26/2018   Procedure: ESOPHAGOGASTRODUODENOSCOPY (EGD) WITH  PROPOFOL ;  Surgeon: Janalyn Keene NOVAK, MD;  Location: [facility] ENDOSCOPY;  Service: Endoscopy;  Laterality: N/A;   KNEE SURGERY Right    Family History  Problem Relation Age of Onset   Hypertension Father    Diabetes Father    Liver disease Father    Diverticulitis Mother    Migraines Mother    Cancer Sister        tumors in the breast    Diabetes Maternal Grandfather    Cancer Sister        tumors in the breast    Cancer Sister        tumor in the breast    Social History   Occupational History   Not on file  Tobacco Use   Smoking status: Every Day    Current packs/day: 1.00    Average packs/day: 1 pack/day for 36.8 years (36.8 ttl pk-yrs)    Types: Cigarettes    Start date: 12/02/1987   Smokeless tobacco: Never   Tobacco comments:    Late 20's started smoking, most years 1 ppd and some times he cuts back to 1/2 ppd  Vaping Use   Vaping status: Never Used  Substance and Sexual Activity   Alcohol use: Not Currently    Alcohol/week: 3.0 standard drinks of alcohol    Types: 3 Cans of beer per week   Drug use: No   Sexual activity: Yes    Partners: Female   Tobacco Counseling Ready to quit: Not Answered Counseling given: Not Answered Tobacco comments: Late 20's started smoking, most years 1 ppd and some times he cuts back to 1/2 ppd  SDOH Screenings   Food Insecurity: No Food Insecurity (10/07/2024)  Housing: Low Risk  (10/07/2024)  Transportation Needs: No Transportation Needs (10/07/2024)  Utilities: Not At Risk (10/07/2024)  Alcohol Screen: Low Risk  (07/12/2020)  Depression (PHQ2-9): Low Risk  (10/07/2024)  Financial Resource Strain: High Risk (07/25/2021)  Physical Activity: Insufficiently Active (10/07/2024)  Social Connections: Moderately Isolated (10/07/2024)  Stress: No Stress Concern Present (10/07/2024)  Tobacco Use: High Risk (10/07/2024)  Health Literacy: Adequate Health Literacy (10/07/2024)   Depression Screen    10/07/2024    8:34 AM 05/18/2024    9:04 AM  11/03/2023   11:14 AM 07/14/2023    9:27 AM 06/16/2023    9:12 AM 10/09/2022    9:14 AM 07/17/2022    9:21 AM  PHQ 2/9 Scores  PHQ - 2 Score 0 0 0 0 0 0 0  PHQ- 9 Score 0 0   0  0  0  0      Data saved with a previous flowsheet row definition     Goals Addressed             This Visit's Progress    Monitor and Manage My Blood Sugar-Diabetes Type 2   On track    Timeframe:  Long-Range Goal Priority:  High Start Date:  01/23/2021                           Expected End Date: 10/26/2023                      Follow within one week   - check blood sugar once daily before breakfast - check blood sugar if I feel it is too high or too low - enter blood sugar readings and medication or insulin  into daily log    Why is this important?   Checking your blood sugar at home helps to keep it from getting very high or very low.  Writing the results in a diary or log helps the doctor know how to care for you.  Your blood sugar log should have the time, date and the results.  Also, write down the amount of insulin  or other medicine that you take.  Other information, like what you ate, exercise done and how you were feeling, will also be helpful.     Notes:      Pharmacy Goals   On track    The goal A1c is less than 7%. This is the best way to reduce the risk of the long term complications of diabetes, including heart disease, kidney disease, eye disease, strokes, and nerve damage. An A1c of less than 7% corresponds with fasting sugars less than 130 and 2 hour after meal sugars less than 180. Please check your blood sugar daily.  Please feel free to give me a call if you have any medication related questions or concerns in the future.  Sharyle Sia, PharmD, [facility] [phone]        Visit info / Clinical Intake: Medicare Wellness Visit Type:: Initial Annual Wellness Visit Medicare Wellness Visit Mode:: Telephone If telephone:: video declined If telephone  or video:: pt reported vitals Interpreter Needed?: No Pre-visit prep was completed: yes AWV questionnaire completed by patient prior to visit?: no Living arrangements:: lives with spouse/significant other Patient's Overall Health Status Rating: very good Typical amount of pain: none Does pain affect daily life?: no Are you currently prescribed opioids?:  no  Dietary Habits and Nutritional Risks How many meals a day?: 3 Eats fruit and vegetables daily?: yes Most meals are obtained by: preparing own meals; having others provide food; eating out In the last 2 weeks, have you had any of the following?: -- (n/a) Diabetic:: (!) yes Any non-healing wounds?: no How often do you check your BS?: as needed Would you like to be referred to a Nutritionist or for Diabetic Management? : no  Functional Status Activities of Daily Living (to include ambulation/medication): Independent Ambulation: Independent Medication Administration: Independent Home Management: Independent Manage your own finances?: yes Primary transportation is: driving Concerns about vision?: no *vision screening is required for WTM* Concerns about hearing?: no  Fall Screening Falls in the past year?: 0 Number of falls in past year: 0 Was there an injury with Fall?: 0 Fall Risk Category Calculator: 0 Patient Fall Risk Level: Low Fall Risk  Fall Risk Patient at Risk for Falls Due to: No Fall Risks Fall risk Follow up: Falls evaluation completed  Home and Transportation Safety: All rugs have non-skid backing?: N/A, no rugs All stairs or steps have railings?: N/A, no stairs Grab bars in the bathtub or shower?: yes Have non-skid surface in bathtub or shower?: yes Good home lighting?: yes Regular seat belt use?: yes Hospital stays in the last year:: no  Cognitive Assessment Difficulty concentrating, remembering, or making decisions? : no Will 6CIT or Mini Cog be Completed: no 6CIT or Mini Cog Declined: patient alert,  oriented, able to answer questions appropriately and recall recent events  Advance Directives (For Healthcare) Does Patient Have a Medical Advance Directive?: No Would patient like information on creating a medical advance directive?: No - Patient declined  Reviewed/Updated  Reviewed/Updated: All        Objective:    Today's Vitals   10/07/24 0829  BP: 135/81  Weight: 210 lb (95.3 kg)  Height: 5' 11 (1.803 m)   Body mass index is 29.29 kg/m.  Current Medications (verified) Outpatient Encounter Medications as of 10/07/2024  Medication Sig   aspirin  EC 81 MG EC tablet Take 1 tablet (81 mg total) by mouth daily.   BD PEN NEEDLE MICRO ULTRAFINE 32G X 6 MM MISC USE FOR INJECTION 1 TO 2 TIMES  DAILY   Blood Glucose Monitoring Suppl (D-CARE GLUCOMETER) w/Device KIT 1 Units by Does not apply route 4 (four) times daily -  before meals and at bedtime.   candesartan  (ATACAND ) 8 MG tablet TAKE 1 TABLET BY MOUTH DAILY   clopidogrel  (PLAVIX ) 75 MG tablet TAKE 1 TABLET BY MOUTH DAILY   LANTUS  SOLOSTAR 100 UNIT/ML Solostar Pen INJECT 20 TO 60 UNITS INTO THE  SKIN DAILY   metFORMIN  (GLUCOPHAGE ) 1000 MG tablet TAKE 1 TABLET BY MOUTH TWICE  DAILY WITH MEALS   rosuvastatin  (CRESTOR ) 40 MG tablet TAKE 1 TABLET BY MOUTH DAILY   Semaglutide , 1 MG/DOSE, (OZEMPIC , 1 MG/DOSE,) 4 MG/3ML SOPN Inject 1 mg into the skin once a week.   topiramate (TOPAMAX) 100 MG tablet Take 100 mg by mouth 2 (two) times daily.   No facility-administered encounter medications on file as of 10/07/2024.   Hearing/Vision screen Hearing Screening - Comments:: No difficulties Vision Screening - Comments:: No difficulties Immunizations and Health Maintenance Health Maintenance  Topic Date Due   Zoster Vaccines- Shingrix  (1 of 2) Never done   Influenza Vaccine  07/01/2024   COVID-19 Vaccine (3 - 2025-26 season) 08/01/2024   Colonoscopy  12/01/2024   DTaP/Tdap/Td (1 - Tdap)  11/02/2024 (Originally 06/14/1985)   HEMOGLOBIN  A1C  11/17/2024   Diabetic kidney evaluation - Urine ACR  02/20/2025   Diabetic kidney evaluation - eGFR measurement  05/18/2025   FOOT EXAM  05/18/2025   Lung Cancer Screening  06/16/2025   OPHTHALMOLOGY EXAM  07/06/2025   Medicare Annual Wellness (AWV)  10/07/2025   Pneumococcal Vaccine: 50+ Years  Completed   Hepatitis C Screening  Completed   HIV Screening  Completed   Hepatitis B Vaccines 19-59 Average Risk  Aged Out   HPV VACCINES  Aged Out   Meningococcal B Vaccine  Aged Out        Assessment/Plan:  This is a routine wellness examination for Donald [name].  Patient Care Team: Leavy Mole, PA-C as PCP - General (Family Medicine) Marea Selinda RAMAN, MD as Referring Physician (Vascular Surgery) Maree Jannett POUR, MD as Consulting Physician (Neurology)  I have personally reviewed and noted the following in the patient's chart:   Medical and social history Use of alcohol, tobacco or illicit drugs  Current medications and supplements including opioid prescriptions. Functional ability and status Nutritional status Physical activity Advanced directives List of other physicians Hospitalizations, surgeries, and ER visits in previous 12 months Vitals Screenings to include cognitive, depression, and falls Referrals and appointments  No orders of the defined types were placed in this encounter.  In addition, I have reviewed and discussed with patient certain preventive protocols, quality metrics, and best practice recommendations. A written personalized care plan for preventive services as well as general preventive health recommendations were provided to patient.   Lyle POUR Right, [place]   10/07/2024   Return in 1 year (on 10/07/2025).  After Visit Summary: (MyChart) Due to this being a telephonic visit, the after visit summary with patients personalized plan was offered to patient via MyChart   Nurse Notes: Nothing to report

## 2024-12-02 ENCOUNTER — Telehealth: Payer: Self-pay | Admitting: Family Medicine

## 2024-12-02 DIAGNOSIS — E669 Obesity, unspecified: Secondary | ICD-10-CM

## 2024-12-02 DIAGNOSIS — E119 Type 2 diabetes mellitus without complications: Secondary | ICD-10-CM

## 2024-12-02 DIAGNOSIS — E1169 Type 2 diabetes mellitus with other specified complication: Secondary | ICD-10-CM

## 2024-12-02 NOTE — Telephone Encounter (Signed)
clopidogrel (PLAVIX) 75 MG tablet

## 2024-12-02 NOTE — Telephone Encounter (Signed)
 Lvm asking pt to return call to schedule appt. Lvm letting pt know that Donald [name] is no longer in office but we can schedule him to see a different provider in office.

## 2024-12-05 MED ORDER — CANDESARTAN CILEXETIL 8 MG PO TABS: 8.0000 mg | ORAL_TABLET | Freq: Every day | ORAL | 0 refills | Status: DC

## 2024-12-05 MED ORDER — METFORMIN HCL 1000 MG PO TABS: 1000.0000 mg | ORAL_TABLET | Freq: Two times a day (BID) | ORAL | 0 refills | Status: DC

## 2024-12-05 MED ORDER — CLOPIDOGREL BISULFATE 75 MG PO TABS: 75.0000 mg | ORAL_TABLET | Freq: Every day | ORAL | 0 refills | Status: DC

## 2024-12-05 MED ORDER — ROSUVASTATIN CALCIUM 40 MG PO TABS: 40.0000 mg | ORAL_TABLET | Freq: Every day | ORAL | 0 refills | Status: DC

## 2024-12-05 MED ORDER — TOPIRAMATE 100 MG PO TABS: 100.0000 mg | ORAL_TABLET | Freq: Two times a day (BID) | ORAL | 0 refills | Status: DC

## 2024-12-05 NOTE — Telephone Encounter (Signed)
 Pt has appt sch'd for 01/16/25 (first availability). All his medications are on auto renew 29 da supply and will be out soon. Is it possible to refill the candesartan  8mg , clopidogrel  75mg , metformin  1000mg , rosuvastation 40mg , and topiramate  100mg 

## 2024-12-05 NOTE — Addendum Note (Signed)
 Addended by: YVONE WARREN BROCKS on: 12/05/2024 02:34 PM   Modules accepted: Orders

## 2025-01-04 ENCOUNTER — Other Ambulatory Visit: Payer: Self-pay | Admitting: Family Medicine

## 2025-01-04 DIAGNOSIS — E119 Type 2 diabetes mellitus without complications: Secondary | ICD-10-CM

## 2025-01-04 DIAGNOSIS — E1169 Type 2 diabetes mellitus with other specified complication: Secondary | ICD-10-CM

## 2025-01-04 DIAGNOSIS — E669 Obesity, unspecified: Secondary | ICD-10-CM

## 2025-01-04 NOTE — Telephone Encounter (Signed)
 Copied from CRM #[id number]. Topic: Clinical - Prescription Issue >> Jan 04, 2025  9:58 AM Donna BRAVO wrote: Reason for CRM: [facility] stated [facility] informed him the provider has not sent a new prescription for medication  Patient states he is running low on medication, when asked which medication needed refilled, he stated all of them. He would not say specifically which medication. Insisting we have the list.   Patient has a week of medication left.

## 2025-01-06 MED ORDER — METFORMIN HCL 1000 MG PO TABS: 1000.0000 mg | ORAL_TABLET | Freq: Two times a day (BID) | ORAL | 0 refills | Status: AC

## 2025-01-06 MED ORDER — TOPIRAMATE 100 MG PO TABS: 100.0000 mg | ORAL_TABLET | Freq: Two times a day (BID) | ORAL | 0 refills | Status: AC

## 2025-01-06 MED ORDER — CLOPIDOGREL BISULFATE 75 MG PO TABS: 75.0000 mg | ORAL_TABLET | Freq: Every day | ORAL | 0 refills | Status: AC

## 2025-01-06 MED ORDER — ROSUVASTATIN CALCIUM 40 MG PO TABS: 40.0000 mg | ORAL_TABLET | Freq: Every day | ORAL | 0 refills | Status: AC

## 2025-01-06 MED ORDER — CANDESARTAN CILEXETIL 8 MG PO TABS: 8.0000 mg | ORAL_TABLET | Freq: Every day | ORAL | 0 refills | Status: AC

## 2025-01-06 NOTE — Telephone Encounter (Signed)
 Sending to [facility] delivery.  Requested Prescriptions  Pending Prescriptions Disp Refills   topiramate  (TOPAMAX ) 100 MG tablet 180 tablet 0    Sig: Take 1 tablet (100 mg total) by mouth 2 (two) times daily.     Neurology: Anticonvulsants - topiramate  & zonisamide Passed - 01/06/2025  9:07 AM      Passed - Cr in normal range and within 360 days    Creat  Date Value Ref Range Status  05/18/2024 1.06 0.70 - 1.30 mg/dL Final   Creatinine, POC  Date Value Ref Range Status  07/31/2016 NEG mg/dL Final   Creatinine, Urine  Date Value Ref Range Status  06/16/2023 235 20 - 320 mg/dL Final         Passed - CO2 in normal range and within 360 days    CO2  Date Value Ref Range Status  05/18/2024 24 20 - 32 mmol/L Final         Passed - ALT in normal range and within 360 days    ALT  Date Value Ref Range Status  05/18/2024 13 9 - 46 U/L Final         Passed - AST in normal range and within 360 days    AST  Date Value Ref Range Status  05/18/2024 16 10 - 35 U/L Final         Passed - Completed PHQ-2 or PHQ-9 in the last 360 days      Passed - Valid encounter within last 12 months    Recent Outpatient Visits           7 months ago Uncontrolled type 2 diabetes mellitus with hyperglycemia [facility])   [place] [facility] Leavy Mole, PA-C   10 months ago Uncontrolled type 2 diabetes mellitus with hyperglycemia [facility])   [place] [facility] Leavy Mole, PA-C   11 months ago Hyperlipidemia associated with type 2 diabetes mellitus [facility])   [place] [facility] Tapia, Leisa, PA-C               clopidogrel  (PLAVIX ) 75 MG tablet 90 tablet 0    Sig: Take 1 tablet (75 mg total) by mouth daily.     Hematology: Antiplatelets - clopidogrel  Failed - 01/06/2025  9:07 AM      Failed - HCT in normal range and within 180 days    HCT  Date Value Ref Range Status  05/18/2024 36.2 (L) 38.5 - 50.0 % Final         Failed - HGB in normal range  and within 180 days    Hemoglobin  Date Value Ref Range Status  05/18/2024 11.2 (L) 13.2 - 17.1 g/dL Final         Failed - PLT in normal range and within 180 days    Platelets  Date Value Ref Range Status  05/18/2024 334 140 - 400 Thousand/uL Final         Passed - Cr in normal range and within 360 days    Creat  Date Value Ref Range Status  05/18/2024 1.06 0.70 - 1.30 mg/dL Final   Creatinine, POC  Date Value Ref Range Status  07/31/2016 NEG mg/dL Final   Creatinine, Urine  Date Value Ref Range Status  06/16/2023 235 20 - 320 mg/dL Final         Passed - Valid encounter within last 6 months    Recent Outpatient Visits  7 months ago Uncontrolled type 2 diabetes mellitus with hyperglycemia [facility])   [place] [facility] Leavy Mole, PA-C   10 months ago Uncontrolled type 2 diabetes mellitus with hyperglycemia [facility])   [place] [facility] Leavy Mole, PA-C   11 months ago Hyperlipidemia associated with type 2 diabetes mellitus [facility])   [place] [facility] Tapia, Leisa, PA-C               rosuvastatin  (CRESTOR ) 40 MG tablet 90 tablet 0    Sig: Take 1 tablet (40 mg total) by mouth daily.     Cardiovascular:  Antilipid - Statins 2 Failed - 01/06/2025  9:07 AM      Failed - Lipid Panel in normal range within the last 12 months    Cholesterol  Date Value Ref Range Status  11/03/2023 95 <200 mg/dL Final   LDL Cholesterol (Calc)  Date Value Ref Range Status  11/03/2023 37 mg/dL (calc) Final    Comment:    Reference range: <100 . Desirable range <100 mg/dL for primary prevention;   <70 mg/dL for patients with CHD or diabetic patients  with > or = 2 CHD risk factors. SABRA LDL-C is now calculated using the [name]-Hopkins  calculation, which is a validated novel method providing  better accuracy than the Friedewald equation in the  estimation of LDL-C.  Gladis APPLETHWAITE et al. SANDREA. 7986;689(80): 2061-2068   (http://education.[employer].com/faq/FAQ164)    HDL  Date Value Ref Range Status  11/03/2023 40 > OR = 40 mg/dL Final   Triglycerides  Date Value Ref Range Status  11/03/2023 95 <150 mg/dL Final         Passed - Cr in normal range and within 360 days    Creat  Date Value Ref Range Status  05/18/2024 1.06 0.70 - 1.30 mg/dL Final   Creatinine, POC  Date Value Ref Range Status  07/31/2016 NEG mg/dL Final   Creatinine, Urine  Date Value Ref Range Status  06/16/2023 235 20 - 320 mg/dL Final         Passed - Patient is not pregnant      Passed - Valid encounter within last 12 months    Recent Outpatient Visits           7 months ago Uncontrolled type 2 diabetes mellitus with hyperglycemia [facility])   [facility] Leavy Mole, PA-C   10 months ago Uncontrolled type 2 diabetes mellitus with hyperglycemia [facility])   [place] [facility] Leavy Mole, PA-C   11 months ago Hyperlipidemia associated with type 2 diabetes mellitus [facility])   [place] [facility] [place], Leisa, PA-C               candesartan  (ATACAND ) 8 MG tablet 90 tablet 0    Sig: Take 1 tablet (8 mg total) by mouth daily.     Cardiovascular:  Angiotensin Receptor Blockers - candesartan  cilexetil Failed - 01/06/2025  9:07 AM      Failed - K in normal range and within 180 days    Potassium  Date Value Ref Range Status  05/18/2024 4.2 3.5 - 5.3 mmol/L Final         Failed - Cr in normal range and within 180 days    Creat  Date Value Ref Range Status  05/18/2024 1.06 0.70 - 1.30 mg/dL Final   Creatinine, POC  Date Value Ref Range Status  07/31/2016 NEG mg/dL Final   Creatinine,  Urine  Date Value Ref Range Status  06/16/2023 235 20 - 320 mg/dL Final         Passed - AST in normal range and within 360 days    AST  Date Value Ref Range Status  05/18/2024 16 10 - 35 U/L Final         Passed - ALT in normal range and within 360 days     ALT  Date Value Ref Range Status  05/18/2024 13 9 - 46 U/L Final         Passed - Patient is not pregnant      Passed - Last BP in normal range    BP Readings from Last 1 Encounters:  10/07/24 135/81         Passed - Valid encounter within last 6 months    Recent Outpatient Visits           7 months ago Uncontrolled type 2 diabetes mellitus with hyperglycemia [facility])   [place] [facility] Leavy Mole, PA-C   10 months ago Uncontrolled type 2 diabetes mellitus with hyperglycemia [facility])   [place] [facility] Leavy Mole, PA-C   11 months ago Hyperlipidemia associated with type 2 diabetes mellitus [facility])   [place] [facility] [facility] Tapia, Leisa, PA-C               metFORMIN  (GLUCOPHAGE ) 1000 MG tablet 180 tablet 0    Sig: Take 1 tablet (1,000 mg total) by mouth 2 (two) times daily with a meal.     Endocrinology:  Diabetes - Biguanides Failed - 01/06/2025  9:07 AM      Failed - HBA1C is between 0 and 7.9 and within 180 days    Hemoglobin A1C  Date Value Ref Range Status  03/03/2023 11.3  Final   Hgb A1c MFr Bld  Date Value Ref Range Status  05/18/2024 6.9 (H) <5.7 % Final    Comment:    For someone without known diabetes, a hemoglobin A1c value of 6.5% or greater indicates that they may have  diabetes and this should be confirmed with a follow-up  test. . For someone with known diabetes, a value <7% indicates  that their diabetes is well controlled and a value  greater than or equal to 7% indicates suboptimal  control. A1c targets should be individualized based on  duration of diabetes, age, comorbid conditions, and  other considerations. . Currently, no consensus exists regarding use of hemoglobin A1c for diagnosis of diabetes for children. .          Failed - B12 Level in normal range and within 720 days    No results found for: VITAMINB12       Passed - Cr in normal range and within 360 days    Creat   Date Value Ref Range Status  05/18/2024 1.06 0.70 - 1.30 mg/dL Final   Creatinine, POC  Date Value Ref Range Status  07/31/2016 NEG mg/dL Final   Creatinine, Urine  Date Value Ref Range Status  06/16/2023 235 20 - 320 mg/dL Final         Passed - eGFR in normal range and within 360 days    GFR, Est African American  Date Value Ref Range Status  03/26/2021 76 > OR = 60 mL/min/1.74m2 Final   GFR, Est Non African American  Date Value Ref Range Status  03/26/2021 65 > OR = 60 mL/min/1.69m2 Final   eGFR  Date Value  Ref Range Status  05/18/2024 81 > OR = 60 mL/min/1.23m2 Final         Passed - Valid encounter within last 6 months    Recent Outpatient Visits           7 months ago Uncontrolled type 2 diabetes mellitus with hyperglycemia [facility])   [place] [facility] Leavy Mole, PA-C   10 months ago Uncontrolled type 2 diabetes mellitus with hyperglycemia [facility])   [place] [facility] Leavy Mole, PA-C   11 months ago Hyperlipidemia associated with type 2 diabetes mellitus [facility])   [place] [facility] [place], Mole, PA-C              Passed - CBC within normal limits and completed in the last 12 months    WBC  Date Value Ref Range Status  05/18/2024 9.5 3.8 - 10.8 Thousand/uL Final   RBC  Date Value Ref Range Status  05/18/2024 3.79 (L) 4.20 - 5.80 Million/uL Final   Hemoglobin  Date Value Ref Range Status  05/18/2024 11.2 (L) 13.2 - 17.1 g/dL Final   HCT  Date Value Ref Range Status  05/18/2024 36.2 (L) 38.5 - 50.0 % Final   MCHC  Date Value Ref Range Status  05/18/2024 30.9 (L) 32.0 - 36.0 g/dL Final    Comment:    For adults, a slight decrease in the calculated MCHC value (in the range of 30 to 32 g/dL) is most likely not clinically significant; however, it should be interpreted with caution in correlation with other red cell parameters and the patient's clinical condition.    [facility]  Date Value  Ref Range Status  05/18/2024 29.6 27.0 - 33.0 pg Final   MCV  Date Value Ref Range Status  05/18/2024 95.5 80.0 - 100.0 fL Final   No results found for: PLTCOUNTKUC, LABPLAT, POCPLA RDW  Date Value Ref Range Status  05/18/2024 15.1 (H) 11.0 - 15.0 % Final

## 2025-01-16 ENCOUNTER — Encounter: Admitting: Family Medicine

## 2025-08-01 ENCOUNTER — Ambulatory Visit (INDEPENDENT_AMBULATORY_CARE_PROVIDER_SITE_OTHER): Admitting: Vascular Surgery

## 2025-08-01 ENCOUNTER — Encounter (INDEPENDENT_AMBULATORY_CARE_PROVIDER_SITE_OTHER)
